# Patient Record
Sex: Male | Born: 2000 | Race: Black or African American | Hispanic: No | Marital: Single | Smoking: Never smoker
Health system: Southern US, Community
[De-identification: ages and names within clinical notes are randomized; demographics above are authoritative.]

## PROBLEM LIST (undated history)

## (undated) VITALS — BP 128/73 | HR 80 | Temp 98.5°F | Resp 16 | Ht 70.0 in | Wt 214.0 lb

## (undated) DIAGNOSIS — F209 Schizophrenia, unspecified: Secondary | ICD-10-CM

## (undated) DIAGNOSIS — R011 Cardiac murmur, unspecified: Secondary | ICD-10-CM

## (undated) HISTORY — DX: Cardiac murmur, unspecified: R01.1

---
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
## 2019-08-06 ENCOUNTER — Emergency Department (HOSPITAL_COMMUNITY)
Admission: EM | Admit: 2019-08-06 | Discharge: 2019-08-08 | Disposition: A | Discharge status: Other Acute Inpatient Hospital | Payer: BC Managed Care – PPO | Attending: Emergency Medicine | Admitting: Emergency Medicine

## 2019-08-06 DIAGNOSIS — Z20828 Contact with and (suspected) exposure to other viral communicable diseases: Secondary | ICD-10-CM | POA: Insufficient documentation

## 2019-08-06 DIAGNOSIS — F3113 Bipolar disorder, current episode manic without psychotic features, severe: Secondary | ICD-10-CM | POA: Insufficient documentation

## 2019-08-06 DIAGNOSIS — R4689 Other symptoms and signs involving appearance and behavior: Secondary | ICD-10-CM | POA: Diagnosis not present

## 2019-08-06 DIAGNOSIS — Z79899 Other long term (current) drug therapy: Secondary | ICD-10-CM | POA: Insufficient documentation

## 2019-08-06 DIAGNOSIS — R45850 Homicidal ideations: Secondary | ICD-10-CM | POA: Diagnosis not present

## 2019-08-06 DIAGNOSIS — R456 Violent behavior: Secondary | ICD-10-CM | POA: Diagnosis present

## 2019-08-06 DIAGNOSIS — Z046 Encounter for general psychiatric examination, requested by authority: Secondary | ICD-10-CM | POA: Insufficient documentation

## 2019-08-06 DIAGNOSIS — F3181 Bipolar II disorder: Secondary | ICD-10-CM | POA: Diagnosis not present

## 2019-08-06 MED ORDER — ACETAMINOPHEN 325 MG PO TABS: 650.0000 mg | ORAL_TABLET | Freq: Four times a day (QID) | ORAL | Status: DC | PRN

## 2019-08-06 MED ORDER — LORAZEPAM 1 MG PO TABS
1.0000 mg | ORAL_TABLET | ORAL | Status: DC | PRN
  Administered 2019-08-07 – 2019-08-08 (×2): 1 mg via ORAL
  Filled 2019-08-06 (×2): qty 1

## 2019-08-06 MED ORDER — LORAZEPAM 2 MG/ML IJ SOLN
2.0000 mg | Freq: Once | INTRAMUSCULAR | Status: AC
  Administered 2019-08-06: 19:00:00 2 mg via INTRAMUSCULAR
  Filled 2019-08-06: qty 1

## 2019-08-06 MED ORDER — LORAZEPAM 2 MG/ML IJ SOLN: 1.0000 mg | Freq: Once | INTRAMUSCULAR | Status: DC
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
## 2019-08-07 DIAGNOSIS — F3181 Bipolar II disorder: Secondary | ICD-10-CM | POA: Diagnosis present

## 2019-08-07 LAB — CBC WITH DIFFERENTIAL/PLATELET
Abs Immature Granulocytes: 0 10*3/uL (ref 0.00–0.07)
Basophils Absolute: 0.1 10*3/uL (ref 0.0–0.1)
Basophils Relative: 1 %
Eosinophils Absolute: 0.2 10*3/uL (ref 0.0–0.5)
Eosinophils Relative: 5 %
HCT: 48 % (ref 39.0–52.0)
Hemoglobin: 15.4 g/dL (ref 13.0–17.0)
Immature Granulocytes: 0 %
Lymphocytes Relative: 42 %
Lymphs Abs: 2.1 10*3/uL (ref 0.7–4.0)
MCH: 30.3 pg (ref 26.0–34.0)
MCHC: 32.1 g/dL (ref 30.0–36.0)
MCV: 94.3 fL (ref 80.0–100.0)
Monocytes Absolute: 0.4 10*3/uL (ref 0.1–1.0)
Monocytes Relative: 9 %
Neutro Abs: 2.2 10*3/uL (ref 1.7–7.7)
Neutrophils Relative %: 43 %
Platelets: 191 10*3/uL (ref 150–400)
RBC: 5.09 MIL/uL (ref 4.22–5.81)
RDW: 12.3 % (ref 11.5–15.5)
WBC: 5 10*3/uL (ref 4.0–10.5)
nRBC: 0 % (ref 0.0–0.2)

## 2019-08-07 LAB — COMPREHENSIVE METABOLIC PANEL
ALT: 11 U/L (ref 0–44)
AST: 22 U/L (ref 15–41)
Albumin: 4.5 g/dL (ref 3.5–5.0)
Alkaline Phosphatase: 76 U/L (ref 38–126)
Anion gap: 6 (ref 5–15)
BUN: 9 mg/dL (ref 6–20)
CO2: 27 mmol/L (ref 22–32)
Calcium: 9.3 mg/dL (ref 8.9–10.3)
Chloride: 106 mmol/L (ref 98–111)
Creatinine, Ser: 0.87 mg/dL (ref 0.61–1.24)
GFR calc Af Amer: >60 mL/min (ref >60)
GFR calc non Af Amer: >60 mL/min (ref >60)
Glucose, Bld: 84 mg/dL (ref 70–99)
Potassium: 4 mmol/L (ref 3.5–5.1)
Sodium: 139 mmol/L (ref 135–145)
Total Bilirubin: 0.3 mg/dL (ref 0.3–1.2)
Total Protein: 7.2 g/dL (ref 6.5–8.1)

## 2019-08-07 LAB — SARS CORONAVIRUS 2 BY RT PCR (HOSPITAL ORDER, PERFORMED IN ~~LOC~~ HOSPITAL LAB): SARS Coronavirus 2: NEGATIVE

## 2019-08-07 LAB — RAPID URINE DRUG SCREEN, HOSP PERFORMED
Amphetamines: NOT DETECTED
Barbiturates: NOT DETECTED
Benzodiazepines: POSITIVE — AB
Cocaine: NOT DETECTED
Opiates: NOT DETECTED
Tetrahydrocannabinol: NOT DETECTED

## 2019-08-07 LAB — ETHANOL: Alcohol, Ethyl (B): <10 mg/dL (ref <10)

## 2019-08-07 NOTE — ED Notes (Signed)
Pt calmer, more relaxed, speech less pressured.

## 2019-08-07 NOTE — ED Notes (Signed)
Up in room pacing, agreed to take ativan to help him relax

## 2019-08-07 NOTE — ED Notes (Addendum)
Watching tv, nad, sitter present

## 2019-08-07 NOTE — ED Notes (Signed)
Pt was transferred to TCU due to low census.

## 2019-08-07 NOTE — ED Notes (Signed)
Pt ambulatory w/o difficulty to room 35, wanded PTA, blongings in locker 35

## 2019-08-07 NOTE — ED Notes (Signed)
Report on Situation, Background, Assessment, and Recommendations received from [place], [place]. Patient alert and in no visible distress. Patient made aware of 1:1 and security cameras for their safety. Patient instructed to come to me with needs or concerns.

## 2019-08-07 NOTE — ED Notes (Signed)
telepsych eval via
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
## 2019-08-07 NOTE — ED Notes (Signed)
Up to the bathroom 
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
## 2019-08-07 NOTE — Progress Notes (Signed)
Received Mario Thomas last PM at the change of shift with the sitter at the bedside. He was restless, intrusive and had a difficult time following directions.He was focused on going home and had a difficult understanding the IVC policy. He eventually drifted off to sleep and slept til 0300 hrs. He immediately resumed the same behavior before he went to sleep. He was given coloring pages with crayons and a crossword book. The items kept him occupied for briefs periods of time. His mother called last night and this morning.
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
## 2019-08-07 NOTE — ED Notes (Addendum)
Pt's mom contact number--Mesi [phone].

## 2019-08-07 NOTE — ED Notes (Signed)
Talking w/. [employer] officer 

## 2019-08-08 DIAGNOSIS — R4689 Other symptoms and signs involving appearance and behavior: Secondary | ICD-10-CM | POA: Insufficient documentation

## 2019-08-08 DIAGNOSIS — F3181 Bipolar II disorder: Secondary | ICD-10-CM

## 2019-08-08 NOTE — ED Notes (Signed)
Called report to [facility] and spoke with Games developer.  Called [facility] and requested transport. Left message because there was not an answer.

## 2019-08-08 NOTE — ED Notes (Signed)
Called report to [facility] and spoke to [employer]. Called [facility] for transport.

## 2019-08-08 NOTE — Progress Notes (Addendum)
Patient has been accepted at [facility] per [place]. He will be going to their [facility]. Accepting provider is Dr. Marylu Lund, MD. Number to call report is [phone]. Patient can arrive after 13:00. CSW notified Diane, RN regarding disposition.  Chalmers Guest. Guerry Bruin, MSW, [place] Work/Disposition Phone: [phone] Fax: [phone]

## 2019-08-08 NOTE — ED Notes (Signed)
Transported to [facility] by [facility]. One hospital bag of pt belongings given to the sheriff. Pt was calm and cooperative at the time of discharge.

## 2019-08-08 NOTE — ED Notes (Signed)
Sheriff called and said that transport will be here in about 1-1/2 hours.
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
## 2019-08-08 NOTE — Progress Notes (Signed)
Patient meets criteria for inpatient treatment. No appropriate or available beds at [facility]. CSW faxed referrals to the following facilities for review:  [facility]  [facility]  [facility]  [facility]   TTS will continue to seek bed placement.  Mario Thomas. Mario Thomas, MSW, [place] Work/Disposition Phone: [phone] Fax: [phone]

## 2019-08-08 NOTE — ED Notes (Signed)
Pt refused to have VS taken

## 2019-08-20 ENCOUNTER — Telehealth (HOSPITAL_COMMUNITY): Payer: Self-pay | Admitting: Professional

## 2019-11-27 ENCOUNTER — Encounter: Payer: Self-pay | Admitting: Family Medicine

## 2019-11-27 ENCOUNTER — Other Ambulatory Visit: Payer: Self-pay

## 2019-11-27 ENCOUNTER — Ambulatory Visit (INDEPENDENT_AMBULATORY_CARE_PROVIDER_SITE_OTHER): Payer: BC Managed Care – PPO | Admitting: Family Medicine

## 2019-11-27 VITALS — BP 138/82 | HR 72 | Temp 97.2°F | Ht 70.0 in | Wt 154.4 lb

## 2019-11-27 DIAGNOSIS — Z0001 Encounter for general adult medical examination with abnormal findings: Secondary | ICD-10-CM

## 2019-11-27 DIAGNOSIS — H6123 Impacted cerumen, bilateral: Secondary | ICD-10-CM

## 2019-11-27 DIAGNOSIS — Z23 Encounter for immunization: Secondary | ICD-10-CM

## 2019-11-27 DIAGNOSIS — Z00129 Encounter for routine child health examination without abnormal findings: Secondary | ICD-10-CM

## 2019-11-27 DIAGNOSIS — Z003 Encounter for examination for adolescent development state: Secondary | ICD-10-CM

## 2019-11-27 DIAGNOSIS — Z8659 Personal history of other mental and behavioral disorders: Secondary | ICD-10-CM

## 2019-11-27 NOTE — Patient Instructions (Addendum)
May use cotton ball soaked in mineral oil into ear 10-20 min per week if having recurrent ear wax accumulation. May use dilute hydrogen peroxide (equal parts this and water) and place a few drops into ear every 2 weeks to help break down wax buildup.   Preventive Care 34-19 Years Old, Male Preventive care refers to lifestyle choices and visits with your health care provider that can promote health and wellness. At this stage in your life, you may start seeing a primary care physician instead of a pediatrician. Your health care is now your responsibility. Preventive care for young adults includes:  A yearly physical exam. This is also called an annual wellness visit.  Regular dental and eye exams.  Immunizations.  Screening for certain conditions.  Healthy lifestyle choices, such as diet and exercise. What can I expect for my preventive care visit? Physical exam Your health care provider may check:  Height and weight. These may be used to calculate body mass index (BMI), which is a measurement that tells if you are at a healthy weight.  Heart rate and blood pressure.  Body temperature. Counseling Your health care provider may ask you questions about:  Past medical problems and family medical history.  Alcohol, tobacco, and drug use.  Home and relationship well-being.  Access to firearms.  Emotional well-being.  Diet, exercise, and sleep habits.  Sexual activity and sexual health. What immunizations do I need?  Influenza (flu) vaccine  This is recommended every year. Tetanus, diphtheria, and pertussis (Tdap) vaccine  You may need a Td booster every 10 years. Varicella (chickenpox) vaccine  You may need this vaccine if you have not already been vaccinated. Human papillomavirus (HPV) vaccine  If recommended by your health care provider, you may need three doses over 6 months. Measles, mumps, and rubella (MMR) vaccine  You may need at least one dose of MMR. You may  also need a second dose. Meningococcal conjugate (MenACWY) vaccine  One dose is recommended if you are 59-86 years old and a Market researcher living in a residence hall, or if you have one of several medical conditions. You may also need additional booster doses. Pneumococcal conjugate (PCV13) vaccine  You may need this if you have certain conditions and were not previously vaccinated. Pneumococcal polysaccharide (PPSV23) vaccine  You may need one or two doses if you smoke cigarettes or if you have certain conditions. Hepatitis A vaccine  You may need this if you have certain conditions or if you travel or work in places where you may be exposed to hepatitis A. Hepatitis B vaccine  You may need this if you have certain conditions or if you travel or work in places where you may be exposed to hepatitis B. Haemophilus influenzae type b (Hib) vaccine  You may need this if you have certain risk factors. You may receive vaccines as individual doses or as more than one vaccine together in one shot (combination vaccines). Talk with your health care provider about the risks and benefits of combination vaccines. What tests do I need? Blood tests  Lipid and cholesterol levels. These may be checked every 5 years starting at age 28.  Hepatitis C test.  Hepatitis B test. Screening  Genital exam to check for testicular cancer or hernias.  Sexually transmitted disease (STD) testing, if you are at risk. Other tests  Tuberculosis skin test.  Vision and hearing tests.  Skin exam. Follow these instructions at home: Eating and drinking   Eat a  diet that includes fresh fruits and vegetables, whole grains, lean protein, and low-fat dairy products.  Drink enough fluid to keep your urine pale yellow.  Do not drink alcohol if: ? Your health care provider tells you not to drink. ? You are under the legal drinking age. In the [place] the legal drinking age is 42.  If you drink  alcohol: ? Limit how much you have to 0-2 drinks a day. ? Be aware of how much alcohol is in your drink. In the [place] one drink equals one 12 oz bottle of beer (355 mL), one 5 oz glass of wine (148 mL), or one 1 oz glass of hard liquor (44 mL). Lifestyle  Take daily care of your teeth and gums.  Stay active. Exercise at least 30 minutes 5 or more days of the week.  Do not use any products that contain nicotine or tobacco, such as cigarettes, e-cigarettes, and chewing tobacco. If you need help quitting, ask your health care provider.  Do not use drugs.  If you are sexually active, practice safe sex. Use a condom or other form of protection to prevent STIs (sexually transmitted infections).  Find healthy ways to cope with stress, such as: ? Meditation, yoga, or listening to music. ? Journaling. ? Talking to a trusted person. ? Spending time with friends and family. Safety  Always wear your seat belt while driving or riding in a vehicle.  Do not drive if you have been drinking alcohol.  Do not ride with someone who has been drinking.  Do not drive when you are tired or distracted.  Do not text while driving.  Wear a helmet and other protective equipment during sports activities.  If you have firearms in your house, make sure you follow all gun safety procedures.  Seek help if you have been bullied, physically abused, or sexually abused.  Use the Internet responsibly to avoid dangers such as online bullying and online sex predators. What's next?  Go to your health care provider once a year for a well check visit.  Ask your health care provider how often you should have your eyes and teeth checked.  Stay up to date on all vaccines. This information is not intended to replace advice given to you by your health care provider. Make sure you discuss any questions you have with your health care provider. Document Released: 04/12/2016 Document Revised: 11/20/2018 Document  Reviewed: 11/20/2018 Elsevier Patient Education  2020 [employer].

## 2019-11-27 NOTE — Progress Notes (Signed)
Subjective:    Patient ID: Mario Thomas, male    DOB: [birth year], 18 y.o.   MRN: [id number]  HPI This is a 18 yo male who presents today for annual wellness visit. Needs paperwork completed for [facility]. Will be living on campus. Living at home. Things are going ok. Exercising at home more, reading more.  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  Last CPE- 08/04/2018 Tdap- 04/04/2012 Flu- today Dental- last year Eye- yes Exercise- as above Sexuality- never sexually active  Mood- better, watching movies, reading, journaling about goals.  Reports that he is getting along well with his parents.  Denies using any recreational drugs, tobacco products, alcohol.  He is previously on Lamictal and hydroxyzine following behavioral health admission.  He reports that he has come off all of his medications and that his mood has been good.  Clicking in jaws with drinking water.   Some tingling sensation with lifting weights. Most in upper body, across back. No weakness. Goes away with stretching.     Past Medical History:  Diagnosis Date  . Heart murmur    History reviewed. No pertinent surgical history. History reviewed. No pertinent family history. Social History   Tobacco Use  . Smoking status: Never Smoker  . Smokeless tobacco: Never Used  Substance Use Topics  . Alcohol use: Never  . Drug use: Never     Review of Systems  Constitutional: Negative.   HENT:       Per HPI, clicking along jaw line with drinking water. No pain.   Eyes: Negative.   Respiratory: Negative.   Endocrine: Negative.   Genitourinary: Negative.   Musculoskeletal: Positive for myalgias (see hpi).  Skin: Negative.   Allergic/Immunologic: Negative.   Neurological: Negative.   Hematological: Negative.   Psychiatric/Behavioral: Negative.        Objective:   Physical Exam Vitals reviewed.  Constitutional:      General: He is not in acute distress.    Appearance: Normal appearance. He  is normal weight. He is not ill-appearing, toxic-appearing or diaphoretic.  HENT:     Head: Normocephalic and atraumatic.     Comments: No abnormality of TMJ joint or cervical lymphadenopathy.  No crepitus with opening of mouth.    Right Ear: Tympanic membrane, ear canal and external ear normal.     Left Ear: Tympanic membrane, ear canal and external ear normal.     Ears:     Comments: Moderate amounts of cerumen bilaterally.    Nose: Nose normal.     Mouth/Throat:     Mouth: Mucous membranes are moist.     Pharynx: Oropharynx is clear.  Eyes:     Conjunctiva/sclera: Conjunctivae normal.  Cardiovascular:     Rate and Rhythm: Normal rate.     Heart sounds: Normal heart sounds.  Pulmonary:     Effort: Pulmonary effort is normal.     Breath sounds: Normal breath sounds.  Abdominal:     General: Abdomen is flat. Bowel sounds are normal. There is no distension.     Palpations: Abdomen is soft. There is no mass.     Tenderness: There is no abdominal tenderness. There is no guarding or rebound.     Hernia: No hernia is present.  Musculoskeletal:        General: No swelling, tenderness, deformity or signs of injury. Normal range of motion.     Right lower leg: No edema.     Left lower leg:  No edema.  Skin:    General: Skin is warm and dry.  Neurological:     Mental Status: He is alert and oriented to person, place, and time.  Psychiatric:        Mood and Affect: Mood normal.        Behavior: Behavior normal.        Thought Content: Thought content normal.        Judgment: Judgment normal.       BP 138/82 (BP Location: Left Arm, Patient Position: Sitting, Cuff Size: Normal)   Pulse 72   Temp (!) 97.2 F (36.2 C) (Temporal)   Ht 5\' 10"  (1.778 m)   Wt 154 lb 6.4 oz (70 kg)   SpO2 98%   BMI 22.15 kg/m  Wt Readings from Last 3 Encounters:  11/27/19 154 lb 6.4 oz (70 kg) (52 %, Z= 0.05)*  08/22/18 145 lb 11.6 oz (66.1 kg) (46 %, Z= -0.09)*  08/04/18 144 lb 8 oz (65.5 kg) (45  %, Z= -0.14)*   * Growth percentiles are based on CDC (Boys, 2-20 Years) data.   Depression screen [facility] 2/9 11/27/2019 08/04/2018  Decreased Interest 0 0  Down, Depressed, Hopeless 0 0  PHQ - 2 Score 0 0       Assessment & Plan:  1. Well adolescent visit - Discussed and encouraged healthy lifestyle choices- adequate sleep, regular exercise, stress management and healthy food choices.    2. Need for influenza vaccination - Flu Vaccine QUAD 6+ mos PF IM (Fluarix Quad PF)  3. Excessive cerumen in both ear canals - provided information regarding measures to soften and remove cerumen  4. History of behavioral and mental health problems -Patient has come off his medication and reports that his mood has been stable.  Discussed resources for him if he feels like his mood deteriorates or he has difficulty sleeping or coping.  This visit occurred during the SARS-CoV-2 public health emergency.  Safety protocols were in place, including screening questions prior to the visit, additional usage of staff PPE, and extensive cleaning of exam room while observing appropriate contact time as indicated for disinfecting solutions.    08/06/2018, FNP-BC  [facility], [facility]  11/27/2019 5:32 PM

## 2021-01-16 ENCOUNTER — Encounter: Payer: BC Managed Care – PPO | Admitting: Family Medicine

## 2021-01-23 ENCOUNTER — Encounter: Payer: Self-pay | Admitting: Family Medicine

## 2021-01-23 ENCOUNTER — Other Ambulatory Visit: Payer: Self-pay

## 2021-01-23 ENCOUNTER — Ambulatory Visit (INDEPENDENT_AMBULATORY_CARE_PROVIDER_SITE_OTHER): Payer: 59 | Admitting: Family Medicine

## 2021-01-23 VITALS — BP 118/70 | HR 68 | Temp 98.7°F | Ht 70.0 in | Wt 179.8 lb

## 2021-01-23 DIAGNOSIS — Z23 Encounter for immunization: Secondary | ICD-10-CM | POA: Diagnosis not present

## 2021-01-23 DIAGNOSIS — Z0000 Encounter for general adult medical examination without abnormal findings: Secondary | ICD-10-CM

## 2021-01-23 NOTE — Progress Notes (Signed)
Annual Exam   Chief Complaint:  Chief Complaint  Patient presents with  . Transitions Of Care    History of Present Illness:  Mario Thomas is a 20 y.o. presents today for annual examination.    #Abnormal throat sound - feels he sounds like a hum - unable to reproduce the sound - daily occurrences - about 20 times per day - occurring at random times - has not been able to find any triggers  Nutrition/Lifestyle Exercise: daily running  Diet: meat, veggies, grains He is not sexually active.   Social History   Tobacco Use  Smoking Status Never Smoker  Smokeless Tobacco Never Used   Social History   Substance and Sexual Activity  Alcohol Use Never   Social History   Substance and Sexual Activity  Drug Use Never     Safety The patient wears seatbelts: yes.     The patient feels safe at home and in their relationships: yes.  General Health Dentist in the last year: No Eye doctor: not applicable  Weight Wt Readings from Last 3 Encounters:  01/23/21 179 lb 12 oz (81.5 kg)  11/27/19 154 lb 6.4 oz (70 kg) (52 %, Z= 0.05)*  08/22/18 145 lb 11.6 oz (66.1 kg) (46 %, Z= -0.09)*   * Growth percentiles are based on CDC (Boys, 2-20 Years) data.   Patient has normal BMI  BMI Readings from Last 1 Encounters:  01/23/21 25.79 kg/m     Chronic disease screening Blood pressure monitoring:  BP Readings from Last 3 Encounters:  01/23/21 118/70  11/27/19 138/82  08/08/19 134/75    Lipid Monitoring: Indication for screening: age >35, obesity, diabetes, family hx, CV risk factors.  Lipid screening: No  Lab Results  Component Value Date   CHOL 113 08/24/2018   HDL 47 08/24/2018   LDLCALC 58 08/24/2018   TRIG 41 08/24/2018   CHOLHDL 2.4 08/24/2018     Diabetes Screening: age >59, overweight, family hx, PCOS, hx of gestational diabetes, at risk ethnicity, elevated blood pressure >135/80.  Diabetes Screening screening: No  Lab Results  Component Value Date    HGBA1C 5.0 08/24/2018     Immunization History  Administered Date(s) Administered  . HPV Quadrivalent 04/04/2012, 05/09/2012  . Hepatitis A, Ped/Adol-2 Dose 09/23/2009, 05/25/2010  . Hepatitis B, ped/adol 09/23/2009, 10/31/2009, 05/25/2010  . IPV 05/02/2009, 09/23/2009, 10/31/2009  . Influenza Split 10/31/2009, 01/18/2012  . Influenza,inj,Quad PF,6+ Mos 01/18/2012, 11/27/2019  . MMR 05/02/2009, 12/21/2009  . Meningococcal B, OMV 08/04/2018  . Meningococcal Polysaccharide 04/04/2012  . Td 10/31/2009, 12/21/2009, 06/30/2010  . Tdap 04/04/2012  . Varicella 09/23/2009, 12/21/2009    Past Medical History:  Diagnosis Date  . Heart murmur   . 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 No pertinent surgical history.  Prior to Admission medications   Not on File    No Known Allergies   Social History   Socioeconomic History  . Marital status: Single    Spouse name: Not on file  . Number of children: Not on file  . Years of education: Not on file  . Highest education level: Not on file  Occupational History  . Not on file  Tobacco Use  . Smoking status: Never Smoker  . Smokeless tobacco: Never Used  Vaping Use  . Vaping Use: Never used  Substance and Sexual Activity  . Alcohol use: Never  . Drug use: Never  . Sexual activity: Never  Other Topics Concern  .  Not on file  Social History Narrative   01/23/21   From: the area   Living: with parents   Work: Primary school teacher: Estate manager/land agent at [employer]: good relationship with parents, 1 sibling      Enjoys: running      Exercise: daily running    Diet: meat, veggies, grains      Safety   Seat belts: Yes    Guns: No   Safe in relationships: Yes    Social Determinants of Radio broadcast assistant Strain: Not on file  Food Insecurity: Not on file  Transportation Needs: Not on file  Physical Activity: Not on file  Stress: Not on file  Social Connections:  Not on file  Intimate Partner Violence: Not on file    Family History  Problem Relation Age of Onset  . Hyperlipidemia Father     Review of Systems  Constitutional: Negative for chills and fever.  HENT: Negative for congestion and sore throat.   Eyes: Negative for blurred vision and double vision.  Respiratory: Negative for shortness of breath.   Cardiovascular: Negative for chest pain.  Gastrointestinal: Negative for heartburn, nausea and vomiting.  Genitourinary: Negative.   Musculoskeletal: Negative.  Negative for myalgias.  Skin: Negative for rash.  Neurological: Negative for dizziness and headaches.  Endo/Heme/Allergies: Does not bruise/bleed easily.  Psychiatric/Behavioral: Negative for depression. The patient is not nervous/anxious.      Physical Exam BP 118/70   Pulse 68   Temp 98.7 F (37.1 C) (Temporal)   Ht 5' 10" (1.778 m)   Wt 179 lb 12 oz (81.5 kg)   SpO2 99%   BMI 25.79 kg/m    BP Readings from Last 3 Encounters:  01/23/21 118/70  11/27/19 138/82  08/08/19 134/75      Physical Exam Constitutional:      General: He is not in acute distress.    Appearance: He is well-developed and well-nourished. He is not diaphoretic.  HENT:     Head: Normocephalic and atraumatic.     Right Ear: Tympanic membrane and ear canal normal.     Left Ear: Tympanic membrane and ear canal normal.     Nose: Nose normal.     Mouth/Throat:     Mouth: Oropharynx is clear and moist.     Pharynx: Uvula midline.  Eyes:     General: No scleral icterus.    Extraocular Movements: EOM normal.     Conjunctiva/sclera: Conjunctivae normal.     Pupils: Pupils are equal, round, and reactive to light.  Cardiovascular:     Rate and Rhythm: Normal rate and regular rhythm.     Heart sounds: Murmur heard.    Pulmonary:     Effort: Pulmonary effort is normal. No respiratory distress.     Breath sounds: Normal breath sounds. No wheezing.  Abdominal:     General: Bowel sounds are  normal. There is no distension.     Palpations: Abdomen is soft. There is no mass.     Tenderness: There is no abdominal tenderness. There is no guarding.  Musculoskeletal:        General: Normal range of motion.     Cervical back: Normal range of motion and neck supple.  Lymphadenopathy:     Cervical: No cervical adenopathy.  Skin:    General: Skin is warm and dry.     Capillary Refill: Capillary refill takes less than 2 seconds.  Neurological:  Mental Status: He is alert and oriented to person, place, and time.  Psychiatric:        Mood and Affect: Mood and affect normal.        Results:  PHQ-9:   Depression screen [facility] 2/9 11/27/2019 08/04/2018  Decreased Interest 0 0  Down, Depressed, Hopeless 0 0  PHQ - 2 Score 0 0       Assessment: 20 y.o. here for routine annual physical examination.  Plan: Problem List Items Addressed This Visit   None     Screening: -- Blood pressure screen normal -- cholesterol screening: not due for screening -- Weight screening: normal -- Diabetes Screening: not due for screening -- Nutrition: Encouraged healthy diet and exercise  The ASCVD Risk score Mikey Bussing DC Jr., et al., 2013) failed to calculate for the following reasons:   The 2013 ASCVD risk score is only valid for ages 23 to 15  -- Statin therapy for Age 79-75 with CVD risk >7.5%  Psych -- Depression screening (PHQ-9):    Safety -- tobacco screening: not using -- alcohol screening:  low-risk usage. -- no evidence of domestic violence or intimate partner violence.   Cancer Screening -- No age related cancer screening due  Immunizations Immunization History  Administered Date(s) Administered  . HPV Quadrivalent 04/04/2012, 05/09/2012  . Hepatitis A, Ped/Adol-2 Dose 09/23/2009, 05/25/2010  . Hepatitis B, ped/adol 09/23/2009, 10/31/2009, 05/25/2010  . IPV 05/02/2009, 09/23/2009, 10/31/2009  . Influenza Split 10/31/2009, 01/18/2012  . Influenza,inj,Quad PF,6+ Mos  01/18/2012, 11/27/2019  . MMR 05/02/2009, 12/21/2009  . Meningococcal B, OMV 08/04/2018  . Meningococcal Polysaccharide 04/04/2012  . Td 10/31/2009, 12/21/2009, 06/30/2010  . Tdap 04/04/2012  . Varicella 09/23/2009, 12/21/2009    -- flu vaccine up to date -- TDAP q10 years up to date -- Covid-19 Vaccine up to date   Encouraged regular vision and dental screening. Encouraged healthy exercise and diet.   Lesleigh Noe

## 2021-01-23 NOTE — Patient Instructions (Addendum)
Hum symptom: could try allergy pill - zyrtec, Claritin - store brand OK for 1-2 weeks to see if it helps

## 2021-11-06 ENCOUNTER — Ambulatory Visit (HOSPITAL_COMMUNITY)
Admission: EM | Admit: 2021-11-06 | Discharge: 2021-11-06 | Disposition: A | Discharge status: Home / Self Care | Payer: 59 | Attending: Registered Nurse | Admitting: Registered Nurse

## 2021-11-06 ENCOUNTER — Encounter (HOSPITAL_COMMUNITY): Payer: Self-pay | Admitting: Registered Nurse

## 2021-11-06 DIAGNOSIS — Z046 Encounter for general psychiatric examination, requested by authority: Secondary | ICD-10-CM

## 2021-11-06 DIAGNOSIS — F3181 Bipolar II disorder: Secondary | ICD-10-CM | POA: Diagnosis present
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
## 2021-11-06 NOTE — Progress Notes (Signed)
AVS reviewed with patient and all questions answered.  Patient verbalized understanding of information presented.  Patient requesting ride to [facility] on [address] where he said he's been staying.  Safe transport here to transport patient home.  All belongings returned and belongings sheet signed.  Patient discharged in stable condition; no acute distress noted.

## 2021-11-06 NOTE — ED Provider Notes (Signed)
Behavioral Health Urgent Care Medical Screening Exam  Patient Name: Mario Thomas MRN: [id number] Date of Evaluation: 11/06/21 Chief Complaint:   Diagnosis:  Final diagnoses:  Involuntary commitment  Bipolar 2 disorder ([place])    History of Present illness: Mario Thomas is a 20 y.o. male. patient presented to [facility] as a walk in via law enforcement under IVC petition by his father.  Per IVC "Respondent has been previously diagnosed with bipolar , schizophrenia.  He has medication but is non-compliant with his medication regimen.  He has a his tory of mental commitments most recently in 2020.  According to family, respondent had been actin erratically, spending all of his money, staying in expensive hotel even though he has a home to stay at.  He is spending all of his money as he believes that he will win the lottery playing power ball.  He has been driving his parents vehicle without their permission and he has lost his wallet and cell phone so there Is no means of communicating with him.  Parents are concerned for his well being as he continues to act erratically and refuses to take his mediations.  Respondent has been telling his parents that he hopes his actions will cause parents to get fired from their jobs."    Mario Thomas, 20 y.o., male patient seen face to face by this provider, consulted with Dr. Ernie Hew; and chart reviewed on 11/06/21.  On evaluation Mario Thomas reports he feels fine.  Patient denies suicidal/self-harm/homicidal ideation, psychosis, and paranoia.  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  States he has been staying in a motel "I work hard for my money and I just wanted to be somewhere to relax."  Patient states he has stopped taking his psychotropic medications "I felt better and didn't feel that I needed them."  Patient states since he has stopped taking him medication over a month ago he feels even better.  Patient states he is eating and sleeping  without difficulty.  He denies racing/intrusive thought, and mood swings.  Patient also denies substance use.  Patient has no outpatient psychiatric services and that he was getting psychotropic medications prescribed by his primary care provider.  Reports that he does live with parents but they try to control him and sometimes he just wants to be alone to relax."  At this time patient is not interested in outpatient psychiatric services.  Patient gave permission to speak to his mother or father for collateral information but was unable to give the phone numbers an doesn't have his cell phone to obtain the numbers.   During evaluation Mario Thomas is sitting up right in chair in no acute distress.  He is alert/oriented x 4; calm/cooperative; and mood congruent with affect.  He is speaking in a clear tone at moderate volume, and normal pace; with good eye contact.  His thought process is coherent and relevant; There is no indication that he is currently responding to internal/external stimuli or experiencing delusional thought content; and he has denied suicidal/self-harm/homicidal ideation, psychosis, and paranoia.  Patient has remained calm throughout assessment and has answered questions appropriately.    Collateral Information:  Spoke to patients mother and father (Mario Thomas and Mario Thomas).  Both report that patient has been staying in a motel.  "5 days prior to Thanksgiving.  He came home Thanksgiving day and then went back to motel.  Reports he was paying for motel with his own money  until he lost his wallet with debt card and now he is asking [place] for money."  States that the patient has stopped taking his medications and needs to get back on them.  Reports that the patient hasn't been a danger to himself or anyone else but he is not making logical decisions.  Informed could not force patient to take his medications.  Although staying in a motel room when have a home to stay at is not logical but not  grounds for inpatient psychiatric treatment.        Demographic Factors:  Male  Loss Factors: None  Historical Factors: Prior psychiatric treatment  Risk Reduction Factors:   Sense of responsibility to family, Religious beliefs about death, Employed, Living with another person, especially a relative, and Positive social support  Continued Clinical Symptoms:  Previous Psychiatric Diagnoses and Treatments  Cognitive Features That Contribute To Risk:  None    Suicide Risk:  Minimal: No identifiable suicidal ideation.  Patients presenting with no risk factors but with morbid ruminations; may be classified as minimal risk based on the severity of the depressive symptoms  Rescind IVC: After thorough evaluation and review of information currently presented on assessment of Mario Thomas, there is insufficient findings to indicate patient meets criteria for involuntary commitment or require an inpatient level of care. Mario Thomas is alert/oriented x 4; calm/cooperative; and mood congruent with affect.  He is speaking in a clear tone at moderate volume at a normal pace, with good eye contact.  His thought process is coherent and relevant; There is no indication that he is currently responding to internal/external stimuli or experiencing delusional thought content; and he has denied suicidal/self-harm/homicidal ideation, psychosis, and paranoia.  He has remained calm throughout assessment and has answered questions appropriately.  At this time, He is not significantly impaired, psychotic, or manic on exam.  A detailed risk assessment has been completed based on clinical exam and individual risk factors.  Patient acute suicide risk is low.  Psychiatric Specialty Exam: Physical Exam Vitals and nursing note reviewed. Exam conducted with a chaperone present.  Constitutional:      General: He is not in acute distress.    Appearance: Normal appearance. He is not ill-appearing.  Cardiovascular:     Rate  and Rhythm: Normal rate.  Pulmonary:     Effort: Pulmonary effort is normal.  Musculoskeletal:        General: Normal range of motion.     Cervical back: Normal range of motion.  Skin:    General: Skin is warm and dry.  Neurological:     Mental Status: He is alert and oriented to person, place, and time.  Psychiatric:        Attention and Perception: Attention and perception normal. He does not perceive auditory or visual hallucinations.        Mood and Affect: Mood and affect normal.        Speech: Speech normal.        Behavior: Behavior normal. Behavior is cooperative.        Thought Content: Thought content normal. Thought content is not paranoid or delusional. Thought content does not include homicidal or suicidal ideation.        Cognition and Memory: Cognition and memory normal.        Judgment: Judgment normal.    Review of Systems  Constitutional: Negative.   HENT: Negative.    Eyes: Negative.   Respiratory: Negative.    Cardiovascular: Negative.  Gastrointestinal: Negative.   Genitourinary: Negative.   Musculoskeletal: Negative.   Skin: Negative.   Neurological: Negative.   Psychiatric/Behavioral:  Depression: Denies. Hallucinations: Denies. Substance abuse: Denies. Suicidal ideas: Denies. Nervous/anxious: Denies. Insomnia: Denies.    Blood pressure 135/89, pulse 83, temperature 98.8 F (37.1 C), temperature source Oral, resp. rate 18, SpO2 100 %.There is no height or weight on file to calculate BMI.    Presentation  General Appearance:Appropriate for Environment; Casual  Eye Contact:Good  Speech:Clear and Coherent; Normal Rate  Speech Volume:Normal  Handedness:Right   Mood and Affect  Mood:Euthymic  Affect:Appropriate; Congruent   Thought Process  Thought Processes:Coherent; Goal Directed  Descriptions of Associations:Intact  Orientation:Full (Time, Place and Person)  Thought Content:Logical; WDL  Diagnosis of Schizophrenia or Schizoaffective  disorder in past: No   Hallucinations:None  Ideas of Reference:None  Suicidal Thoughts:No  Homicidal Thoughts:No   Sensorium  Memory:Immediate Good; Recent Good  Judgment:Intact  Insight:Present   Executive Functions  Concentration:No data recorded Attention Span:Good  [place]  Language:Good   Psychomotor Activity  Psychomotor Activity:Normal   Assets  Assets:Communication Skills; Desire for Improvement; Financial Resources/Insurance; Housing; Data processing manager; Transportation   Sleep  Sleep:Good  Number of hours: No data recorded  Nutritional Assessment (For OBS and FBC admissions only) Has the patient had a decrease in food intake/or appetite?: No Does the patient have dental problems?: No Does the patient have eating habits or behaviors that may be indicators of an eating disorder including binging or inducing vomiting?: No Has the patient recently lost weight without trying?: 0 Has the patient been eating poorly because of a decreased appetite?: 0 Malnutrition Screening Tool Score: 0    Physical Exam: Physical Exam Vitals and nursing note reviewed. Exam conducted with a chaperone present.  Constitutional:      General: He is not in acute distress.    Appearance: Normal appearance. He is not ill-appearing.  Cardiovascular:     Rate and Rhythm: Normal rate.  Pulmonary:     Effort: Pulmonary effort is normal.  Musculoskeletal:        General: Normal range of motion.     Cervical back: Normal range of motion.  Skin:    General: Skin is warm and dry.  Neurological:     Mental Status: He is alert and oriented to person, place, and time.  Psychiatric:        Attention and Perception: Attention and perception normal. He does not perceive auditory or visual hallucinations.        Mood and Affect: Mood and affect normal.        Speech: Speech normal.        Behavior: Behavior normal. Behavior is cooperative.        Thought  Content: Thought content normal. Thought content is not paranoid or delusional. Thought content does not include homicidal or suicidal ideation.        Cognition and Memory: Cognition and memory normal.        Judgment: Judgment normal.   Review of Systems  Constitutional: Negative.   HENT: Negative.    Eyes: Negative.   Respiratory: Negative.    Cardiovascular: Negative.   Gastrointestinal: Negative.   Genitourinary: Negative.   Musculoskeletal: Negative.   Skin: Negative.   Neurological: Negative.   Endo/Heme/Allergies: Negative.   Psychiatric/Behavioral:  Depression: Denies. Hallucinations: Denies. Substance abuse: Denies. Suicidal ideas: Denies. Nervous/anxious: Denies. Insomnia: Denies.   Blood pressure 135/89, pulse 83, temperature 98.8 F (37.1 C), temperature  source Oral, resp. rate 18, SpO2 100 %. There is no height or weight on file to calculate BMI.  Musculoskeletal: Strength & Muscle Tone: within normal limits Gait & Station: normal Patient leans: N/A   [facility] MSE Discharge Disposition for Follow up and Recommendations: Based on my evaluation the patient does not appear to have an emergency medical condition and can be discharged with resources and follow up care in outpatient services for Medication Management and Individual Therapy   Maddalynn Barnard, NP 11/06/2021, 2:42 PM
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
## 2021-11-08 ENCOUNTER — Other Ambulatory Visit: Payer: Self-pay

## 2021-11-08 ENCOUNTER — Encounter (HOSPITAL_COMMUNITY): Payer: Self-pay

## 2021-11-08 ENCOUNTER — Emergency Department (HOSPITAL_COMMUNITY)
Admission: EM | Admit: 2021-11-08 | Discharge: 2021-11-09 | Disposition: A | Discharge status: Other Acute Inpatient Hospital | Payer: 59 | Source: Home / Self Care | Attending: Emergency Medicine | Admitting: Emergency Medicine

## 2021-11-08 DIAGNOSIS — Z20822 Contact with and (suspected) exposure to covid-19: Secondary | ICD-10-CM | POA: Insufficient documentation

## 2021-11-08 DIAGNOSIS — F209 Schizophrenia, unspecified: Secondary | ICD-10-CM | POA: Insufficient documentation

## 2021-11-08 DIAGNOSIS — F332 Major depressive disorder, recurrent severe without psychotic features: Secondary | ICD-10-CM | POA: Insufficient documentation

## 2021-11-08 DIAGNOSIS — F312 Bipolar disorder, current episode manic severe with psychotic features: Secondary | ICD-10-CM | POA: Diagnosis not present

## 2021-11-08 DIAGNOSIS — R456 Violent behavior: Secondary | ICD-10-CM | POA: Insufficient documentation

## 2021-11-08 DIAGNOSIS — R4689 Other symptoms and signs involving appearance and behavior: Secondary | ICD-10-CM

## 2021-11-08 DIAGNOSIS — F319 Bipolar disorder, unspecified: Secondary | ICD-10-CM | POA: Insufficient documentation

## 2021-11-08 DIAGNOSIS — Z046 Encounter for general psychiatric examination, requested by authority: Secondary | ICD-10-CM | POA: Insufficient documentation

## 2021-11-08 DIAGNOSIS — R4182 Altered mental status, unspecified: Secondary | ICD-10-CM | POA: Insufficient documentation

## 2021-11-08 LAB — COMPREHENSIVE METABOLIC PANEL
ALT: 13 U/L (ref 0–44)
AST: 19 U/L (ref 15–41)
Albumin: 4.3 g/dL (ref 3.5–5.0)
Alkaline Phosphatase: 53 U/L (ref 38–126)
Anion gap: 7 (ref 5–15)
BUN: 16 mg/dL (ref 6–20)
CO2: 28 mmol/L (ref 22–32)
Calcium: 9.2 mg/dL (ref 8.9–10.3)
Chloride: 105 mmol/L (ref 98–111)
Creatinine, Ser: 0.67 mg/dL (ref 0.61–1.24)
GFR, Estimated: >60 mL/min (ref >60)
Glucose, Bld: 92 mg/dL (ref 70–99)
Potassium: 3.7 mmol/L (ref 3.5–5.1)
Sodium: 140 mmol/L (ref 135–145)
Total Bilirubin: 0.5 mg/dL (ref 0.3–1.2)
Total Protein: 7.4 g/dL (ref 6.5–8.1)

## 2021-11-08 LAB — CBC
HCT: 46.2 % (ref 39.0–52.0)
Hemoglobin: 15.3 g/dL (ref 13.0–17.0)
MCH: 30.3 pg (ref 26.0–34.0)
MCHC: 33.1 g/dL (ref 30.0–36.0)
MCV: 91.5 fL (ref 80.0–100.0)
Platelets: 181 10*3/uL (ref 150–400)
RBC: 5.05 MIL/uL (ref 4.22–5.81)
RDW: 12.2 % (ref 11.5–15.5)
WBC: 3.5 10*3/uL — ABNORMAL LOW (ref 4.0–10.5)
nRBC: 0 % (ref 0.0–0.2)

## 2021-11-08 LAB — SALICYLATE LEVEL: Salicylate Lvl: <7 mg/dL — ABNORMAL LOW (ref 7.0–30.0)

## 2021-11-08 LAB — ETHANOL: Alcohol, Ethyl (B): <10 mg/dL (ref <10)

## 2021-11-08 LAB — ACETAMINOPHEN LEVEL: Acetaminophen (Tylenol), Serum: <10 ug/mL — ABNORMAL LOW (ref 10–30)

## 2021-11-08 MED ORDER — LORAZEPAM 1 MG PO TABS
1.0000 mg | ORAL_TABLET | ORAL | Status: DC | PRN
  Administered 2021-11-09: 1 mg via ORAL
  Filled 2021-11-08 (×2): qty 1

## 2021-11-08 MED ORDER — ARIPIPRAZOLE 10 MG PO TABS
10.0000 mg | ORAL_TABLET | Freq: Every day | ORAL | Status: DC
  Filled 2021-11-08: qty 1

## 2021-11-08 MED ORDER — BENZTROPINE MESYLATE 1 MG PO TABS
1.0000 mg | ORAL_TABLET | Freq: Every day | ORAL | Status: DC
  Filled 2021-11-08: qty 1
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
## 2021-11-08 NOTE — ED Provider Notes (Signed)
Emergency Medicine Provider Triage Evaluation Note  Mario Thomas , a 20 y.o. male  was evaluated in triage.  Pt presents under IVC by his parents due to hallucinations and abnormal behavior.  History of bipolar, schizophrenia.  Review of Systems  Positive: None Negative: SI/HI, AVH  Physical Exam  BP 140/71 (BP Location: Left Arm)   Pulse 87   Temp 98.4 F (36.9 C) (Oral)   Resp 18   SpO2 94%  Gen:   Awake, no distress   Resp:  Normal effort  MSK:   Moves extremities without difficulty  Other:  Flat affect, responsive to questions.  Does not appear to be responding to internal stimuli.  Medical Decision Making  Medically screening exam initiated at 5:31 PM.  Appropriate orders placed.  Mario Thomas was informed that the remainder of the evaluation will be completed by another provider, this initial triage assessment does not replace that evaluation, and the importance of remaining in the ED until their evaluation is complete.     Saddie Benders, PA-C 11/08/21 1740    Charlynne Pander, MD 11/08/21 [id number]
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
## 2021-11-09 ENCOUNTER — Emergency Department (HOSPITAL_COMMUNITY): Payer: 59

## 2021-11-09 ENCOUNTER — Encounter (HOSPITAL_COMMUNITY): Payer: Self-pay | Admitting: Psychiatry

## 2021-11-09 ENCOUNTER — Inpatient Hospital Stay (HOSPITAL_COMMUNITY)
Admission: AD | Admit: 2021-11-09 | Discharge: 2021-11-17 | DRG: 885 | Disposition: A | Discharge status: Home / Self Care | Payer: 59 | Source: Intra-hospital | Attending: Emergency Medicine | Admitting: Emergency Medicine

## 2021-11-09 ENCOUNTER — Other Ambulatory Visit: Payer: Self-pay

## 2021-11-09 DIAGNOSIS — F312 Bipolar disorder, current episode manic severe with psychotic features: Principal | ICD-10-CM | POA: Diagnosis present

## 2021-11-09 DIAGNOSIS — Z79899 Other long term (current) drug therapy: Secondary | ICD-10-CM

## 2021-11-09 DIAGNOSIS — F319 Bipolar disorder, unspecified: Secondary | ICD-10-CM | POA: Diagnosis present

## 2021-11-09 DIAGNOSIS — Z20822 Contact with and (suspected) exposure to covid-19: Secondary | ICD-10-CM | POA: Diagnosis present

## 2021-11-09 LAB — RAPID URINE DRUG SCREEN, HOSP PERFORMED
Amphetamines: NOT DETECTED
Barbiturates: NOT DETECTED
Benzodiazepines: NOT DETECTED
Cocaine: NOT DETECTED
Opiates: NOT DETECTED
Tetrahydrocannabinol: NOT DETECTED

## 2021-11-09 LAB — RESP PANEL BY RT-PCR (FLU A&B, COVID) ARPGX2
Influenza A by PCR: NEGATIVE
Influenza B by PCR: NEGATIVE
SARS Coronavirus 2 by RT PCR: NEGATIVE

## 2021-11-09 MED ORDER — LORAZEPAM 1 MG PO TABS: 1.0000 mg | ORAL_TABLET | ORAL | Status: DC | PRN

## 2021-11-09 MED ORDER — BENZTROPINE MESYLATE 1 MG PO TABS
1.0000 mg | ORAL_TABLET | Freq: Every day | ORAL | Status: DC
  Administered 2021-11-09 – 2021-11-16 (×8): 1 mg via ORAL
  Filled 2021-11-09 (×10): qty 1

## 2021-11-09 MED ORDER — ZIPRASIDONE MESYLATE 20 MG IM SOLR: 20.0000 mg | Freq: Four times a day (QID) | INTRAMUSCULAR | Status: DC | PRN

## 2021-11-09 MED ORDER — ACETAMINOPHEN 325 MG PO TABS: 650.0000 mg | ORAL_TABLET | Freq: Four times a day (QID) | ORAL | Status: DC | PRN

## 2021-11-09 MED ORDER — ALUM & MAG HYDROXIDE-SIMETH 200-200-20 MG/5ML PO SUSP: 30.0000 mL | ORAL | Status: DC | PRN

## 2021-11-09 MED ORDER — ARIPIPRAZOLE 10 MG PO TABS
10.0000 mg | ORAL_TABLET | Freq: Every day | ORAL | Status: DC
  Administered 2021-11-09 – 2021-11-10 (×2): 10 mg via ORAL
  Filled 2021-11-09 (×5): qty 1

## 2021-11-09 MED ORDER — RISPERIDONE 2 MG PO TBDP: 2.0000 mg | ORAL_TABLET | Freq: Three times a day (TID) | ORAL | Status: DC | PRN

## 2021-11-09 MED ORDER — MAGNESIUM HYDROXIDE 400 MG/5ML PO SUSP: 30.0000 mL | Freq: Every day | ORAL | Status: DC | PRN

## 2021-11-09 MED ORDER — HYDROXYZINE HCL 25 MG PO TABS: 25.0000 mg | ORAL_TABLET | Freq: Three times a day (TID) | ORAL | Status: DC | PRN

## 2021-11-09 MED ORDER — ZIPRASIDONE MESYLATE 20 MG IM SOLR: 20.0000 mg | INTRAMUSCULAR | Status: DC | PRN

## 2021-11-09 MED ORDER — TRAZODONE HCL 50 MG PO TABS
50.0000 mg | ORAL_TABLET | Freq: Every evening | ORAL | Status: DC | PRN
  Administered 2021-11-09 – 2021-11-16 (×8): 50 mg via ORAL
  Filled 2021-11-09 (×8): qty 1

## 2021-11-09 NOTE — ED Notes (Signed)
Patient refused medications stating, "I'm not taking that."

## 2021-11-09 NOTE — Progress Notes (Signed)
Pt kept to himself much of the evening    11/09/21 2300  Psych Admission Type (Psych Patients Only)  Admission Status Involuntary  Psychosocial Assessment  Patient Complaints Anxiety  Eye Contact Fair  Facial Expression Flat;Pensive  Affect Apprehensive;Appropriate to circumstance  Speech Logical/coherent  Interaction Assertive  Motor Activity Slow  Appearance/Hygiene In scrubs  Behavior Characteristics Cooperative  Mood Anxious;Preoccupied  Thought Process  Coherency Concrete thinking  Content Blaming others  Delusions Persecutory  Perception WDL  Hallucination None reported or observed  Judgment Poor  Confusion None  Danger to Self  Current suicidal ideation? Denies  Danger to Others  Danger to Others None reported or observed

## 2021-11-09 NOTE — ED Notes (Signed)
Patient is paranoid with bizarre behavior.  He laughs inappropriately and has mood swings.  He makes demands of staff including, "I want a coke now.  I better leave soon."  Security at bedside as show of support.

## 2021-11-09 NOTE — BHH Group Notes (Signed)
PT was informed but did not attend group. ?
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
## 2021-11-09 NOTE — ED Notes (Signed)
Pt refused CT scan at this time. Provider made aware. Pt taken back to his room.

## 2021-11-09 NOTE — ED Notes (Signed)
Patient off unit to [facility] per provider. Patient alert, calm, cooperative, no s/s of distress. DC information given to [employer] for transport. Patient ambulatory off unit, escorted and transported by [employer].

## 2021-11-09 NOTE — ED Provider Notes (Signed)
Emergency Medicine Observation Re-evaluation Note  Mario Thomas is a 20 y.o. male, seen on rounds today.  Pt initially presented to the ED for complaints of Schizophrenia Currently, the patient is watching TV.  Physical Exam  BP 130/80 (BP Location: Left Arm)   Pulse 67   Temp 98.3 F (36.8 C) (Oral)   Resp 20   SpO2 99%  Physical Exam General: nad Cardiac: rrr Lungs: clear Psych: psychotic  ED Course / MDM  EKG:   I have reviewed the labs performed to date as well as medications administered while in observation.  Recent changes in the last 24 hours include none.  Plan  Current plan is for inpt criteria met and looking for placement. Mario Thomas is under involuntary commitment.      Mario Dessert, MD 11/09/21 1055

## 2021-11-09 NOTE — Tx Team (Cosign Needed)
Initial Treatment Plan 11/09/2021 6:14 PM Mario Thomas [id number]    PATIENT STRESSORS: Educational concerns   Health problems   Medication change or noncompliance   Traumatic event     PATIENT STRENGTHS: Ability for insight  Average or above average intelligence  Communication skills  Supportive family/friends  Work skills    PATIENT IDENTIFIED PROBLEMS: anxiety  Family conflict  homelessness                 DISCHARGE CRITERIA:  Ability to meet basic life and health needs Improved stabilization in mood, thinking, and/or behavior Motivation to continue treatment in a less acute level of care Need for constant or close observation no longer present  PRELIMINARY DISCHARGE PLAN: Attend aftercare/continuing care group Outpatient therapy Placement in alternative living arrangements Return to previous work or school arrangements  PATIENT/FAMILY INVOLVEMENT: This treatment plan has been presented to and reviewed with the patient, Mario Thomas.  The patient and family have been given the opportunity to ask questions and make suggestions.  Raylene Miyamoto, RN 11/09/2021, 6:14 PM

## 2021-11-09 NOTE — Progress Notes (Signed)
Consulted with care team at previous facility. No belongings found that pt stated they were brought in with.

## 2021-11-09 NOTE — Progress Notes (Addendum)
Pt had a string necklace with metal cross that was removed by Clinical research associate and placed in his locker and noted on his belongings sheet

## 2021-11-09 NOTE — BH Assessment (Signed)
[facility] Assessment Progress Note   Per Elta Guadeloupe, PMHNP, this pt requires psychiatric hospitalization.  Danika, RN, [facility] has tentatively assigned pt to [facility] Rm 502-1 to the service of Dr Sherron Flemings, pending negative Covid-19 results and a urine sample being obtained for UDS.  Pt presents under IVC initiated by pt's mother and upheld by EDP Chaney Malling, MD and IVC documents have been sent to [facility].  EDP Gwyneth Sprout, MD and pt's nurse, Waynetta Sandy, have been notified, and Waynetta Sandy agrees to call report to [phone].  Pt is to be transported via Patent examiner when the time comes.     Doylene Canning, [place] [facility] Coordinator [phone]
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
## 2021-11-10 ENCOUNTER — Telehealth (HOSPITAL_COMMUNITY): Payer: Self-pay | Admitting: Family Medicine

## 2021-11-10 ENCOUNTER — Encounter (HOSPITAL_COMMUNITY): Payer: Self-pay

## 2021-11-10 DIAGNOSIS — F319 Bipolar disorder, unspecified: Secondary | ICD-10-CM
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
## 2021-11-10 NOTE — Plan of Care (Signed)
  Problem: Education: Goal: Ability to state activities that reduce stress will improve Outcome: Progressing   Problem: Coping: Goal: Ability to identify and develop effective coping behavior will improve Outcome: Progressing   Problem: Self-Concept: Goal: Ability to identify factors that promote anxiety will improve Outcome: Progressing   

## 2021-11-10 NOTE — BH Assessment (Signed)
Care Management - Follow Up Discharges   Patient has been placed in an inpatient psychiatric hospital ([facility]) on 11-09-2021.   
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
## 2021-11-10 NOTE — Group Note (Signed)
LCSW Group Therapy Note   1:15pm   Type of Therapy and Topic: Group Therapy: Holding on to Grudges    Participation Level: None     Description of Group:  In this group patients will be asked to explore and define a grudge. Patients will be guided to discuss their thoughts, feelings, and reasons as to why people have grudges. Patients will process the impact grudges have on daily life and identify thoughts and feelings related to holding grudges. Facilitator will challenge patients to identify ways to let go of grudges and the benefits this provides. Patients will be confronted to address why one struggles letting go of grudges. Lastly, patients will identify feelings and thoughts related to what life would look like without grudges. This group will be process-oriented, with patients participating in exploration of their own experiences, giving and receiving support, and processing challenge from other group members.   Therapeutic Goals:  1. Patient will identify specific grudges related to their personal life.  2. Patient will identify feelings, thoughts, and beliefs around grudges.  3. Patient will identify how one releases grudges appropriately.  4. Patient will identify situations where they could have let go of the grudge, but instead chose to hold on.    Summary of Patient Progress: Pt attended group, however did not participate      Therapeutic Modalities:  Cognitive Behavioral Therapy  Solution Focused Therapy  Motivational Interviewing  Brief Therapy     Brendalyn Vallely MSW, LCSW Clincal Social Worker  [facility]          

## 2021-11-10 NOTE — Progress Notes (Signed)
Pt visible on the unit some this evening, pt stated he talked to his dad and got a visit from him this evening. Pt stated he was feeling better    11/10/21 2100  Psych Admission Type (Psych Patients Only)  Admission Status Involuntary  Psychosocial Assessment  Patient Complaints Anxiety  Eye Contact Fair  Facial Expression Anxious  Affect Apprehensive;Appropriate to circumstance  Speech Logical/coherent  Interaction Assertive  Motor Activity Slow  Appearance/Hygiene In hospital gown  Behavior Characteristics Cooperative  Mood Anxious;Pleasant  Thought Process  Coherency Concrete thinking  Content Blaming others  Delusions None reported or observed  Perception WDL  Hallucination None reported or observed  Judgment Poor  Confusion None  Danger to Self  Current suicidal ideation? Denies  Danger to Others  Danger to Others None reported or observed

## 2021-11-10 NOTE — BH IP Treatment Plan (Signed)
Interdisciplinary Treatment and Diagnostic Plan Update  11/10/2021 Mario Thomas MRN: [id number]  Principal Diagnosis: Bipolar 1 disorder ([place])  Secondary Diagnoses: Principal Problem:   Bipolar 1 disorder ([place])   Current Medications:  Current Facility-Administered Medications  Medication Dose Route Frequency Provider Last Rate Last Admin   acetaminophen (TYLENOL) tablet 650 mg  650 mg Oral Q6H PRN Ethelene Hal, NP       alum & mag hydroxide-simeth (MAALOX/MYLANTA) 200-200-20 MG/5ML suspension 30 mL  30 mL Oral Q4H PRN Ethelene Hal, NP       ARIPiprazole (ABILIFY) tablet 10 mg  10 mg Oral QHS Ethelene Hal, NP   10 mg at 11/09/21 2034   benztropine (COGENTIN) tablet 1 mg  1 mg Oral QHS Ethelene Hal, NP   1 mg at 11/09/21 2034   hydrOXYzine (ATARAX) tablet 25 mg  25 mg Oral TID PRN Ethelene Hal, NP       risperiDONE (RISPERDAL M-TABS) disintegrating tablet 2 mg  2 mg Oral Q8H PRN Ethelene Hal, NP       And   LORazepam (ATIVAN) tablet 1 mg  1 mg Oral PRN Ethelene Hal, NP       And   ziprasidone (GEODON) injection 20 mg  20 mg Intramuscular PRN Ethelene Hal, NP       magnesium hydroxide (MILK OF MAGNESIA) suspension 30 mL  30 mL Oral Daily PRN Ethelene Hal, NP       traZODone (DESYREL) tablet 50 mg  50 mg Oral QHS PRN Ethelene Hal, NP   50 mg at 11/09/21 2034   PTA Medications: Medications Prior to Admission  Medication Sig Dispense Refill Last Dose   ARIPiprazole (ABILIFY) 10 MG tablet Take 10 mg by mouth at bedtime. (Patient not taking: Reported on 11/08/2021)      benztropine (COGENTIN) 1 MG tablet Take 1 mg by mouth at bedtime. (Patient not taking: Reported on 11/08/2021)       Patient Stressors: Educational concerns   Health problems   Medication change or noncompliance   Traumatic event    Patient Strengths: Ability for insight  Average or above average intelligence  Communication skills   Supportive family/friends  Work skills   Treatment Modalities: Medication Management, Group therapy, Case management,  1 to 1 session with clinician, Psychoeducation, Recreational therapy.   Physician Treatment Plan for Primary Diagnosis: Bipolar 1 disorder ([place]) Long Term Goal(s):     Short Term Goals:    Medication Management: Evaluate patient's response, side effects, and tolerance of medication regimen.  Therapeutic Interventions: 1 to 1 sessions, Unit Group sessions and Medication administration.  Evaluation of Outcomes: Not Met  Physician Treatment Plan for Secondary Diagnosis: Principal Problem:   Bipolar 1 disorder ([place])  Long Term Goal(s):     Short Term Goals:       Medication Management: Evaluate patient's response, side effects, and tolerance of medication regimen.  Therapeutic Interventions: 1 to 1 sessions, Unit Group sessions and Medication administration.  Evaluation of Outcomes: Not Met   RN Treatment Plan for Primary Diagnosis: Bipolar 1 disorder ([place]) Long Term Goal(s): Knowledge of disease and therapeutic regimen to maintain health will improve  Short Term Goals: Ability to participate in decision making will improve, Ability to verbalize feelings will improve, and Ability to disclose and discuss suicidal ideas  Medication Management: RN will administer medications as ordered by provider, will assess and evaluate patient's response and provide education to patient for prescribed  medication. RN will report any adverse and/or side effects to prescribing provider.  Therapeutic Interventions: 1 on 1 counseling sessions, Psychoeducation, Medication administration, Evaluate responses to treatment, Monitor vital signs and CBGs as ordered, Perform/monitor CIWA, COWS, AIMS and Fall Risk screenings as ordered, Perform wound care treatments as ordered.  Evaluation of Outcomes: Not Met   LCSW Treatment Plan for Primary Diagnosis: Bipolar 1 disorder ([place]) Long  Term Goal(s): Safe transition to appropriate next level of care at discharge, Engage patient in therapeutic group addressing interpersonal concerns.  Short Term Goals: Engage patient in aftercare planning with referrals and resources, Increase social support, and Increase ability to appropriately verbalize feelings  Therapeutic Interventions: Assess for all discharge needs, 1 to 1 time with Social worker, Explore available resources and support systems, Assess for adequacy in community support network, Educate family and significant other(s) on suicide prevention, Complete Psychosocial Assessment, Interpersonal group therapy.  Evaluation of Outcomes: Not Met   Progress in Treatment: Attending groups: Yes. Participating in groups: Yes. Taking medication as prescribed: Yes. Toleration medication: Yes. Family/Significant other contact made: Yes, individual(s) contacted:  pt declined consents Patient understands diagnosis: Yes. Discussing patient identified problems/goals with staff: Yes. Medical problems stabilized or resolved: Yes. Denies suicidal/homicidal ideation: Yes. Issues/concerns per patient self-inventory: Yes. Other: None  New problem(s) identified: No, Describe:  None  New Short Term/Long Term Goal(s):medication stabilization, elimination of SI thoughts, development of comprehensive mental wellness plan.   Patient Goals:  "to recover and have a peace of mind."  Discharge Plan or Barriers: Patient recently admitted. CSW will continue to follow and assess for appropriate referrals and possible discharge planning.   Reason for Continuation of Hospitalization: Delusions  Mania Medication stabilization  Estimated Length of Stay: 3-5 days   Scribe for Treatment Team: Eliott Nine 11/10/2021 3:16 PM

## 2021-11-10 NOTE — BHH Group Notes (Signed)
Adult Psychoeducational Group Note  Date:  11/10/2021 Time:  9:21 AM  Group Topic/Focus:  Goals Group:   The focus of this group is to help patients establish daily goals to achieve during treatment and discuss how the patient can incorporate goal setting into their daily lives to aide in recovery.  Participation Level:  Active  Participation Quality:  Appropriate  Affect:  Appropriate  Cognitive:  Appropriate  Insight: Appropriate  Engagement in Group:  Engaged  Modes of Intervention:  Discussion  Additional Comments:  Patient attended group and said that his goal for today is to feel at peace and continue to have positive thoughts.   Eloy Fehl W Decarla Siemen 11/10/2021, 9:21 AM
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
## 2021-11-10 NOTE — [facility] Suicide Risk Assessment (Signed)
[facility] INPATIENT:  Family/Significant Other Suicide Prevention Education  Suicide Prevention Education:  Patient Refusal for Family/Significant Other Suicide Prevention Education: The patient Mario Thomas has refused to provide written consent for family/significant other to be provided Family/Significant Other Suicide Prevention Education during admission and/or prior to discharge.  Physician notified.  Haniyyah Sakuma A Zackari Ruane 11/10/2021, 10:26 AM

## 2021-11-10 NOTE — Progress Notes (Signed)
Progress note  Pt found in bed. Pt was compliant with morning assessment. Pt has been reclusive to their room most of the day. Pt is minimal with assessment. Pt is pleasant. Pt denies si/hi/ah/vh and verbally agrees to approach staff if these become apparent or before harming themselves/others while at [facility].  A: Pt provided support and encouragement. Pt given medication per protocol and standing orders. Q44m safety checks implemented and continued.  R: Pt safe on the unit. Will continue to monitor.

## 2021-11-10 NOTE — Group Note (Signed)
Recreation Therapy Group Note   Group Topic:Healthy Decision Making  Group Date: 11/10/2021 Start Time: 1002 End Time: 1050 Facilitators: Caroll Rancher, LRT,CTRS Location: [address] Dayroom  Goal Area(s) Addresses:  Patient will effectively work with peer towards shared goal.  Patient will identify factors that guided their decision making.  Patient will pro-socially communicate ideas during group session.   Group Description: Patients were given a scenario that they were going to be stranded on a deserted [place] for several months before being rescued. Writer tasked them with making a list of 15 things they would choose to bring with them for "survival". The list of items was prioritized most important to least. Each patient would come up with their own list, then work together to create a new list of 15 items while in a group of 3-5 peers. LRT discussed each person's list and how it differed from others. The debrief included discussion of priorities, good decisions versus bad decisions, and how it is important to think before acting so we can make the best decision possible. LRT tied the concept of effective communication among group members to patient's support systems outside of the hospital and its benefit post discharge.   Affect/Mood: N/A   Participation Level: Did not attend    Clinical Observations/Individualized Feedback: Pt did not attend group, meeting with doctor.    Plan: Continue to engage patient in RT group sessions 2-3x/week.   Caroll Rancher, LRT,CTRS 11/10/2021 1:08 PM

## 2021-11-10 NOTE — [facility] Group Notes (Signed)
Adult Psychoeducational Group Note  Date:  11/10/2021 Time:  9:06 PM  Group Topic/Focus:  Conflict Resolution:   The focus of this group is to discuss the conflict resolution process and how it may be used upon discharge.  Participation Level:  Active  Participation Quality:  Attentive  Affect:  Appropriate  Cognitive:  Alert  Insight: Good  Engagement in Group:  Engaged  Modes of Intervention:  Discussion  Additional Comments  Mario Thomas 11/10/2021, 9:06 PM

## 2021-11-11 MED ORDER — PALIPERIDONE ER 3 MG PO TB24
3.0000 mg | ORAL_TABLET | Freq: Once | ORAL | Status: AC
  Administered 2021-11-11: 3 mg via ORAL
  Filled 2021-11-11 (×2): qty 1

## 2021-11-11 MED ORDER — PALIPERIDONE ER 6 MG PO TB24
6.0000 mg | ORAL_TABLET | Freq: Every day | ORAL | Status: DC
  Administered 2021-11-12 – 2021-11-13 (×2): 6 mg via ORAL
  Filled 2021-11-11 (×3): qty 1

## 2021-11-11 NOTE — Progress Notes (Signed)
Patient has been pacing the hall, compliant with medications and attending groups. Patient denies SI, HI and AVH this shift. Patient has been requesting to speak to the physician and has spoken to the physician several times.   Assess patient for safety, offer medications as prescribed, engage patient in 1:1 staff talks.   Patient able to contract for safety. Continue to monitor as planned.

## 2021-11-11 NOTE — [facility] Group Notes (Signed)
Psychoeducational Group Note    Date:11/11/2021 Time: 1300-1400    Purpose of Group: . The group focus' on teaching patients on how to identify their needs and their Life Skills:  A group where two lists are made. What people need and what are things that we do that are unhealthy. The lists are developed by the patients and it is explained that we often do the actions that are not healthy to get our list of needs met.  Goal:: to develop the coping skills needed to get their needs met  Participation Level:  Active  Participation Quality:  Appropriate  Affect:  Appropriate  Cognitive:  Oriented  Insight:  Improving  Engagement in Group:  Engaged  Additional Comments:  Rates his energy at a 6.5. States the reason he is here is, "I got into a confrontation with my parents. It was my fault and I was wrong". Pt.'s affect is a bit pensive. Is very serious. Paranoid?  Mario Thomas

## 2021-11-11 NOTE — Group Note (Signed)
LCSW Group Therapy   Due to high patient acuity and low staffing, group was unable to be held on 11/11/2021. The CSW supervisor as well as the on-duty AC was made aware.  Raysha Tilmon T Dondi Burandt LCSWA  12:33 PM  

## 2021-11-11 NOTE — Progress Notes (Addendum)
[facility] MD Progress Note  11/11/2021 4:08 PM Mario Thomas  MRN:  [id number] Subjective:  Mario Thomas is a 20 year old patient with a past psychiatric history of bipolar disorder and a [facility] when he was 20 years old.  Patient presented for this hospitalization during reported manic episode, current diagnosis this hospitalization is bipolar 1, current episode, manic.  Case was discussed in the multidisciplinary team. MAR was reviewed and patient was compliant with medications.  He did not require any PRN's for agitation.     Psychiatric Team made the following recommendations yesterday:  - Continue Abilify 10 mg daily - Continue home Cogentin 1 mg nightly -Agitation protocol: Risperdal 2 mg q.  8 H as needed w/ Ativan 1 mg as needed w/ Geodon 20 mg IM  On assessment today patient reports that he is feeling "great" and sleeping well.  Patient reports that he is also eating well.  Patient reports that he is happy that his father came last night for visitation and that they formulated a plan for him at discharge.  Patient reports that his plan is to return to his parent's home and go back to work with the intention of returning to school on the next semester.  Patient reports that he intends to go to [employer] and not [employer], and currently remains undecided.  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  Patient is noted to be a bit guarded on assessment today.  Patient denies SI and HI and VH.  Patient initially denies auditory hallucinations however, upon further questioning about patient's comments to his mother patient reports that he has been hearing a voice is not his own intruded to his head giving him commands.  Patient reports he believes the voices "evil" because it keeps him from succeeding.  Patient reports that his utmost  priority is a medication that will help him "concentrate better" and would keep this voice from distracting him.  Patient reports he does not believe his Abilify was the best medication for him as he was upset that he continued to have the command hallucinations and endorse that he would be interested in trying a different medication. Principal Problem: Bipolar 1 disorder (HCC) Diagnosis: Principal Problem:   Bipolar 1 disorder (HCC)  Total Time spent with patient: 20 minutes  Past Psychiatric History: See H&P  Past Medical History:  Past Medical History:  Diagnosis Date   Heart murmur    MDD (major depressive disorder), severe (HCC) 08/23/2018   History reviewed. No pertinent surgical history. Family History:  Family History  Problem Relation Age of Onset   Hyperlipidemia Father    Family Psychiatric  History: See H&P Social History:  Social History   Substance and Sexual Activity  Alcohol Use Never     Social History   Substance and Sexual Activity  Drug Use Never    Social History   Socioeconomic History   Marital status: Single    Spouse name: Not on file   Number of children: Not on file   Years of education: Not on file   Highest education level: Not on file  Occupational History   Not on file  Tobacco Use   Smoking status: Never   Smokeless tobacco: Never  Vaping Use   Vaping Use: Never used  Substance and Sexual Activity   Alcohol use: Never  Drug use: Never   Sexual activity: Never  Other Topics Concern   Not on file  Social History Narrative   01/23/21   From: the area   Living: with parents   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, 1 sibling      Enjoys: running      Exercise: daily running    Diet: meat, veggies, grains      Safety   Seat belts: Yes    Guns: No   Safe in relationships: Yes    Social Determinants of Corporate investment banker Strain: Not on file  Food  Insecurity: Not on file  Transportation Needs: Not on file  Physical Activity: Not on file  Stress: Not on file  Social Connections: Not on file   Additional Social History:                         Sleep: Fair  Appetite:  Good  Current Medications: Current Facility-Administered Medications  Medication Dose Route Frequency Provider Last Rate Last Admin   acetaminophen (TYLENOL) tablet 650 mg  650 mg Oral Q6H PRN Laveda Abbe, NP       alum & mag hydroxide-simeth (MAALOX/MYLANTA) 200-200-20 MG/5ML suspension 30 mL  30 mL Oral Q4H PRN Laveda Abbe, NP       benztropine (COGENTIN) tablet 1 mg  1 mg Oral QHS Laveda Abbe, NP   1 mg at 11/10/21 2039   hydrOXYzine (ATARAX) tablet 25 mg  25 mg Oral TID PRN Laveda Abbe, NP       risperiDONE (RISPERDAL M-TABS) disintegrating tablet 2 mg  2 mg Oral Q8H PRN Laveda Abbe, NP       And   LORazepam (ATIVAN) tablet 1 mg  1 mg Oral PRN Laveda Abbe, NP       And   ziprasidone (GEODON) injection 20 mg  20 mg Intramuscular PRN Laveda Abbe, NP       magnesium hydroxide (MILK OF MAGNESIA) suspension 30 mL  30 mL Oral Daily PRN Laveda Abbe, NP       paliperidone (INVEGA) 24 hr tablet 3 mg  3 mg Oral Once Bobbye Morton, MD       [START ON 11/12/2021] paliperidone (INVEGA) 24 hr tablet 6 mg  6 mg Oral Daily Eliseo Gum B, MD       traZODone (DESYREL) tablet 50 mg  50 mg Oral QHS PRN Laveda Abbe, NP   50 mg at 11/10/21 2039    Lab Results: No results found for this or any previous visit (from the past 48 hour(s)).  Blood Alcohol level:  Lab Results  Component Value Date   ETH <10 11/08/2021   ETH <10 08/07/2019    Metabolic Disorder Labs: Lab Results  Component Value Date   HGBA1C 5.0 08/24/2018   MPG 97 08/24/2018   Lab Results  Component Value Date   PROLACTIN 10.0 08/24/2018   Lab Results  Component Value Date   CHOL 113 08/24/2018   TRIG  41 08/24/2018   HDL 47 08/24/2018   CHOLHDL 2.4 08/24/2018   VLDL 8 08/24/2018   LDLCALC 58 08/24/2018    Physical Findings: AIMS: Facial and Oral Movements Muscles of Facial Expression: None, normal Lips and Perioral Area: None, normal Jaw: None, normal Tongue: None, normal,Extremity Movements Upper (arms, wrists, hands,  fingers): None, normal Lower (legs, knees, ankles, toes): None, normal, Trunk Movements Neck, shoulders, hips: None, normal, Overall Severity Severity of abnormal movements (highest score from questions above): None, normal Incapacitation due to abnormal movements: None, normal Patient's awareness of abnormal movements (rate only patient's report): No Awareness, Dental Status Current problems with teeth and/or dentures?: No Does patient usually wear dentures?: No  CIWA:    COWS:     Musculoskeletal: Strength & Muscle Tone: within normal limits Gait & Station: normal Patient leans: N/A  Psychiatric Specialty Exam:  Presentation  General Appearance: Bizarre  Eye Contact:Good  Speech:Clear and Coherent  Speech Volume:Normal  Handedness:Right   Mood and Affect  Mood:Anxious  Affect:Restricted   Thought Process  Thought Processes:Goal Directed  Descriptions of Associations:Circumstantial  Orientation:Full (Time, Place and Person)  Thought Content:Illogical  History of Schizophrenia/Schizoaffective disorder:No  Duration of Psychotic Symptoms:No data recorded Hallucinations:Hallucinations: Command Description of Command Hallucinations: "a voice that tells me things that keep me from being successful, it's not mine. I think it's evil because it does not want me to succeed."  Ideas of Reference:Percusatory (believes his family is against him)  Suicidal Thoughts:Suicidal Thoughts: No  Homicidal Thoughts:Homicidal Thoughts: No   Sensorium  Memory:Immediate Fair; Recent Fair  Judgment:Impaired  Insight:None   Executive Functions   Concentration:Fair  Attention Span:Fair  Recall:Good  Fund of Knowledge:Good  Language:Good   Psychomotor Activity  Psychomotor Activity:Psychomotor Activity: Restlessness   Assets  Assets:Communication Skills; Housing; Research scientist (medical); Resilience   Sleep  Sleep:Sleep: Fair    Physical Exam: Physical Exam Constitutional:      Comments: Patient sitting upright at  90 degrees, with his hands and legs crossed, staring at provider  HENT:     Head: Normocephalic and atraumatic.  Eyes:     Extraocular Movements: Extraocular movements intact.  Pulmonary:     Effort: Pulmonary effort is normal.  Neurological:     Mental Status: He is alert and oriented to person, place, and time.   Review of Systems  Psychiatric/Behavioral:  Negative for depression and suicidal ideas.   Blood pressure 129/77, pulse 87, temperature (!) 97.5 F (36.4 C), temperature source Oral, resp. rate 18, height 6' (1.829 m), weight 68 kg, SpO2 100 %. Body mass index is 20.34 kg/m.   Treatment Plan Summary: Daily contact with patient to assess and evaluate symptoms and progress in treatment and Medication management  Salaam Guizar is a 20 year old patient with a past psychiatric history of bipolar 1 disorder who presented as a transfer from the [facility] to [facility] with IVC in place concerning for manic behavior.  Patient appears a bit more intrusive and restless today.  During assessment patient is noted to be more guarded and appears to be attempting to refrain from his restlessness; however provider has noted when patient is on the unit he is not able to withhold the symptoms.  Patient does appear to be having command hallucinations at least prior to his hospitalization, and staff are concerned patient may be responding to internal stimuli during hospitalization.  It appears that the patient uses "concentration" and distractions" as interchangeable words for auditory hallucinations; however it does appear patient has  been having these.  Patient endorsed that his ultimate goal is to be stable and finish school and would like to get rid of the auditory hallucinations.  Patient does not appear to have any insight into his mood dysregulation, but is willing to be compliant with medication for his psychotic symptoms.  Bipolar 1 disorder, current  episode, manic to be/with psychotic features (R/O bipolar 1 disorder, current episode manic W/psychotic features VS schizoaffective disorder, bipolar type VS schizophrenia, unspecified) Plan Continue IVC - Discontinue Abilify 10 mg daily -Start paliperidone 3 mg, once increased to 6 mg tomorrow a.m. - Continue home Cogentin 1 mg nightly -Agitation protocol: Risperdal 2 mg q.  8 H as needed w/ Ativan 1 mg as needed w/ Geodon 20 mg IM   PRN -Tylenol 650mg  q6h, pain -Maalox 35ml q4h, indigestion -Atarax 25mg  TID, anxiety -Milk of Mag 20mL, constipation -Trazodone 50mg  QHS, insomnia  Recommend observation level: Routine every 15 minute checks   PGY-2 , MD 11/11/2021, 4:08 PM

## 2021-11-11 NOTE — Progress Notes (Signed)
Adult Psychoeducational Group Note  Date:  11/11/2021 Time:  8:13 PM  Group Topic/Focus:  Wrap-Up Group:   The focus of this group is to help patients review their daily goal of treatment and discuss progress on daily workbooks.  Participation Level:  Active  Participation Quality:  Appropriate  Affect:  Appropriate  Cognitive:  Appropriate  Insight: Appropriate  Engagement in Group:  Engaged  Modes of Intervention:  Discussion  Additional Comments:  Pt goal for the day was to concentrate, pt goal was met.  Tonia Brooms D 11/11/2021, 8:13 PM

## 2021-11-12 NOTE — Progress Notes (Signed)
[facility] MD Progress Note  11/12/2021 2:26 PM Mario Thomas  MRN:  [id number] Subjective:  Mario Thomas is a 20 year old patient with a past psychiatric history of bipolar disorder and a prior [facility] when he was 20 years old.  Patient presented for this hospitalization during reported manic episode, current diagnosis this hospitalization is bipolar 1, current episode, manic.  Case was discussed in the multidisciplinary team. MAR was reviewed and patient was compliant with medications.  He did not require any PRN's for agitation.     Psychiatric Team made the following recommendations yesterday:  Continue IVC - Discontinue Abilify 10 mg daily -Start paliperidone 3 mg, once increased to 6 mg tomorrow a.m. - Continue home Cogentin 1 mg nightly  Overnight patient was not noted to have any concerns.  On assessment this a.m. patient is noted to be a bit bizarre again.  Patient is noted to laugh and smirk inappropriately.  Patient endorsed that is both hard for him to control his laughter and reports that he also was thinking of putting things about his family.  Patient reports that he did not have a good visit with his father yesterday, but endorses that he did not take out his emotions on others and chose to just leave the room.  Patient reports that this was instigated by his father saying something that he did not agree with.  Patient reports that he has been having good conversations with his mother.  Per staff inpatient, patient is sleeping well.  Patient denies that he is having any significant adverse side effects from starting paliperidone.  Patient reports that he did feel that the medication is working and endorses that he feels more "calm" patient reports that he has not been hearing the "evil voice" overall denying command hallucinations.  Patient also denies SI, HI and AVH.  Patient endorses that he is eating well.   Patient provider discussed patient's thoughts about returning to college and patient appears more open in discussing this today.  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  Principal Problem: Bipolar 1 disorder (HCC) Diagnosis: Principal Problem:   Bipolar 1 disorder (HCC)  Total Time spent with patient: 15 minutes  Past Psychiatric History: See H&P  Past Medical History:  Past Medical History:  Diagnosis Date   Heart murmur    MDD (major depressive disorder), severe (HCC) 08/23/2018   History reviewed. No pertinent surgical history. Family History:  Family History  Problem Relation Age of Onset   Hyperlipidemia Father    Family Psychiatric  History: See H&P Social History:  Social History   Substance and Sexual Activity  Alcohol Use Never     Social History   Substance and Sexual Activity  Drug Use Never    Social History   Socioeconomic History   Marital status: Single    Spouse name: Not on file   Number of children: Not on file   Years of education: Not on file   Highest education level: Not on file  Occupational History   Not on file  Tobacco Use   Smoking status: Never   Smokeless tobacco: Never  Vaping Use   Vaping Use: Never used  Substance and Sexual Activity   Alcohol use: Never  Drug use: Never   Sexual activity: Never  Other Topics Concern   Not on file  Social History Narrative   01/23/21   From: the area   Living: with parents   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, 1 sibling      Enjoys: running      Exercise: daily running    Diet: meat, veggies, grains      Safety   Seat belts: Yes    Guns: No   Safe in relationships: Yes    Social Determinants of Manufacturing engineer Strain: Not on file  Food Insecurity: Not on file  Transportation Needs: Not on file  Physical Activity: Not on file  Stress: Not on file  Social Connections: Not on file   Additional Social History:                         Sleep: Good  Appetite:  Good  Current Medications: Current Facility-Administered Medications  Medication Dose Route Frequency Provider Last Rate Last Admin   acetaminophen (TYLENOL) tablet 650 mg  650 mg Oral Q6H PRN Laveda Abbe, NP       alum & mag hydroxide-simeth (MAALOX/MYLANTA) 200-200-20 MG/5ML suspension 30 mL  30 mL Oral Q4H PRN Laveda Abbe, NP       benztropine (COGENTIN) tablet 1 mg  1 mg Oral QHS Laveda Abbe, NP   1 mg at 11/11/21 2044   hydrOXYzine (ATARAX) tablet 25 mg  25 mg Oral TID PRN Laveda Abbe, NP       risperiDONE (RISPERDAL M-TABS) disintegrating tablet 2 mg  2 mg Oral Q8H PRN Laveda Abbe, NP       And   LORazepam (ATIVAN) tablet 1 mg  1 mg Oral PRN Laveda Abbe, NP       And   ziprasidone (GEODON) injection 20 mg  20 mg Intramuscular PRN Laveda Abbe, NP       magnesium hydroxide (MILK OF MAGNESIA) suspension 30 mL  30 mL Oral Daily PRN Laveda Abbe, NP       paliperidone (INVEGA) 24 hr tablet 6 mg  6 mg Oral Daily Eliseo Gum B, MD   6 mg at 11/12/21 0817   traZODone (DESYREL) tablet 50 mg  50 mg Oral QHS PRN Laveda Abbe, NP   50 mg at 11/11/21 2044    Lab Results: No results found for this or any previous visit (from the past 48 hour(s)).  Blood Alcohol level:  Lab Results  Component Value Date   ETH <10 11/08/2021   ETH <10 08/07/2019    Metabolic Disorder Labs: Lab Results  Component Value Date   HGBA1C 5.0 08/24/2018   MPG 97 08/24/2018   Lab Results  Component Value Date   PROLACTIN 10.0 08/24/2018   Lab Results  Component Value Date   CHOL 113 08/24/2018   TRIG 41 08/24/2018   HDL 47 08/24/2018    CHOLHDL 2.4 08/24/2018   VLDL 8 08/24/2018   LDLCALC 58 08/24/2018    Physical Findings: AIMS: Facial and Oral Movements Muscles of Facial Expression: None, normal Lips and Perioral Area: None, normal Jaw: None, normal Tongue: None, normal,Extremity Movements Upper (arms, wrists, hands, fingers): None, normal Lower (legs, knees, ankles, toes): None, normal, Trunk Movements Neck, shoulders, hips: None, normal, Overall Severity Severity of abnormal  movements (highest score from questions above): None, normal Incapacitation due to abnormal movements: None, normal Patient's awareness of abnormal movements (rate only patient's report): No Awareness, Dental Status Current problems with teeth and/or dentures?: No Does patient usually wear dentures?: No  CIWA:    COWS:     Musculoskeletal: Strength & Muscle Tone: within normal limits Gait & Station: normal Patient leans: N/A  Psychiatric Specialty Exam:  Presentation  General Appearance: Appropriate for Environment  Eye Contact:Good  Speech:Clear and Coherent  Speech Volume:Normal  Handedness:Right   Mood and Affect  Mood:-- ("more calm")  Affect:Inappropriate (laughing or holding laughter at innapropriate times)   Thought Process  Thought Processes:Goal Directed  Descriptions of Associations:Circumstantial (loose at times)  Orientation:Full (Time, Place and Person)  Thought Content:Logical  History of Schizophrenia/Schizoaffective disorder:No  Duration of Psychotic Symptoms:No data recorded Hallucinations:Hallucinations: None Description of Command Hallucinations: "a voice that tells me things that keep me from being successful, it's not mine. I think it's evil because it does not want me to succeed."  Ideas of Reference:None  Suicidal Thoughts:Suicidal Thoughts: No  Homicidal Thoughts:Homicidal Thoughts: No   Sensorium  Memory:Immediate Good; Recent Good; Remote  Good  Judgment:Impaired  Insight:None   Executive Functions  Concentration:Poor  Attention Span:Good  Recall:Good  Fund of Knowledge:Good  Language:Good   Psychomotor Activity  Psychomotor Activity:Psychomotor Activity: Increased   Assets  Assets:Communication Skills; Social Support; Housing; Resilience; Desire for Improvement   Sleep  Sleep:Sleep: Fair    Physical Exam: Physical Exam HENT:     Head: Normocephalic and atraumatic.  Eyes:     Extraocular Movements: Extraocular movements intact.  Pulmonary:     Effort: Pulmonary effort is normal.  Neurological:     Mental Status: He is alert and oriented to person, place, and time.   Review of Systems  Psychiatric/Behavioral:  Negative for depression, hallucinations and suicidal ideas. The patient does not have insomnia.   Blood pressure 136/80, pulse 85, temperature 98 F (36.7 C), temperature source Oral, resp. rate 18, height 6' (1.829 m), weight 68 kg, SpO2 100 %. Body mass index is 20.34 kg/m.   Treatment Plan Summary: Daily contact with patient to assess and evaluate symptoms and progress in treatment and Medication management Adell Benedict is a 20 year old patient with a past psychiatric history of bipolar 1 disorder who presented as a transfer from the [facility] to [facility] with IVC in place concerning for manic behavior.  Patient appears to be sleeping well and is now denying command hallucinations.  Unfortunately patient continues to minimize his symptoms but is less guarded on assessment today.  We will continue to monitor patient as he continues to have inappropriate affect and staff are concerned patient is responding to internal stimuli.  Patient will be receiving his first 6 mg dose of paliperidone today.  Patient will be moved off of 500 unit due to high acuity patient being moved onto the unit.  At this time patient does seem that he will be able to handle this transition appropriately.  Bipolar 1 disorder,  current episode, manic to be/with psychotic features (R/O bipolar 1 disorder, current episode manic W/psychotic features VS schizoaffective disorder, bipolar type VS schizophrenia, unspecified) Plan Continue IVC - continue paliperidone 6 mg  - Continue home Cogentin 1 mg nightly -Agitation protocol: Risperdal 2 mg q.  8 H as needed w/ Ativan 1 mg as needed w/ Geodon 20 mg IM   PRN -Tylenol 650mg  q6h, pain -Maalox 58ml q4h, indigestion -Atarax 25mg  TID, anxiety -Milk  of Mag 30mL, constipation -Trazodone  QHS, insomnia  Recommend observation level: Routine every 15 minute checks      PGY-2 Bobbye Morton, MD 11/12/2021, 2:26 PM

## 2021-11-12 NOTE — Progress Notes (Signed)
Patient has been up and visible in milieu today, has been attending and participating in activities. Patient did receive morning medication without incident and denied SI this morning. Patient was moved to another hall this afternoon as another patient would wander into room periodically. Patient appeared to be getting upset by this, however was able to respond appropriately to staff by moving to another hall. Patient remains safe at this time, will continue to monitor.

## 2021-11-12 NOTE — ED Notes (Signed)
Pt called looking for a black backpack, was found in 9-12 cabinets. [facility] notified that backpack was found, security to transport over to [facility]. 11/12/21 at 1837.

## 2021-11-12 NOTE — Group Note (Signed)
[facility] LCSW Group Therapy Note  Date/Time:  11/12/2021  11:00AM-12:00PM  Type of Therapy and Topic:  Group Therapy:  Music and Mood  Participation Level:  Active   Description of Group: In this process group, members listened to a variety of genres of music and identified that different types of music evoke different responses.  Patients were encouraged to identify music that was soothing for them and music that was energizing for them.  Patients discussed how this knowledge can help with wellness and recovery in various ways including managing depression and anxiety as well as encouraging healthy sleep habits.    Therapeutic Goals: Patients will explore the impact of different varieties of music on mood Patients will verbalize the thoughts they have when listening to different types of music Patients will identify music that is soothing to them as well as music that is energizing to them Patients will discuss how to use this knowledge to assist in maintaining wellness and recovery Patients will explore the use of music as a coping skill  Summary of Patient Progress:  At the beginning of group, patient expressed their mood was average, saying they felt okay overall.  At the end of group, patient expressed feeling more connected to certain songs than others.    Therapeutic Modalities: Solution Focused Brief Therapy Activity   Steve Rattler, [place] 11/12/2021 1:09 PM

## 2021-11-12 NOTE — [facility] Group Notes (Signed)
[facility] Group Notes:  (Nursing/MHT/Case Management/Adjunct)  Date:  11/12/2021  Time:  10:23 AM  Type of Therapy:   Orientation/Goals group  Participation Level:  Minimal  Participation Quality:  Attentive  Affect:  Appropriate  Cognitive:  Alert  Insight:  Appropriate  Engagement in Group:  Engaged  Modes of Intervention:  Discussion, Education, and Orientation  Summary of Progress/Problems: Pt goal for today is to speak to his parents in peace without getting agitated or upset.   Maribeth Jiles J Keonta Alsip 11/12/2021, 10:23 AM

## 2021-11-12 NOTE — Progress Notes (Signed)
   11/11/21 2050  Psych Admission Type (Psych Patients Only)  Admission Status Involuntary  Psychosocial Assessment  Patient Complaints None  Eye Contact Fair  Facial Expression Flat  Affect Appropriate to circumstance  Speech Logical/coherent  Interaction Assertive  Motor Activity Slow  Appearance/Hygiene Unremarkable  Behavior Characteristics Appropriate to situation;Cooperative  Mood Pleasant  Thought Administrator, sports thinking  Content Blaming self  Delusions None reported or observed  Perception WDL  Hallucination None reported or observed  Judgment Poor  Confusion None  Danger to Self  Current suicidal ideation? Denies  Danger to Others  Danger to Others None reported or observed

## 2021-11-12 NOTE — Progress Notes (Signed)
Patient has been pleasant and interacting well with Peers and Staff this shift. Compliant with medications. Denies SI/HI/A/VH and verbally contracted for safety. No adverse drug noted. Support and encouragement ongoing. Patient remains safe.

## 2021-11-13 MED ORDER — WHITE PETROLATUM EX OINT
TOPICAL_OINTMENT | CUTANEOUS | Status: AC
  Filled 2021-11-13: qty 5

## 2021-11-13 MED ORDER — PALIPERIDONE ER 3 MG PO TB24
3.0000 mg | ORAL_TABLET | Freq: Once | ORAL | Status: AC
  Administered 2021-11-13: 3 mg via ORAL
  Filled 2021-11-13 (×2): qty 1

## 2021-11-13 MED ORDER — PALIPERIDONE ER 3 MG PO TB24
9.0000 mg | ORAL_TABLET | Freq: Every day | ORAL | Status: DC
  Administered 2021-11-14 – 2021-11-16 (×3): 9 mg via ORAL
  Filled 2021-11-13 (×4): qty 3

## 2021-11-13 NOTE — Plan of Care (Signed)
  Problem: Self-Concept: Goal: Level of anxiety will decrease Outcome: Progressing Goal: Ability to modify response to factors that promote anxiety will improve Outcome: Progressing   Problem: Education: Goal: Knowledge of [place] [facility] Education information/materials will improve Outcome: Progressing   

## 2021-11-13 NOTE — [facility] Counselor (Signed)
CSW overheard several phone conversations by this pt. Pt called several people and repeatedly asked whether the person on the phone had prayed. Asked if they had gotten on their knees to pray and ask for forgiveness. And states that they will be convicted.        Ruthann Cancer MSW, LCSW Clincal Social Worker  [facility]

## 2021-11-13 NOTE — Progress Notes (Signed)
   11/13/21 2150  Psych Admission Type (Psych Patients Only)  Admission Status Involuntary  Psychosocial Assessment  Patient Complaints None  Eye Contact Brief;Avertive  Facial Expression Flat  Affect Flat;Appropriate to circumstance  Speech Logical/coherent  Interaction Assertive;Minimal;Guarded  Motor Activity Other (Comment) (WDL)  Appearance/Hygiene Unremarkable  Behavior Characteristics Cooperative;Appropriate to situation  Mood Pleasant  Thought Process  Coherency Concrete thinking;Blocking  Content WDL  Delusions None reported or observed  Perception WDL  Hallucination None reported or observed  Judgment Poor  Confusion None  Danger to Self  Current suicidal ideation? Denies  Danger to Others  Danger to Others None reported or observed

## 2021-11-13 NOTE — Progress Notes (Signed)
Progress note  Pt found in bed; compliant with medication administration. Pt continues to have a flat affect with anxious and intrusive/obsessive behavior. Pt continues to be preoccupied with their backpack. Pt was reassured it was here but pt continues to seem paranoid. Pt was hesitant to take their increase in medication but states they would take this so they didn't have to stay longer. Pt denies si/hi/ah/vh and verbally agrees to approach staff if these become apparent or before harming themselves/others while at [facility].  A: Pt provided support and encouragement. Pt given medication per protocol and standing orders. Q13m safety checks implemented and continued.  R: Pt safe on the unit. Will continue to monitor.

## 2021-11-13 NOTE — Progress Notes (Signed)
Adult Psychoeducational Group Note  Date:  11/13/2021 Time:  4:39 AM  Group Topic/Focus:  Wrap-Up Group:   The focus of this group is to help patients review their daily goal of treatment and discuss progress on daily workbooks.  Participation Level:  Active  Participation Quality:  Appropriate  Affect:  Appropriate  Cognitive:  Appropriate  Insight: Appropriate  Engagement in Group:  Engaged  Modes of Intervention:  Discussion  Additional Comments:  patient said his favorite color yellow the brightness.  Charna Busman Long 11/13/2021, 4:39 AM

## 2021-11-13 NOTE — Progress Notes (Signed)
Patient did not attend wrap up group. 

## 2021-11-13 NOTE — Group Note (Signed)
Recreation Therapy Group Note   Group Topic:Stress Management  Group Date: 11/13/2021 Start Time: 0931 End Time: 0945 Facilitators: Caroll Rancher, LRT,CTRS Location: 300 Hall Dayroom   Goal Area(s) Addresses:  Patient will actively participate in stress management techniques presented during session.  Patient will successfully identify benefit of practicing stress management post d/c.   Group Description: Guided Imagery. LRT provided education, instruction, and demonstration on practice of visualization via guided imagery. Patient was asked to participate in the technique introduced during session. LRT debriefed including topics of mindfulness, stress management and specific scenarios each patient could use these techniques. Patients were given suggestions of ways to access scripts post d/c and encouraged to explore Youtube and other apps available on smartphones, tablets, and computers.   Affect/Mood: N/A   Participation Level: Did not attend    Clinical Observations/Individualized Feedback:     Plan: Continue to engage patient in RT group sessions 2-3x/week.   Caroll Rancher, LRT,CTRS 11/13/2021 11:45 AM

## 2021-11-13 NOTE — Group Note (Signed)
LCSW Group Therapy Note   Group Date: 11/13/2021 Start Time: 1300 End Time: 1400   Type of Therapy and Topic:  Group Therapy: Boundaries  Participation Level:  Did Not Attend  Description of Group: This group will address the use of boundaries in their personal lives. Patients will explore why boundaries are important, the difference between healthy and unhealthy boundaries, and negative and postive outcomes of different boundaries and will look at how boundaries can be crossed.  Patients will be encouraged to identify current boundaries in their own lives and identify what kind of boundary is being set. Facilitators will guide patients in utilizing problem-solving interventions to address and correct types boundaries being used and to address when no boundary is being used. Understanding and applying boundaries will be explored and addressed for obtaining and maintaining a balanced life. Patients will be encouraged to explore ways to assertively make their boundaries and needs known to significant others in their lives, using other group members and facilitator for role play, support, and feedback.  Therapeutic Goals:  1.  Patient will identify areas in their life where setting clear boundaries could be  used to improve their life.  2.  Patient will identify signs/triggers that a boundary is not being respected. 3.  Patient will identify two ways to set boundaries in order to achieve balance in  their lives: 4.  Patient will demonstrate ability to communicate their needs and set boundaries  through discussion and/or role plays  Summary of Patient Progress:  Did not attend  Therapeutic Modalities:   Cognitive Behavioral Therapy Solution-Focused Therapy  Beatris Si, LCSW 11/13/2021  3:02 PM

## 2021-11-13 NOTE — [facility] Group Notes (Signed)
Patient did not attend notes.

## 2021-11-13 NOTE — Progress Notes (Addendum)
[facility] MD Progress Note  11/13/2021 11:43 AM Mario Thomas  MRN:  [id number] Subjective:   Mario Thomas is a 20 year old patient with a past psychiatric history of bipolar disorder and a [facility] when he was 20 years old.  Patient presented for this hospitalization during reported manic episode, current diagnosis this hospitalization is bipolar 1, current episode, manic.   Case was discussed in the multidisciplinary team. MAR was reviewed and patient was compliant with medications.  He did not require any PRN's for agitation.     Psychiatric Team made the following recommendations yesterday:  Continue IVC - Discontinue Abilify 10 mg daily - Paliperdone  - Continue home Cogentin 1 mg nightly  Patient was removed to [address] unit yesterday and was noted to do well the rest of the afternoon and night, and did not require any as needed agitation medications.  On assessment this a.m. patient appeared more appropriate and smirked when asked about hearing the "evil voice."  Patient endorsed that he is no longer hearing this and is beginning to find the idea a bit strange.  Patient reports that he is doing "good."  Patient reports he is eating and sleeping well and endorses that he had phone calls with his parents yesterday that went well.  Patient denies SI, HI and AVH.  Patient reports that he feels the medication is helping him feel "more normal."  Later this a.m. patient requested to speak with provider again.  Patient reported that he has been thinking about discharge and decided that he would like to go spend 10 days at a hotel near his parents using his last $1000.  Patient reports he does not wish to return back to his parent's house because he does not think they want him there.  Patient reports that he believes he was kicked out of the home when his parents IVC'd him.  Patient denies that he has been told by  his parents that he is not welcome back home.  Patient reports that he is also not thinking about calling any friends or other lodging arrangements and would like to spend exactly 10 days and then "I will let God tell me what happens next."  Patient suddenly begins talking about God and how God influences and controls all decisions regarding him.  Per staff note patient has become more hyperreligious over the past few hours this a.m. and has been making phone calls talking about praying and asking others over the phone have they have been praying. Principal Problem: Bipolar 1 disorder (HCC) Diagnosis: Principal Problem:   Bipolar 1 disorder (HCC)  Total Time spent with patient: 20 minutes  Past Psychiatric History: See H&P  Past Medical History:  Past Medical History:  Diagnosis Date   Heart murmur    MDD (major depressive disorder), severe (HCC) 08/23/2018   History reviewed. No pertinent surgical history. Family History:  Family History  Problem Relation Age of Onset   Hyperlipidemia Father    Family Psychiatric  History: See H&P Social History:  Social History   Substance and Sexual Activity  Alcohol Use Never     Social History   Substance and Sexual Activity  Drug Use Never    Social History   Socioeconomic History   Marital status: Single    Spouse name: Not on file   Number of children: Not on file   Years of education: Not on file  Highest education level: Not on file  Occupational History   Not on file  Tobacco Use   Smoking status: Never   Smokeless tobacco: Never  Vaping Use   Vaping Use: Never used  Substance and Sexual Activity   Alcohol use: Never   Drug use: Never   Sexual activity: Never  Other Topics Concern   Not on file  Social History Narrative   01/23/21   From: the area   Living: with parents   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, 1 sibling      Enjoys: running       Exercise: daily running    Diet: meat, veggies, grains      Safety   Seat belts: Yes    Guns: No   Safe in relationships: Yes    Social Determinants of Corporate investment banker Strain: Not on file  Food Insecurity: Not on file  Transportation Needs: Not on file  Physical Activity: Not on file  Stress: Not on file  Social Connections: Not on file   Additional Social History:                         Sleep: Fair  Appetite:  Good  Current Medications: Current Facility-Administered Medications  Medication Dose Route Frequency Provider Last Rate Last Admin   acetaminophen (TYLENOL) tablet 650 mg  650 mg Oral Q6H PRN Laveda Abbe, NP       alum & mag hydroxide-simeth (MAALOX/MYLANTA) 200-200-20 MG/5ML suspension 30 mL  30 mL Oral Q4H PRN Laveda Abbe, NP       benztropine (COGENTIN) tablet 1 mg  1 mg Oral QHS Laveda Abbe, NP   1 mg at 11/12/21 2117   hydrOXYzine (ATARAX) tablet 25 mg  25 mg Oral TID PRN Laveda Abbe, NP       risperiDONE (RISPERDAL M-TABS) disintegrating tablet 2 mg  2 mg Oral Q8H PRN Laveda Abbe, NP       And   LORazepam (ATIVAN) tablet 1 mg  1 mg Oral PRN Laveda Abbe, NP       And   ziprasidone (GEODON) injection 20 mg  20 mg Intramuscular PRN Laveda Abbe, NP       magnesium hydroxide (MILK OF MAGNESIA) suspension 30 mL  30 mL Oral Daily PRN Laveda Abbe, NP       paliperidone (INVEGA) 24 hr tablet 3 mg  3 mg Oral Once Bobbye Morton, MD       [START ON 11/14/2021] paliperidone (INVEGA) 24 hr tablet 9 mg  9 mg Oral Daily Eliseo Gum B, MD       traZODone (DESYREL) tablet 50 mg  50 mg Oral QHS PRN Laveda Abbe, NP   50 mg at 11/12/21 2117    Lab Results: No results found for this or any previous visit (from the past 48 hour(s)).  Blood Alcohol level:  Lab Results  Component Value Date   [facility] <10 11/08/2021   ETH <10 08/07/2019    Metabolic Disorder  Labs: Lab Results  Component Value Date   HGBA1C 5.0 08/24/2018   MPG 97 08/24/2018   Lab Results  Component Value Date   PROLACTIN 10.0 08/24/2018   Lab Results  Component Value Date   CHOL 113 08/24/2018   TRIG 41 08/24/2018   HDL 47 08/24/2018  CHOLHDL 2.4 08/24/2018   VLDL 8 08/24/2018   LDLCALC 58 08/24/2018    Physical Findings: AIMS: Facial and Oral Movements Muscles of Facial Expression: None, normal Lips and Perioral Area: None, normal Jaw: None, normal Tongue: None, normal,Extremity Movements Upper (arms, wrists, hands, fingers): None, normal Lower (legs, knees, ankles, toes): None, normal, Trunk Movements Neck, shoulders, hips: None, normal, Overall Severity Severity of abnormal movements (highest score from questions above): None, normal Incapacitation due to abnormal movements: None, normal Patient's awareness of abnormal movements (rate only patient's report): No Awareness, Dental Status Current problems with teeth and/or dentures?: No Does patient usually wear dentures?: No  CIWA:    COWS:     Musculoskeletal: Strength & Muscle Tone: within normal limits Gait & Station: normal Patient leans: N/A  Psychiatric Specialty Exam:  Presentation  General Appearance: Appropriate for Environment  Eye Contact:Good  Speech:Clear and Coherent  Speech Volume:Normal  Handedness:Right   Mood and Affect  Mood:Euthymic  Affect:Appropriate   Thought Process  Thought Processes:Goal Directed  Descriptions of Associations:Intact  Orientation:Full (Time, Place and Person)  Thought Content:-- (hyperreligious)  History of Schizophrenia/Schizoaffective disorder:No  Duration of Psychotic Symptoms:No data recorded Hallucinations:Hallucinations: None  Ideas of Reference:Percusatory  Suicidal Thoughts:Suicidal Thoughts: No  Homicidal Thoughts:Homicidal Thoughts: No   Sensorium  Memory:Immediate Good; Recent  Good  Judgment:Good  Insight:Shallow   Executive Functions  Concentration:Good  Attention Span:Good  Recall:Good  Fund of Knowledge:Good  Language:Good   Psychomotor Activity  Psychomotor Activity:Psychomotor Activity: Normal   Assets  Assets:Communication Skills; Housing; Resilience; Social Support   Sleep  Sleep:Sleep: Good    Physical Exam: Physical Exam Constitutional:      Appearance: Normal appearance.  HENT:     Head: Normocephalic and atraumatic.  Pulmonary:     Effort: Pulmonary effort is normal.  Neurological:     Mental Status: He is alert.   Review of Systems  Psychiatric/Behavioral:  Negative for hallucinations and suicidal ideas.   Blood pressure 128/83, pulse 77, temperature 97.7 F (36.5 C), temperature source Oral, resp. rate 18, height 6' (1.829 m), weight 68 kg, SpO2 100 %. Body mass index is 20.34 kg/m.   Treatment Plan Summary: Daily contact with patient to assess and evaluate symptoms and progress in treatment and Medication management  Mario Thomas is a 20 year old patient with a past psychiatric history of bipolar 1 disorder who presented as a transfer from the [facility] to [facility] with IVC in place concerning for manic behavior.   Patient appears to be sleeping well and continues to deny command auditory hallucinations.  Patient does not appear to be responding to internal stimuli.  Unfortunately patient appears to be hyperreligious.  Patient is displaying poor insight and judgment regarding his own mental health, but appears to be doing well on the [address] and displays decent judgment regarding interactions in the milieu.  Patient's interactions with provider remain a bit guarded; however, patient appears to be becoming more comfortable with provider.  There is some concern that patient's vagueness is secondary to poor thought process and possibly thought blocking.  Patient appears to have more of the thought disorder and concern for  schizoaffective disorder is growing.   Bipolar 1 disorder, current episode, manic to be/with psychotic features (R/O bipolar 1 disorder, current episode manic W/psychotic features VS schizoaffective disorder, bipolar type VS schizophrenia, unspecified) Plan Continue IVC - Increase paliperidone to 9 mg - Continue home Cogentin 1 mg nightly -Agitation protocol: Risperdal 2 mg q.  8 H as needed w/  Ativan 1 mg as needed w/ Geodon 20 mg IM   PRN -Tylenol 650mg  q6h, pain -Maalox 21ml q4h, indigestion -Atarax 25mg  TID, anxiety -Milk of Mag 64mL, constipation -Trazodone 50mg  QHS, insomnia  Recommend observation level: Routine every 15 minute checks   PGY-2 , MD 11/13/2021, 11:43 AM

## 2021-11-13 NOTE — Group Note (Signed)
Occupational Therapy Group Note  Group Topic:Feelings Management  Group Date: 11/13/2021 Start Time: 1400 End Time: 1445 Facilitators: Aleric Froelich, OT    Group Description: Group encouraged increased engagement and participation through discussion focused on Self-Care. Group members reviewed and identified specific categories of self-care including physical, emotional, social, spiritual, and professional self-care, identifying some of their current strengths. Discussion then transitioned into focusing on areas of improvement and brainstormed strategies and tips to improve in these areas of self-care. Discussion also identified impact of mental health on self-care practices.   Therapeutic Goal(s): Identify self-care areas of strength Identify self-care areas of improvement Identify and engage in activities to improve overall self-care  Participation Level: Group ran for ~10 minutes due to SW Group running late and patients going outside/getting snack. Patients were provided with group worksheet on self-care and directions explained by this writer. Pt encouraged to complete worksheet on their own and writer remained in group space for questions.  Modes of Intervention: Activity and Education  Plan: Continue to engage patient in OT groups 2 - 3x/week.  11/13/2021  Maddax Palinkas, OT 

## 2021-11-14 NOTE — [facility] Group Notes (Signed)
Adult Psychoeducational Group Note  Date:  11/14/2021 Time:  9:17 PM  Group Topic/Focus:  Coping With Mental Health Crisis:   The purpose of this group is to help patients identify strategies for coping with mental health crisis.  Group discusses possible causes of crisis and ways to manage them effectively.  Participation Level:  Active  Participation Quality:  Attentive  Affect:  Appropriate  Cognitive:  Alert  Insight: Good  Engagement in Group:  Engaged  Modes of Intervention:  Discussion  Additional Comments  Jacalyn Lefevre 11/14/2021, 9:17 PM

## 2021-11-14 NOTE — Progress Notes (Addendum)
[facility] MD Progress Note  11/14/2021 6:40 PM Mario Thomas  MRN:  [id number] Subjective:  Mario Thomas is a 20 year old patient with a past psychiatric history of bipolar disorder and a prior [facility] when he was 20 years old.  Patient presented for this hospitalization during reported manic episode, current diagnosis this hospitalization is bipolar 1, current episode, manic.   Case was discussed in the multidisciplinary team. MAR was reviewed and patient was compliant with medications.  He did not require any PRN's for agitation. Patient was noted by staff to be hypereligious yesterday afternoon. [name] has also been noted to be more intrusive with staff.    Psychiatric Team made the following recommendations yesterday:  Continue IVC - Increase paliperidone to 9 mg - Continue home Cogentin 1 mg nightly -Agitation protocol: Risperdal 2 mg q.  8 H as needed w/ Ativan 1 mg as needed w/ Geodon 20 mg IM  On assessment the AM patient reports that he slept well. Patient reports that he continues to eat well and also speaks with his family. Patient reports that he still wants to go stay in a hotel and will think about what to do after he runs out of money. Patient reports that he has also received info from SW to regarding boarding houses, but he has not thought much about this and reports he would rather think after going to a hotel. Patient reports that God is not telling him what to do, but he does pray about things. Patient reports that he only prays about 1 time/ day. Patient reports that God is not telling him to go stay in the hotel, but he does not feel "it is healthy" returning home. Patient does not give specific examples, but reports that he thinks that he should not return home. Patient denied SI, HI, AVH including command hallucinations, paranoia, thought broadcasting, or thought insertions/withdrawal or ideas of  reference.   Patient was approached by providers about his lab work and wanting to get repeat. Patient has endorsed previously that he does not like blood draws. Patient reports that he is not sure he wants to get lab work. Providers expressed that this lab work would be needed for monitoring of metabolic symptoms due to his need for SGA and for monitoring of WBC based on leukopenia noted on admission. Patient appeared very limited when discussing this only maintaining, that he did not "feel comfortable" and was not able to give a specific reason why, but endorsed that he wanted to spend the day thinking about this.   Principal Problem: Bipolar I disorder, current or most recent episode manic, with psychotic features (HCC) Diagnosis: Principal Problem:   Bipolar I disorder, current or most recent episode manic, with psychotic features (HCC)  Total Time Spent in Direct Patient Care:  I personally spent 25 minutes on the unit in direct patient care. The direct patient care time included face-to-face time with the patient, reviewing the patient's chart, communicating with other professionals, and coordinating care. Greater than 50% of this time was spent in counseling or coordinating care with the patient regarding goals of hospitalization, psycho-education, and discharge planning needs.  Past Psychiatric History: See H&P  Past Medical History:  Past Medical History:  Diagnosis Date   Heart murmur    MDD (major depressive disorder), severe (HCC) 08/23/2018   History reviewed. No pertinent surgical history. Family History:  Family History  Problem Relation Age of Onset  Hyperlipidemia Father    Family Psychiatric  History: See H&P Social History:  Social History   Substance and Sexual Activity  Alcohol Use Never     Social History   Substance and Sexual Activity  Drug Use Never    Social History   Socioeconomic History   Marital status: Single    Spouse name: Not on file   Number  of children: Not on file   Years of education: Not on file   Highest education level: Not on file  Occupational History   Not on file  Tobacco Use   Smoking status: Never   Smokeless tobacco: Never  Vaping Use   Vaping Use: Never used  Substance and Sexual Activity   Alcohol use: Never   Drug use: Never   Sexual activity: Never  Other Topics Concern   Not on file  Social History Narrative   01/23/21   From: the area   Living: with parents   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, 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      Enjoys: running      Exercise: daily running    Diet: meat, veggies, grains      Safety   Seat belts: Yes    Guns: No   Safe in relationships: Yes    Social Determinants of Corporate investment banker Strain: Not on file  Food Insecurity: Not on file  Transportation Needs: Not on file  Physical Activity: Not on file  Stress: Not on file  Social Connections: Not on file    Sleep: Good  Appetite:  Good  Current Medications: Current Facility-Administered Medications  Medication Dose Route Frequency Provider Last Rate Last Admin   acetaminophen (TYLENOL) tablet 650 mg  650 mg Oral Q6H PRN Laveda Abbe, NP       alum & mag hydroxide-simeth (MAALOX/MYLANTA) 200-200-20 MG/5ML suspension 30 mL  30 mL Oral Q4H PRN Laveda Abbe, NP       benztropine (COGENTIN) tablet 1 mg  1 mg Oral QHS Laveda Abbe, NP   1 mg at 11/13/21 2150   hydrOXYzine (ATARAX) tablet 25 mg  25 mg Oral TID PRN Laveda Abbe, NP       risperiDONE (RISPERDAL M-TABS) disintegrating tablet 2 mg  2 mg Oral Q8H PRN Laveda Abbe, NP       And   LORazepam (ATIVAN) tablet 1 mg  1 mg Oral PRN Laveda Abbe, NP       And   ziprasidone (GEODON) injection 20 mg  20 mg Intramuscular PRN Laveda Abbe, NP       magnesium hydroxide (MILK OF MAGNESIA) suspension 30 mL  30 mL Oral Daily PRN Laveda Abbe, NP       paliperidone (INVEGA) 24 hr tablet 9 mg  9 mg Oral Daily Eliseo Gum B, MD   9 mg at 11/14/21 0820   traZODone (DESYREL) tablet 50 mg  50 mg Oral QHS PRN Laveda Abbe, NP   50 mg at 11/13/21 2150    Lab Results: No results found for this or any previous visit (from the past 48 hour(s)).  Blood Alcohol level:  Lab Results  Component Value Date   [facility] <10 11/08/2021   ETH <10 08/07/2019    Metabolic Disorder Labs: Lab Results  Component Value Date   HGBA1C 5.0 08/24/2018   MPG 97 08/24/2018  Lab Results  Component Value Date   PROLACTIN 10.0 08/24/2018   Lab Results  Component Value Date   CHOL 113 08/24/2018   TRIG 41 08/24/2018   HDL 47 08/24/2018   CHOLHDL 2.4 08/24/2018   VLDL 8 08/24/2018   LDLCALC 58 08/24/2018    Physical Findings: AIMS: Facial and Oral Movements Muscles of Facial Expression: None, normal Lips and Perioral Area: None, normal Jaw: None, normal Tongue: None, normal,Extremity Movements Upper (arms, wrists, hands, fingers): None, normal Lower (legs, knees, ankles, toes): None, normal, Trunk Movements Neck, shoulders, hips: None, normal, Overall Severity Severity of abnormal movements (highest score from questions above): None, normal Incapacitation due to abnormal movements: None, normal Patient's awareness of abnormal movements (rate only patient's report): No Awareness, Dental Status Current problems with teeth and/or dentures?: No Does patient usually wear dentures?: No      Musculoskeletal: Strength & Muscle Tone: within normal limits Gait & Station: normal Patient leans: N/A  Psychiatric Specialty Exam:  Presentation  General Appearance: Appropriate for environment, casually dressed, fair hygiene  Eye Contact:Good  Speech:Clear and Coherent  Speech Volume:Normal  Handedness:Right   Mood and Affect  Mood:aloof  Affect: guarded  Thought Process  Thought Processes:Goal Directed (still  very vague when answering questions)  Descriptions of Associations:intact  Orientation:oriented to self, month, year and President  Thought Content:denies AVH, first rank symptoms or ideas of reference and is not grossly responding to internal/external stimuli on exam; does smirk at inappropriate times during interview; denies hyper-religious thinking today - remains guarded but denies paranoia  History of Schizophrenia/Schizoaffective disorder:No  Hallucinations:Hallucinations: None  Ideas of Reference:Denied  Suicidal Thoughts:Suicidal Thoughts: No  Homicidal Thoughts:Homicidal Thoughts: No   Sensorium  Memory:Immediate Good; Recent Good  Judgment:Impaired  Insight:Shallow   Executive Functions  Concentration:Fair  Attention Span:Fair  Recall:Good  Fund of Knowledge:Good  Language:Good   Psychomotor Activity  Psychomotor Activity:Psychomotor Activity: Normal   Assets  Assets:Communication Skills; Social Support; Resilience   Sleep  Sleep:time unrecorded   Physical Exam HENT:     Head: Normocephalic and atraumatic.  Pulmonary:     Effort: Pulmonary effort is normal.  Neurological:     General: No focal deficit present.     Mental Status: He is alert.   Review of Systems  Respiratory:  Negative for shortness of breath.   Cardiovascular:  Negative for chest pain.  Gastrointestinal:  Negative for constipation, diarrhea, nausea and vomiting.  Psychiatric/Behavioral:  Negative for hallucinations and suicidal ideas.   Blood pressure 133/72, pulse 95, temperature 98.1 F (36.7 C), temperature source Oral, resp. rate 18, height 6' (1.829 m), weight 68 kg, SpO2 100 %. Body mass index is 20.34 kg/m.   Treatment Plan Summary: Daily contact with patient to assess and evaluate symptoms and progress in treatment and Medication management Hope Cabezas is a 20 year old patient with a past psychiatric history of bipolar 1 disorder who presented as a transfer from  the [facility] to [facility] with IVC in place concerning for manic behavior.  Patient continues to appear act a bit bizarrely on assessment. Patient remains a bit vague and initially this was interpreted as guarded but it appears that patient's thought process is overall goal oriented, but patient is struggling to provide reason behind some of his "choices" or statements. Patient appears to be more intrusive with staff, and provider has noticed that patient does not talk very much with other patient's but is seen out on the hall. Patient appears to have more of the thought  disorder and concern for schizoaffective disorder or primary psychotic d/o is growing.  Bipolar 1 disorder, current episode, manic with psychotic features (R/O schizoaffective disorder, bipolar type VS schizophrenia, unspecified) - Continue paliperidone 9 mg - A1c and lipid pending when patient will cooperate for testing; QTC - Continue home Cogentin 1 mg nightly -Agitation protocol: Risperdal 2 mg q.  8 H as needed w/ Ativan 1 mg as needed w/ Geodon 20 mg IM  - TSH pending - Repeat EKG in AM for trending of QTC while on Invega  Leukopenia - WBC 3.5 on admission without diff - attempting to repeat CBC when he will cooperate for monitoring of WBC and ANC while on an antipsychotic   PRN -Tylenol 650mg  q6h, pain -Maalox 26ml q4h, indigestion -Atarax 25mg  TID, anxiety -Milk of Mag 73mL, constipation -Trazodone 50mg  QHS, insomnia   PGY-2 , MD 11/14/2021, 6:40 PM  I have independently evaluated the patient during a face-to-face assessment on 11/14/21. I reviewed the patient's chart, and I participated in key portions of the service. I discussed the case with the Eliseo Gum, and I agree with the assessment and plan of care as documented in the House Officer's note.   I personally spent 30 minutes on the unit in direct patient care. The direct patient care time included face-to-face time with the patient, reviewing the  patient's chart, communicating with other professionals, and coordinating care. Greater than 50% of this time was spent in counseling or coordinating care with the patient regarding goals of hospitalization, psycho-education, and discharge planning needs.  On my assessment, patient reports belief that "God works through people" but no longer believes in external sense of control by God or others. He denies AVH, ideas of reference or first rank symptoms. He expressed desire to consider boarding house options after discharge. Will continue to monitor on present regimen. He was advised that his WBC was low on admission and repeat CBC, lipid, A1c and TSH have been ordered. He was encouraged to comply with labs. 14/05/2021, MD, 14/06/22

## 2021-11-14 NOTE — Group Note (Signed)
Date:  11/14/2021 Time:  9:13 AM  Group Topic/Focus:  Orientation:   The focus of this group is to educate the patient on the purpose and policies of crisis stabilization and provide a format to answer questions about their admission.  The group details unit policies and expectations of patients while admitted.    Participation Level:  Active  Participation Quality:  Appropriate  Affect:  Appropriate  Cognitive:  Appropriate  Insight: Appropriate  Engagement in Group:  Engaged  Modes of Intervention:  Discussion  Additional Comments:  Pt want to be calm and at peace.  Jaquita Rector 11/14/2021, 9:13 AM

## 2021-11-14 NOTE — Progress Notes (Addendum)
   11/14/21 0820  Psych Admission Type (Psych Patients Only)  Admission Status Involuntary  Psychosocial Assessment  Patient Complaints Anxiety;Depression  Eye Contact Brief;Avertive  Facial Expression Flat  Affect Flat;Appropriate to circumstance  Speech Logical/coherent  Interaction Assertive;Minimal;Guarded  Motor Activity Other (Comment) (WDL)  Appearance/Hygiene Unremarkable  Behavior Characteristics Cooperative  Mood Anxious  Thought Process  Coherency Concrete thinking;Blocking  Content WDL  Delusions Religious  Perception WDL  Hallucination None reported or observed  Judgment Poor  Confusion None  Danger to Self  Current suicidal ideation? Denies  Danger to Others  Danger to Others None reported or observed   D: Patient denies SI/HI/AVH. Patient rated anxiety 2/10 and depression 1/10. Pt. Was on the phone saying "have you prayed?" "Have you asked for deliverance?" "Can you bring me pizza from [facility]? Yes, they said it was ok." A:  Patient took scheduled medicine.  Support and encouragement provided Routine safety checks conducted every 15 minutes. Patient  Informed to notify staff with any concerns.   R:  Safety maintained.   Late note @1922  Pt. Was in the hall way with hands clasped in front of him praying (ministering) with male pt. Loudly.

## 2021-11-14 NOTE — Progress Notes (Signed)
   11/14/21 2113  Psych Admission Type (Psych Patients Only)  Admission Status Involuntary  Psychosocial Assessment  Patient Complaints Anxiety  Eye Contact Brief;Avertive  Facial Expression Blank;Flat  Affect Appropriate to circumstance  Speech Logical/coherent  Interaction Assertive;Manipulative;Isolative  Motor Activity Other (Comment) (WDL)  Appearance/Hygiene Unremarkable  Behavior Characteristics Cooperative  Mood Preoccupied  Thought Process  Coherency Concrete thinking;Blocking  Content Religiosity;Preoccupation  Delusions Religious  Perception WDL  Hallucination None reported or observed  Judgment Poor  Confusion None  Danger to Self  Current suicidal ideation? Denies  Danger to Others  Danger to Others None reported or observed

## 2021-11-14 NOTE — Group Note (Signed)
Recreation Therapy Group Note   Group Topic:Animal Assisted Therapy   Group Date: 11/14/2021 Start Time: 1430 End Time: 1515 Facilitators: Caroll Rancher, LRT,CTRS Location: [address]   AAA/T Program Assumption of Risk Form signed by Patient/ or Parent Legal Guardian Yes  Patient is free of allergies or severe asthma Yes  Patient reports no fear of animals Yes  Patient reports no history of cruelty to animals Yes  Patient understands his/her participation is voluntary Yes  Patient washes hands before animal contact Yes  Patient washes hands after animal contact Yes   Affect/Mood: Appropriate   Participation Level: Active   Participation Quality: Independent   Behavior: Appropriate   Speech/Thought Process: Focused   Insight: Good   Judgement: Good   Modes of Intervention: Teaching laboratory technician   Patient Response to Interventions:  Attentive   Education Outcome:  Acknowledges education and In group clarification offered    Clinical Observations/Individualized Feedback:  Patient attended session and interacted appropriately with therapy dog and peers. Patient asked appropriate questions about therapy dog and his training. Patient shared stories about their pets at home with group.    Plan: Continue to engage patient in RT group sessions 2-3x/week.   Caroll Rancher, Antonietta Jewel 11/14/2021 3:53 PM

## 2021-11-15 ENCOUNTER — Encounter (HOSPITAL_COMMUNITY): Payer: Self-pay

## 2021-11-15 LAB — CBC WITH DIFFERENTIAL/PLATELET
Abs Immature Granulocytes: 0.09 10*3/uL — ABNORMAL HIGH (ref 0.00–0.07)
Basophils Absolute: 0.1 10*3/uL (ref 0.0–0.1)
Basophils Relative: 1 %
Eosinophils Absolute: 0.1 10*3/uL (ref 0.0–0.5)
Eosinophils Relative: 2 %
HCT: 45.9 % (ref 39.0–52.0)
Hemoglobin: 14.9 g/dL (ref 13.0–17.0)
Immature Granulocytes: 1 %
Lymphocytes Relative: 35 %
Lymphs Abs: 2.7 10*3/uL (ref 0.7–4.0)
MCH: 30 pg (ref 26.0–34.0)
MCHC: 32.5 g/dL (ref 30.0–36.0)
MCV: 92.4 fL (ref 80.0–100.0)
Monocytes Absolute: 0.6 10*3/uL (ref 0.1–1.0)
Monocytes Relative: 8 %
Neutro Abs: 4 10*3/uL (ref 1.7–7.7)
Neutrophils Relative %: 53 %
Platelets: 222 10*3/uL (ref 150–400)
RBC: 4.97 MIL/uL (ref 4.22–5.81)
RDW: 12.7 % (ref 11.5–15.5)
WBC: 7.6 10*3/uL (ref 4.0–10.5)
nRBC: 0 % (ref 0.0–0.2)

## 2021-11-15 LAB — LIPID PANEL
Cholesterol: 131 mg/dL (ref 0–200)
HDL: 50 mg/dL (ref >40)
LDL Cholesterol: 41 mg/dL (ref 0–99)
Total CHOL/HDL Ratio: 2.6 RATIO
Triglycerides: 202 mg/dL — ABNORMAL HIGH (ref <150)
VLDL: 40 mg/dL (ref 0–40)

## 2021-11-15 LAB — HEMOGLOBIN A1C
Hgb A1c MFr Bld: 5 % (ref 4.8–5.6)
Mean Plasma Glucose: 96.8 mg/dL

## 2021-11-15 LAB — TSH: TSH: 2.949 u[IU]/mL (ref 0.350–4.500)

## 2021-11-15 NOTE — BH IP Treatment Plan (Signed)
Interdisciplinary Treatment and Diagnostic Plan Update  11/15/2021 Time of Session: 10:10am  Mario Thomas MRN: [id number]  Principal Diagnosis: Bipolar I disorder, current or most recent episode manic, with psychotic features (HCC)  Secondary Diagnoses: Principal Problem:   Bipolar I disorder, current or most recent episode manic, with psychotic features (HCC)   Current Medications:  Current Facility-Administered Medications  Medication Dose Route Frequency Provider Last Rate Last Admin   acetaminophen (TYLENOL) tablet 650 mg  650 mg Oral Q6H PRN Laveda Abbe, NP       alum & mag hydroxide-simeth (MAALOX/MYLANTA) 200-200-20 MG/5ML suspension 30 mL  30 mL Oral Q4H PRN Laveda Abbe, NP       benztropine (COGENTIN) tablet 1 mg  1 mg Oral QHS Laveda Abbe, NP   1 mg at 11/14/21 2113   hydrOXYzine (ATARAX) tablet 25 mg  25 mg Oral TID PRN Laveda Abbe, NP       risperiDONE (RISPERDAL M-TABS) disintegrating tablet 2 mg  2 mg Oral Q8H PRN Laveda Abbe, NP       And   LORazepam (ATIVAN) tablet 1 mg  1 mg Oral PRN Laveda Abbe, NP       And   ziprasidone (GEODON) injection 20 mg  20 mg Intramuscular PRN Laveda Abbe, NP       magnesium hydroxide (MILK OF MAGNESIA) suspension 30 mL  30 mL Oral Daily PRN Laveda Abbe, NP       paliperidone (INVEGA) 24 hr tablet 9 mg  9 mg Oral Daily Eliseo Gum B, MD   9 mg at 11/15/21 0813   traZODone (DESYREL) tablet 50 mg  50 mg Oral QHS PRN Laveda Abbe, NP   50 mg at 11/14/21 2113   PTA Medications: Medications Prior to Admission  Medication Sig Dispense Refill Last Dose   ARIPiprazole (ABILIFY) 10 MG tablet Take 10 mg by mouth at bedtime. (Patient not taking: Reported on 11/08/2021)      benztropine (COGENTIN) 1 MG tablet Take 1 mg by mouth at bedtime. (Patient not taking: Reported on 11/08/2021)       Patient Stressors: Educational concerns   Health problems   Medication  change or noncompliance   Traumatic event    Patient Strengths: Ability for insight  Average or above average intelligence  Communication skills  Supportive family/friends  Work skills   Treatment Modalities: Medication Management, Group therapy, Case management,  1 to 1 session with clinician, Psychoeducation, Recreational therapy.   Physician Treatment Plan for Primary Diagnosis: Bipolar I disorder, current or most recent episode manic, with psychotic features (HCC) Long Term Goal(s): Improvement in symptoms so as ready for discharge   Short Term Goals: Ability to identify changes in lifestyle to reduce recurrence of condition will improve Ability to verbalize feelings will improve Ability to demonstrate self-control will improve Ability to identify and develop effective coping behaviors will improve Ability to maintain clinical measurements within normal limits will improve Compliance with prescribed medications will improve Ability to disclose and discuss suicidal ideas  Medication Management: Evaluate patient's response, side effects, and tolerance of medication regimen.  Therapeutic Interventions: 1 to 1 sessions, Unit Group sessions and Medication administration.  Evaluation of Outcomes: Progressing  Physician Treatment Plan for Secondary Diagnosis: Principal Problem:   Bipolar I disorder, current or most recent episode manic, with psychotic features (HCC)  Long Term Goal(s): Improvement in symptoms so as ready for discharge   Short Term Goals: Ability to identify  changes in lifestyle to reduce recurrence of condition will improve Ability to verbalize feelings will improve Ability to demonstrate self-control will improve Ability to identify and develop effective coping behaviors will improve Ability to maintain clinical measurements within normal limits will improve Compliance with prescribed medications will improve Ability to disclose and discuss suicidal ideas      Medication Management: Evaluate patient's response, side effects, and tolerance of medication regimen.  Therapeutic Interventions: 1 to 1 sessions, Unit Group sessions and Medication administration.  Evaluation of Outcomes: Progressing   RN Treatment Plan for Primary Diagnosis: Bipolar I disorder, current or most recent episode manic, with psychotic features (HCC) Long Term Goal(s): Knowledge of disease and therapeutic regimen to maintain health will improve  Short Term Goals: Ability to participate in decision making will improve, Ability to verbalize feelings will improve, Ability to disclose and discuss suicidal ideas, and Ability to identify and develop effective coping behaviors will improve  Medication Management: RN will administer medications as ordered by provider, will assess and evaluate patient's response and provide education to patient for prescribed medication. RN will report any adverse and/or side effects to prescribing provider.  Therapeutic Interventions: 1 on 1 counseling sessions, Psychoeducation, Medication administration, Evaluate responses to treatment, Monitor vital signs and CBGs as ordered, Perform/monitor CIWA, COWS, AIMS and Fall Risk screenings as ordered, Perform wound care treatments as ordered.  Evaluation of Outcomes: Progressing   LCSW Treatment Plan for Primary Diagnosis: Bipolar I disorder, current or most recent episode manic, with psychotic features (HCC) Long Term Goal(s): Safe transition to appropriate next level of care at discharge, Engage patient in therapeutic group addressing interpersonal concerns.  Short Term Goals: Engage patient in aftercare planning with referrals and resources, Increase social support, Increase emotional regulation, Facilitate acceptance of mental health diagnosis and concerns, Identify triggers associated with mental health/substance abuse issues, and Increase skills for wellness and recovery  Therapeutic Interventions:  Assess for all discharge needs, 1 to 1 time with Social worker, Explore available resources and support systems, Assess for adequacy in community support network, Educate family and significant other(s) on suicide prevention, Complete Psychosocial Assessment, Interpersonal group therapy.  Evaluation of Outcomes: Progressing   Progress in Treatment: Attending groups: Yes. Participating in groups: Yes. Taking medication as prescribed: Yes. Toleration medication: Yes. Family/Significant other contact made: No, will contact:  Declined Consents  Patient understands diagnosis: Yes. Discussing patient identified problems/goals with staff: Yes. Medical problems stabilized or resolved: Yes. Denies suicidal/homicidal ideation: Yes. Issues/concerns per patient self-inventory: No.   New problem(s) identified: No, Describe:  None   New Short Term/Long Term Goal(s): medication stabilization, elimination of SI thoughts, development of comprehensive mental wellness plan.   Patient Goals: "To recover and have a peace of mind"   Discharge Plan or Barriers: Patient recently admitted. CSW will continue to follow and assess for appropriate referrals and possible discharge planning.   Reason for Continuation of Hospitalization: Delusions  Medication stabilization  Estimated Length of Stay: 3 to 5 days   Scribe for Treatment Team: Aram Beecham, Theresia Majors 11/15/2021 3:08 PM

## 2021-11-15 NOTE — Progress Notes (Addendum)
[facility] MD Progress Note  11/15/2021 11:10 AM Mario Thomas  MRN:  [id number] Subjective:  Mario Thomas is a 20 year old patient with a past psychiatric history of bipolar disorder and a [facility] when he was 20 years old.  Patient presented for this hospitalization during reported manic episode, current diagnosis this hospitalization is bipolar 1, current episode, manic.   Case was discussed in the multidisciplinary team. MAR was reviewed and patient was compliant with medications.  He did not require any PRN's for agitation. Patient was noted by staff to be hypereligious again yesterday afternoon on the phone. [name] has also been noted hover around or seek this provider when provider is on the floor in the AM.   On assessment this AM patient reports that he is doing "alright." Patient reports he did not get his PM labs because he was afraid that they would hurt him again by not finding his veins. Patient reports that this was his choice. Attending provider discussed with patient that it is necessary to get labs and patient ultimately agreed he would get the labs if someone was with him. Patient denies SI, HI, and AVH. Patient reports that he has been talking to his parents because he is bored and does not feel there is much to do.He continues to express belief that it is unsafe for him to live with parents after discharge and has some residual persecutory beliefs about living with parents. Patient reports he feels "denied" of his usual outlets such as music. Patient reports he continues to get distracted easily and it is hard to focus on task, but denies command hallucinations. Patient reports that despite his frequent calls to his parents he still does not believe it is safe to go stay with his parents. Patient reports he would still like to go spend his money in a hotel for a few days and then think about a plan.    Collateral, mom: Mom reports that the patient has been hyper-religious on the phone. Parents reports that the patient has been calling frequently and that he may be sad. Patient also believes that's his parents are "against him." Mom reports that the patient does go to church weekly, by himself even when the family does not.  Mom reports that patient will pray when he is troubled. Mom reports that he fasts for weekends or Sunday's and when only take water, [facility]). Mom endorses that patient is very religious at baseline. Mom reports she does not fast at much, she does fast occasionally. Patients' family is [place]. Mom reports she also has leukopenia.Mom reports that patient was dx with ADHD as a child, dx in [place]. Parents did not get medication. Patient moved to the [place] when he was 20 yo.  Principal Problem: Bipolar I disorder, current or most recent episode manic, with psychotic features (HCC) Diagnosis: Principal Problem:   Bipolar I disorder, current or most recent episode manic, with psychotic features (HCC)  Total Time Spent in Direct Patient Care:  I personally spent 25 minutes on the unit in direct patient care. The direct patient care time included face-to-face time with the patient, reviewing the patient's chart, communicating with other professionals, and coordinating care. Greater than 50% of this time was spent in counseling or coordinating care with the patient regarding goals of hospitalization, psycho-education, and discharge planning needs.  Past Psychiatric History: See H&P  Past Medical History:  Past Medical History:  Diagnosis Date   Heart murmur    MDD (major depressive disorder), severe (HCC) 08/23/2018   History reviewed. No pertinent surgical history. Family History:  Family History  Problem Relation Age of Onset   Hyperlipidemia Father    Family Psychiatric  History: See H&P Social History:  Social History   Substance and Sexual Activity   Alcohol Use Never     Social History   Substance and Sexual Activity  Drug Use Never    Social History   Socioeconomic History   Marital status: Single    Spouse name: Not on file   Number of children: Not on file   Years of education: Not on file   Highest education level: Not on file  Occupational History   Not on file  Tobacco Use   Smoking status: Never   Smokeless tobacco: Never  Vaping Use   Vaping Use: Never used  Substance and Sexual Activity   Alcohol use: Never   Drug use: Never   Sexual activity: Never  Other Topics Concern   Not on file  Social History Narrative   01/23/21   From: the area   Living: with parents   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, 1 sibling      Enjoys: running      Exercise: daily running    Diet: meat, veggies, grains      Safety   Seat belts: Yes    Guns: No   Safe in relationships: Yes    Social Determinants of Corporate investment banker Strain: Not on file  Food Insecurity: Not on file  Transportation Needs: Not on file  Physical Activity: Not on file  Stress: Not on file  Social Connections: Not on file      Sleep: Good  Appetite:  Good  Current Medications: Current Facility-Administered Medications  Medication Dose Route Frequency Provider Last Rate Last Admin   acetaminophen (TYLENOL) tablet 650 mg  650 mg Oral Q6H PRN Laveda Abbe, NP       alum & mag hydroxide-simeth (MAALOX/MYLANTA) 200-200-20 MG/5ML suspension 30 mL  30 mL Oral Q4H PRN Laveda Abbe, NP       benztropine (COGENTIN) tablet 1 mg  1 mg Oral QHS Laveda Abbe, NP   1 mg at 11/14/21 2113   hydrOXYzine (ATARAX) tablet 25 mg  25 mg Oral TID PRN Laveda Abbe, NP       risperiDONE (RISPERDAL M-TABS) disintegrating tablet 2 mg  2 mg Oral Q8H PRN Laveda Abbe, NP       And   LORazepam (ATIVAN) tablet 1 mg  1 mg Oral PRN Laveda Abbe, NP       And   ziprasidone (GEODON) injection 20 mg  20 mg Intramuscular PRN Laveda Abbe, NP       magnesium hydroxide (MILK OF MAGNESIA) suspension 30 mL  30 mL Oral Daily PRN Laveda Abbe, NP       paliperidone (INVEGA) 24 hr tablet 9 mg  9 mg Oral Daily Eliseo Gum B, MD   9 mg at 11/15/21 0813   traZODone (DESYREL) tablet 50 mg  50 mg Oral QHS PRN Laveda Abbe, NP   50 mg at 11/14/21 2113    Lab Results: No results found for this or any previous visit (from the past 48 hour(s)).  Blood Alcohol level:  Lab  Results  Component Value Date   ETH <10 11/08/2021   ETH <10 08/07/2019    Metabolic Disorder Labs: Lab Results  Component Value Date   HGBA1C 5.0 08/24/2018   MPG 97 08/24/2018   Lab Results  Component Value Date   PROLACTIN 10.0 08/24/2018   Lab Results  Component Value Date   CHOL 113 08/24/2018   TRIG 41 08/24/2018   HDL 47 08/24/2018   CHOLHDL 2.4 08/24/2018   VLDL 8 08/24/2018   LDLCALC 58 08/24/2018    Physical Findings: AIMS: Facial and Oral Movements Muscles of Facial Expression: None, normal Lips and Perioral Area: None, normal Jaw: None, normal Tongue: None, normal,Extremity Movements Upper (arms, wrists, hands, fingers): None, normal Lower (legs, knees, ankles, toes): None, normal, Trunk Movements Neck, shoulders, hips: None, normal, Overall Severity Severity of abnormal movements (highest score from questions above): None, normal Incapacitation due to abnormal movements: None, normal Patient's awareness of abnormal movements (rate only patient's report): No Awareness, Dental Status Current problems with teeth and/or dentures?: No Does patient usually wear dentures?: No  CIWA:    COWS:     Musculoskeletal: Strength & Muscle Tone: within normal limits Gait & Station: normal Patient leans: N/A  Psychiatric Specialty Exam:  Presentation  General Appearance: casually dressed, adequate hygiene  Eye  Contact:Good  Speech:Clear and Coherent  Speech Volume:Normal  Handedness:Right   Mood and Affect  Mood:aloof  Affect:constricted   Thought Process  Thought Processes:superficially goal directed but vague in responses; linear  Descriptions of Associations:intact  Orientation:Full (Time, Place and Person)  Thought Content: Denies AVH, ideas of reference, or first rank symptoms; has appeared somewhat hyper-religious on the unit per staff and collateral from mom but denies hyper-religious thinking on exam today; is not grossly responding to internal/external stimuli; has some residual paranoia about living with parents after discharge  Hallucinations:Hallucinations: None  Ideas of Reference:Denied  Suicidal Thoughts:Suicidal Thoughts: No  Homicidal Thoughts:Homicidal Thoughts: No   Sensorium  Memory:Immediate Good; Recent Good  Judgment:Impaired  Insight:Shallow   Executive Functions  Concentration:Good  Attention Span:Good  Recall:Good  Fund of Knowledge:Good  Language:Good   Psychomotor Activity  Psychomotor Activity:Normal - no cogwheeling, no stiffness, no tremor; AIMS 0   Assets  Assets:Communication Skills; Social Support; Resilience   Sleep  Time unrecorded  Physical Exam Constitutional:      Appearance: Normal appearance.  HENT:     Head: Normocephalic and atraumatic.  Pulmonary:     Effort: Pulmonary effort is normal.  Neurological:     Mental Status: He is alert and oriented to person, place, and time.   Review of Systems  Respiratory:  Negative for shortness of breath.   Cardiovascular:  Negative for chest pain.  Gastrointestinal:  Negative for diarrhea, nausea and vomiting.  Psychiatric/Behavioral:  Negative for suicidal ideas.   Blood pressure 119/62, pulse 62, temperature (!) 97.4 F (36.3 C), temperature source Oral, resp. rate 18, height 6' (1.829 m), weight 68 kg, SpO2 100 %. Body mass index is 20.34 kg/m.   Treatment  Plan Summary: Daily contact with patient to assess and evaluate symptoms and progress in treatment and Medication management Patient was a bit more reasonable today, but continues to have delusions that his family is persecuting him, but also continues to seek comfort by calling them. Despite patient being especially religious at baseline patient does appear hyperreligious even to mom, as this is what most of his phone conversations have become. Mom endorsed that she will come for visitation tonight  as she believes this may help with his delusions. Continue to wait on lab work before increasing dose.  Bipolar 1 disorder, current episode, manic with psychotic features (R/O schizoaffective disorder, bipolar type) - Continue paliperidone 9 mg - A1c and lipid pending when patient will cooperate for testing; QTC - Continue home Cogentin 1 mg nightly -Agitation protocol: Risperdal 2 mg q.  8 H as needed w/ Ativan 1 mg as needed w/ Geodon 20 mg IM  - TSH pending - Repeat EKG ordered for trending of QTC while on Invega   Leukopenia - WBC 3.5 on admission without diff - attempting to repeat CBC when he will cooperate for monitoring of WBC and ANC while on an antipsychotic    PRN -Tylenol 650mg  q6h, pain -Maalox 70ml q4h, indigestion -Atarax 25mg  TID, anxiety -Milk of Mag 13mL, constipation -Trazodone 50mg  QHS, insomnia     PGY-2 , MD 11/15/2021, 11:10 AM

## 2021-11-15 NOTE — Progress Notes (Signed)
Adult Psychoeducational Group Note  Date:  11/15/2021 Time:  8:38 PM  Group Topic/Focus:  Wrap-Up Group:   The focus of this group is to help patients review their daily goal of treatment and discuss progress on daily workbooks.  Participation Level:  Active  Participation Quality:  Appropriate  Affect:  Appropriate  Cognitive:  Appropriate  Insight: Appropriate  Engagement in Group:  Engaged  Modes of Intervention:  Discussion  Additional Comments:  Patient said his day was 7. His goal for today to be calm and not peace. He achieved his goal. Coping skills praying to Jesus.  Charna Busman Long 11/15/2021, 8:38 PM

## 2021-11-15 NOTE — Group Note (Signed)
LCSW Group Therapy Note  Group Date: 11/15/2021 Start Time: 1300 End Time: 1400   Type of Therapy and Topic:  Group Therapy - Healthy vs Unhealthy Coping Skills  Participation Level:  Active   Description of Group The focus of this group was to determine what unhealthy coping techniques typically are used by group members and what healthy coping techniques would be helpful in coping with various problems. Patients were guided in becoming aware of the differences between healthy and unhealthy coping techniques. Patients were asked to identify 2-3 healthy coping skills they would like to learn to use more effectively.  Therapeutic Goals Patients learned that coping is what human beings do all day long to deal with various situations in their lives Patients defined and discussed healthy vs unhealthy coping techniques Patients identified their preferred coping techniques and identified whether these were healthy or unhealthy Patients determined 2-3 healthy coping skills they would like to become more familiar with and use more often. Patients provided support and ideas to each other   Summary of Patient Progress:  During group the Pt expressed that using music was a helpful coping skill for them. Patient proved open to input from peers and feedback from CSW. Patient demonstrated insight into the subject matter, was respectful of peers, and participated throughout the entire session.   Therapeutic Modalities Cognitive Behavioral Therapy Motivational Interviewing  Aram Beecham, Theresia Majors 11/15/2021  2:37 PM

## 2021-11-15 NOTE — Progress Notes (Signed)
   11/15/21 0813  Psych Admission Type (Psych Patients Only)  Admission Status Involuntary  Psychosocial Assessment  Patient Complaints Anxiety;Depression  Eye Contact Brief;Avertive  Facial Expression Blank;Flat  Affect Appropriate to circumstance  Speech Logical/coherent  Interaction Assertive;Manipulative;Isolative  Motor Activity Other (Comment) (WDL)  Appearance/Hygiene Unremarkable  Behavior Characteristics Cooperative  Mood Depressed;Anxious  Thought Process  Coherency Concrete thinking;Blocking  Content Religiosity;Preoccupation  Delusions Religious  Perception WDL  Hallucination None reported or observed  Judgment Poor  Confusion None  Danger to Self  Current suicidal ideation? Denies  Danger to Others  Danger to Others None reported or observed   D: Patient denies SI/HI/AVH. Pt. Rated both anxiety and depression 1/10. A:  Patient took scheduled medicine.  Support and encouragement provided Routine safety checks conducted every 15 minutes. Patient  Informed to notify staff with any concerns.   R:  Safety maintained.

## 2021-11-15 NOTE — Group Note (Signed)
Recreation Therapy Group Note   Group Topic:Stress Management  Group Date: 11/15/2021 Start Time: 0930 End Time: 0950 Facilitators: Caroll Rancher, LRT,CTRS Location: [address] Dayroom   Goal Area(s) Addresses:  Patient will actively participate in stress management techniques presented during session.  Patient will successfully identify benefit of practicing stress management post d/c.   Group Description: Guided Imagery. LRT provided education, instruction, and demonstration on practice of visualization via guided imagery. Patient was asked to participate in the technique introduced during session. LRT debriefed including topics of mindfulness, stress management and specific scenarios each patient could use these techniques. Patients were given suggestions of ways to access scripts post d/c and encouraged to explore Youtube and other apps available on smartphones, tablets, and computers.   Affect/Mood: Appropriate   Participation Level: Active   Participation Quality: Independent   Behavior: Appropriate   Speech/Thought Process: Focused   Insight: Good   Judgement: Good   Modes of Intervention: Script, Nature Sounds   Patient Response to Interventions:  Engaged   Education Outcome:  Acknowledges education and In group clarification offered    Clinical Observations/Individualized Feedback: Pt attended and participated in group.    Plan: Continue to engage patient in RT group sessions 2-3x/week.   Caroll Rancher, LRT,CTRS 11/15/2021 1:08 PM

## 2021-11-15 NOTE — BHH Counselor (Signed)
Events affecting the discharge plan:  - Medication stabilization Interventions by CSW:  - CSW met with this patient to discuss discharge plans.  Emotional response of the patient/family to the plan of care: - Pt states he plans to stay at a hotel near his parents house as he does not want to return to live with parents. Pt was upset when discussing medication management and stabilization.       Darletta Moll MSW, LCSW Clincal Social Worker  [facility]

## 2021-11-16 MED ORDER — PALIPERIDONE ER 6 MG PO TB24
12.0000 mg | ORAL_TABLET | Freq: Every day | ORAL | Status: DC
  Administered 2021-11-17: 12 mg via ORAL
  Filled 2021-11-16 (×2): qty 2

## 2021-11-16 MED ORDER — PALIPERIDONE ER 3 MG PO TB24
3.0000 mg | ORAL_TABLET | Freq: Once | ORAL | Status: AC
  Administered 2021-11-16: 3 mg via ORAL
  Filled 2021-11-16 (×2): qty 1

## 2021-11-16 NOTE — Progress Notes (Addendum)
[facility] MD Progress Note  11/16/2021 2:31 PM Mario Thomas  MRN:  [id number] Subjective:   Mario Thomas is a 20 year old patient with a past psychiatric history of bipolar disorder and a [facility] when he was 20 years old.  Patient presented for this hospitalization during reported manic episode, current diagnosis this hospitalization is bipolar 1, current episode, manic.  Case was discussed in the multidisciplinary team. MAR was reviewed and patient was compliant with medications.  He did not require any PRN's for agitation. Patient did get EKG and labs. Staff noted that patient appeared to be laughing inappropriately during EKG.  On assessment this AM patient is eager to talk to the team but is less intrusive compared to other days with his desire to find providers on rounds. He reports that his visit with his mother went well. Patient reports that he slept well and his appetite is normal. Patient reports that he is not praying more than normal and does not believe he is receiving special messages or that God is dictating EVERY decision in his life. Patient initially endorsed that he still would like to go to a hotel; however; upon discussions with providers concerning safety patient endorsed he was open to going home. Patient endorses that his parents said they would try to give him a bit more freedom.Patient denies SI, HI, and AVH. He denies ideas of reference of first rank symptoms. He voices no physical complaints and denies medication side-effects.   Principal Problem: Bipolar I disorder, current or most recent episode manic, with psychotic features (HCC) Diagnosis: Principal Problem:   Bipolar I disorder, current or most recent episode manic, with psychotic features (HCC)  Collateral, mom: Mom reports that she did come for visitation. Mom reports that the patient made it clear he is mad at them about his  hospitalization, but he told mom her would forgive her. Mom reports that family realizes they may have to adjust how they communicate with Mario Thomas. Mom and Dad report that they are concluding that he may not be like his sister or friends, but will provide a supportive environment for him to be successful while also giving him more space to be an adult.  Total Time Spent in Direct Patient Care:  I personally spent 30 minutes on the unit in direct patient care. The direct patient care time included face-to-face time with the patient, reviewing the patient's chart, communicating with other professionals, talking to mother, and coordinating care. Greater than 50% of this time was spent in counseling or coordinating care with the patient regarding goals of hospitalization, psycho-education, and discharge planning needs.  Past Psychiatric History: See H&P  Past Medical History:  Past Medical History:  Diagnosis Date   Heart murmur    MDD (major depressive disorder), severe (HCC) 08/23/2018   History reviewed. No pertinent surgical history. Family History:  Family History  Problem Relation Age of Onset   Hyperlipidemia Father    Family Psychiatric  History: See H&P Social History:  Social History   Substance and Sexual Activity  Alcohol Use Never     Social History   Substance and Sexual Activity  Drug Use Never    Social History   Socioeconomic History   Marital status: Single    Spouse name: Not on file   Number of children: Not on file   Years of education: Not on file   Highest education level: Not on file  Occupational History   Not on file  Tobacco Use   Smoking status: Never   Smokeless tobacco: Never  Vaping Use   Vaping Use: Never used  Substance and Sexual Activity   Alcohol use: Never   Drug use: Never   Sexual activity: Never  Other Topics Concern   Not on file  Social History Narrative   01/23/21   From: the area   Living: with parents   Work: Sport and exercise psychologist: Actuary at [employer]: good relationship with parents, 1 sibling      Enjoys: running      Exercise: daily running    Diet: meat, veggies, grains      Safety   Seat belts: Yes    Guns: No   Safe in relationships: Yes    Social Determinants of Corporate investment banker Strain: Not on file  Food Insecurity: Not on file  Transportation Needs: Not on file  Physical Activity: Not on file  Stress: Not on file  Social Connections: Not on file     Sleep: Good  Appetite:  Good  Current Medications: Current Facility-Administered Medications  Medication Dose Route Frequency Provider Last Rate Last Admin   acetaminophen (TYLENOL) tablet 650 mg  650 mg Oral Q6H PRN Laveda Abbe, NP       alum & mag hydroxide-simeth (MAALOX/MYLANTA) 200-200-20 MG/5ML suspension 30 mL  30 mL Oral Q4H PRN Laveda Abbe, NP       benztropine (COGENTIN) tablet 1 mg  1 mg Oral QHS Laveda Abbe, NP   1 mg at 11/15/21 2117   hydrOXYzine (ATARAX) tablet 25 mg  25 mg Oral TID PRN Laveda Abbe, NP       risperiDONE (RISPERDAL M-TABS) disintegrating tablet 2 mg  2 mg Oral Q8H PRN Laveda Abbe, NP       And   LORazepam (ATIVAN) tablet 1 mg  1 mg Oral PRN Laveda Abbe, NP       And   ziprasidone (GEODON) injection 20 mg  20 mg Intramuscular PRN Laveda Abbe, NP       magnesium hydroxide (MILK OF MAGNESIA) suspension 30 mL  30 mL Oral Daily PRN Laveda Abbe, NP       Melene Muller ON 11/17/2021] paliperidone (INVEGA) 24 hr tablet 12 mg  12 mg Oral Daily Mason Jim, Areesha Dehaven E, MD       traZODone (DESYREL) tablet 50 mg  50 mg Oral QHS PRN Laveda Abbe, NP   50 mg at 11/15/21 2117    Lab Results:  Results for orders placed or performed during the hospital encounter of 11/09/21 (from the past 48 hour(s))  Lipid panel     Status: Abnormal   Collection Time: 11/15/21  6:43 PM  Result Value Ref Range   Cholesterol 131 0 -  200 mg/dL   Triglycerides 161 (H) <150 mg/dL   HDL 50 >09 mg/dL   Total CHOL/HDL Ratio 2.6 RATIO   VLDL 40 0 - 40 mg/dL   LDL Cholesterol 41 0 - 99 mg/dL    Comment:        Total Cholesterol/HDL:CHD Risk Coronary Heart Disease Risk Table                     Men   Women  1/2 Average Risk   3.4   3.3  Average Risk  5.0   4.4  2 X Average Risk   9.6   7.1  3 X Average Risk  23.4   11.0        Use the calculated Patient Ratio above and the CHD Risk Table to determine the patient's CHD Risk.        ATP III CLASSIFICATION (LDL):  <100     mg/dL   Optimal  168-372  mg/dL   Near or Above                    Optimal  130-159  mg/dL   Borderline  902-111  mg/dL   High  >552     mg/dL   Very High Performed at [facility], [address], [place], [place] [address]   CBC with Differential/Platelet     Status: Abnormal   Collection Time: 11/15/21  6:43 PM  Result Value Ref Range   WBC 7.6 4.0 - 10.5 K/uL   RBC 4.97 4.22 - 5.81 MIL/uL   Hemoglobin 14.9 13.0 - 17.0 g/dL   HCT 33.6 12.2 - 44.9 %   MCV 92.4 80.0 - 100.0 fL   MCH 30.0 26.0 - 34.0 pg   MCHC 32.5 30.0 - 36.0 g/dL   RDW 75.3 00.5 - 11.0 %   Platelets 222 150 - 400 K/uL   nRBC 0.0 0.0 - 0.2 %   Neutrophils Relative % 53 %   Neutro Abs 4.0 1.7 - 7.7 K/uL   Lymphocytes Relative 35 %   Lymphs Abs 2.7 0.7 - 4.0 K/uL   Monocytes Relative 8 %   Monocytes Absolute 0.6 0.1 - 1.0 K/uL   Eosinophils Relative 2 %   Eosinophils Absolute 0.1 0.0 - 0.5 K/uL   Basophils Relative 1 %   Basophils Absolute 0.1 0.0 - 0.1 K/uL   Immature Granulocytes 1 %   Abs Immature Granulocytes 0.09 (H) 0.00 - 0.07 K/uL    Comment: Performed at [facility], [address] [place], [place] [address]  Hemoglobin A1c     Status: None   Collection Time: 11/15/21  6:43 PM  Result Value Ref Range   Hgb A1c MFr Bld 5.0 4.8 - 5.6 %    Comment: (NOTE) Pre diabetes:          5.7%-6.4%  Diabetes:               >6.4%  Glycemic control for   <7.0% adults with diabetes    Mean Plasma Glucose 96.8 mg/dL    Comment: Performed at [facility], [address], [place], [place] [address]  TSH     Status: None   Collection Time: 11/15/21  6:43 PM  Result Value Ref Range   TSH 2.949 0.350 - 4.500 uIU/mL    Comment: Performed by a 3rd Generation assay with a functional sensitivity of <=0.01 uIU/mL. Performed at [facility], [address] [place], [place] [address]     Blood Alcohol level:  Lab Results  Component Value Date   [facility] <10 11/08/2021   ETH <10 08/07/2019    Metabolic Disorder Labs: Lab Results  Component Value Date   HGBA1C 5.0 11/15/2021   MPG 96.8 11/15/2021   MPG 97 08/24/2018   Lab Results  Component Value Date   PROLACTIN 10.0 08/24/2018   Lab Results  Component Value Date   CHOL 131 11/15/2021   TRIG 202 (H) 11/15/2021   HDL 50 11/15/2021  CHOLHDL 2.6 11/15/2021   VLDL 40 11/15/2021   LDLCALC 41 11/15/2021   LDLCALC 58 08/24/2018    Physical Findings: AIMS: Facial and Oral Movements Muscles of Facial Expression: None, normal Lips and Perioral Area: None, normal Jaw: None, normal Tongue: None, normal,Extremity Movements Upper (arms, wrists, hands, fingers): None, normal Lower (legs, knees, ankles, toes): None, normal, Trunk Movements Neck, shoulders, hips: None, normal, Overall Severity Severity of abnormal movements (highest score from questions above): None, normal Incapacitation due to abnormal movements: None, normal Patient's awareness of abnormal movements (rate only patient's report): No Awareness, Dental Status Current problems with teeth and/or dentures?: No Does patient usually wear dentures?: No    Musculoskeletal: Strength & Muscle Tone: within normal limits Gait & Station: normal Patient leans: N/A  Psychiatric Specialty Exam:  Presentation  General Appearance: casually dressed, adequate hygiene  Eye  Contact:Good  Speech:Clear and Coherent  Speech Volume:Normal  Handedness:Right   Mood and Affect  Mood:described as "good" - appears more euthymic and less aloof  Affect:Restricted   Thought Process  Thought Processes:Goal directed and less ruminative about discharge planning; linear  Orientation:Full (Time, Place and Person)  Thought Content:Denies AVH, paranoia, ideas of reference, or first rank symptoms; does not make hyper-religous statements on exam and is less ruminative about discharge planning; is able to reality test some today when discussing potential discharge home with family and fact that family is not conspiring against him  Hallucinations:Hallucinations: None  Ideas of Reference:Denied  Suicidal Thoughts:Suicidal Thoughts: No  Homicidal Thoughts:Homicidal Thoughts: No   Sensorium  Memory:Immediate Good; Recent Fair  Judgment:-- (improving)  Insight:Shallow   Executive Functions  Concentration:Fair  Attention Span:Fair  Recall:Good  Fund of Knowledge:Good  Language:Good   Psychomotor Activity  Psychomotor Activity:Psychomotor Activity: Normal   Assets  Assets:Social Support; Resilience   Sleep  Time not recorded  Physical Exam HENT:     Head: Normocephalic and atraumatic.  Pulmonary:     Effort: Pulmonary effort is normal.  Neurological:     General: No focal deficit present.     Mental Status: He is alert.   Review of Systems  Respiratory:  Negative for shortness of breath.   Cardiovascular:  Negative for chest pain.  Gastrointestinal:  Negative for diarrhea, nausea and vomiting.  Psychiatric/Behavioral:  Negative for hallucinations and suicidal ideas.   Blood pressure 131/75, pulse (!) 104, temperature 97.9 F (36.6 C), temperature source Oral, resp. rate 18, height 6' (1.829 m), weight 68 kg, SpO2 97 %. Body mass index is 20.34 kg/m.   Treatment Plan Summary: Daily contact with patient to assess and evaluate symptoms  and progress in treatment and Medication management Patient appears to slowly be improving and has become less guarded and less intrusive. Patient did get his labs without needing supervision and had a productive conversation with his mom. Patient appears to be struggling with feeling independent and this may be playing into patient's reluctance to follow advice, but suddenly changing his mind to agree. Patient appears more logical and is able to reality test better today when confronted with discussion about returning home.  Bipolar 1 disorder, current episode, manic with psychotic features (R/O schizoaffective disorder, bipolar type) - Increase paliperidone 12 mg and continue to monitor based on report yesterday from mother that he is nearing but not at clinical baseline - TSH :WNL, HgbA1c- WNL, CBC- WNL, Lipid panel- WNL w/ exception TGs- 202 - Repeat EKG - Qtc 426 while on Invega - Continue home Cogentin 1 mg nightly -Agitation  protocol: Risperdal 2 mg q.  8 H as needed w/ Ativan 1 mg as needed w/ Geodon 20 mg IM    Leukopenia- resolved WBC today 7.6 with ANC 4000   PRN -Tylenol 650mg  q6h, pain -Maalox 30ml q4h, indigestion -Atarax 25mg  TID, anxiety -Milk of Mag 44mL, constipation -Trazodone 50mg  QHS, insomnia   PGY-2 , MD 11/16/2021, 2:31 PM

## 2021-11-16 NOTE — Progress Notes (Signed)
Mario Thomas remains psychotic but pleasant. Minimal interaction with peers.  He was noted pacing the floors laughing to himself.  He was preoccupied with getting his EKG which was completed and placed on the front of the chart.  It was difficulty to obtained due to him laughing to him self and moving around.  He SI/HI.  He is clearly responding to internal stimuli.  Q 15 minute checks maintained for safety.   11/15/21 2117  Psych Admission Type (Psych Patients Only)  Admission Status Involuntary  Psychosocial Assessment  Patient Complaints Insomnia  Eye Contact Brief;Avertive  Facial Expression Blank;Flat  Affect Appropriate to circumstance  Speech Logical/coherent  Interaction Assertive;Childlike;Needy  Motor Activity Pacing  Appearance/Hygiene Unremarkable  Behavior Characteristics Cooperative;Pacing  Mood Preoccupied;Pleasant  Thought Process  Coherency Concrete thinking;Blocking  Content Religiosity;Preoccupation  Delusions Religious  Perception WDL  Hallucination None reported or observed  Judgment Poor  Confusion None  Danger to Self  Current suicidal ideation? Denies  Danger to Others  Danger to Others None reported or observed

## 2021-11-16 NOTE — Progress Notes (Addendum)
   11/16/21 0807  Psych Admission Type (Psych Patients Only)  Admission Status Involuntary  Psychosocial Assessment  Patient Complaints None  Eye Contact Brief;Avertive  Facial Expression Fixed smile  Affect Appropriate to circumstance  Speech Logical/coherent  Interaction Assertive  Motor Activity Pacing  Appearance/Hygiene Unremarkable  Behavior Characteristics Cooperative  Mood Pleasant  Thought Process  Coherency Concrete thinking  Content WDL  Delusions Religious  Perception WDL  Hallucination None reported or observed  Judgment Poor  Confusion None  Danger to Self  Current suicidal ideation? Denies  Danger to Others  Danger to Others None reported or observed   D: Patient denies SI/HI/AVH. Patient denies anxiety and depression. Pt. Out in open areas and has been social with peers.  A:  Patient took scheduled medicine.  Support and encouragement provided Routine safety checks conducted every 15 minutes. Patient  Informed to notify staff with any concerns.   R:  Safety maintained.

## 2021-11-16 NOTE — BHH Group Notes (Signed)
Patient did not attend the relaxation group. 

## 2021-11-17 DIAGNOSIS — F312 Bipolar disorder, current episode manic severe with psychotic features: Principal | ICD-10-CM

## 2021-11-17 MED ORDER — PALIPERIDONE ER 6 MG PO TB24: 12.0000 mg | ORAL_TABLET | Freq: Every day | ORAL | 0 refills | Status: DC

## 2021-11-17 MED ORDER — BENZTROPINE MESYLATE 1 MG PO TABS: 1.0000 mg | ORAL_TABLET | Freq: Every day | ORAL | 2 refills | Status: DC

## 2021-11-17 MED ORDER — TRAZODONE HCL 50 MG PO TABS: 50.0000 mg | ORAL_TABLET | Freq: Every evening | ORAL | 0 refills | Status: DC | PRN
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
## 2021-11-17 NOTE — Group Note (Signed)
LCSW Group Therapy Note   Group Date: 11/17/2021 Start Time: 1300 End Time: 1400  Topic: Coping Skills  Due to the acuity and a high number of new admissions, group was not held. Patient was provided therapeutic worksheets and asked to meet with CSW as needed.   Felizardo Hoffmann, LCSWA 11/17/2021  12:56 PM

## 2021-11-17 NOTE — Progress Notes (Signed)
Pt in a stable mood. Pt very pleasant. Pt denies SI/HI. Denies AVH. Denies and anxiety or depression. Pt states he does have some insomnia and the PRN medication given for complaint is very effective. Pt interacted positively with staff and peers during shift.     11/16/21 2100  Psych Admission Type (Psych Patients Only)  Admission Status Involuntary  Psychosocial Assessment  Patient Complaints None  Eye Contact Avertive  Facial Expression Fixed smile  Affect Appropriate to circumstance  Speech Logical/coherent  Interaction Assertive  Motor Activity Fidgety  Appearance/Hygiene Unremarkable  Behavior Characteristics Cooperative;Appropriate to situation  Mood Pleasant  Thought Process  Coherency WDL  Content WDL  Delusions WDL  Perception WDL  Hallucination None reported or observed  Judgment Limited  Confusion None  Danger to Self  Current suicidal ideation? Denies  Danger to Others  Danger to Others None reported or observed

## 2021-11-17 NOTE — Discharge Summary (Addendum)
Physician Discharge Summary Note  Patient:  Mario Thomas is an 20 y.o., male MRN:  [id number] DOB:  08/17/2001 Patient phone:  [phone] (home)  Patient address:   [address] [name] [facility] [address],  Total Time spent with patient: 15 minutes  Date of Admission:  11/09/2021 Date of Discharge: 11/17/2021  Reason for Admission:  Manic behavior: spending large amounts of money staying in an expensive hotel rather than at his home, going back and forth between home and hotel, bizarre to family, not on medication  Principal Problem: Bipolar I disorder, current or most recent episode manic, with psychotic features [facility]) Discharge Diagnoses: Principal Problem:   Bipolar I disorder, current or most recent episode manic, with psychotic features [facility])   Past Psychiatric History: Bipolar diagnosis at age 67 in 70 at [facility] C/a unit.  Treated with Abilify 10 mg daily Outpatient Psychiatrist: [facility] since 2019 Per mom dx w/ ADHD as a child in [place]  Past Medical History:  Past Medical History:  Diagnosis Date   Heart murmur    MDD (major depressive disorder), severe (HCC) 08/23/2018   History reviewed. No pertinent surgical history. Family History:  Family History  Problem Relation Age of Onset   Hyperlipidemia Father    Family Psychiatric  History: none known Social History:  Social History   Substance and Sexual Activity  Alcohol Use Never     Social History   Substance and Sexual Activity  Drug Use Never    Social History   Socioeconomic History   Marital status: Single    Spouse name: Not on file   Number of children: Not on file   Years of education: Not on file   Highest education level: Not on file  Occupational History   Not on file  Tobacco Use   Smoking status: Never   Smokeless tobacco: Never  Vaping Use   Vaping Use: Never used  Substance and Sexual Activity   Alcohol use: Never   Drug use: Never   Sexual activity: Never   Other Topics Concern   Not on file  Social History Narrative   01/23/21   From: the area   Living: with parents   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, 1 sibling      Enjoys: running      Exercise: daily running    Diet: meat, veggies, grains      Safety   Seat belts: Yes    Guns: No   Safe in relationships: Yes    Social Determinants of Corporate investment banker Strain: Not on file  Food Insecurity: Not on file  Transportation Needs: Not on file  Physical Activity: Not on file  Stress: Not on file  Social Connections: Not on file    Hospital Course:  Mario Thomas was a 20 yo patient who presented with IVC that was continued upon admission. Patient endorsed bizarre behavior, poor insight, and poor judgement. Patient was initially restarted on his previous home medication of Abilify and was titrated up. However, upon receiving collateral there was concern that this medication had actually been a failure in the past when he was compliant. With upward titration patient did not improve. Patient was transitioned to [facility] with the hope of LAI to assist with compliance. Patient was compliant in the hospital taking his medications. During hospitalization patient was down graded from high acuity hall to medium, due to  another patient being very intrusive towards Mario Thomas. Mario Thomas handled the situation appropriately with staff. Initially Mario Thomas was noted to not interact much with the new more open patient population. Patient was noted to spend a lot of time on the phone talking to his parents and hyperreligious. Patient hyperreigliousity was also seen on exam. Patient displayed poor logic and became intrusive with staff. Patient never required PRNs for behavior and could be easily redirected with his intrusiveness. Patient was also noted to have a delusion that his parents were not safe and did not want him to return and appeared to have  some thought blocking associated. As Invega was titrated upwards, thought blocking and delusions resolved and patient's insight and judgement improved. Patient was noted to attend groups, be involved in his care and group therapy and less intrusive as his admission progressed. At discharge patient was willing to return home and was denying SI, HI, and AVH. Throughout patient hospitalization, family was supportive and encouraging. Patient's family and patient had safety plan discussed with them and both endorsed understanding.   Physical Findings: AIMS: Facial and Oral Movements Muscles of Facial Expression: None, normal Lips and Perioral Area: None, normal Jaw: None, normal Tongue: None, normal,Extremity Movements Upper (arms, wrists, hands, fingers): None, normal Lower (legs, knees, ankles, toes): None, normal, Trunk Movements Neck, shoulders, hips: None, normal, Overall Severity Severity of abnormal movements (highest score from questions above): None, normal Incapacitation due to abnormal movements: None, normal Patient's awareness of abnormal movements (rate only patient's report): No Awareness, Dental Status Current problems with teeth and/or dentures?: No Does patient usually wear dentures?: No  CIWA:    COWS:     Musculoskeletal: Strength & Muscle Tone: within normal limits Gait & Station: normal Patient leans: N/A   Psychiatric Specialty Exam:  Presentation  General Appearance: Appropriate for Environment  Eye Contact:Good  Speech:Clear and Coherent  Speech Volume:Normal  Handedness:Right   Mood and Affect  Mood:Euthymic  Affect:Appropriate   Thought Process  Thought Processes:Coherent  Descriptions of Associations:Intact  Orientation:Full (Time, Place and Person)  Thought Content:Logical  History of Schizophrenia/Schizoaffective disorder:No  Duration of Psychotic Symptoms:N/A  Hallucinations:Hallucinations: None Ideas of Reference:None  Suicidal  Thoughts:Suicidal Thoughts: No Homicidal Thoughts:Homicidal Thoughts: No  Sensorium  Memory:Immediate Good; Recent Good; Remote Good  Judgment:Fair  Insight:Present   Executive Functions  Concentration:Good  Attention Span:Good  Recall:Good  Fund of Knowledge:Good  Language:Good   Psychomotor Activity  Psychomotor Activity:Psychomotor Activity: Normal  Assets  Assets:Communication Skills; Housing; Research scientist (medical); Resilience   Sleep  Sleep:Sleep: Good   Physical Exam: Physical Exam Constitutional:      Appearance: Normal appearance.  HENT:     Head: Normocephalic and atraumatic.  Eyes:     Extraocular Movements: Extraocular movements intact.  Pulmonary:     Effort: Pulmonary effort is normal.  Neurological:     Mental Status: He is alert and oriented to person, place, and time.   ROS Blood pressure 125/80, pulse (!) 106, temperature 98.4 F (36.9 C), temperature source Oral, resp. rate 18, height 6' (1.829 m), weight 68 kg, SpO2 100 %. Body mass index is 20.34 kg/m.   Social History   Tobacco Use  Smoking Status Never  Smokeless Tobacco Never   Tobacco Cessation:  N/A, patient does not currently use tobacco products   Blood Alcohol level:  Lab Results  Component Value Date   [facility] <10 11/08/2021   ETH <10 08/07/2019    Metabolic Disorder Labs:  Lab Results  Component Value Date  HGBA1C 5.0 11/15/2021   MPG 96.8 11/15/2021   MPG 97 08/24/2018   Lab Results  Component Value Date   PROLACTIN 10.0 08/24/2018   Lab Results  Component Value Date   CHOL 131 11/15/2021   TRIG 202 (H) 11/15/2021   HDL 50 11/15/2021   CHOLHDL 2.6 11/15/2021   VLDL 40 11/15/2021   LDLCALC 41 11/15/2021   LDLCALC 58 08/24/2018    See Psychiatric Specialty Exam and Suicide Risk Assessment completed by Attending Physician prior to discharge.  Discharge destination:  Home  Is patient on multiple antipsychotic therapies at discharge:  No   Has Patient had  three or more failed trials of antipsychotic monotherapy by history:  No  Recommended Plan for Multiple Antipsychotic Therapies: NA   Allergies as of 11/17/2021   No Known Allergies      Medication List     STOP taking these medications    ARIPiprazole 10 MG tablet Commonly known as: ABILIFY       TAKE these medications      Indication  benztropine 1 MG tablet Commonly known as: COGENTIN Take 1 tablet (1 mg total) by mouth at bedtime.  Indication: Extrapyramidal Reaction caused by Medications   paliperidone 6 MG 24 hr tablet Commonly known as: INVEGA Take 2 tablets (12 mg total) by mouth daily. Start taking on: November 18, 2021  Indication: Bipolar disorder Type 1 w/ psychotic features   traZODone 50 MG tablet Commonly known as: DESYREL Take 1 tablet (50 mg total) by mouth at bedtime as needed for sleep.  Indication: Trouble Sleeping        Follow-up Information     [facility], [facility]. Go on 11/20/2021.   Why: You have a hospital follow up appointment for therapy and medication management services on 11/20/21 at 8:30 am.  The first appointment must be held in person. Contact information: [address] [place] [address] [phone]                 Follow-up recommendations:  Follow up recommendations: - Activity as tolerated. - Diet as recommended by PCP. - Keep all scheduled follow-up appointments as recommended.   Comments:  Patient is instructed to take all prescribed medications as recommended. Report any side effects or adverse reactions to your outpatient psychiatrist. Patient is instructed to abstain from alcohol and illegal drugs while on prescription medications. In the event of worsening symptoms, patient is instructed to call the crisis hotline, 911, or go to the nearest emergency department for evaluation and treatment.    Signed:  PGY-2 Bobbye Morton, MD 11/17/2021, 4:47 PM

## 2021-11-17 NOTE — Progress Notes (Signed)
  [facility] Adult Case Management Discharge Plan :  Will you be returning to the same living situation after discharge:  No. Will be staying with parents At discharge, do you have transportation home?: Yes,  parents to pick this patient up Do you have the ability to pay for your medications: Yes,  has insurance  Release of information consent forms completed and in the chart;  Patient's signature needed at discharge.  Patient to Follow up at:  Follow-up Information     [facility], [facility]. Go on 11/20/2021.   Why: You have a hospital follow up appointment for therapy and medication management services on 11/20/21 at 8:30 am.  The first appointment must be held in person. Contact information: [address] [place] [address] [phone]                 Next level of care provider has access to [facility] Link:no  Safety Planning and Suicide Prevention discussed: Yes,  with patient     Has patient been referred to the Quitline?: N/A patient is not a smoker  Patient has been referred for addiction treatment: Pt. refused referral  Otelia Santee, LCSW 11/17/2021, 11:09 AM

## 2021-11-17 NOTE — Progress Notes (Signed)
Patient discharged to current residency. Denying thoughts of self harm. Denying AVH. Verbalized understanding of discharge instructions.

## 2021-11-17 NOTE — Plan of Care (Signed)
Expressing readiness for discharge. Calm and cooperative and knowledgeable of his mental illness, willing to manage. Denies SI/HI/AVH. Pt shared with Clinical research associate that his other family members suffer from mental health illness but do not receive treatment. His main goal is to transfer to [facility] [employer] to continue his education.

## 2021-11-17 NOTE — Group Note (Signed)
Recreation Therapy Group Note   Group Topic:Stress Management  Group Date: 11/17/2021 Start Time: 0930 End Time: 0949 Facilitators: Caroll Rancher, [place] Location: [address] Dayroom   Goal Area(s) Addresses:  Patient will identify positive stress management techniques. Patient will identify benefits of using stress management post d/c.  Group Description:  Meditation.  LRT played a meditation that focused on setting boundaries with the people around you.  Meditation emphasized the point of saying no to things and the wishes of others is ok in order to put your feelings and desires first.     Affect/Mood: Appropriate   Participation Level: Active   Participation Quality: Independent   Behavior: Appropriate   Speech/Thought Process: Focused   Insight: Good   Judgement: Good   Modes of Intervention: Meditation   Patient Response to Interventions:  Engaged   Education Outcome:  Acknowledges education and In group clarification offered    Clinical Observations/Individualized Feedback: Pt attended and participated in group.    Plan: Continue to engage patient in RT group sessions 2-3x/week.   Caroll Rancher, LRT,CTRS 11/17/2021 11:13 AM

## 2021-11-20 NOTE — BHH Group Notes (Signed)
   Spiritual care group on grief and loss facilitated by chaplain Dyanne Carrel, [facility]   Group Goal:   Support / Education around grief and loss   Members engage in facilitated group support and psycho-social education.   Group Description:   Following introductions and group rules, group members engaged in facilitated group dialog and support around topic of loss, with particular support around experiences of loss in their lives. Group Identified types of loss (relationships / self / things) and identified patterns, circumstances, and changes that precipitate losses. Reflected on thoughts / feelings around loss, normalized grief responses, and recognized variety in grief experience. Group noted Worden's four tasks of grief in discussion.   Group drew on Adlerian / Rogerian, narrative, MI,   Patient Progress: At the beginning Mario Thomas left group stating that he did not need to talk about feelings or coping skills, he needed to talk about Jesus.  He returned after about 5 min  and participated and engaged in conversation.  [address], [place] Pager, [phone]

## 2021-11-20 NOTE — BHH Group Notes (Signed)
Spirituality group facilitated by Kathrynn Humble, [place].   Group Description: Group focused on topic of hope. Patients participated in facilitated discussion around topic, connecting with one another around experiences and definitions for hope. Group members engaged with visual explorer photos, reflecting on what hope looks like for them today. Group engaged in discussion around how their definitions of hope are present today in hospital.   Modalities: Psycho-social ed, Adlerian, Narrative, MI   Patient Progress: Mario Thomas participated in group and was very focused on his parents' actions and forgiving them.  We also met individually, at his request, and talked through some of his feelings around his parents.  [place], [place] Pager, [phone] 9:19 PM

## 2022-04-03 ENCOUNTER — Encounter: Payer: Self-pay | Admitting: Family Medicine

## 2022-05-28 ENCOUNTER — Ambulatory Visit (HOSPITAL_COMMUNITY)
Admission: EM | Admit: 2022-05-28 | Discharge: 2022-05-29 | Disposition: A | Discharge status: Home / Self Care | Payer: 59 | Attending: Psychiatry | Admitting: Psychiatry

## 2022-05-28 DIAGNOSIS — Z20822 Contact with and (suspected) exposure to covid-19: Secondary | ICD-10-CM | POA: Diagnosis not present

## 2022-05-28 DIAGNOSIS — F411 Generalized anxiety disorder: Secondary | ICD-10-CM | POA: Diagnosis not present

## 2022-05-28 DIAGNOSIS — R45851 Suicidal ideations: Secondary | ICD-10-CM

## 2022-05-28 DIAGNOSIS — Z91148 Patient's other noncompliance with medication regimen for other reason: Secondary | ICD-10-CM | POA: Insufficient documentation

## 2022-05-28 DIAGNOSIS — F319 Bipolar disorder, unspecified: Secondary | ICD-10-CM | POA: Insufficient documentation

## 2022-05-28 LAB — COMPREHENSIVE METABOLIC PANEL
ALT: 21 U/L (ref 0–44)
AST: 21 U/L (ref 15–41)
Albumin: 4.2 g/dL (ref 3.5–5.0)
Alkaline Phosphatase: 79 U/L (ref 38–126)
Anion gap: 6 (ref 5–15)
BUN: 13 mg/dL (ref 6–20)
CO2: 28 mmol/L (ref 22–32)
Calcium: 9.5 mg/dL (ref 8.9–10.3)
Chloride: 105 mmol/L (ref 98–111)
Creatinine, Ser: 0.83 mg/dL (ref 0.61–1.24)
GFR, Estimated: >60 mL/min (ref >60)
Glucose, Bld: 84 mg/dL (ref 70–99)
Potassium: 4.1 mmol/L (ref 3.5–5.1)
Sodium: 139 mmol/L (ref 135–145)
Total Bilirubin: 0.5 mg/dL (ref 0.3–1.2)
Total Protein: 6.9 g/dL (ref 6.5–8.1)

## 2022-05-28 LAB — HEMOGLOBIN A1C
Hgb A1c MFr Bld: 4.9 % (ref 4.8–5.6)
Mean Plasma Glucose: 93.93 mg/dL

## 2022-05-28 LAB — LIPID PANEL
Cholesterol: 136 mg/dL (ref 0–200)
HDL: 46 mg/dL (ref >40)
LDL Cholesterol: 41 mg/dL (ref 0–99)
Total CHOL/HDL Ratio: 3 RATIO
Triglycerides: 246 mg/dL — ABNORMAL HIGH (ref <150)
VLDL: 49 mg/dL — ABNORMAL HIGH (ref 0–40)

## 2022-05-28 LAB — POCT URINE DRUG SCREEN - MANUAL ENTRY (I-SCREEN)
POC Amphetamine UR: NOT DETECTED
POC Buprenorphine (BUP): NOT DETECTED
POC Cocaine UR: NOT DETECTED
POC Marijuana UR: NOT DETECTED
POC Methadone UR: NOT DETECTED
POC Methamphetamine UR: NOT DETECTED
POC Morphine: NOT DETECTED
POC Oxazepam (BZO): NOT DETECTED
POC Oxycodone UR: NOT DETECTED
POC Secobarbital (BAR): NOT DETECTED

## 2022-05-28 LAB — CBC WITH DIFFERENTIAL/PLATELET
Abs Immature Granulocytes: 0.03 10*3/uL (ref 0.00–0.07)
Basophils Absolute: 0.1 10*3/uL (ref 0.0–0.1)
Basophils Relative: 1 %
Eosinophils Absolute: 0.2 10*3/uL (ref 0.0–0.5)
Eosinophils Relative: 4 %
HCT: 46.8 % (ref 39.0–52.0)
Hemoglobin: 15.8 g/dL (ref 13.0–17.0)
Immature Granulocytes: 1 %
Lymphocytes Relative: 32 %
Lymphs Abs: 2 10*3/uL (ref 0.7–4.0)
MCH: 30.6 pg (ref 26.0–34.0)
MCHC: 33.8 g/dL (ref 30.0–36.0)
MCV: 90.5 fL (ref 80.0–100.0)
Monocytes Absolute: 0.5 10*3/uL (ref 0.1–1.0)
Monocytes Relative: 8 %
Neutro Abs: 3.4 10*3/uL (ref 1.7–7.7)
Neutrophils Relative %: 54 %
Platelets: 185 10*3/uL (ref 150–400)
RBC: 5.17 MIL/uL (ref 4.22–5.81)
RDW: 12.3 % (ref 11.5–15.5)
WBC: 6.2 10*3/uL (ref 4.0–10.5)
nRBC: 0 % (ref 0.0–0.2)

## 2022-05-28 LAB — RESP PANEL BY RT-PCR (FLU A&B, COVID) ARPGX2
Influenza A by PCR: NEGATIVE
Influenza B by PCR: NEGATIVE
SARS Coronavirus 2 by RT PCR: NEGATIVE

## 2022-05-28 LAB — POC SARS CORONAVIRUS 2 AG: SARSCOV2ONAVIRUS 2 AG: NEGATIVE

## 2022-05-28 MED ORDER — ALUM & MAG HYDROXIDE-SIMETH 200-200-20 MG/5ML PO SUSP: 30.0000 mL | ORAL | Status: DC | PRN

## 2022-05-28 MED ORDER — HYDROXYZINE HCL 25 MG PO TABS
25.0000 mg | ORAL_TABLET | Freq: Three times a day (TID) | ORAL | Status: DC | PRN
Start: 2022-05-28 — End: 2022-05-29
  Administered 2022-05-28: 25 mg via ORAL
  Filled 2022-05-28: qty 1

## 2022-05-28 MED ORDER — MAGNESIUM HYDROXIDE 400 MG/5ML PO SUSP: 30.0000 mL | Freq: Every day | ORAL | Status: DC | PRN

## 2022-05-28 MED ORDER — ACETAMINOPHEN 325 MG PO TABS: 650.0000 mg | ORAL_TABLET | Freq: Four times a day (QID) | ORAL | Status: DC | PRN

## 2022-05-28 NOTE — ED Provider Notes (Signed)
[facility] Continuous Assessment Admission H&P  Date: 05/29/22 Patient Name: Mario Thomas MRN: [id number] Chief Complaint:  Chief Complaint  Patient presents with   Suicidal      Diagnoses:  Final diagnoses:  Suicidal ideation  Noncompliance with medication regimen  Generalized anxiety disorder    HPI: Mario Thomas,  21 y.o male, with a history of bipolar disorder diagnosed at age 29, ADHD diagnosed when he was a child in [place], major depressive disorder, anxiety, presented to [facility] with his father, for behavioral issues.  Patient also endorsed suicidal ideation with no immediate plans, according to patient he was suicidal today he also stated it has been going on for a while.  When asked what is his plan patient laugh could not state what plan he had.  It is very hard to figure out if patient is serious, because patient would smile inappropriately when talking.  According to patient he had an aunt who passed away from suicide when he was living in [place].  Patient's father did allude to the fact that it did happen.  According to patient he has not been taking his medicine for the past year when asked why he is not taking his medicine patient stated stated he is better. Patient denies having a current psychiatrist or seeing a therapist.  According to patient he was taking Abilify and some other medicines, but could not verify what they were.  Face-to-face observation of patient, he is alert and oriented x4, speech is clear, maintain good eye contact.  Mood is anxious, with.  Just off inappropriate laughing.  Affect congruent with mood.  At first patient denied SI but after talking to him he said he was suicidal when asked what was his plan he stated he does not know.  Per the patient  "I go through things, I cannot focus".  It is difficult at times to understand what patient is saying because he keeps going around in circles and sometimes what he is saying is not coherent.    Throughout the  whole interview patient would make inappropriate laughs.  Patient denies HI, endorse hallucination but could not specifically state what it was.  Patient lives at home with his father, and his mother, and other relatives.  Per the patient his mother is currently in [place] attending a funeral.  According to patient he does have a job.  Patient denies access to guns or any other weapons.   Recommend inpatient OBSERVATION  PHQ 2-9:   Flowsheet Row ED from 05/28/2022 in [facility] Ad[facility] (Discharged) [facility] from 11/08/2021 in [facility]  C-SSRS RISK CATEGORY Error: Question 2 not populated No Risk No Risk        Total Time spent with patient: 30 minutes  Musculoskeletal  Strength & Muscle Tone: within normal limits Gait & Station: normal Patient leans: N/A  Psychiatric Specialty Exam  Presentation General Appearance: Casual  Eye Contact:Fair  Speech:Clear and Coherent  Speech Volume:Normal  Handedness:Ambidextrous   Mood and Affect  Mood:Anxious  Affect:Non-Congruent; Inappropriate   Thought Process  Thought Processes:Linear  Descriptions of Associations:Loose  Orientation:Full (Time, Place and Person)  Thought Content:Illogical  Diagnosis of Schizophrenia or Schizoaffective disorder in past: No   Hallucinations:Hallucinations: None  Ideas of Reference:None  Suicidal Thoughts:Suicidal Thoughts: Yes, Passive SI Passive Intent and/or Plan: Without Intent; Without Plan  Homicidal Thoughts:Homicidal Thoughts: No   Sensorium  Memory:Immediate Fair  Judgment:Poor  Insight:Fair   Executive Functions  Concentration:Fair  Attention Span:Fair  [place]   Psychomotor Activity  Psychomotor Activity:Psychomotor Activity: Normal   Assets  Assets:Desire for Improvement   Sleep   Sleep:Sleep: Fair   Nutritional Assessment (For OBS and FBC admissions only) Has the patient had a weight loss or gain of 10 pounds or more in the last 3 months?: No Has the patient had a decrease in food intake/or appetite?: No Does the patient have dental problems?: No Does the patient have eating habits or behaviors that may be indicators of an eating disorder including binging or inducing vomiting?: No Has the patient recently lost weight without trying?: 0    Physical Exam HENT:     Head: Normocephalic.     Nose: Nose normal.  Cardiovascular:     Rate and Rhythm: Normal rate.  Pulmonary:     Effort: Pulmonary effort is normal.  Musculoskeletal:        General: Normal range of motion.     Cervical back: Normal range of motion.  Skin:    General: Skin is warm.  Neurological:     General: No focal deficit present.     Mental Status: He is alert.  Psychiatric:        Mood and Affect: Mood normal.        Behavior: Behavior normal.        Thought Content: Thought content normal.        Judgment: Judgment normal.    Review of Systems  Constitutional: Negative.   HENT: Negative.    Eyes: Negative.   Respiratory: Negative.    Cardiovascular: Negative.   Gastrointestinal: Negative.   Genitourinary: Negative.   Musculoskeletal: Negative.   Skin: Negative.   Neurological: Negative.   Endo/Heme/Allergies: Negative.   Psychiatric/Behavioral:  Positive for depression, hallucinations and suicidal ideas.     Blood pressure (!) 144/82, pulse 77, temperature 98.3 F (36.8 C), temperature source Oral, resp. rate 17, SpO2 98 %. There is no height or weight on file to calculate BMI.  Past Psychiatric History: Anxiety, depression, bipolar disorder  Is the patient at risk to self? Yes  Has the patient been a risk to self in the past 6 months? Yes .    Has the patient been a risk to self within the distant past? Yes   Is the patient a risk to others? No   Has the patient been  a risk to others in the past 6 months? No   Has the patient been a risk to others within the distant past? No   Past Medical History:  Past Medical History:  Diagnosis Date   Heart murmur    MDD (major depressive disorder), severe ([place]) 08/23/2018   No past surgical history on file.  Family History:  Family History  Problem Relation Age of Onset   Hyperlipidemia Father     Social History:  Social History   Socioeconomic History   Marital status: Single    Spouse name: Not on file   Number of children: Not on file   Years of education: Not on file   Highest education level: Not on file  Occupational History   Not on file  Tobacco Use   Smoking status: Never   Smokeless tobacco: Never  Vaping Use   Vaping Use: Never used  Substance and Sexual Activity   Alcohol use: Never   Drug use: Never   Sexual activity: Never  Other  Topics Concern   Not on file  Social History Narrative   01/23/21   From: the area   Living: with parents   Work: Primary school teacher: Estate manager/land agent at [employer]: good relationship with parents, 1 sibling      Enjoys: running      Exercise: daily running    Diet: meat, veggies, grains      Safety   Seat belts: Yes    Guns: No   Safe in relationships: Yes    Social Determinants of Radio broadcast assistant Strain: Not on file  Food Insecurity: Not on file  Transportation Needs: Not on file  Physical Activity: Not on file  Stress: Not on file  Social Connections: Not on file  Intimate Partner Violence: Not on file    SDOH:  SDOH Screenings   Alcohol Screen: Low Risk  (11/09/2021)   Alcohol Screen    Last Alcohol Screening Score (AUDIT): 0  Depression (PHQ2-9): Not on file  Financial Resource Strain: Not on file  Food Insecurity: Not on file  Housing: Not on file  Physical Activity: Not on file  Social Connections: Not on file  Stress: Not on file  Tobacco Use: Low Risk  (11/09/2021)   Patient History     Smoking Tobacco Use: Never    Smokeless Tobacco Use: Never    Passive Exposure: Not on file  Transportation Needs: Not on file    Last Labs:  Admission on 05/28/2022, Discharged on 05/29/2022  Component Date Value Ref Range Status   SARS Coronavirus 2 by RT PCR 05/28/2022 NEGATIVE  NEGATIVE Final   Comment: (NOTE) SARS-CoV-2 target nucleic acids are NOT DETECTED.  The SARS-CoV-2 RNA is generally detectable in upper respiratory specimens during the acute phase of infection. The lowest concentration of SARS-CoV-2 viral copies this assay can detect is 138 copies/mL. A negative result does not preclude SARS-Cov-2 infection and should not be used as the sole basis for treatment or other patient management decisions. A negative result may occur with  improper specimen collection/handling, submission of specimen other than nasopharyngeal swab, presence of viral mutation(s) within the areas targeted by this assay, and inadequate number of viral copies(<138 copies/mL). A negative result must be combined with clinical observations, patient history, and epidemiological information. The expected result is Negative.  Fact Sheet for Patients:  EntrepreneurPulse.com.au  Fact Sheet for Healthcare Providers:  IncredibleEmployment.be  This test is no                          t yet approved or cleared by the [place] FDA and  has been authorized for detection and/or diagnosis of SARS-CoV-2 by FDA under an Emergency Use Authorization (EUA). This EUA will remain  in effect (meaning this test can be used) for the duration of the COVID-19 declaration under Section 564(b)(1) of the Act, 21 U.S.C.section 360bbb-3(b)(1), unless the authorization is terminated  or revoked sooner.       Influenza A by PCR 05/28/2022 NEGATIVE  NEGATIVE Final   Influenza B by PCR 05/28/2022 NEGATIVE  NEGATIVE Final   Comment: (NOTE) The Xpert Xpress SARS-CoV-2/FLU/RSV plus  assay is intended as an aid in the diagnosis of influenza from Nasopharyngeal swab specimens and should not be used as a sole basis for treatment. Nasal washings and aspirates are unacceptable for Xpert Xpress SARS-CoV-2/FLU/RSV testing.  Fact Sheet for Patients: EntrepreneurPulse.com.au  Fact Sheet for Healthcare  Providers: IncredibleEmployment.be  This test is not yet approved or cleared by the [place] and has been authorized for detection and/or diagnosis of SARS-CoV-2 by FDA under an Emergency Use Authorization (EUA). This EUA will remain in effect (meaning this test can be used) for the duration of the COVID-19 declaration under Section 564(b)(1) of the Act, 21 U.S.C. section 360bbb-3(b)(1), unless the authorization is terminated or revoked.  Performed at [facility], [place] [address] [place], [place] [address]    WBC 05/28/2022 6.2  4.0 - 10.5 K/uL Final   RBC 05/28/2022 5.17  4.22 - 5.81 MIL/uL Final   Hemoglobin 05/28/2022 15.8  13.0 - 17.0 g/dL Final   HCT 05/28/2022 46.8  39.0 - 52.0 % Final   MCV 05/28/2022 90.5  80.0 - 100.0 fL Final   MCH 05/28/2022 30.6  26.0 - 34.0 pg Final   MCHC 05/28/2022 33.8  30.0 - 36.0 g/dL Final   RDW 05/28/2022 12.3  11.5 - 15.5 % Final   Platelets 05/28/2022 185  150 - 400 K/uL Final   nRBC 05/28/2022 0.0  0.0 - 0.2 % Final   Neutrophils Relative % 05/28/2022 54  % Final   Neutro Abs 05/28/2022 3.4  1.7 - 7.7 K/uL Final   Lymphocytes Relative 05/28/2022 32  % Final   Lymphs Abs 05/28/2022 2.0  0.7 - 4.0 K/uL Final   Monocytes Relative 05/28/2022 8  % Final   Monocytes Absolute 05/28/2022 0.5  0.1 - 1.0 K/uL Final   Eosinophils Relative 05/28/2022 4  % Final   Eosinophils Absolute 05/28/2022 0.2  0.0 - 0.5 K/uL Final   Basophils Relative 05/28/2022 1  % Final   Basophils Absolute 05/28/2022 0.1  0.0 - 0.1 K/uL Final   Immature Granulocytes 05/28/2022 1  % Final   Abs Immature  Granulocytes 05/28/2022 0.03  0.00 - 0.07 K/uL Final   Performed at [facility], [place] [address], [place], [place] [address]   Sodium 05/28/2022 139  135 - 145 mmol/L Final   Potassium 05/28/2022 4.1  3.5 - 5.1 mmol/L Final   Chloride 05/28/2022 105  98 - 111 mmol/L Final   CO2 05/28/2022 28  22 - 32 mmol/L Final   Glucose, Bld 05/28/2022 84  70 - 99 mg/dL Final   Glucose reference range applies only to samples taken after fasting for at least 8 hours.   BUN 05/28/2022 13  6 - 20 mg/dL Final   Creatinine, Ser 05/28/2022 0.83  0.61 - 1.24 mg/dL Final   Calcium 05/28/2022 9.5  8.9 - 10.3 mg/dL Final   Total Protein 05/28/2022 6.9  6.5 - 8.1 g/dL Final   Albumin 05/28/2022 4.2  3.5 - 5.0 g/dL Final   AST 05/28/2022 21  15 - 41 U/L Final   ALT 05/28/2022 21  0 - 44 U/L Final   Alkaline Phosphatase 05/28/2022 79  38 - 126 U/L Final   Total Bilirubin 05/28/2022 0.5  0.3 - 1.2 mg/dL Final   GFR, Estimated 05/28/2022 >60  >60 mL/min Final   Comment: (NOTE) Calculated using the CKD-EPI Creatinine Equation (2021)    Anion gap 05/28/2022 6  5 - 15 Final   Performed at [facility] [address] [place], [place] [address]   Hgb A1c MFr Bld 05/28/2022 4.9  4.8 - 5.6 % Final   Comment: (NOTE) Pre diabetes:          5.7%-6.4%  Diabetes:              >  6.4%  Glycemic control for   <7.0% adults with diabetes    Mean Plasma Glucose 05/28/2022 93.93  mg/dL Final   Performed at [facility], [address] [place], [place] [address]   Cholesterol 05/28/2022 136  0 - 200 mg/dL Final   Triglycerides 57/32/2025 246 (H)  <150 mg/dL Final   HDL 42/70/6237 46  >40 mg/dL Final   Total CHOL/HDL Ratio 05/28/2022 3.0  RATIO Final   VLDL 05/28/2022 49 (H)  0 - 40 mg/dL Final   LDL Cholesterol 05/28/2022 41  0 - 99 mg/dL Final   Comment:        Total Cholesterol/HDL:CHD Risk Coronary Heart Disease Risk Table                     Men   Women  1/2 Average Risk   3.4   3.3  Average Risk        5.0   4.4  2 X Average Risk   9.6   7.1  3 X Average Risk  23.4   11.0        Use the calculated Patient Ratio above and the CHD Risk Table to determine the patient's CHD Risk.        ATP III CLASSIFICATION (LDL):  <100     mg/dL   Optimal  628-315  mg/dL   Near or Above                    Optimal  130-159  mg/dL   Borderline  176-160  mg/dL   High  >737     mg/dL   Very High Performed at [facility], [address] [place], [place] [address]    POC Amphetamine UR 05/28/2022 None Detected  NONE DETECTED (Cut Off Level 1000 ng/mL) Preliminary   POC Secobarbital (BAR) 05/28/2022 None Detected  NONE DETECTED (Cut Off Level 300 ng/mL) Preliminary   POC Buprenorphine (BUP) 05/28/2022 None Detected  NONE DETECTED (Cut Off Level 10 ng/mL) Preliminary   POC Oxazepam (BZO) 05/28/2022 None Detected  NONE DETECTED (Cut Off Level 300 ng/mL) Preliminary   POC Cocaine UR 05/28/2022 None Detected  NONE DETECTED (Cut Off Level 300 ng/mL) Preliminary   POC Methamphetamine UR 05/28/2022 None Detected  NONE DETECTED (Cut Off Level 1000 ng/mL) Preliminary   POC Morphine 05/28/2022 None Detected  NONE DETECTED (Cut Off Level 300 ng/mL) Preliminary   POC Methadone UR 05/28/2022 None Detected  NONE DETECTED (Cut Off Level 300 ng/mL) Preliminary   POC Oxycodone UR 05/28/2022 None Detected  NONE DETECTED (Cut Off Level 100 ng/mL) Preliminary   POC Marijuana UR 05/28/2022 None Detected  NONE DETECTED (Cut Off Level 50 ng/mL) Preliminary   SARSCOV2ONAVIRUS 2 AG 05/28/2022 NEGATIVE  NEGATIVE Final   Comment: (NOTE) SARS-CoV-2 antigen NOT DETECTED.   Negative results are presumptive.  Negative results do not preclude SARS-CoV-2 infection and should not be used as the sole basis for treatment or other patient management decisions, including infection  control decisions, particularly in the presence of clinical signs and  symptoms consistent with COVID-19, or in those who have been in contact with  the virus.  Negative results must be combined with clinical observations, patient history, and epidemiological information. The expected result is Negative.  Fact Sheet for Patients: https://www.jennings-kim.com/  Fact Sheet for Healthcare Providers: https://alexander-rogers.biz/  This test is not yet approved or cleared by the [place] and  has been authorized for  detection and/or diagnosis of SARS-CoV-2 by FDA under an Emergency Use Authorization (EUA).  This EUA will remain in effect (meaning this test can be used) for the duration of  the COV                          ID-19 declaration under Section 564(b)(1) of the Act, 21 U.S.C. section 360bbb-3(b)(1), unless the authorization is terminated or revoked sooner.      Allergies: Patient has no known allergies.  PTA Medications: (Not in a hospital admission)   Medical Decision Making  inpatient observation   Lab Orders         Resp Panel by RT-PCR (Flu A&B, Covid) Anterior Nasal Swab         CBC with Differential/Platelet         Comprehensive metabolic panel         Hemoglobin A1c         Lipid panel         POCT Urine Drug Screen - (I-Screen)         POC SARS Coronavirus 2 Ag      Meds ordered this encounter  Medications   acetaminophen (TYLENOL) tablet 650 mg   alum & mag hydroxide-simeth (MAALOX/MYLANTA) 200-200-20 MG/5ML suspension 30 mL   magnesium hydroxide (MILK OF MAGNESIA) suspension 30 mL   hydrOXYzine (ATARAX) tablet 25 mg     Recommendations  Based on my evaluation the patient does not appear to have an emergency medical condition.  Evette Georges, NP 05/29/22  1:40 AM

## 2022-05-28 NOTE — ED Notes (Signed)
Pt came to desk and ask for another juice saying he was thirsty. I explained to him that  I gave him three juices. He said why?I explained since he was saying he was dehydrated to drink some water and that will help him feel better than sugary juice.He just stood there and looked at me with blank stare, but finally said ok and went and got him some water. He stated we have to feed him as much as he wants due to him always eating a lot of food. I told him we will most definitely feed him, but we do have to keep in mind of the  other patients on unit and patients that come in all night that we will feed also. Pt is laying down at this time calm and cooperative

## 2022-05-28 NOTE — Progress Notes (Signed)
   05/28/22 2100  [facility] Triage Screening (Walk-ins at [facility] only)  How Did You Hear About [place]? Family/Friend  What Is the Reason for Your Visit/Call Today? Mario Thomas is a 21 year old male presenting as a voluntary walk-in to [name] due to Mario Thomas with plan. Patient denied HI and alcohol/drug usage. During assessment patient unable to share specific plan and continues to state "I'm planning, I got plans". Patient reported onset of SI was 4 years ago and has continued on and off. Patient reported auditory hallucinations, command voices and then states "they are all God", patient smiles and unable to recall what the voices are saying. Per medical record, patient was last inpatient 11/09/21. Patient denied having a current psychiatrist or therapist. Patient denied being prescribed any psych medications. Patient seeking inpatient treatment.  How Long Has This Been Causing You Problems? > than 6 months  Have You Recently Had Any Thoughts About Hurting Yourself? Yes  How long ago did you have thoughts about hurting yourself? today  Are You Planning to Commit Suicide/Harm Yourself At This time? Yes ("I have plans")  Have you Recently Had Thoughts About Hurting Someone Mario Thomas? No  Are You Planning To Harm Someone At This Time? No  Are you currently experiencing any auditory, visual or other hallucinations? Yes  Please explain the hallucinations you are currently experiencing: "command voices, they are from God"  Have You Used Any Alcohol or Drugs in the Past 24 Hours? No  Do you have any current medical co-morbidities that require immediate attention? No  Clinician description of patient physical appearance/behavior: neat / suspicious  What Do You Feel Would Help You the Most Today? Treatment for Depression or other mood problem  If access to [facility] was not available, would you have sought care in the Emergency Department? Yes  Determination of Need Urgent (48 hours)  Options  For Referral Medication Management;Outpatient Therapy;Inpatient Hospitalization

## 2022-05-28 NOTE — ED Notes (Signed)
Patient is a 21 y.o male who was admitted voluntarily with dx of generalized anxiety and SI. Patient reports loosing a close friend who died in [place]. Patient reports that he has been anxious since and has been having voices of satan and God speaking to him.  Patient A&Ox4. Denies SI/HI at the time of assessment. Patient oriented to the unit, food offered and drinks offered per his request. Denies A/VH. Patient denies any physical complaints when asked. No acute distress noted. Support and encouragement provided. Routine safety checks conducted according to facility protocol. Encouraged patient to notify staff if thoughts of harm toward self or others arise. Patient verbalize understanding and agreement. Will continue to monitor for safety and update as needed

## 2022-05-28 NOTE — ED Notes (Signed)
Pt came to nurses desk and asked for some paper to color with he was given a color book and some coloring pencils

## 2022-05-28 NOTE — ED Notes (Signed)
Pt was given chicken parm, and 3 cranberry juices

## 2022-05-28 NOTE — BH Assessment (Signed)
Mario Thomas is a 21 year old male presenting as a voluntary walk-in to [facility] due to [facility] with plan. Patient denied HI and alcohol/drug usage. During assessment patient unable to share specific plan and continues to state "I'm planning, I got plans". Patient reported onset of SI was 4 years ago and has continued on and off. Patient reported auditory hallucinations, command voices and then states "they are all God", patient smiles and unable to recall what the voices are saying. Per medical record, patient was last inpatient 11/09/21. Patient denied having a current psychiatrist or therapist. Patient denied being prescribed any psych medications. Patient seeking inpatient treatment.

## 2022-05-28 NOTE — ED Notes (Signed)
Pt is pacing in the flex area. He is constantly asking for his cell phone, we have explained once it is locked up we cant retrieve for him to call people. Pt is very anxious. Will continue to monitor pt for safety

## 2022-05-29 DIAGNOSIS — F411 Generalized anxiety disorder: Secondary | ICD-10-CM

## 2022-05-29 DIAGNOSIS — R45851 Suicidal ideations: Secondary | ICD-10-CM | POA: Diagnosis not present

## 2022-05-29 DIAGNOSIS — Z91148 Patient's other noncompliance with medication regimen for other reason: Secondary | ICD-10-CM | POA: Diagnosis not present
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
## 2022-05-29 NOTE — ED Notes (Signed)
Patient discharged home against medical advice.

## 2022-06-09 ENCOUNTER — Ambulatory Visit (INDEPENDENT_AMBULATORY_CARE_PROVIDER_SITE_OTHER)
Admission: EM | Admit: 2022-06-09 | Discharge: 2022-06-11 | Disposition: A | Discharge status: Other Acute Inpatient Hospital | Payer: 59 | Source: Home / Self Care

## 2022-06-09 ENCOUNTER — Other Ambulatory Visit: Payer: Self-pay

## 2022-06-09 DIAGNOSIS — F319 Bipolar disorder, unspecified: Secondary | ICD-10-CM

## 2022-06-09 DIAGNOSIS — Z046 Encounter for general psychiatric examination, requested by authority: Secondary | ICD-10-CM

## 2022-06-09 LAB — COMPREHENSIVE METABOLIC PANEL
ALT: 32 U/L (ref 0–44)
AST: 48 U/L — ABNORMAL HIGH (ref 15–41)
Albumin: 4.2 g/dL (ref 3.5–5.0)
Alkaline Phosphatase: 75 U/L (ref 38–126)
Anion gap: 10 (ref 5–15)
BUN: 8 mg/dL (ref 6–20)
CO2: 27 mmol/L (ref 22–32)
Calcium: 9.7 mg/dL (ref 8.9–10.3)
Chloride: 104 mmol/L (ref 98–111)
Creatinine, Ser: 0.78 mg/dL (ref 0.61–1.24)
GFR, Estimated: >60 mL/min (ref >60)
Glucose, Bld: 77 mg/dL (ref 70–99)
Potassium: 3.8 mmol/L (ref 3.5–5.1)
Sodium: 141 mmol/L (ref 135–145)
Total Bilirubin: 0.9 mg/dL (ref 0.3–1.2)
Total Protein: 6.9 g/dL (ref 6.5–8.1)

## 2022-06-09 LAB — CBC WITH DIFFERENTIAL/PLATELET
Abs Immature Granulocytes: 0.03 10*3/uL (ref 0.00–0.07)
Basophils Absolute: 0.1 10*3/uL (ref 0.0–0.1)
Basophils Relative: 1 %
Eosinophils Absolute: 0.2 10*3/uL (ref 0.0–0.5)
Eosinophils Relative: 3 %
HCT: 46.5 % (ref 39.0–52.0)
Hemoglobin: 15.4 g/dL (ref 13.0–17.0)
Immature Granulocytes: 0 %
Lymphocytes Relative: 28 %
Lymphs Abs: 2.1 10*3/uL (ref 0.7–4.0)
MCH: 29.9 pg (ref 26.0–34.0)
MCHC: 33.1 g/dL (ref 30.0–36.0)
MCV: 90.3 fL (ref 80.0–100.0)
Monocytes Absolute: 0.5 10*3/uL (ref 0.1–1.0)
Monocytes Relative: 7 %
Neutro Abs: 4.7 10*3/uL (ref 1.7–7.7)
Neutrophils Relative %: 61 %
Platelets: 198 10*3/uL (ref 150–400)
RBC: 5.15 MIL/uL (ref 4.22–5.81)
RDW: 12.7 % (ref 11.5–15.5)
WBC: 7.6 10*3/uL (ref 4.0–10.5)
nRBC: 0 % (ref 0.0–0.2)

## 2022-06-09 LAB — RESP PANEL BY RT-PCR (FLU A&B, COVID) ARPGX2
Influenza A by PCR: NEGATIVE
Influenza B by PCR: NEGATIVE
SARS Coronavirus 2 by RT PCR: NEGATIVE

## 2022-06-09 MED ORDER — MAGNESIUM HYDROXIDE 400 MG/5ML PO SUSP: 30.0000 mL | Freq: Every day | ORAL | Status: DC | PRN

## 2022-06-09 MED ORDER — TRAZODONE HCL 50 MG PO TABS
50.0000 mg | ORAL_TABLET | Freq: Every evening | ORAL | Status: DC | PRN
  Administered 2022-06-09 – 2022-06-10 (×2): 50 mg via ORAL
  Filled 2022-06-09 (×2): qty 1

## 2022-06-09 MED ORDER — HYDROXYZINE HCL 25 MG PO TABS
25.0000 mg | ORAL_TABLET | Freq: Three times a day (TID) | ORAL | Status: DC | PRN
  Administered 2022-06-09 – 2022-06-10 (×2): 25 mg via ORAL
  Filled 2022-06-09 (×2): qty 1

## 2022-06-09 MED ORDER — ALUM & MAG HYDROXIDE-SIMETH 200-200-20 MG/5ML PO SUSP: 30.0000 mL | ORAL | Status: DC | PRN

## 2022-06-09 MED ORDER — PALIPERIDONE ER 6 MG PO TB24
6.0000 mg | ORAL_TABLET | Freq: Every day | ORAL | Status: DC
Start: 2022-06-09 — End: 2022-06-11
  Administered 2022-06-09 – 2022-06-11 (×3): 6 mg via ORAL
  Filled 2022-06-09 (×3): qty 1

## 2022-06-09 MED ORDER — ACETAMINOPHEN 325 MG PO TABS: 650.0000 mg | ORAL_TABLET | Freq: Four times a day (QID) | ORAL | Status: DC | PRN

## 2022-06-09 MED ORDER — BENZTROPINE MESYLATE 1 MG PO TABS
1.0000 mg | ORAL_TABLET | Freq: Every day | ORAL | Status: DC
  Administered 2022-06-09 – 2022-06-10 (×2): 1 mg via ORAL
  Filled 2022-06-09 (×2): qty 1

## 2022-06-09 NOTE — BH Assessment (Signed)
Mario Thomas, Emergent; 21 years old Pt presents involuntarily to  [facility], via [employer]  and unaccompanied. Pt IVC'd reads "Schizophrenic, Bipolar Disorder Not taking meds, states to his parents today is the last day that they will live, suicidal, committed several times in the past; last was December, trying to take a car parked in the garage which is in front of  two other vehicles w/o moving those vehicles. Pt denies SI, HI, or AVH.  Pt reports he drank two days ago, unknown beverage. Pt admits to prior MH diagnosis or prescribed medication for symptom management.  Pt refused to signed MSE.
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
## 2022-06-09 NOTE — ED Provider Notes (Signed)
[facility] Continuous Assessment Admission H&P  Date: 06/10/22 Patient Name: Mario Thomas MRN: [id number] Chief Complaint:  Chief Complaint  Patient presents with   IVC    Delusional      Diagnoses:  Final diagnoses:  Bipolar 1 disorder (HCC)    HPI: Mario Thomas is a 21 year old male who presented to [facility] via law enforcement under involuntary commitment paperwork for mental health evaluation. Per IVC: "Schizophrenic, bipolar disorder. Not taking meds. States to his parents today is the last day that they will live. Suicidal. Committed several times in the past, last was December.. Trying to take car parked in the garage which is in front of two other vehicles without moving the vehicles."   Patient was seen face-to-face and his chart was reviewed by this nurse practitioner.  On evaluation, patient is alert and oriented x4. He is noted with slightly pressured speech. He is cooperative and makes good eye contact. His mood is anxious with congruent affect. Patient's though content is delusional. No evidence that he is responding to internal/external stimuli at this time.   Patient reports he was brought to [name] because his parents are concern about his wellbeing. He says he was trying to move a car out of the garage and his parent became worried because he was not driving properly. He says he is diagnosed with schizophrenia and bipolar 1 disorder.  Patient reports that he has not been taking his medication because " I have been tapping into a higher power and I have outgrown the symptoms of my mental health diseases." He says he sometimes experiences auditory hallucination of "hearing the voice of God when praying." He says "the voice is comforts and says good things to me. He has helped my depression. I stopped being depressed since I talk to God." He denies suicidal ideation, he denies homicidal ideation, and paranoia. He says he drinks 1-2 shots of liquor per week he denies all other substance  abuse.   PHQ 2-9:   Flowsheet Row ED from 06/09/2022 in [facility] ED from 05/28/2022 in [facility] Ad[facility] (Discharged) [facility]  C-SSRS RISK CATEGORY No Risk Error: Question 2 not populated No Risk        Total Time spent with patient: 15 minutes  Musculoskeletal  Strength & Muscle Tone: within normal limits Gait & Station: normal Patient leans: Right  Psychiatric Specialty Exam  Presentation General Appearance: Fairly Groomed  Eye Contact:Good  Speech:Pressured  Speech Volume:Normal  Handedness:Right   Mood and Affect  Mood:Euthymic  Affect:Congruent   Thought Process  Thought Processes:Linear  Descriptions of Associations:Loose  Orientation:Full (Time, Place and Person)  Thought Content:Logical  Diagnosis of Schizophrenia or Schizoaffective disorder in past: Yes  Duration of Psychotic Symptoms: Greater than six months  Hallucinations:Hallucinations: Auditory Description of Auditory Hallucinations: "Voice of God saying comforting things"  Ideas of Reference:None  Suicidal Thoughts:Suicidal Thoughts: Yes, Passive SI Passive Intent and/or Plan: Without Plan; Without Intent  Homicidal Thoughts:Homicidal Thoughts: No   Sensorium  Memory:Immediate Good; Recent Good; Remote Fair  Judgment:Fair  Insight:Fair   Executive Functions  Concentration:Fair  Attention Span:Fair  Recall:Fair  Fund of Knowledge:Good  Language:Good   Psychomotor Activity  Psychomotor Activity:Psychomotor Activity: Normal   Assets  Assets:Desire for Improvement; Manufacturing systems engineer; Financial Resources/Insurance; Housing; Physical Health   Sleep  Sleep:Sleep: Fair Number of Hours of Sleep: 6   Nutritional Assessment (For OBS and FBC admissions only) Has  the patient had a weight loss or gain of 10 pounds or more in the last 3 months?: No Has the  patient had a decrease in food intake/or appetite?: No Does the patient have dental problems?: No Does the patient have eating habits or behaviors that may be indicators of an eating disorder including binging or inducing vomiting?: No Has the patient recently lost weight without trying?: 0 Has the patient been eating poorly because of a decreased appetite?: 0 Malnutrition Screening Tool Score: 0    Physical Exam Vitals and nursing note reviewed.  Constitutional:      General: He is not in acute distress.    Appearance: He is well-developed.  HENT:     Head: Normocephalic and atraumatic.  Eyes:     Conjunctiva/sclera: Conjunctivae normal.  Cardiovascular:     Rate and Rhythm: Normal rate.  Pulmonary:     Effort: Pulmonary effort is normal. No respiratory distress.     Breath sounds: Normal breath sounds.  Abdominal:     Palpations: Abdomen is soft.     Tenderness: There is no abdominal tenderness.  Musculoskeletal:        General: No swelling.     Cervical back: Neck supple.  Skin:    General: Skin is warm and dry.     Capillary Refill: Capillary refill takes less than 2 seconds.  Neurological:     Mental Status: He is alert and oriented to person, place, and time.  Psychiatric:        Attention and Perception: Attention and perception normal.        Mood and Affect: Mood is anxious.        Speech: Speech normal.        Behavior: Behavior normal. Behavior is cooperative.        Thought Content: Thought content is delusional. Thought content is not paranoid. Thought content does not include homicidal or suicidal ideation. Thought content does not include homicidal or suicidal plan.        Cognition and Memory: Cognition normal.    Review of Systems  Constitutional: Negative.   HENT: Negative.    Eyes: Negative.   Respiratory: Negative.    Cardiovascular: Negative.   Gastrointestinal: Negative.   Genitourinary: Negative.   Musculoskeletal: Negative.   Skin: Negative.    Neurological: Negative.   Endo/Heme/Allergies: Negative.   Psychiatric/Behavioral:  Positive for hallucinations. The patient is nervous/anxious.     Blood pressure (!) 142/82, pulse 76, temperature 98.1 F (36.7 C), temperature source Oral, resp. rate 20, SpO2 100 %. There is no height or weight on file to calculate BMI.  Past Psychiatric History: MDD, Bipolar 1   Is the patient at risk to self? No  Has the patient been a risk to self in the past 6 months? No .    Has the patient been a risk to self within the distant past? No   Is the patient a risk to others? No   Has the patient been a risk to others in the past 6 months? No   Has the patient been a risk to others within the distant past? Yes   Past Medical History:  Past Medical History:  Diagnosis Date   Heart murmur    MDD (major depressive disorder), severe (HCC) 08/23/2018   No past surgical history on file.  Family History:  Family History  Problem Relation Age of Onset   Hyperlipidemia Father     Social History:  Social History  Socioeconomic History   Marital status: Single    Spouse name: Not on file   Number of children: Not on file   Years of education: Not on file   Highest education level: Not on file  Occupational History   Not on file  Tobacco Use   Smoking status: Never   Smokeless tobacco: Never  Vaping Use   Vaping Use: Never used  Substance and Sexual Activity   Alcohol use: Never   Drug use: Never   Sexual activity: Never  Other Topics Concern   Not on file  Social History Narrative   01/23/21   From: the area   Living: with parents   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, 1 sibling      Enjoys: running      Exercise: daily running    Diet: meat, veggies, grains      Safety   Seat belts: Yes    Guns: No   Safe in relationships: Yes    Social Determinants of Corporate investment banker Strain: Not on file   Food Insecurity: Not on file  Transportation Needs: Not on file  Physical Activity: Not on file  Stress: Not on file  Social Connections: Not on file  Intimate Partner Violence: Not on file    SDOH:  SDOH Screenings   Alcohol Screen: Low Risk  (11/09/2021)   Alcohol Screen    Last Alcohol Screening Score (AUDIT): 0  Depression (PHQ2-9): Low Risk  (11/27/2019)   Depression (PHQ2-9)    PHQ-2 Score: 0  Financial Resource Strain: Not on file  Food Insecurity: Not on file  Housing: Not on file  Physical Activity: Not on file  Social Connections: Not on file  Stress: Not on file  Tobacco Use: Low Risk  (11/09/2021)   Patient History    Smoking Tobacco Use: Never    Smokeless Tobacco Use: Never    Passive Exposure: Not on file  Transportation Needs: Not on file    Last Labs:  Admission on 06/09/2022  Component Date Value Ref Range Status   SARS Coronavirus 2 by RT PCR 06/09/2022 NEGATIVE  NEGATIVE Final   Comment: (NOTE) SARS-CoV-2 target nucleic acids are NOT DETECTED.  The SARS-CoV-2 RNA is generally detectable in upper respiratory specimens during the acute phase of infection. The lowest concentration of SARS-CoV-2 viral copies this assay can detect is 138 copies/mL. A negative result does not preclude SARS-Cov-2 infection and should not be used as the sole basis for treatment or other patient management decisions. A negative result may occur with  improper specimen collection/handling, submission of specimen other than nasopharyngeal swab, presence of viral mutation(s) within the areas targeted by this assay, and inadequate number of viral copies(<138 copies/mL). A negative result must be combined with clinical observations, patient history, and epidemiological information. The expected result is Negative.  Fact Sheet for Patients:  BloggerCourse.com  Fact Sheet for Healthcare Providers:  SeriousBroker.it  This  test is no                          t yet approved or cleared by the [place] FDA and  has been authorized for detection and/or diagnosis of SARS-CoV-2 by FDA under an Emergency Use Authorization (EUA). This EUA will remain  in effect (meaning this test can be used) for the duration of the COVID-19 declaration under Section  564(b)(1) of the Act, 21 U.S.C.section 360bbb-3(b)(1), unless the authorization is terminated  or revoked sooner.       Influenza A by PCR 06/09/2022 NEGATIVE  NEGATIVE Final   Influenza B by PCR 06/09/2022 NEGATIVE  NEGATIVE Final   Comment: (NOTE) The Xpert Xpress SARS-CoV-2/FLU/RSV plus assay is intended as an aid in the diagnosis of influenza from Nasopharyngeal swab specimens and should not be used as a sole basis for treatment. Nasal washings and aspirates are unacceptable for Xpert Xpress SARS-CoV-2/FLU/RSV testing.  Fact Sheet for Patients: BloggerCourse.com  Fact Sheet for Healthcare Providers: SeriousBroker.it  This test is not yet approved or cleared by the [place] FDA and has been authorized for detection and/or diagnosis of SARS-CoV-2 by FDA under an Emergency Use Authorization (EUA). This EUA will remain in effect (meaning this test can be used) for the duration of the COVID-19 declaration under Section 564(b)(1) of the Act, 21 U.S.C. section 360bbb-3(b)(1), unless the authorization is terminated or revoked.  Performed at [facility], [address], [place], [place] [address]    WBC 06/09/2022 7.6  4.0 - 10.5 K/uL Final   RBC 06/09/2022 5.15  4.22 - 5.81 MIL/uL Final   Hemoglobin 06/09/2022 15.4  13.0 - 17.0 g/dL Final   HCT 99/24/2683 46.5  39.0 - 52.0 % Final   MCV 06/09/2022 90.3  80.0 - 100.0 fL Final   MCH 06/09/2022 29.9  26.0 - 34.0 pg Final   MCHC 06/09/2022 33.1  30.0 - 36.0 g/dL Final   RDW 41/96/2229 12.7  11.5 - 15.5 % Final   Platelets 06/09/2022 198  150 -  400 K/uL Final   nRBC 06/09/2022 0.0  0.0 - 0.2 % Final   Neutrophils Relative % 06/09/2022 61  % Final   Neutro Abs 06/09/2022 4.7  1.7 - 7.7 K/uL Final   Lymphocytes Relative 06/09/2022 28  % Final   Lymphs Abs 06/09/2022 2.1  0.7 - 4.0 K/uL Final   Monocytes Relative 06/09/2022 7  % Final   Monocytes Absolute 06/09/2022 0.5  0.1 - 1.0 K/uL Final   Eosinophils Relative 06/09/2022 3  % Final   Eosinophils Absolute 06/09/2022 0.2  0.0 - 0.5 K/uL Final   Basophils Relative 06/09/2022 1  % Final   Basophils Absolute 06/09/2022 0.1  0.0 - 0.1 K/uL Final   Immature Granulocytes 06/09/2022 0  % Final   Abs Immature Granulocytes 06/09/2022 0.03  0.00 - 0.07 K/uL Final   Performed at [facility], [address] [place], [place] [address]   Sodium 06/09/2022 141  135 - 145 mmol/L Final   Potassium 06/09/2022 3.8  3.5 - 5.1 mmol/L Final   Chloride 06/09/2022 104  98 - 111 mmol/L Final   CO2 06/09/2022 27  22 - 32 mmol/L Final   Glucose, Bld 06/09/2022 77  70 - 99 mg/dL Final   Glucose reference range applies only to samples taken after fasting for at least 8 hours.   BUN 06/09/2022 8  6 - 20 mg/dL Final   Creatinine, Ser 06/09/2022 0.78  0.61 - 1.24 mg/dL Final   Calcium 11/94/1740 9.7  8.9 - 10.3 mg/dL Final   Total Protein 81/44/8185 6.9  6.5 - 8.1 g/dL Final   Albumin 63/14/9702 4.2  3.5 - 5.0 g/dL Final   AST 63/78/5885 48 (H)  15 - 41 U/L Final   ALT 06/09/2022 32  0 - 44 U/L Final   Alkaline Phosphatase 06/09/2022 75  38 -  126 U/L Final   Total Bilirubin 06/09/2022 0.9  0.3 - 1.2 mg/dL Final   GFR, Estimated 06/09/2022 >60  >60 mL/min Final   Comment: (NOTE) Calculated using the CKD-EPI Creatinine Equation (2021)    Anion gap 06/09/2022 10  5 - 15 Final   Performed at [facility], [address], [place], [place] [address]  Admission on 05/28/2022, Discharged on 05/29/2022  Component Date Value Ref Range Status   SARS Coronavirus 2 by RT PCR 05/28/2022 NEGATIVE   NEGATIVE Final   Comment: (NOTE) SARS-CoV-2 target nucleic acids are NOT DETECTED.  The SARS-CoV-2 RNA is generally detectable in upper respiratory specimens during the acute phase of infection. The lowest concentration of SARS-CoV-2 viral copies this assay can detect is 138 copies/mL. A negative result does not preclude SARS-Cov-2 infection and should not be used as the sole basis for treatment or other patient management decisions. A negative result may occur with  improper specimen collection/handling, submission of specimen other than nasopharyngeal swab, presence of viral mutation(s) within the areas targeted by this assay, and inadequate number of viral copies(<138 copies/mL). A negative result must be combined with clinical observations, patient history, and epidemiological information. The expected result is Negative.  Fact Sheet for Patients:  BloggerCourse.com  Fact Sheet for Healthcare Providers:  SeriousBroker.it  This test is no                          t yet approved or cleared by the [place] FDA and  has been authorized for detection and/or diagnosis of SARS-CoV-2 by FDA under an Emergency Use Authorization (EUA). This EUA will remain  in effect (meaning this test can be used) for the duration of the COVID-19 declaration under Section 564(b)(1) of the Act, 21 U.S.C.section 360bbb-3(b)(1), unless the authorization is terminated  or revoked sooner.       Influenza A by PCR 05/28/2022 NEGATIVE  NEGATIVE Final   Influenza B by PCR 05/28/2022 NEGATIVE  NEGATIVE Final   Comment: (NOTE) The Xpert Xpress SARS-CoV-2/FLU/RSV plus assay is intended as an aid in the diagnosis of influenza from Nasopharyngeal swab specimens and should not be used as a sole basis for treatment. Nasal washings and aspirates are unacceptable for Xpert Xpress SARS-CoV-2/FLU/RSV testing.  Fact Sheet for  Patients: BloggerCourse.com  Fact Sheet for Healthcare Providers: SeriousBroker.it  This test is not yet approved or cleared by the [place] FDA and has been authorized for detection and/or diagnosis of SARS-CoV-2 by FDA under an Emergency Use Authorization (EUA). This EUA will remain in effect (meaning this test can be used) for the duration of the COVID-19 declaration under Section 564(b)(1) of the Act, 21 U.S.C. section 360bbb-3(b)(1), unless the authorization is terminated or revoked.  Performed at [facility], [address] [place], [place] [address]    WBC 05/28/2022 6.2  4.0 - 10.5 K/uL Final   RBC 05/28/2022 5.17  4.22 - 5.81 MIL/uL Final   Hemoglobin 05/28/2022 15.8  13.0 - 17.0 g/dL Final   HCT 96/29/5284 46.8  39.0 - 52.0 % Final   MCV 05/28/2022 90.5  80.0 - 100.0 fL Final   MCH 05/28/2022 30.6  26.0 - 34.0 pg Final   MCHC 05/28/2022 33.8  30.0 - 36.0 g/dL Final   RDW 13/24/4010 12.3  11.5 - 15.5 % Final   Platelets 05/28/2022 185  150 - 400 K/uL Final   nRBC 05/28/2022 0.0  0.0 - 0.2 %  Final   Neutrophils Relative % 05/28/2022 54  % Final   Neutro Abs 05/28/2022 3.4  1.7 - 7.7 K/uL Final   Lymphocytes Relative 05/28/2022 32  % Final   Lymphs Abs 05/28/2022 2.0  0.7 - 4.0 K/uL Final   Monocytes Relative 05/28/2022 8  % Final   Monocytes Absolute 05/28/2022 0.5  0.1 - 1.0 K/uL Final   Eosinophils Relative 05/28/2022 4  % Final   Eosinophils Absolute 05/28/2022 0.2  0.0 - 0.5 K/uL Final   Basophils Relative 05/28/2022 1  % Final   Basophils Absolute 05/28/2022 0.1  0.0 - 0.1 K/uL Final   Immature Granulocytes 05/28/2022 1  % Final   Abs Immature Granulocytes 05/28/2022 0.03  0.00 - 0.07 K/uL Final   Performed at [facility], [address], [place], [place] [address]   Sodium 05/28/2022 139  135 - 145 mmol/L Final   Potassium 05/28/2022 4.1  3.5 - 5.1 mmol/L Final   Chloride 05/28/2022 105  98 -  111 mmol/L Final   CO2 05/28/2022 28  22 - 32 mmol/L Final   Glucose, Bld 05/28/2022 84  70 - 99 mg/dL Final   Glucose reference range applies only to samples taken after fasting for at least 8 hours.   BUN 05/28/2022 13  6 - 20 mg/dL Final   Creatinine, Ser 05/28/2022 0.83  0.61 - 1.24 mg/dL Final   Calcium 60/45/4098 9.5  8.9 - 10.3 mg/dL Final   Total Protein 11/91/4782 6.9  6.5 - 8.1 g/dL Final   Albumin 95/62/1308 4.2  3.5 - 5.0 g/dL Final   AST 65/78/4696 21  15 - 41 U/L Final   ALT 05/28/2022 21  0 - 44 U/L Final   Alkaline Phosphatase 05/28/2022 79  38 - 126 U/L Final   Total Bilirubin 05/28/2022 0.5  0.3 - 1.2 mg/dL Final   GFR, Estimated 05/28/2022 >60  >60 mL/min Final   Comment: (NOTE) Calculated using the CKD-EPI Creatinine Equation (2021)    Anion gap 05/28/2022 6  5 - 15 Final   Performed at [facility], [address], [place], [place] [address]   Hgb A1c MFr Bld 05/28/2022 4.9  4.8 - 5.6 % Final   Comment: (NOTE) Pre diabetes:          5.7%-6.4%  Diabetes:              >6.4%  Glycemic control for   <7.0% adults with diabetes    Mean Plasma Glucose 05/28/2022 93.93  mg/dL Final   Performed at [facility], [address] [place], [place] [address]   Cholesterol 05/28/2022 136  0 - 200 mg/dL Final   Triglycerides 40/09/2724 246 (H)  <150 mg/dL Final   HDL 36/64/4034 46  >40 mg/dL Final   Total CHOL/HDL Ratio 05/28/2022 3.0  RATIO Final   VLDL 05/28/2022 49 (H)  0 - 40 mg/dL Final   LDL Cholesterol 05/28/2022 41  0 - 99 mg/dL Final   Comment:        Total Cholesterol/HDL:CHD Risk Coronary Heart Disease Risk Table                     Men   Women  1/2 Average Risk   3.4   3.3  Average Risk       5.0   4.4  2 X Average Risk   9.6   7.1  3 X Average Risk  23.4  11.0        Use the calculated Patient Ratio above and the CHD Risk Table to determine the patient's CHD Risk.        ATP III CLASSIFICATION (LDL):  <100     mg/dL   Optimal  703-500   mg/dL   Near or Above                    Optimal  130-159  mg/dL   Borderline  938-182  mg/dL   High  >993     mg/dL   Very High Performed at [facility], [address] [place], [place] [address]    POC Amphetamine UR 05/28/2022 None Detected  NONE DETECTED (Cut Off Level 1000 ng/mL) Preliminary   POC Secobarbital (BAR) 05/28/2022 None Detected  NONE DETECTED (Cut Off Level 300 ng/mL) Preliminary   POC Buprenorphine (BUP) 05/28/2022 None Detected  NONE DETECTED (Cut Off Level 10 ng/mL) Preliminary   POC Oxazepam (BZO) 05/28/2022 None Detected  NONE DETECTED (Cut Off Level 300 ng/mL) Preliminary   POC Cocaine UR 05/28/2022 None Detected  NONE DETECTED (Cut Off Level 300 ng/mL) Preliminary   POC Methamphetamine UR 05/28/2022 None Detected  NONE DETECTED (Cut Off Level 1000 ng/mL) Preliminary   POC Morphine 05/28/2022 None Detected  NONE DETECTED (Cut Off Level 300 ng/mL) Preliminary   POC Methadone UR 05/28/2022 None Detected  NONE DETECTED (Cut Off Level 300 ng/mL) Preliminary   POC Oxycodone UR 05/28/2022 None Detected  NONE DETECTED (Cut Off Level 100 ng/mL) Preliminary   POC Marijuana UR 05/28/2022 None Detected  NONE DETECTED (Cut Off Level 50 ng/mL) Preliminary   SARSCOV2ONAVIRUS 2 AG 05/28/2022 NEGATIVE  NEGATIVE Final   Comment: (NOTE) SARS-CoV-2 antigen NOT DETECTED.   Negative results are presumptive.  Negative results do not preclude SARS-CoV-2 infection and should not be used as the sole basis for treatment or other patient management decisions, including infection  control decisions, particularly in the presence of clinical signs and  symptoms consistent with COVID-19, or in those who have been in contact with the virus.  Negative results must be combined with clinical observations, patient history, and epidemiological information. The expected result is Negative.  Fact Sheet for Patients: https://www.jennings-kim.com/  Fact Sheet for Healthcare  Providers: https://alexander-rogers.biz/  This test is not yet approved or cleared by the [place] FDA and  has been authorized for detection and/or diagnosis of SARS-CoV-2 by FDA under an Emergency Use Authorization (EUA).  This EUA will remain in effect (meaning this test can be used) for the duration of  the COV                          ID-19 declaration under Section 564(b)(1) of the Act, 21 U.S.C. section 360bbb-3(b)(1), unless the authorization is terminated or revoked sooner.      Allergies: Patient has no known allergies.  PTA Medications: (Not in a hospital admission)   Medical Decision Making  Patient will be admitted to [facility] for continuus assessment pending collateral from IVC petitioner (patient's father).  -Restart Invega at 6mg /day, titrate up as needed  for Bipolar disorder -Cogentin 1mg /day for medication related extrapyramidal reaction  -Hydroxyzine 25mg  TID prn for anxiety   Recommendations  Based on my evaluation the patient does not appear to have an emergency medical condition.  , NP 06/10/22  5:30 AM

## 2022-06-09 NOTE — ED Notes (Signed)
Pt under IVC by parents, presents with homicidal ideations, pt states to parents this is the last day they will live.  Pt took shower, comfort measures given.  Pt calm & cooperative at present.  Monitoring for safety.

## 2022-06-09 NOTE — ED Notes (Signed)
Pt awake & resting at present.  No distress noted.  Monitoring for safety. 

## 2022-06-10 NOTE — ED Notes (Signed)
Pt was given a sub, chips, and juice for lunch.  

## 2022-06-10 NOTE — ED Notes (Signed)
Pt was given a salad and juice for dinner. 

## 2022-06-10 NOTE — ED Notes (Signed)
Pt was given muffin and juice for breakfast.  

## 2022-06-10 NOTE — ED Notes (Signed)
Patient presents cooperative , pleasant this am. He states he is feeling fine and asks when he will see a provider to leave. Educated on provider rounds. Patient does appear to have odd mannerisms, indicative that he is having trouble processing information in some moments,, he blinks rapidly and wipes his face over a few times when asked what brought him to the hospital, appears to be preoccupied at times. He reports '' my family was just worried about me.my mental well being. '' Patient states he has hx of being on '' mood medication '' and found it helpful. Patient does report he can hear the voice of God but states this is not bothering him and is '' a good thing'' patient compliant with am medication, offered breakfast, and reminded of need for urine specimen.

## 2022-06-10 NOTE — ED Notes (Signed)
Pt sleeping at present, no distress noted. Respirations even & unlabored.  Monitoring for safety. 

## 2022-06-10 NOTE — ED Notes (Signed)
Received patient this PM. Patient is sleeping in his bed. No acute distress noted. Patient respirations are even and unlabored. Will continue to monitor for safety.

## 2022-06-10 NOTE — ED Provider Notes (Signed)
Behavioral Health Progress Note  Date and Time: 06/10/2022 4:11 PM Name: Mario Thomas MRN:  [id number]  Subjective:  Mario Thomas is a 21 year old male that presents under involuntary commitment due to noncompliance with medication and a charted history with schizophrenia, major depressive disorder, generalized anxiety disorder and bipolar disorder 1 disorder.   Mario Thomas was seen and evaluated face-to-face.  He appears to be responding to internal stimuli mild thought blocking noted throughout this assessment.    Nursing staff reports patient has been compliant with medication.  He continues to deny suicidal or homicidal ideations.  Patient was asked about voices stating " not really, sometimes I hear voices but they are not too bad."  Denies that he has been followed by therapy or psychiatry.  Denies psychotropic medications.  Reports a fair appetite.  States he is resting well throughout the night.  CSW to continue seeking inpatient admission.   Diagnosis:  Final diagnoses:  Bipolar 1 disorder (HCC)    Total Time spent with patient: 15 minutes  Past Psychiatric History:  Past Medical History:  Past Medical History:  Diagnosis Date   Heart murmur    MDD (major depressive disorder), severe (HCC) 08/23/2018   No past surgical history on file. Family History:  Family History  Problem Relation Age of Onset   Hyperlipidemia Father    Family Psychiatric  History:  Social History:  Social History   Substance and Sexual Activity  Alcohol Use Never     Social History   Substance and Sexual Activity  Drug Use Never    Social History   Socioeconomic History   Marital status: Single    Spouse name: Not on file   Number of children: Not on file   Years of education: Not on file   Highest education level: Not on file  Occupational History   Not on file  Tobacco Use   Smoking status: Never   Smokeless tobacco: Never  Vaping Use   Vaping Use: Never used  Substance and Sexual Activity    Alcohol use: Never   Drug use: Never   Sexual activity: Never  Other Topics Concern   Not on file  Social History Narrative   01/23/21   From: the area   Living: with parents   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, 1 sibling      Enjoys: running      Exercise: daily running    Diet: meat, veggies, grains      Safety   Seat belts: Yes    Guns: No   Safe in relationships: Yes    Social Determinants of Corporate investment banker Strain: Not on file  Food Insecurity: Not on file  Transportation Needs: Not on file  Physical Activity: Not on file  Stress: Not on file  Social Connections: Not on file   SDOH:  SDOH Screenings   Alcohol Screen: Low Risk  (11/09/2021)   Alcohol Screen    Last Alcohol Screening Score (AUDIT): 0  Depression (PHQ2-9): Low Risk  (11/27/2019)   Depression (PHQ2-9)    PHQ-2 Score: 0  Financial Resource Strain: Not on file  Food Insecurity: Not on file  Housing: Not on file  Physical Activity: Not on file  Social Connections: Not on file  Stress: Not on file  Tobacco Use: Low Risk  (11/09/2021)   Patient History    Smoking Tobacco Use: Never  Smokeless Tobacco Use: Never    Passive Exposure: Not on file  Transportation Needs: Not on file   Additional Social History:    Pain Medications: See MAR Prescriptions: See MAR Over the Counter: See MAR History of alcohol / drug use?: Yes (Pt reports he recent started drinking liquor but not to intoxication.)                    Sleep: Fair  Appetite:  Fair  Current Medications:  Current Facility-Administered Medications  Medication Dose Route Frequency Provider Last Rate Last Admin   acetaminophen (TYLENOL) tablet 650 mg  650 mg Oral Q6H PRN Ajibola, Ene A, NP       alum & mag hydroxide-simeth (MAALOX/MYLANTA) 200-200-20 MG/5ML suspension 30 mL  30 mL Oral Q4H PRN Ajibola, Ene A, NP       benztropine (COGENTIN) tablet  1 mg  1 mg Oral QHS Ajibola, Ene A, NP   1 mg at 06/09/22 2237   hydrOXYzine (ATARAX) tablet 25 mg  25 mg Oral TID PRN Ajibola, Ene A, NP   25 mg at 06/09/22 2237   magnesium hydroxide (MILK OF MAGNESIA) suspension 30 mL  30 mL Oral Daily PRN Ajibola, Ene A, NP       paliperidone (INVEGA) 24 hr tablet 6 mg  6 mg Oral Daily Ajibola, Ene A, NP   6 mg at 06/10/22 0953   traZODone (DESYREL) tablet 50 mg  50 mg Oral QHS PRN Ajibola, Ene A, NP   50 mg at 06/09/22 2237   No current outpatient medications on file.    Labs  Lab Results:  Admission on 06/09/2022  Component Date Value Ref Range Status   SARS Coronavirus 2 by RT PCR 06/09/2022 NEGATIVE  NEGATIVE Final   Comment: (NOTE) SARS-CoV-2 target nucleic acids are NOT DETECTED.  The SARS-CoV-2 RNA is generally detectable in upper respiratory specimens during the acute phase of infection. The lowest concentration of SARS-CoV-2 viral copies this assay can detect is 138 copies/mL. A negative result does not preclude SARS-Cov-2 infection and should not be used as the sole basis for treatment or other patient management decisions. A negative result may occur with  improper specimen collection/handling, submission of specimen other than nasopharyngeal swab, presence of viral mutation(s) within the areas targeted by this assay, and inadequate number of viral copies(<138 copies/mL). A negative result must be combined with clinical observations, patient history, and epidemiological information. The expected result is Negative.  Fact Sheet for Patients:  BloggerCourse.com  Fact Sheet for Healthcare Providers:  SeriousBroker.it  This test is no                          t yet approved or cleared by the [place] FDA and  has been authorized for detection and/or diagnosis of SARS-CoV-2 by FDA under an Emergency Use Authorization (EUA). This EUA will remain  in effect (meaning this test can be  used) for the duration of the COVID-19 declaration under Section 564(b)(1) of the Act, 21 U.S.C.section 360bbb-3(b)(1), unless the authorization is terminated  or revoked sooner.       Influenza A by PCR 06/09/2022 NEGATIVE  NEGATIVE Final   Influenza B by PCR 06/09/2022 NEGATIVE  NEGATIVE Final   Comment: (NOTE) The Xpert Xpress SARS-CoV-2/FLU/RSV plus assay is intended as an aid in the diagnosis of influenza from Nasopharyngeal swab specimens and should not be used as a sole basis for treatment. Nasal  washings and aspirates are unacceptable for Xpert Xpress SARS-CoV-2/FLU/RSV testing.  Fact Sheet for Patients: BloggerCourse.comhttps://www.fda.gov/media/152166/download  Fact Sheet for Healthcare Providers: SeriousBroker.ithttps://www.fda.gov/media/152162/download  This test is not yet approved or cleared by the [place] FDA and has been authorized for detection and/or diagnosis of SARS-CoV-2 by FDA under an Emergency Use Authorization (EUA). This EUA will remain in effect (meaning this test can be used) for the duration of the COVID-19 declaration under Section 564(b)(1) of the Act, 21 U.S.C. section 360bbb-3(b)(1), unless the authorization is terminated or revoked.  Performed at [facility], [address] [place], [place] [address]    WBC 06/09/2022 7.6  4.0 - 10.5 K/uL Final   RBC 06/09/2022 5.15  4.22 - 5.81 MIL/uL Final   Hemoglobin 06/09/2022 15.4  13.0 - 17.0 g/dL Final   HCT 60/45/409807/12/2021 46.5  39.0 - 52.0 % Final   MCV 06/09/2022 90.3  80.0 - 100.0 fL Final   MCH 06/09/2022 29.9  26.0 - 34.0 pg Final   MCHC 06/09/2022 33.1  30.0 - 36.0 g/dL Final   RDW 11/91/478207/12/2021 12.7  11.5 - 15.5 % Final   Platelets 06/09/2022 198  150 - 400 K/uL Final   nRBC 06/09/2022 0.0  0.0 - 0.2 % Final   Neutrophils Relative % 06/09/2022 61  % Final   Neutro Abs 06/09/2022 4.7  1.7 - 7.7 K/uL Final   Lymphocytes Relative 06/09/2022 28  % Final   Lymphs Abs 06/09/2022 2.1  0.7 - 4.0 K/uL Final   Monocytes  Relative 06/09/2022 7  % Final   Monocytes Absolute 06/09/2022 0.5  0.1 - 1.0 K/uL Final   Eosinophils Relative 06/09/2022 3  % Final   Eosinophils Absolute 06/09/2022 0.2  0.0 - 0.5 K/uL Final   Basophils Relative 06/09/2022 1  % Final   Basophils Absolute 06/09/2022 0.1  0.0 - 0.1 K/uL Final   Immature Granulocytes 06/09/2022 0  % Final   Abs Immature Granulocytes 06/09/2022 0.03  0.00 - 0.07 K/uL Final   Performed at [facility], [address] [place], [place] [address]   Sodium 06/09/2022 141  135 - 145 mmol/L Final   Potassium 06/09/2022 3.8  3.5 - 5.1 mmol/L Final   Chloride 06/09/2022 104  98 - 111 mmol/L Final   CO2 06/09/2022 27  22 - 32 mmol/L Final   Glucose, Bld 06/09/2022 77  70 - 99 mg/dL Final   Glucose reference range applies only to samples taken after fasting for at least 8 hours.   BUN 06/09/2022 8  6 - 20 mg/dL Final   Creatinine, Ser 06/09/2022 0.78  0.61 - 1.24 mg/dL Final   Calcium 30/86/578407/12/2021 9.7  8.9 - 10.3 mg/dL Final   Total Protein 69/62/952807/12/2021 6.9  6.5 - 8.1 g/dL Final   Albumin 41/32/440107/12/2021 4.2  3.5 - 5.0 g/dL Final   AST 02/72/536607/12/2021 48 (H)  15 - 41 U/L Final   ALT 06/09/2022 32  0 - 44 U/L Final   Alkaline Phosphatase 06/09/2022 75  38 - 126 U/L Final   Total Bilirubin 06/09/2022 0.9  0.3 - 1.2 mg/dL Final   GFR, Estimated 06/09/2022 >60  >60 mL/min Final   Comment: (NOTE) Calculated using the CKD-EPI Creatinine Equation (2021)    Anion gap 06/09/2022 10  5 - 15 Final   Performed at [facility], [address] [place], [place] [address]  Admission on 05/28/2022, Discharged on 05/29/2022  Component Date Value Ref Range Status  SARS Coronavirus 2 by RT PCR 05/28/2022 NEGATIVE  NEGATIVE Final   Comment: (NOTE) SARS-CoV-2 target nucleic acids are NOT DETECTED.  The SARS-CoV-2 RNA is generally detectable in upper respiratory specimens during the acute phase of infection. The lowest concentration of SARS-CoV-2 viral copies this assay can  detect is 138 copies/mL. A negative result does not preclude SARS-Cov-2 infection and should not be used as the sole basis for treatment or other patient management decisions. A negative result may occur with  improper specimen collection/handling, submission of specimen other than nasopharyngeal swab, presence of viral mutation(s) within the areas targeted by this assay, and inadequate number of viral copies(<138 copies/mL). A negative result must be combined with clinical observations, patient history, and epidemiological information. The expected result is Negative.  Fact Sheet for Patients:  BloggerCourse.com  Fact Sheet for Healthcare Providers:  SeriousBroker.it  This test is no                          t yet approved or cleared by the [place] FDA and  has been authorized for detection and/or diagnosis of SARS-CoV-2 by FDA under an Emergency Use Authorization (EUA). This EUA will remain  in effect (meaning this test can be used) for the duration of the COVID-19 declaration under Section 564(b)(1) of the Act, 21 U.S.C.section 360bbb-3(b)(1), unless the authorization is terminated  or revoked sooner.       Influenza A by PCR 05/28/2022 NEGATIVE  NEGATIVE Final   Influenza B by PCR 05/28/2022 NEGATIVE  NEGATIVE Final   Comment: (NOTE) The Xpert Xpress SARS-CoV-2/FLU/RSV plus assay is intended as an aid in the diagnosis of influenza from Nasopharyngeal swab specimens and should not be used as a sole basis for treatment. Nasal washings and aspirates are unacceptable for Xpert Xpress SARS-CoV-2/FLU/RSV testing.  Fact Sheet for Patients: BloggerCourse.com  Fact Sheet for Healthcare Providers: SeriousBroker.it  This test is not yet approved or cleared by the [place] FDA and has been authorized for detection and/or diagnosis of SARS-CoV-2 by FDA under an Emergency  Use Authorization (EUA). This EUA will remain in effect (meaning this test can be used) for the duration of the COVID-19 declaration under Section 564(b)(1) of the Act, 21 U.S.C. section 360bbb-3(b)(1), unless the authorization is terminated or revoked.  Performed at [facility], [address] [place], [place] [address]    WBC 05/28/2022 6.2  4.0 - 10.5 K/uL Final   RBC 05/28/2022 5.17  4.22 - 5.81 MIL/uL Final   Hemoglobin 05/28/2022 15.8  13.0 - 17.0 g/dL Final   HCT 77/82/4235 46.8  39.0 - 52.0 % Final   MCV 05/28/2022 90.5  80.0 - 100.0 fL Final   MCH 05/28/2022 30.6  26.0 - 34.0 pg Final   MCHC 05/28/2022 33.8  30.0 - 36.0 g/dL Final   RDW 36/14/4315 12.3  11.5 - 15.5 % Final   Platelets 05/28/2022 185  150 - 400 K/uL Final   nRBC 05/28/2022 0.0  0.0 - 0.2 % Final   Neutrophils Relative % 05/28/2022 54  % Final   Neutro Abs 05/28/2022 3.4  1.7 - 7.7 K/uL Final   Lymphocytes Relative 05/28/2022 32  % Final   Lymphs Abs 05/28/2022 2.0  0.7 - 4.0 K/uL Final   Monocytes Relative 05/28/2022 8  % Final   Monocytes Absolute 05/28/2022 0.5  0.1 - 1.0 K/uL Final   Eosinophils Relative 05/28/2022 4  % Final   Eosinophils Absolute  05/28/2022 0.2  0.0 - 0.5 K/uL Final   Basophils Relative 05/28/2022 1  % Final   Basophils Absolute 05/28/2022 0.1  0.0 - 0.1 K/uL Final   Immature Granulocytes 05/28/2022 1  % Final   Abs Immature Granulocytes 05/28/2022 0.03  0.00 - 0.07 K/uL Final   Performed at [facility], [address] [place], [place] [address]   Sodium 05/28/2022 139  135 - 145 mmol/L Final   Potassium 05/28/2022 4.1  3.5 - 5.1 mmol/L Final   Chloride 05/28/2022 105  98 - 111 mmol/L Final   CO2 05/28/2022 28  22 - 32 mmol/L Final   Glucose, Bld 05/28/2022 84  70 - 99 mg/dL Final   Glucose reference range applies only to samples taken after fasting for at least 8 hours.   BUN 05/28/2022 13  6 - 20 mg/dL Final   Creatinine, Ser 05/28/2022 0.83  0.61 - 1.24 mg/dL  Final   Calcium 19/50/9326 9.5  8.9 - 10.3 mg/dL Final   Total Protein 71/24/5809 6.9  6.5 - 8.1 g/dL Final   Albumin 98/33/8250 4.2  3.5 - 5.0 g/dL Final   AST 53/97/6734 21  15 - 41 U/L Final   ALT 05/28/2022 21  0 - 44 U/L Final   Alkaline Phosphatase 05/28/2022 79  38 - 126 U/L Final   Total Bilirubin 05/28/2022 0.5  0.3 - 1.2 mg/dL Final   GFR, Estimated 05/28/2022 >60  >60 mL/min Final   Comment: (NOTE) Calculated using the CKD-EPI Creatinine Equation (2021)    Anion gap 05/28/2022 6  5 - 15 Final   Performed at [facility], [address], [place], [place] [address]   Hgb A1c MFr Bld 05/28/2022 4.9  4.8 - 5.6 % Final   Comment: (NOTE) Pre diabetes:          5.7%-6.4%  Diabetes:              >6.4%  Glycemic control for   <7.0% adults with diabetes    Mean Plasma Glucose 05/28/2022 93.93  mg/dL Final   Performed at [facility], [address], [place], [place] [address]   Cholesterol 05/28/2022 136  0 - 200 mg/dL Final   Triglycerides 73/53/2992 246 (H)  <150 mg/dL Final   HDL 42/68/3419 46  >40 mg/dL Final   Total CHOL/HDL Ratio 05/28/2022 3.0  RATIO Final   VLDL 05/28/2022 49 (H)  0 - 40 mg/dL Final   LDL Cholesterol 05/28/2022 41  0 - 99 mg/dL Final   Comment:        Total Cholesterol/HDL:CHD Risk Coronary Heart Disease Risk Table                     Men   Women  1/2 Average Risk   3.4   3.3  Average Risk       5.0   4.4  2 X Average Risk   9.6   7.1  3 X Average Risk  23.4   11.0        Use the calculated Patient Ratio above and the CHD Risk Table to determine the patient's CHD Risk.        ATP III CLASSIFICATION (LDL):  <100     mg/dL   Optimal  622-297  mg/dL   Near or Above                    Optimal  130-159  mg/dL   Borderline  101-751  mg/dL   High  >025     mg/dL   Very High Performed at [facility], [address], [place], [place] [address]    POC Amphetamine UR 05/28/2022 None Detected  NONE DETECTED (Cut Off Level 1000  ng/mL) Preliminary   POC Secobarbital (BAR) 05/28/2022 None Detected  NONE DETECTED (Cut Off Level 300 ng/mL) Preliminary   POC Buprenorphine (BUP) 05/28/2022 None Detected  NONE DETECTED (Cut Off Level 10 ng/mL) Preliminary   POC Oxazepam (BZO) 05/28/2022 None Detected  NONE DETECTED (Cut Off Level 300 ng/mL) Preliminary   POC Cocaine UR 05/28/2022 None Detected  NONE DETECTED (Cut Off Level 300 ng/mL) Preliminary   POC Methamphetamine UR 05/28/2022 None Detected  NONE DETECTED (Cut Off Level 1000 ng/mL) Preliminary   POC Morphine 05/28/2022 None Detected  NONE DETECTED (Cut Off Level 300 ng/mL) Preliminary   POC Methadone UR 05/28/2022 None Detected  NONE DETECTED (Cut Off Level 300 ng/mL) Preliminary   POC Oxycodone UR 05/28/2022 None Detected  NONE DETECTED (Cut Off Level 100 ng/mL) Preliminary   POC Marijuana UR 05/28/2022 None Detected  NONE DETECTED (Cut Off Level 50 ng/mL) Preliminary   SARSCOV2ONAVIRUS 2 AG 05/28/2022 NEGATIVE  NEGATIVE Final   Comment: (NOTE) SARS-CoV-2 antigen NOT DETECTED.   Negative results are presumptive.  Negative results do not preclude SARS-CoV-2 infection and should not be used as the sole basis for treatment or other patient management decisions, including infection  control decisions, particularly in the presence of clinical signs and  symptoms consistent with COVID-19, or in those who have been in contact with the virus.  Negative results must be combined with clinical observations, patient history, and epidemiological information. The expected result is Negative.  Fact Sheet for Patients: https://www.jennings-kim.com/  Fact Sheet for Healthcare Providers: https://alexander-rogers.biz/  This test is not yet approved or cleared by the [place] FDA and  has been authorized for detection and/or diagnosis of SARS-CoV-2 by FDA under an Emergency Use Authorization (EUA).  This EUA will remain in effect (meaning this test can  be used) for the duration of  the COV                          ID-19 declaration under Section 564(b)(1) of the Act, 21 U.S.C. section 360bbb-3(b)(1), unless the authorization is terminated or revoked sooner.      Blood Alcohol level:  Lab Results  Component Value Date   ETH <10 11/08/2021   ETH <10 08/07/2019    Metabolic Disorder Labs: Lab Results  Component Value Date   HGBA1C 4.9 05/28/2022   MPG 93.93 05/28/2022   MPG 96.8 11/15/2021   Lab Results  Component Value Date   PROLACTIN 10.0 08/24/2018   Lab Results  Component Value Date   CHOL 136 05/28/2022   TRIG 246 (H) 05/28/2022   HDL 46 05/28/2022   CHOLHDL 3.0 05/28/2022   VLDL 49 (H) 05/28/2022   LDLCALC 41 05/28/2022   LDLCALC 41 11/15/2021    Therapeutic Lab Levels: No results found for: "LITHIUM" No results found for: "VALPROATE" No results found for: "CBMZ"  Physical Findings   AIMS    Flowsheet Row [facility]  AIMS Total Score 0 0      AUDIT    Flowsheet Row Ad[facility] (Dis[facility]  Alcohol Use Disorder Identification Test Final Score (AUDIT) 0      PHQ2-9    Flowsheet Row Office [facility] from 11/27/2019 in [facility] Visit from 08/04/2018 in [facility]  PHQ-2 Total Score 0 0      Flowsheet Row ED from 06/09/2022 in [facility] ED from [facility]/28/2022 in [facility] Ad[facility] (Discharged[facility]  C-SSRS RISK CATEGORY No Risk Error: Question 2 not populated No Risk        Musculoskeletal  Strength & Muscle Tone: within normal limits Gait & Station: normal Patient leans: N/A  Psychiatric Specialty  Exam  Presentation  General Appearance: Fairly Groomed  Eye Contact:Good  Speech:Pressured  Speech Volume:Normal  Handedness:Right   Mood and Affect  Mood:Euthymic  Affect:Congruent   Thought Process  Thought Processes:Linear  Descriptions of Associations:Loose  Orientation:Full (Time, Place and Person)  Thought Content:Logical  Diagnosis of Schizophrenia or Schizoaffective disorder in past: Yes  Duration of Psychotic Symptoms: Greater than six months   Hallucinations:Hallucinations: Auditory Description of Auditory Hallucinations: "Voice of God saying comforting things"  Ideas of Reference:None  Suicidal Thoughts:Suicidal Thoughts: Yes, Passive SI Passive Intent and/or Plan: Without Plan; Without Intent  Homicidal Thoughts:Homicidal Thoughts: No   Sensorium  Memory:Immediate Good; Recent Good; Remote Fair  Judgment:Fair  Insight:Fair   Executive Functions  Concentration:Fair  Attention Span:Fair  Recall:Fair  Fund of Knowledge:Good  Language:Good   Psychomotor Activity  Psychomotor Activity:Psychomotor Activity: Normal   Assets  Assets:Desire for Improvement; Manufacturing systems engineer; Financial Resources/Insurance; Housing; Physical Health   Sleep  Sleep:Sleep: Fair Number of Hours of Sleep: 6   Nutritional Assessment (For [facility] and [facility] admissions only) Has the patient had a weight loss or gain of 10 pounds or more in the last 3 months?: No Has the patient had a decrease in food intake/or appetite?: No Does the patient have dental problems?: No Does the patient have eating habits or behaviors that may be indicators of an eating disorder including binging or inducing vomiting?: No Has the patient recently lost weight without trying?: 0 Has the patient been eating poorly because of a decreased appetite?: 0 Malnutrition Screening Tool Score: 0    Physical Exam  Physical Exam Vitals and nursing note reviewed.  Cardiovascular:     Rate  and Rhythm: Normal rate and regular rhythm.  Neurological:     General: No focal deficit present.     Mental Status: He is alert.  Psychiatric:        Mood and Affect: Mood normal.        Thought Content: Thought content normal.    Review of Systems  Psychiatric/Behavioral:  Positive for hallucinations. Negative for depression and suicidal ideas. The patient is nervous/anxious.   All other systems reviewed and are negative.  Blood pressure 136/86, pulse 89, temperature 97.9 F (36.6 C), temperature source Oral, resp. rate 18, SpO2 100 %. There is no height or weight on file to calculate BMI.  Treatment Plan Summary: Daily contact with patient to assess and evaluate symptoms and progress in treatment and Medication management  Patient was restarted on Invega 6 mg nightly Continue trazodone 50 mg as needed Continue Cogentin 1 mg nightly  CSW to continue seeking inpatient admission for mood stabilization  Oneta Rack, NP 06/10/2022 4:11 PM

## 2022-06-10 NOTE — Progress Notes (Signed)
Per Hillery Jacks, NP, via secure chat, patient meets criteria for inpatient treatment. There are no available beds at [facility] today. CSW faxed referrals to the following facilities for review:  [facility]  Pending - Request Sent N/A [address], [place] [address] [phone] --  [facility]  Pending - Request Sent N/A [address] [place] [address] [phone] --  [facility]  Pending - Request Sent N/A [address] [place] [address] [phone] --  [facility]  Pending - Request Sent N/A [facility] [address] [place] [address] [phone] --  [facility]  Pending - Request Sent N/A [address] [place] [address] [phone] --  [facility]  Pending - Request Sent N/A [address] [place] [address] [phone] --  [facility]  Pending - Request Sent N/A [address] [place], [place] [address] [phone] --  [facility]  Pending - Request Sent N/A [address] [place] [address] [phone] --  [facility]  Pending - Request Sent N/A [address] [place] [address] [phone] --  [facility]  Pending - Request Sent N/A [address], [place] [address] [phone] --  [facility]  Pending - Request Sent N/A [address] [place] [address] [phone] --  [facility]  Pending - Request Sent N/A [address], [place] [address] [phone] --  [facility]  Pending - Request Sent N/A [address] [place] [address] [phone] --  [facility]  Pending - Request Sent  N/A [address] [place] [address] [phone] --  [facility]  Pending - Request Sent N/A [address] [place] [address] [phone] --  [facility]  Pending - Request Sent N/A [address] [place] [address] [phone] --   TTS will continue to seek bed placement.  Crissie Reese, MSW, Lenice Pressman Phone: [phone] Disposition/TOC

## 2022-06-11 ENCOUNTER — Inpatient Hospital Stay (HOSPITAL_COMMUNITY)
Admission: AD | Admit: 2022-06-11 | Discharge: 2022-06-20 | DRG: 885 | Disposition: A | Discharge status: Home / Self Care | Payer: 59 | Source: Intra-hospital | Attending: Psychiatry | Admitting: Psychiatry

## 2022-06-11 ENCOUNTER — Encounter (HOSPITAL_COMMUNITY): Payer: Self-pay | Admitting: Student

## 2022-06-11 ENCOUNTER — Other Ambulatory Visit: Payer: Self-pay

## 2022-06-11 DIAGNOSIS — Z789 Other specified health status: Secondary | ICD-10-CM

## 2022-06-11 DIAGNOSIS — Z91148 Patient's other noncompliance with medication regimen for other reason: Secondary | ICD-10-CM

## 2022-06-11 DIAGNOSIS — Z046 Encounter for general psychiatric examination, requested by authority: Secondary | ICD-10-CM

## 2022-06-11 DIAGNOSIS — F22 Delusional disorders: Secondary | ICD-10-CM | POA: Diagnosis present

## 2022-06-11 DIAGNOSIS — R45851 Suicidal ideations: Secondary | ICD-10-CM | POA: Diagnosis present

## 2022-06-11 DIAGNOSIS — F312 Bipolar disorder, current episode manic severe with psychotic features: Secondary | ICD-10-CM

## 2022-06-11 DIAGNOSIS — R682 Dry mouth, unspecified: Secondary | ICD-10-CM | POA: Diagnosis present

## 2022-06-11 DIAGNOSIS — F909 Attention-deficit hyperactivity disorder, unspecified type: Secondary | ICD-10-CM | POA: Diagnosis present

## 2022-06-11 DIAGNOSIS — Z62810 Personal history of physical and sexual abuse in childhood: Secondary | ICD-10-CM | POA: Diagnosis present

## 2022-06-11 DIAGNOSIS — Z91411 Personal history of adult psychological abuse: Secondary | ICD-10-CM | POA: Diagnosis not present

## 2022-06-11 DIAGNOSIS — G4700 Insomnia, unspecified: Secondary | ICD-10-CM

## 2022-06-11 DIAGNOSIS — Z79899 Other long term (current) drug therapy: Secondary | ICD-10-CM | POA: Diagnosis not present

## 2022-06-11 DIAGNOSIS — Z20822 Contact with and (suspected) exposure to covid-19: Secondary | ICD-10-CM | POA: Diagnosis present

## 2022-06-11 DIAGNOSIS — F411 Generalized anxiety disorder: Secondary | ICD-10-CM | POA: Diagnosis present

## 2022-06-11 DIAGNOSIS — F319 Bipolar disorder, unspecified: Secondary | ICD-10-CM | POA: Diagnosis present

## 2022-06-11 DIAGNOSIS — Z91410 Personal history of adult physical and sexual abuse: Secondary | ICD-10-CM

## 2022-06-11 DIAGNOSIS — F1090 Alcohol use, unspecified, uncomplicated: Secondary | ICD-10-CM

## 2022-06-11 LAB — POCT URINE DRUG SCREEN - MANUAL ENTRY (I-SCREEN)
POC Amphetamine UR: NOT DETECTED
POC Buprenorphine (BUP): NOT DETECTED
POC Cocaine UR: NOT DETECTED
POC Marijuana UR: NOT DETECTED
POC Methadone UR: NOT DETECTED
POC Methamphetamine UR: NOT DETECTED
POC Morphine: NOT DETECTED
POC Oxazepam (BZO): NOT DETECTED
POC Oxycodone UR: NOT DETECTED
POC Secobarbital (BAR): NOT DETECTED

## 2022-06-11 MED ORDER — BENZTROPINE MESYLATE 1 MG PO TABS
1.0000 mg | ORAL_TABLET | Freq: Two times a day (BID) | ORAL | Status: DC
  Administered 2022-06-11: 1 mg via ORAL

## 2022-06-11 MED ORDER — BENZTROPINE MESYLATE 1 MG PO TABS: 1.0000 mg | ORAL_TABLET | Freq: Two times a day (BID) | ORAL | Status: DC

## 2022-06-11 MED ORDER — HYDROXYZINE HCL 25 MG PO TABS: 50.0000 mg | ORAL_TABLET | ORAL | Status: DC | PRN

## 2022-06-11 MED ORDER — BENZTROPINE MESYLATE 1 MG PO TABS
1.0000 mg | ORAL_TABLET | Freq: Two times a day (BID) | ORAL | Status: DC
  Administered 2022-06-11 – 2022-06-20 (×19): 1 mg via ORAL
  Filled 2022-06-11 (×25): qty 1

## 2022-06-11 MED ORDER — HYDROXYZINE HCL 50 MG PO TABS
50.0000 mg | ORAL_TABLET | ORAL | Status: DC | PRN
  Administered 2022-06-11 – 2022-06-16 (×5): 50 mg via ORAL
  Filled 2022-06-11 (×5): qty 1

## 2022-06-11 MED ORDER — TRAZODONE HCL 50 MG PO TABS: 50.0000 mg | ORAL_TABLET | Freq: Every evening | ORAL | Status: DC | PRN

## 2022-06-11 MED ORDER — TRAZODONE HCL 50 MG PO TABS
50.0000 mg | ORAL_TABLET | Freq: Every evening | ORAL | Status: DC | PRN
Start: 2022-06-11 — End: 2022-06-20
  Administered 2022-06-11 – 2022-06-19 (×9): 50 mg via ORAL
  Filled 2022-06-11 (×9): qty 1

## 2022-06-11 MED ORDER — PALIPERIDONE ER 6 MG PO TB24
6.0000 mg | ORAL_TABLET | Freq: Every day | ORAL | Status: DC
  Administered 2022-06-12: 6 mg via ORAL
  Filled 2022-06-11 (×4): qty 1

## 2022-06-11 MED ORDER — MAGNESIUM HYDROXIDE 400 MG/5ML PO SUSP
30.0000 mL | Freq: Every day | ORAL | Status: DC | PRN
Start: 2022-06-11 — End: 2022-06-20

## 2022-06-11 MED ORDER — ALUM & MAG HYDROXIDE-SIMETH 200-200-20 MG/5ML PO SUSP: 30.0000 mL | ORAL | Status: DC | PRN

## 2022-06-11 MED ORDER — ACETAMINOPHEN 325 MG PO TABS: 650.0000 mg | ORAL_TABLET | Freq: Four times a day (QID) | ORAL | Status: DC | PRN

## 2022-06-11 MED ORDER — PALIPERIDONE ER 6 MG PO TB24: 6.0000 mg | ORAL_TABLET | Freq: Every day | ORAL | Status: DC

## 2022-06-11 NOTE — ED Provider Notes (Incomplete)
[facility]/OBS ASAP Discharge Summary  Date and Time: 06/11/2022 12:46 PM  Name: Mario Thomas  MRN:  [id number]   Discharge Diagnoses:  Final diagnoses:  Bipolar 1 disorder (HCC)  Involuntary commitment    Subjective: ***  Stay Summary: ***  Total Time spent with patient: {Time; 15 min - 8 hours:17441}  Past Psychiatric History: *** Past Medical History:  Past Medical History:  Diagnosis Date   Heart murmur    MDD (major depressive disorder), severe (HCC) 08/23/2018   No past surgical history on file. Family History:  Family History  Problem Relation Age of Onset   Hyperlipidemia Father    Family Psychiatric History: *** Social History:  Social History   Substance and Sexual Activity  Alcohol Use Never     Social History   Substance and Sexual Activity  Drug Use Never    Social History   Socioeconomic History   Marital status: Single    Spouse name: Not on file   Number of children: Not on file   Years of education: Not on file   Highest education level: Not on file  Occupational History   Not on file  Tobacco Use   Smoking status: Never   Smokeless tobacco: Never  Vaping Use   Vaping Use: Never used  Substance and Sexual Activity   Alcohol use: Never   Drug use: Never   Sexual activity: Never  Other Topics Concern   Not on file  Social History Narrative   01/23/21   From: the area   Living: with parents   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, 1 sibling      Enjoys: running      Exercise: daily running    Diet: meat, veggies, grains      Safety   Seat belts: Yes    Guns: No   Safe in relationships: Yes    Social Determinants of Corporate investment banker Strain: Not on file  Food Insecurity: Not on file  Transportation Needs: Not on file  Physical Activity: Not on file  Stress: Not on file  Social Connections: Not on file   SDOH:  SDOH Screenings   Alcohol Screen: Low Risk   (11/09/2021)   Alcohol Screen    Last Alcohol Screening Score (AUDIT): 0  Depression (PHQ2-9): Low Risk  (11/27/2019)   Depression (PHQ2-9)    PHQ-2 Score: 0  Financial Resource Strain: Not on file  Food Insecurity: Not on file  Housing: Not on file  Physical Activity: Not on file  Social Connections: Not on file  Stress: Not on file  Tobacco Use: Low Risk  (11/09/2021)   Patient History    Smoking Tobacco Use: Never    Smokeless Tobacco Use: Never    Passive Exposure: Not on file  Transportation Needs: Not on file    Tobacco Cessation:  {Discharge tobacco cessation prescription:304700209}  Current Medications:  Current Facility-Administered Medications  Medication Dose Route Frequency Provider Last Rate Last Admin   acetaminophen (TYLENOL) tablet 650 mg  650 mg Oral Q6H PRN Ajibola, Ene A, NP       alum & mag hydroxide-simeth (MAALOX/MYLANTA) 200-200-20 MG/5ML suspension 30 mL  30 mL Oral Q4H PRN Ajibola, Ene A, NP       benztropine (COGENTIN) tablet 1 mg  1 mg Oral BID Princess Bruins, DO   1 mg at 06/11/22 1056   hydrOXYzine (ATARAX) tablet 50  mg  50 mg Oral Q4H PRN Princess Bruins, DO       magnesium hydroxide (MILK OF MAGNESIA) suspension 30 mL  30 mL Oral Daily PRN Ajibola, Ene A, NP       paliperidone (INVEGA) 24 hr tablet 6 mg  6 mg Oral Daily Ajibola, Ene A, NP   6 mg at 06/11/22 0932   traZODone (DESYREL) tablet 50 mg  50 mg Oral QHS PRN Princess Bruins, DO       Current Outpatient Medications  Medication Sig Dispense Refill   benztropine (COGENTIN) 1 MG tablet Take 1 tablet (1 mg total) by mouth 2 (two) times daily.     [START ON 06/12/2022] paliperidone (INVEGA) 6 MG 24 hr tablet Take 1 tablet (6 mg total) by mouth daily.      PTA Medications: (Not in a hospital admission)      11/27/2019    9:41 AM 08/04/2018    9:03 AM  Depression screen PHQ 2/9  Decreased Interest 0 0  Down, Depressed, Hopeless 0 0  PHQ - 2 Score 0 0    Flowsheet Row ED from 06/09/2022 in  [facility] from 05/28/2022 in [facility]Dis[facility]  C-SSRS RISK CATEGORY Error: Question 2 not populated Error: Question 2 not populated No Risk       Musculoskeletal  Strength & Muscle Tone: {desc; muscle tone:32375} Gait & Station: {PE GAIT ED ZOXW:96045} Patient leans: {Patient Leans:21022755}  Psychiatric Specialty Exam  Presentation  General Appearance: Fairly Groomed; Casual (Pleseant and cooperative)  Eye Contact:Fair (Minimal blinking)  Speech:Other (comment) (Moderately pressured, but interruptable. Mildly rapid speech)  Speech Volume:Normal  Handedness:Right   Mood and Affect  Mood:Euphoric ("Great")  Affect:Blunt; Congruent   Thought Process  Thought Processes:Irrevelant; Disorganized (Will directly answer short close ended questions)  Descriptions of Associations:Tangential  Orientation:Full (Time, Place and Person)  Thought Content:Illogical; Delusions; Perseveration; Rumination; Scattered; Tangential (Hyper-religious delusions, unable to reality test. Feeling of "peace" that the "pain will end soon".)  Diagnosis of Schizophrenia or Schizoaffective disorder in past: Yes  Duration of Psychotic Symptoms: Greater than six months   Hallucinations:Hallucinations: Auditory Description of Auditory Hallucinations: Voice of god comforting him  Ideas of Reference:Delusions (indirect mesages from god)  Suicidal Thoughts:Suicidal Thoughts: Yes, Passive ("pain will end soon when I go through the process" unable to get concrete answer. Denied plan) SI Passive Intent and/or Plan: -- (per above)  Homicidal Thoughts:Homicidal Thoughts: No   Sensorium  Memory:Immediate Fair  Judgment:Fair (compliant with scheduled meds)  Insight:None   Executive Functions  Concentration:Good  Attention Span:Good  Recall:Good  Fund of  Knowledge:Good  Language:Good   Psychomotor Activity  Psychomotor Activity:Psychomotor Activity: Restlessness   Assets  Assets:Communication Skills; Desire for Improvement; Social Support   Sleep  Sleep:Sleep: Poor   No data recorded  Physical Exam  Physical Exam ROS Blood pressure 129/69, pulse 79, temperature 98.4 F (36.9 C), resp. rate 18, SpO2 100 %. There is no height or weight on file to calculate BMI.  Demographic Factors:  {Demographic Factors:20662}  Loss Factors: {Loss Factors:20659}  Historical Factors: {Historical Factors:20660}  Risk Reduction Factors:   {Risk Reduction Factors:20661}  Continued Clinical Symptoms:  {Clinical Factors:22706}  Cognitive Features That Contribute To Risk:  {chl bhh Cognitive Features:304700251}    Suicide Risk:  {BHH SUICIDE WUJW:11914}  Plan Of Care/Follow-up recommendations:  {[facility] DC FU RECOMMENDATIONS:22620}  Disposition: ***  Princess Bruins,  DO 06/11/2022, 12:46 PM

## 2022-06-11 NOTE — Group Note (Signed)
LCSW Group Therapy Note  Group Date: 06/11/2022 Start Time: 1300 End Time: 1400   Type of Therapy and Topic:  Group Therapy: Anger Cues and Responses  Participation Level:  Did Not Attend   Description of Group:   In this group, patients learned how to recognize the physical, cognitive, emotional, and behavioral responses they have to anger-provoking situations.  They identified a recent time they became angry and how they reacted.  They analyzed how their reaction was possibly beneficial and how it was possibly unhelpful.  The group discussed a variety of healthier coping skills that could help with such a situation in the future.  Focus was placed on how helpful it is to recognize the underlying emotions to our anger, because working on those can lead to a more permanent solution as well as our ability to focus on the important rather than the urgent.  Therapeutic Goals: Patients will remember their last incident of anger and how they felt emotionally and physically, what their thoughts were at the time, and how they behaved. Patients will identify how their behavior at that time worked for them, as well as how it worked against them. Patients will explore possible new behaviors to use in future anger situations. Patients will learn that anger itself is normal and cannot be eliminated, and that healthier reactions can assist with resolving conflict rather than worsening situations.  Summary of Patient Progress:  Did not attend  Therapeutic Modalities:   Cognitive Behavioral Therapy    Beatris Si, LCSW 06/11/2022  2:07 PM

## 2022-06-11 NOTE — Progress Notes (Signed)
Mario Thomas was pleasant but minimal with conversation.  He did come out for evening wrap up group with good participation.  He denied SI/HI and continues to report auditory hallucinations that are "good voices."  He denied any questions or complaints at this time.  Hydroxyzine and trazodone given with relief.  He is currently resting with his eyes closed and appears to be asleep.  Q 15 minute checks maintained for safety.   06/11/22 2035  Psych Admission Type (Psych Patients Only)  Admission Status Voluntary  Psychosocial Assessment  Patient Complaints None  Eye Contact Fair  Facial Expression Flat  Affect Flat  Speech Logical/coherent  Interaction Assertive  Motor Activity Other (Comment) (unremarkable)  Appearance/Hygiene Unremarkable;In scrubs  Behavior Characteristics Cooperative;Appropriate to situation  Mood Pleasant  Thought Process  Coherency WDL  Content WDL  Delusions None reported or observed  Perception WDL  Hallucination Auditory  Judgment Limited  Confusion WDL  Danger to Self  Current suicidal ideation? Denies  Danger to Others  Danger to Others None reported or observed

## 2022-06-11 NOTE — Plan of Care (Signed)
  Problem: Education: Goal: Emotional status will improve Outcome: Progressing Goal: Mental status will improve Outcome: Progressing   

## 2022-06-11 NOTE — Medical Student Note (Cosign Needed)
Brief Update Note (7/3):  - pt reported to have EPS by nursing staff at 10:22  HPI: - on pt assessment, 0 on aims score - Reports tightening in hips, spasms in thigh, feeling restless - states symptoms like these have happened to him before on antipsychotics - walks with mild limp  A/P: Concern for EPS on current dosing of 6 mg Paliperidone nightly, 1 mg Cogentin nightly - ordered morning-dose of 1mg  Cogentin, scheduled BID - consider decreasing dose of Paliperidone or cross-tapering to Seroquel

## 2022-06-11 NOTE — Progress Notes (Signed)
Admission Note:   Patient is a 21 yr male who presents IVC in no acute distress for the treatment of non compliance with medication and bizarre behavior.  Pt was calm and cooperative with admission process. Patient contracts for safety upon admission. Pt denies V/H, however stated that he hears " comforting voices" Patient stated that he stop taking his medications because of the side effects; he say that the medications caused a cramping pain in his bi-lateral leg and thigh area. Patient was unable to identify which medication he took that caused the side effects.  Patient also stated that he is here because his parents are concerned about his wellbeing. He admitted that he was in fact trying to take the vehicle out of the garage, but it was for the purpose trying to go to work.   Skin was assessed and found to be clear of any abnormal marks PT searched and no contraband found, POC and unit policies explained and understanding verbalized. Consents obtained. Food and fluids offered, and fluids accepted. Pt had no additional questions or concerns.

## 2022-06-11 NOTE — ED Provider Notes (Cosign Needed)
Behavioral Health Progress Note  Date and Time: 06/11/2022 9:34 AM Name: Mario Thomas MRN:  [id number]  Subjective:  Mario Thomas is a 21 year old male that presents under involuntary commitment due to noncompliance with medication and a charted history of bipolar 1 disorder, schizophrenia, major depressive disorder, and generalized anxiety disorder.  Received prn Hydroxyzine and Trazodone overnight.  Upon interview this morning, patient is awake and sitting up in bed, energetically engaging interviewers with rapid, mildly pressured speech. He states he feels good and is at peace. "Everything happens for a reason". States that if he endorsed HI, it was all in self-defense. If he had SI, it was all to end pain.   Pt feels that he is getting messages from god everyday. States they are coming from everywhere, not any one specific place like the weather or television. Pt also reports hearing voices of god that are positive and encouraging.  Denies current SI, HI, demand hallucinations, and visual hallucinations.  Diagnosis:  Final diagnoses:  Bipolar 1 disorder ([place])    Total Time spent with patient: 15 minutes  Past Psychiatric History:  - reports of MDD & Bipolar  Past Medical History:  Past Medical History:  Diagnosis Date   Heart murmur    MDD (major depressive disorder), severe ([place]) 08/23/2018   No past surgical history on file. Family History:  Family History  Problem Relation Age of Onset   Hyperlipidemia Father    Family Psychiatric  History:  -Hyperlipidemia  Social History:  Social History   Substance and Sexual Activity  Alcohol Use Never     Social History   Substance and Sexual Activity  Drug Use Never    Social History   Socioeconomic History   Marital status: Single    Spouse name: Not on file   Number of children: Not on file   Years of education: Not on file   Highest education level: Not on file  Occupational History   Not on file  Tobacco Use    Smoking status: Never   Smokeless tobacco: Never  Vaping Use   Vaping Use: Never used  Substance and Sexual Activity   Alcohol use: Never   Drug use: Never   Sexual activity: Never  Other Topics Concern   Not on file  Social History Narrative   01/23/21   From: the area   Living: with parents   Work: Primary school teacher: Estate manager/land agent at [employer]: good relationship with parents, 1 sibling      Enjoys: running      Exercise: daily running    Diet: meat, veggies, grains      Safety   Seat belts: Yes    Guns: No   Safe in relationships: Yes    Social Determinants of Radio broadcast assistant Strain: Not on file  Food Insecurity: Not on file  Transportation Needs: Not on file  Physical Activity: Not on file  Stress: Not on file  Social Connections: Not on file   SDOH:  SDOH Screenings   Alcohol Screen: Low Risk  (11/09/2021)   Alcohol Screen    Last Alcohol Screening Score (AUDIT): 0  Depression (PHQ2-9): Low Risk  (11/27/2019)   Depression (PHQ2-9)    PHQ-2 Score: 0  Financial Resource Strain: Not on file  Food Insecurity: Not on file  Housing: Not on file  Physical Activity: Not on file  Social Connections: Not on file  Stress: Not on file  Tobacco Use: Low Risk  (11/09/2021)   Patient History    Smoking Tobacco Use: Never    Smokeless Tobacco Use: Never    Passive Exposure: Not on file  Transportation Needs: Not on file   Additional Social History:    Pain Medications: See MAR Prescriptions: See MAR Over the Counter: See MAR History of alcohol / drug use?: Yes (Pt reports he recent started drinking liquor but not to intoxication.)                    Sleep: Good recently, though reports waking up several times during the night. States he normally sleeps 8 hours  Appetite:  Good  Current Medications:  Current Facility-Administered Medications  Medication Dose Route Frequency Provider Last Rate Last Admin   acetaminophen  (TYLENOL) tablet 650 mg  650 mg Oral Q6H PRN Ajibola, Ene A, NP       alum & mag hydroxide-simeth (MAALOX/MYLANTA) 200-200-20 MG/5ML suspension 30 mL  30 mL Oral Q4H PRN Ajibola, Ene A, NP       benztropine (COGENTIN) tablet 1 mg  1 mg Oral QHS Ajibola, Ene A, NP   1 mg at 06/10/22 2142   hydrOXYzine (ATARAX) tablet 25 mg  25 mg Oral TID PRN Ajibola, Ene A, NP   25 mg at 06/10/22 2142   magnesium hydroxide (MILK OF MAGNESIA) suspension 30 mL  30 mL Oral Daily PRN Ajibola, Ene A, NP       paliperidone (INVEGA) 24 hr tablet 6 mg  6 mg Oral Daily Ajibola, Ene A, NP   6 mg at 06/10/22 0953   traZODone (DESYREL) tablet 50 mg  50 mg Oral QHS PRN Ajibola, Ene A, NP   50 mg at 06/10/22 2142   No current outpatient medications on file.    Labs  Lab Results:  Admission on 06/09/2022  Component Date Value Ref Range Status   SARS Coronavirus 2 by RT PCR 06/09/2022 NEGATIVE  NEGATIVE Final   Comment: (NOTE) SARS-CoV-2 target nucleic acids are NOT DETECTED.  The SARS-CoV-2 RNA is generally detectable in upper respiratory specimens during the acute phase of infection. The lowest concentration of SARS-CoV-2 viral copies this assay can detect is 138 copies/mL. A negative result does not preclude SARS-Cov-2 infection and should not be used as the sole basis for treatment or other patient management decisions. A negative result may occur with  improper specimen collection/handling, submission of specimen other than nasopharyngeal swab, presence of viral mutation(s) within the areas targeted by this assay, and inadequate number of viral copies(<138 copies/mL). A negative result must be combined with clinical observations, patient history, and epidemiological information. The expected result is Negative.  Fact Sheet for Patients:  BloggerCourse.comhttps://www.fda.gov/media/152166/download  Fact Sheet for Healthcare Providers:  SeriousBroker.ithttps://www.fda.gov/media/152162/download  This test is no                          t yet  approved or cleared by the [place] FDA and  has been authorized for detection and/or diagnosis of SARS-CoV-2 by FDA under an Emergency Use Authorization (EUA). This EUA will remain  in effect (meaning this test can be used) for the duration of the COVID-19 declaration under Section 564(b)(1) of the Act, 21 U.S.C.section 360bbb-3(b)(1), unless the authorization is terminated  or revoked sooner.       Influenza A by PCR 06/09/2022 NEGATIVE  NEGATIVE Final   Influenza B by PCR 06/09/2022 NEGATIVE  NEGATIVE Final  Comment: (NOTE) The Xpert Xpress SARS-CoV-2/FLU/RSV plus assay is intended as an aid in the diagnosis of influenza from Nasopharyngeal swab specimens and should not be used as a sole basis for treatment. Nasal washings and aspirates are unacceptable for Xpert Xpress SARS-CoV-2/FLU/RSV testing.  Fact Sheet for Patients: BloggerCourse.com  Fact Sheet for Healthcare Providers: SeriousBroker.it  This test is not yet approved or cleared by the [place] FDA and has been authorized for detection and/or diagnosis of SARS-CoV-2 by FDA under an Emergency Use Authorization (EUA). This EUA will remain in effect (meaning this test can be used) for the duration of the COVID-19 declaration under Section 564(b)(1) of the Act, 21 U.S.C. section 360bbb-3(b)(1), unless the authorization is terminated or revoked.  Performed at [facility], [address], [place], [place] [address]    WBC 06/09/2022 7.6  4.0 - 10.5 K/uL Final   RBC 06/09/2022 5.15  4.22 - 5.81 MIL/uL Final   Hemoglobin 06/09/2022 15.4  13.0 - 17.0 g/dL Final   HCT 56/31/4970 46.5  39.0 - 52.0 % Final   MCV 06/09/2022 90.3  80.0 - 100.0 fL Final   MCH 06/09/2022 29.9  26.0 - 34.0 pg Final   MCHC 06/09/2022 33.1  30.0 - 36.0 g/dL Final   RDW 26/37/8588 12.7  11.5 - 15.5 % Final   Platelets 06/09/2022 198  150 - 400 K/uL Final   nRBC 06/09/2022 0.0  0.0 -  0.2 % Final   Neutrophils Relative % 06/09/2022 61  % Final   Neutro Abs 06/09/2022 4.7  1.7 - 7.7 K/uL Final   Lymphocytes Relative 06/09/2022 28  % Final   Lymphs Abs 06/09/2022 2.1  0.7 - 4.0 K/uL Final   Monocytes Relative 06/09/2022 7  % Final   Monocytes Absolute 06/09/2022 0.5  0.1 - 1.0 K/uL Final   Eosinophils Relative 06/09/2022 3  % Final   Eosinophils Absolute 06/09/2022 0.2  0.0 - 0.5 K/uL Final   Basophils Relative 06/09/2022 1  % Final   Basophils Absolute 06/09/2022 0.1  0.0 - 0.1 K/uL Final   Immature Granulocytes 06/09/2022 0  % Final   Abs Immature Granulocytes 06/09/2022 0.03  0.00 - 0.07 K/uL Final   Performed at [facility], [address], [place], [place] [address]   Sodium 06/09/2022 141  135 - 145 mmol/L Final   Potassium 06/09/2022 3.8  3.5 - 5.1 mmol/L Final   Chloride 06/09/2022 104  98 - 111 mmol/L Final   CO2 06/09/2022 27  22 - 32 mmol/L Final   Glucose, Bld 06/09/2022 77  70 - 99 mg/dL Final   Glucose reference range applies only to samples taken after fasting for at least 8 hours.   BUN 06/09/2022 8  6 - 20 mg/dL Final   Creatinine, Ser 06/09/2022 0.78  0.61 - 1.24 mg/dL Final   Calcium 41/28/7867 9.7  8.9 - 10.3 mg/dL Final   Total Protein 67/20/9470 6.9  6.5 - 8.1 g/dL Final   Albumin 96/28/3662 4.2  3.5 - 5.0 g/dL Final   AST 94/76/5465 48 (H)  15 - 41 U/L Final   ALT 06/09/2022 32  0 - 44 U/L Final   Alkaline Phosphatase 06/09/2022 75  38 - 126 U/L Final   Total Bilirubin 06/09/2022 0.9  0.3 - 1.2 mg/dL Final   GFR, Estimated 06/09/2022 >60  >60 mL/min Final   Comment: (NOTE) Calculated using the CKD-EPI Creatinine Equation (2021)    Anion gap 06/09/2022 10  5 - 15 Final   Performed at [facility], [address], [place], [place] [address]  Admission on 05/28/2022, Discharged on 05/29/2022  Component Date Value Ref Range Status   SARS Coronavirus 2 by RT PCR 05/28/2022 NEGATIVE  NEGATIVE Final   Comment: (NOTE) SARS-CoV-2  target nucleic acids are NOT DETECTED.  The SARS-CoV-2 RNA is generally detectable in upper respiratory specimens during the acute phase of infection. The lowest concentration of SARS-CoV-2 viral copies this assay can detect is 138 copies/mL. A negative result does not preclude SARS-Cov-2 infection and should not be used as the sole basis for treatment or other patient management decisions. A negative result may occur with  improper specimen collection/handling, submission of specimen other than nasopharyngeal swab, presence of viral mutation(s) within the areas targeted by this assay, and inadequate number of viral copies(<138 copies/mL). A negative result must be combined with clinical observations, patient history, and epidemiological information. The expected result is Negative.  Fact Sheet for Patients:  BloggerCourse.com  Fact Sheet for Healthcare Providers:  SeriousBroker.it  This test is no                          t yet approved or cleared by the [place] FDA and  has been authorized for detection and/or diagnosis of SARS-CoV-2 by FDA under an Emergency Use Authorization (EUA). This EUA will remain  in effect (meaning this test can be used) for the duration of the COVID-19 declaration under Section 564(b)(1) of the Act, 21 U.S.C.section 360bbb-3(b)(1), unless the authorization is terminated  or revoked sooner.       Influenza A by PCR 05/28/2022 NEGATIVE  NEGATIVE Final   Influenza B by PCR 05/28/2022 NEGATIVE  NEGATIVE Final   Comment: (NOTE) The Xpert Xpress SARS-CoV-2/FLU/RSV plus assay is intended as an aid in the diagnosis of influenza from Nasopharyngeal swab specimens and should not be used as a sole basis for treatment. Nasal washings and aspirates are unacceptable for Xpert Xpress SARS-CoV-2/FLU/RSV testing.  Fact Sheet for Patients: BloggerCourse.com  Fact Sheet for Healthcare  Providers: SeriousBroker.it  This test is not yet approved or cleared by the [place] FDA and has been authorized for detection and/or diagnosis of SARS-CoV-2 by FDA under an Emergency Use Authorization (EUA). This EUA will remain in effect (meaning this test can be used) for the duration of the COVID-19 declaration under Section 564(b)(1) of the Act, 21 U.S.C. section 360bbb-3(b)(1), unless the authorization is terminated or revoked.  Performed at [facility], [address], [place], [place] [address]    WBC 05/28/2022 6.2  4.0 - 10.5 K/uL Final   RBC 05/28/2022 5.17  4.22 - 5.81 MIL/uL Final   Hemoglobin 05/28/2022 15.8  13.0 - 17.0 g/dL Final   HCT 52/77/8242 46.8  39.0 - 52.0 % Final   MCV 05/28/2022 90.5  80.0 - 100.0 fL Final   MCH 05/28/2022 30.6  26.0 - 34.0 pg Final   MCHC 05/28/2022 33.8  30.0 - 36.0 g/dL Final   RDW 35/36/1443 12.3  11.5 - 15.5 % Final   Platelets 05/28/2022 185  150 - 400 K/uL Final   nRBC 05/28/2022 0.0  0.0 - 0.2 % Final   Neutrophils Relative % 05/28/2022 54  % Final   Neutro Abs 05/28/2022 3.4  1.7 - 7.7 K/uL Final   Lymphocytes Relative 05/28/2022 32  % Final   Lymphs Abs 05/28/2022 2.0  0.7 - 4.0 K/uL  Final   Monocytes Relative 05/28/2022 8  % Final   Monocytes Absolute 05/28/2022 0.5  0.1 - 1.0 K/uL Final   Eosinophils Relative 05/28/2022 4  % Final   Eosinophils Absolute 05/28/2022 0.2  0.0 - 0.5 K/uL Final   Basophils Relative 05/28/2022 1  % Final   Basophils Absolute 05/28/2022 0.1  0.0 - 0.1 K/uL Final   Immature Granulocytes 05/28/2022 1  % Final   Abs Immature Granulocytes 05/28/2022 0.03  0.00 - 0.07 K/uL Final   Performed at [facility], [address] [place], [place] [address]   Sodium 05/28/2022 139  135 - 145 mmol/L Final   Potassium 05/28/2022 4.1  3.5 - 5.1 mmol/L Final   Chloride 05/28/2022 105  98 - 111 mmol/L Final   CO2 05/28/2022 28  22 - 32 mmol/L Final   Glucose, Bld  05/28/2022 84  70 - 99 mg/dL Final   Glucose reference range applies only to samples taken after fasting for at least 8 hours.   BUN 05/28/2022 13  6 - 20 mg/dL Final   Creatinine, Ser 05/28/2022 0.83  0.61 - 1.24 mg/dL Final   Calcium 60/45/4098 9.5  8.9 - 10.3 mg/dL Final   Total Protein 11/91/4782 6.9  6.5 - 8.1 g/dL Final   Albumin 95/62/1308 4.2  3.5 - 5.0 g/dL Final   AST 65/78/4696 21  15 - 41 U/L Final   ALT 05/28/2022 21  0 - 44 U/L Final   Alkaline Phosphatase 05/28/2022 79  38 - 126 U/L Final   Total Bilirubin 05/28/2022 0.5  0.3 - 1.2 mg/dL Final   GFR, Estimated 05/28/2022 >60  >60 mL/min Final   Comment: (NOTE) Calculated using the CKD-EPI Creatinine Equation (2021)    Anion gap 05/28/2022 6  5 - 15 Final   Performed at [facility], [address], [place], [place] [address]   Hgb A1c MFr Bld 05/28/2022 4.9  4.8 - 5.6 % Final   Comment: (NOTE) Pre diabetes:          5.7%-6.4%  Diabetes:              >6.4%  Glycemic control for   <7.0% adults with diabetes    Mean Plasma Glucose 05/28/2022 93.93  mg/dL Final   Performed at [facility], [address], [place], [place] [address]   Cholesterol 05/28/2022 136  0 - 200 mg/dL Final   Triglycerides 40/09/2724 246 (H)  <150 mg/dL Final   HDL 36/64/4034 46  >40 mg/dL Final   Total CHOL/HDL Ratio 05/28/2022 3.0  RATIO Final   VLDL 05/28/2022 49 (H)  0 - 40 mg/dL Final   LDL Cholesterol 05/28/2022 41  0 - 99 mg/dL Final   Comment:        Total Cholesterol/HDL:CHD Risk Coronary Heart Disease Risk Table                     Men   Women  1/2 Average Risk   3.4   3.3  Average Risk       5.0   4.4  2 X Average Risk   9.6   7.1  3 X Average Risk  23.4   11.0        Use the calculated Patient Ratio above and the CHD Risk Table to determine the patient's CHD Risk.        ATP III CLASSIFICATION (LDL):  <100     mg/dL  Optimal  100-129  mg/dL   Near or Above                    Optimal  130-159  mg/dL    Borderline  160-189  mg/dL   High  >190     mg/dL   Very High Performed at [place] [address] [place], [place] [address]    POC Amphetamine UR 05/28/2022 None Detected  NONE DETECTED (Cut Off Level 1000 ng/mL) Preliminary   POC Secobarbital (BAR) 05/28/2022 None Detected  NONE DETECTED (Cut Off Level 300 ng/mL) Preliminary   POC Buprenorphine (BUP) 05/28/2022 None Detected  NONE DETECTED (Cut Off Level 10 ng/mL) Preliminary   POC Oxazepam (BZO) 05/28/2022 None Detected  NONE DETECTED (Cut Off Level 300 ng/mL) Preliminary   POC Cocaine UR 05/28/2022 None Detected  NONE DETECTED (Cut Off Level 300 ng/mL) Preliminary   POC Methamphetamine UR 05/28/2022 None Detected  NONE DETECTED (Cut Off Level 1000 ng/mL) Preliminary   POC Morphine 05/28/2022 None Detected  NONE DETECTED (Cut Off Level 300 ng/mL) Preliminary   POC Methadone UR 05/28/2022 None Detected  NONE DETECTED (Cut Off Level 300 ng/mL) Preliminary   POC Oxycodone UR 05/28/2022 None Detected  NONE DETECTED (Cut Off Level 100 ng/mL) Preliminary   POC Marijuana UR 05/28/2022 None Detected  NONE DETECTED (Cut Off Level 50 ng/mL) Preliminary   SARSCOV2ONAVIRUS 2 AG 05/28/2022 NEGATIVE  NEGATIVE Final   Comment: (NOTE) SARS-CoV-2 antigen NOT DETECTED.   Negative results are presumptive.  Negative results do not preclude SARS-CoV-2 infection and should not be used as the sole basis for treatment or other patient management decisions, including infection  control decisions, particularly in the presence of clinical signs and  symptoms consistent with COVID-19, or in those who have been in contact with the virus.  Negative results must be combined with clinical observations, patient history, and epidemiological information. The expected result is Negative.  Fact Sheet for Patients: HandmadeRecipes.com.cy  Fact Sheet for Healthcare Providers: FuneralLife.at  This test is not  yet approved or cleared by the [place] FDA and  has been authorized for detection and/or diagnosis of SARS-CoV-2 by FDA under an Emergency Use Authorization (EUA).  This EUA will remain in effect (meaning this test can be used) for the duration of  the COV                          ID-19 declaration under Section 564(b)(1) of the Act, 21 U.S.C. section 360bbb-3(b)(1), unless the authorization is terminated or revoked sooner.      Blood Alcohol level:  Lab Results  Component Value Date   ETH <10 11/08/2021   ETH <10 [id number]    Metabolic Disorder Labs: Lab Results  Component Value Date   HGBA1C 4.9 05/28/2022   MPG 93.93 05/28/2022   MPG 96.8 11/15/2021   Lab Results  Component Value Date   PROLACTIN 10.0 08/24/2018   Lab Results  Component Value Date   CHOL 136 05/28/2022   TRIG 246 (H) 05/28/2022   HDL 46 05/28/2022   CHOLHDL 3.0 05/28/2022   VLDL 49 (H) 05/28/2022   LDLCALC 41 05/28/2022   LDLCALC 41 11/15/2021    Therapeutic Lab Levels: No results found for: "LITHIUM" No results found for: "VALPROATE" No results found for: "CBMZ"  Physical Findings   AIMS    Flowsheet Row Admission (Discharged) from 11/09/2021 in [place] [facility]  Admission (Discharged) from 08/22/2018 in [place] CH[facility]  AIMS Total Score 0 0      AUDIT    Flowsheet Row Admission (Discharged) from 11/09/2021 in [place] [facility]  Alcohol Use Disorder Identification Test Final Score (AUDIT) 0      PHQ2-9    Flowsheet Row Office Visit from 11/27/2019 in [place] [facility] Visit from 08/04/2018 in [place] at [place]  PHQ-2 Total Score 0 0      [facility] from 06/09/2022 in [facility] ED from 05/28/2022 in [facility] Admission (Discharged) from 11/09/2021 in [place] [facility]  C-SSRS RISK CATEGORY No Risk Error: Question 2 not populated No Risk        Musculoskeletal  Strength & Muscle Tone: within normal limits Gait & Station: normal Patient leans: N/A  Psychiatric Specialty Exam  Presentation  General Appearance: Fairly Groomed; Casual  Eye Contact:Fair (Minimal blinking)  Speech:Pressured  Speech Volume:Normal  Handedness:Right   Mood and Affect  Mood:Euthymic  Affect:Congruent   Thought Process  Thought Processes:Linear  Descriptions of Associations:Loose  Orientation:Full (Time, Place and Person)  Thought Content:Logical  Diagnosis of Schizophrenia or Schizoaffective disorder in past: Yes  Duration of Psychotic Symptoms: Greater than six months   Hallucinations:No data recorded Ideas of Reference:None  Suicidal Thoughts:No data recorded Homicidal Thoughts:No data recorded  Sensorium  Memory:Immediate Good; Recent Good; Remote Fair  Judgment:Fair  Insight:Fair   Executive Functions  Concentration:Fair  Attention Span:Fair  [place]  Language:Good   Psychomotor Activity  Psychomotor Activity:No data recorded  Assets  Assets:Desire for Improvement; Armed forces logistics/support/administrative officer; Financial Resources/Insurance; Housing; Physical Health   Sleep  Sleep:No data recorded  No data recorded  Physical Exam  Physical Exam Vitals and nursing note reviewed.  Constitutional:      General: He is not in acute distress.    Appearance: Normal appearance. He is well-developed and normal weight.  HENT:     Head: Normocephalic and atraumatic.  Eyes:     Conjunctiva/sclera: Conjunctivae normal.  Pulmonary:     Effort: Pulmonary effort is normal.  Abdominal:     Palpations: Abdomen is soft.  Musculoskeletal:        General: No swelling.     Cervical back: Normal range of motion and neck supple.  Skin:    General: Skin is warm and dry.  Neurological:     Mental Status: He is alert.     ROS Blood pressure 129/69, pulse 79, temperature 98.4 F (36.9 C), temperature source Oral, resp. rate 18, SpO2 100 %. There is no height or weight on file to calculate BMI.  Treatment Plan Summary: Patient presents today with prolonged period of elevated mood with mildly pressured speech, psychomotor agitation (energetically moving), grandiose delusions (belief that he's getting messages from god), and hearing the voices of god (auditory hallucinations) indicative of a resolving manic episode with psychotic features.   Patient has been being treated with Paliperidone 6 mg daily since 7/1, with reductions in psychotic symptoms, was initially endorsing SI/HI, hearing voices, and responding to internal stimuli. Pt's symptoms appear to be improving. Given Paliperidone takes 1-2 weeks to fully treat psychotic symptoms, I recommend continuing Paliperidone at a dosing of 6 mg per day. Patient continues to be psychotic, so meets criteria for IVC and need for inpatient hospitalization.  - continue Paliperidone 6 mg daily - continue Benztropine  1mg  daily - increase Hydroxyzine prn to 50 mg nightly - hold Trazodone - continue IVC - transfer to inpatient hospital   Cena Benton, Medical Student 06/11/2022 9:34 AM     Cena Benton, Medical Student 06/11/22 1023

## 2022-06-11 NOTE — Progress Notes (Signed)
Adult Psychoeducational Group Note  Date:  06/11/2022 Time:  10:28 PM  Group Topic/Focus:  Wrap-Up Group:   The focus of this group is to help patients review their daily goal of treatment and discuss progress on daily workbooks.  Participation Level:  Active  Participation Quality:  Appropriate  Affect:  Appropriate  Cognitive:  Appropriate  Insight: Appropriate  Engagement in Group:  Developing/Improving  Modes of Intervention:  Discussion  Additional Comments:  Pt stated his goal for today was to focus on his treatment plan. Pt stated he accomplished his goal today. Pt stated he did not get a chance to speak  with his doctor or his social worker about his care today. Pt rated his overall day a 9 out of 10. Pt stated he made no calls today. Pt stated he felt better about himself today. Pt stated he was able to dinner tonight. Pt stated he took all medications provided today.  Pt stated his appetite was pretty good today. Pt rated sleep last night was fair. Pt stated the goal tonight was to get some rest. Pt stated he had some physical pain tonight.  Pt stated he had some mild pain in both his right and left hips. Pt rated his mild pain in his right and left hips a 2 on the pain level scale. Pt nurse was updated on on the situation. Pt deny visual hallucinations and auditory issues tonight. Pt denies thoughts of harming himself or others. Pt stated he would alert staff if anything changed.  Felipa Furnace 06/11/2022, 10:28 PM

## 2022-06-11 NOTE — ED Provider Notes (Addendum)
Behavioral Health Discharge Summary  Date and Time: 06/11/2022 11:18 AM Name: Mario Thomas MRN:  [id number]  Subjective:  Mario Thomas is a 21 year old male that presents under involuntary commitment due to noncompliance with medication and a charted history of bipolar 1 disorder, schizophrenia, major depressive disorder, and generalized anxiety disorder.  Received prn Hydroxyzine and Trazodone overnight.  Upon interview this morning, patient is awake and sitting up in bed, energetically engaging interviewers with rapid, mildly pressured speech. He states he feels good and is at peace. "Everything happens for a reason". States that if he endorsed HI, it was all in self-defense. If he had SI, it was all to end pain.   Pt feels that he is getting messages from god everyday. States they are coming from everywhere, not any one specific place like the weather or television. Pt also reports hearing voices of god that are positive and encouraging.  Denies current SI, HI, demand hallucinations, and visual hallucinations.  Diagnosis:  Final diagnoses:  Bipolar 1 disorder (HCC)  Involuntary commitment    Total Time spent with patient: 15 minutes  Past Psychiatric History:  - reports of MDD & Bipolar  Past Medical History:  Past Medical History:  Diagnosis Date   Heart murmur    MDD (major depressive disorder), severe (HCC) 08/23/2018   No past surgical history on file. Family History:  Family History  Problem Relation Age of Onset   Hyperlipidemia Father    Family Psychiatric  History:  -Hyperlipidemia  Social History:  Social History   Substance and Sexual Activity  Alcohol Use Never     Social History   Substance and Sexual Activity  Drug Use Never    Social History   Socioeconomic History   Marital status: Single    Spouse name: Not on file   Number of children: Not on file   Years of education: Not on file   Highest education level: Not on file  Occupational History   Not  on file  Tobacco Use   Smoking status: Never   Smokeless tobacco: Never  Vaping Use   Vaping Use: Never used  Substance and Sexual Activity   Alcohol use: Never   Drug use: Never   Sexual activity: Never  Other Topics Concern   Not on file  Social History Narrative   01/23/21   From: the area   Living: with parents   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, 1 sibling      Enjoys: running      Exercise: daily running    Diet: meat, veggies, grains      Safety   Seat belts: Yes    Guns: No   Safe in relationships: Yes    Social Determinants of Corporate investment banker Strain: Not on file  Food Insecurity: Not on file  Transportation Needs: Not on file  Physical Activity: Not on file  Stress: Not on file  Social Connections: Not on file   SDOH:  SDOH Screenings   Alcohol Screen: Low Risk  (11/09/2021)   Alcohol Screen    Last Alcohol Screening Score (AUDIT): 0  Depression (PHQ2-9): Low Risk  (11/27/2019)   Depression (PHQ2-9)    PHQ-2 Score: 0  Financial Resource Strain: Not on file  Food Insecurity: Not on file  Housing: Not on file  Physical Activity: Not on file  Social Connections: Not on file  Stress: Not  on file  Tobacco Use: Low Risk  (11/09/2021)   Patient History    Smoking Tobacco Use: Never    Smokeless Tobacco Use: Never    Passive Exposure: Not on file  Transportation Needs: Not on file   Additional Social History:    Pain Medications: See MAR Prescriptions: See MAR Over the Counter: See MAR History of alcohol / drug use?: Yes (Pt reports he recent started drinking liquor but not to intoxication.)                    Sleep: Good recently, though reports waking up several times during the night. States he normally sleeps 8 hours  Appetite:  Good  Current Medications:  Current Facility-Administered Medications  Medication Dose Route Frequency Provider Last Rate Last  Admin   acetaminophen (TYLENOL) tablet 650 mg  650 mg Oral Q6H PRN Ajibola, Ene A, NP       alum & mag hydroxide-simeth (MAALOX/MYLANTA) 200-200-20 MG/5ML suspension 30 mL  30 mL Oral Q4H PRN Ajibola, Ene A, NP       benztropine (COGENTIN) tablet 1 mg  1 mg Oral BID Princess Bruins, DO   1 mg at 06/11/22 1056   hydrOXYzine (ATARAX) tablet 50 mg  50 mg Oral Q4H PRN Princess Bruins, DO       magnesium hydroxide (MILK OF MAGNESIA) suspension 30 mL  30 mL Oral Daily PRN Ajibola, Ene A, NP       paliperidone (INVEGA) 24 hr tablet 6 mg  6 mg Oral Daily Ajibola, Ene A, NP   6 mg at 06/11/22 0932   traZODone (DESYREL) tablet 50 mg  50 mg Oral QHS PRN Princess Bruins, DO       Current Outpatient Medications  Medication Sig Dispense Refill   benztropine (COGENTIN) 1 MG tablet Take 1 tablet (1 mg total) by mouth 2 (two) times daily.     [START ON 06/12/2022] paliperidone (INVEGA) 6 MG 24 hr tablet Take 1 tablet (6 mg total) by mouth daily.      Labs  Lab Results:  Admission on 06/09/2022  Component Date Value Ref Range Status   SARS Coronavirus 2 by RT PCR 06/09/2022 NEGATIVE  NEGATIVE Final   Comment: (NOTE) SARS-CoV-2 target nucleic acids are NOT DETECTED.  The SARS-CoV-2 RNA is generally detectable in upper respiratory specimens during the acute phase of infection. The lowest concentration of SARS-CoV-2 viral copies this assay can detect is 138 copies/mL. A negative result does not preclude SARS-Cov-2 infection and should not be used as the sole basis for treatment or other patient management decisions. A negative result may occur with  improper specimen collection/handling, submission of specimen other than nasopharyngeal swab, presence of viral mutation(s) within the areas targeted by this assay, and inadequate number of viral copies(<138 copies/mL). A negative result must be combined with clinical observations, patient history, and epidemiological information. The expected result is  Negative.  Fact Sheet for Patients:  BloggerCourse.com  Fact Sheet for Healthcare Providers:  SeriousBroker.it  This test is no                          t yet approved or cleared by the [place] FDA and  has been authorized for detection and/or diagnosis of SARS-CoV-2 by FDA under an Emergency Use Authorization (EUA). This EUA will remain  in effect (meaning this test can be used) for the duration of the COVID-19 declaration under Section 564(b)(1) of  the Act, 21 U.S.C.section 360bbb-3(b)(1), unless the authorization is terminated  or revoked sooner.       Influenza A by PCR 06/09/2022 NEGATIVE  NEGATIVE Final   Influenza B by PCR 06/09/2022 NEGATIVE  NEGATIVE Final   Comment: (NOTE) The Xpert Xpress SARS-CoV-2/FLU/RSV plus assay is intended as an aid in the diagnosis of influenza from Nasopharyngeal swab specimens and should not be used as a sole basis for treatment. Nasal washings and aspirates are unacceptable for Xpert Xpress SARS-CoV-2/FLU/RSV testing.  Fact Sheet for Patients: BloggerCourse.com  Fact Sheet for Healthcare Providers: SeriousBroker.it  This test is not yet approved or cleared by the [place] FDA and has been authorized for detection and/or diagnosis of SARS-CoV-2 by FDA under an Emergency Use Authorization (EUA). This EUA will remain in effect (meaning this test can be used) for the duration of the COVID-19 declaration under Section 564(b)(1) of the Act, 21 U.S.C. section 360bbb-3(b)(1), unless the authorization is terminated or revoked.  Performed at [facility], [address] [place], [place] [address]    WBC 06/09/2022 7.6  4.0 - 10.5 K/uL Final   RBC 06/09/2022 5.15  4.22 - 5.81 MIL/uL Final   Hemoglobin 06/09/2022 15.4  13.0 - 17.0 g/dL Final   HCT 76/19/5093 46.5  39.0 - 52.0 % Final   MCV 06/09/2022 90.3  80.0 - 100.0 fL Final    MCH 06/09/2022 29.9  26.0 - 34.0 pg Final   MCHC 06/09/2022 33.1  30.0 - 36.0 g/dL Final   RDW 26/71/2458 12.7  11.5 - 15.5 % Final   Platelets 06/09/2022 198  150 - 400 K/uL Final   nRBC 06/09/2022 0.0  0.0 - 0.2 % Final   Neutrophils Relative % 06/09/2022 61  % Final   Neutro Abs 06/09/2022 4.7  1.7 - 7.7 K/uL Final   Lymphocytes Relative 06/09/2022 28  % Final   Lymphs Abs 06/09/2022 2.1  0.7 - 4.0 K/uL Final   Monocytes Relative 06/09/2022 7  % Final   Monocytes Absolute 06/09/2022 0.5  0.1 - 1.0 K/uL Final   Eosinophils Relative 06/09/2022 3  % Final   Eosinophils Absolute 06/09/2022 0.2  0.0 - 0.5 K/uL Final   Basophils Relative 06/09/2022 1  % Final   Basophils Absolute 06/09/2022 0.1  0.0 - 0.1 K/uL Final   Immature Granulocytes 06/09/2022 0  % Final   Abs Immature Granulocytes 06/09/2022 0.03  0.00 - 0.07 K/uL Final   Performed at [facility], [address] [place], [place] [address]   Sodium 06/09/2022 141  135 - 145 mmol/L Final   Potassium 06/09/2022 3.8  3.5 - 5.1 mmol/L Final   Chloride 06/09/2022 104  98 - 111 mmol/L Final   CO2 06/09/2022 27  22 - 32 mmol/L Final   Glucose, Bld 06/09/2022 77  70 - 99 mg/dL Final   Glucose reference range applies only to samples taken after fasting for at least 8 hours.   BUN 06/09/2022 8  6 - 20 mg/dL Final   Creatinine, Ser 06/09/2022 0.78  0.61 - 1.24 mg/dL Final   Calcium 38/25/0539 9.7  8.9 - 10.3 mg/dL Final   Total Protein 76/73/4193 6.9  6.5 - 8.1 g/dL Final   Albumin 79/01/4096 4.2  3.5 - 5.0 g/dL Final   AST 35/32/9924 48 (H)  15 - 41 U/L Final   ALT 06/09/2022 32  0 - 44 U/L Final   Alkaline Phosphatase 06/09/2022 75  38 - 126 U/L  Final   Total Bilirubin 06/09/2022 0.9  0.3 - 1.2 mg/dL Final   GFR, Estimated 06/09/2022 >60  >60 mL/min Final   Comment: (NOTE) Calculated using the CKD-EPI Creatinine Equation (2021)    Anion gap 06/09/2022 10  5 - 15 Final   Performed at [facility], [address] [place], [place] [address]  Admission on 05/28/2022, Discharged on 05/29/2022  Component Date Value Ref Range Status   SARS Coronavirus 2 by RT PCR 05/28/2022 NEGATIVE  NEGATIVE Final   Comment: (NOTE) SARS-CoV-2 target nucleic acids are NOT DETECTED.  The SARS-CoV-2 RNA is generally detectable in upper respiratory specimens during the acute phase of infection. The lowest concentration of SARS-CoV-2 viral copies this assay can detect is 138 copies/mL. A negative result does not preclude SARS-Cov-2 infection and should not be used as the sole basis for treatment or other patient management decisions. A negative result may occur with  improper specimen collection/handling, submission of specimen other than nasopharyngeal swab, presence of viral mutation(s) within the areas targeted by this assay, and inadequate number of viral copies(<138 copies/mL). A negative result must be combined with clinical observations, patient history, and epidemiological information. The expected result is Negative.  Fact Sheet for Patients:  BloggerCourse.comhttps://www.fda.gov/media/152166/download  Fact Sheet for Healthcare Providers:  SeriousBroker.ithttps://www.fda.gov/media/152162/download  This test is no                          t yet approved or cleared by the [place] FDA and  has been authorized for detection and/or diagnosis of SARS-CoV-2 by FDA under an Emergency Use Authorization (EUA). This EUA will remain  in effect (meaning this test can be used) for the duration of the COVID-19 declaration under Section 564(b)(1) of the Act, 21 U.S.C.section 360bbb-3(b)(1), unless the authorization is terminated  or revoked sooner.       Influenza A by PCR 05/28/2022 NEGATIVE  NEGATIVE Final   Influenza B by PCR 05/28/2022 NEGATIVE  NEGATIVE Final   Comment: (NOTE) The Xpert Xpress SARS-CoV-2/FLU/RSV plus assay is intended as an aid in the diagnosis of influenza from Nasopharyngeal swab specimens and should not be used as a  sole basis for treatment. Nasal washings and aspirates are unacceptable for Xpert Xpress SARS-CoV-2/FLU/RSV testing.  Fact Sheet for Patients: BloggerCourse.comhttps://www.fda.gov/media/152166/download  Fact Sheet for Healthcare Providers: SeriousBroker.ithttps://www.fda.gov/media/152162/download  This test is not yet approved or cleared by the [place] FDA and has been authorized for detection and/or diagnosis of SARS-CoV-2 by FDA under an Emergency Use Authorization (EUA). This EUA will remain in effect (meaning this test can be used) for the duration of the COVID-19 declaration under Section 564(b)(1) of the Act, 21 U.S.C. section 360bbb-3(b)(1), unless the authorization is terminated or revoked.  Performed at [facility], [address] [place], [place] [address]    WBC 05/28/2022 6.2  4.0 - 10.5 K/uL Final   RBC 05/28/2022 5.17  4.22 - 5.81 MIL/uL Final   Hemoglobin 05/28/2022 15.8  13.0 - 17.0 g/dL Final   HCT 09/81/191406/19/2023 46.8  39.0 - 52.0 % Final   MCV 05/28/2022 90.5  80.0 - 100.0 fL Final   MCH 05/28/2022 30.6  26.0 - 34.0 pg Final   MCHC 05/28/2022 33.8  30.0 - 36.0 g/dL Final   RDW 78/29/562106/19/2023 12.3  11.5 - 15.5 % Final   Platelets 05/28/2022 185  150 - 400 K/uL Final   nRBC 05/28/2022 0.0  0.0 - 0.2 % Final  Neutrophils Relative % 05/28/2022 54  % Final   Neutro Abs 05/28/2022 3.4  1.7 - 7.7 K/uL Final   Lymphocytes Relative 05/28/2022 32  % Final   Lymphs Abs 05/28/2022 2.0  0.7 - 4.0 K/uL Final   Monocytes Relative 05/28/2022 8  % Final   Monocytes Absolute 05/28/2022 0.5  0.1 - 1.0 K/uL Final   Eosinophils Relative 05/28/2022 4  % Final   Eosinophils Absolute 05/28/2022 0.2  0.0 - 0.5 K/uL Final   Basophils Relative 05/28/2022 1  % Final   Basophils Absolute 05/28/2022 0.1  0.0 - 0.1 K/uL Final   Immature Granulocytes 05/28/2022 1  % Final   Abs Immature Granulocytes 05/28/2022 0.03  0.00 - 0.07 K/uL Final   Performed at [facility], [address], [place], [place]  [address]   Sodium 05/28/2022 139  135 - 145 mmol/L Final   Potassium 05/28/2022 4.1  3.5 - 5.1 mmol/L Final   Chloride 05/28/2022 105  98 - 111 mmol/L Final   CO2 05/28/2022 28  22 - 32 mmol/L Final   Glucose, Bld 05/28/2022 84  70 - 99 mg/dL Final   Glucose reference range applies only to samples taken after fasting for at least 8 hours.   BUN 05/28/2022 13  6 - 20 mg/dL Final   Creatinine, Ser 05/28/2022 0.83  0.61 - 1.24 mg/dL Final   Calcium 98/10/9146 9.5  8.9 - 10.3 mg/dL Final   Total Protein 82/95/6213 6.9  6.5 - 8.1 g/dL Final   Albumin 08/65/7846 4.2  3.5 - 5.0 g/dL Final   AST 96/29/5284 21  15 - 41 U/L Final   ALT 05/28/2022 21  0 - 44 U/L Final   Alkaline Phosphatase 05/28/2022 79  38 - 126 U/L Final   Total Bilirubin 05/28/2022 0.5  0.3 - 1.2 mg/dL Final   GFR, Estimated 05/28/2022 >60  >60 mL/min Final   Comment: (NOTE) Calculated using the CKD-EPI Creatinine Equation (2021)    Anion gap 05/28/2022 6  5 - 15 Final   Performed at [facility], [address], [place], [place] [address]   Hgb A1c MFr Bld 05/28/2022 4.9  4.8 - 5.6 % Final   Comment: (NOTE) Pre diabetes:          5.7%-6.4%  Diabetes:              >6.4%  Glycemic control for   <7.0% adults with diabetes    Mean Plasma Glucose 05/28/2022 93.93  mg/dL Final   Performed at [facility], [address], [place], [place] [address]   Cholesterol 05/28/2022 136  0 - 200 mg/dL Final   Triglycerides 25/36/6440 246 (H)  <150 mg/dL Final   HDL 34/74/2595 46  >40 mg/dL Final   Total CHOL/HDL Ratio 05/28/2022 3.0  RATIO Final   VLDL 05/28/2022 49 (H)  0 - 40 mg/dL Final   LDL Cholesterol 05/28/2022 41  0 - 99 mg/dL Final   Comment:        Total Cholesterol/HDL:CHD Risk Coronary Heart Disease Risk Table                     Men   Women  1/2 Average Risk   3.4   3.3  Average Risk       5.0   4.4  2 X Average Risk   9.6   7.1  3 X Average Risk  23.4   11.0  Use the calculated Patient  Ratio above and the CHD Risk Table to determine the patient's CHD Risk.        ATP III CLASSIFICATION (LDL):  <100     mg/dL   Optimal  213-086  mg/dL   Near or Above                    Optimal  130-159  mg/dL   Borderline  578-469  mg/dL   High  >629     mg/dL   Very High Performed at [facility], [address], [place], [place] [address]    POC Amphetamine UR 05/28/2022 None Detected  NONE DETECTED (Cut Off Level 1000 ng/mL) Preliminary   POC Secobarbital (BAR) 05/28/2022 None Detected  NONE DETECTED (Cut Off Level 300 ng/mL) Preliminary   POC Buprenorphine (BUP) 05/28/2022 None Detected  NONE DETECTED (Cut Off Level 10 ng/mL) Preliminary   POC Oxazepam (BZO) 05/28/2022 None Detected  NONE DETECTED (Cut Off Level 300 ng/mL) Preliminary   POC Cocaine UR 05/28/2022 None Detected  NONE DETECTED (Cut Off Level 300 ng/mL) Preliminary   POC Methamphetamine UR 05/28/2022 None Detected  NONE DETECTED (Cut Off Level 1000 ng/mL) Preliminary   POC Morphine 05/28/2022 None Detected  NONE DETECTED (Cut Off Level 300 ng/mL) Preliminary   POC Methadone UR 05/28/2022 None Detected  NONE DETECTED (Cut Off Level 300 ng/mL) Preliminary   POC Oxycodone UR 05/28/2022 None Detected  NONE DETECTED (Cut Off Level 100 ng/mL) Preliminary   POC Marijuana UR 05/28/2022 None Detected  NONE DETECTED (Cut Off Level 50 ng/mL) Preliminary   SARSCOV2ONAVIRUS 2 AG 05/28/2022 NEGATIVE  NEGATIVE Final   Comment: (NOTE) SARS-CoV-2 antigen NOT DETECTED.   Negative results are presumptive.  Negative results do not preclude SARS-CoV-2 infection and should not be used as the sole basis for treatment or other patient management decisions, including infection  control decisions, particularly in the presence of clinical signs and  symptoms consistent with COVID-19, or in those who have been in contact with the virus.  Negative results must be combined with clinical observations, patient history, and  epidemiological information. The expected result is Negative.  Fact Sheet for Patients: https://www.jennings-kim.com/  Fact Sheet for Healthcare Providers: https://alexander-rogers.biz/  This test is not yet approved or cleared by the [place] FDA and  has been authorized for detection and/or diagnosis of SARS-CoV-2 by FDA under an Emergency Use Authorization (EUA).  This EUA will remain in effect (meaning this test can be used) for the duration of  the COV                          ID-19 declaration under Section 564(b)(1) of the Act, 21 U.S.C. section 360bbb-3(b)(1), unless the authorization is terminated or revoked sooner.      Blood Alcohol level:  Lab Results  Component Value Date   ETH <10 11/08/2021   ETH <10 08/07/2019    Metabolic Disorder Labs: Lab Results  Component Value Date   HGBA1C 4.9 05/28/2022   MPG 93.93 05/28/2022   MPG 96.8 11/15/2021   Lab Results  Component Value Date   PROLACTIN 10.0 08/24/2018   Lab Results  Component Value Date   CHOL 136 05/28/2022   TRIG 246 (H) 05/28/2022   HDL 46 05/28/2022   CHOLHDL 3.0 05/28/2022   VLDL 49 (H) 05/28/2022   LDLCALC 41 05/28/2022   LDLCALC 41 11/15/2021    Therapeutic Lab Levels:  No results found for: "LITHIUM" No results found for: "VALPROATE" No results found for: "CBMZ"  Physical Findings   AIMS    Flowsheet Row [facility]) [facility]  AIMS Total Score 0 0      AUDIT    Flowsheet Row [facility]  Alcohol Use Disorder Identification Test Final Score (AUDIT) 0      PHQ2-9    Flowsheet Row Office Visit from 11/27/2019 in [facility] Visit from 08/04/2018 in [facility]  PHQ-2  Total Score 0 0      Flowsheet Row ED from 06/09/2022 in [facility] ED from 05/28/2022 in [facility] Ad[facility] (Discharged[facility]  C-SSRS RISK CATEGORY Error: Question 2 not populated Error: Question 2 not populated No Risk        Musculoskeletal  Strength & Muscle Tone: within normal limits Gait & Station: normal Patient leans: N/A  Psychiatric Specialty Exam  Presentation  General Appearance: Fairly Groomed; Casual (Pleseant and cooperative)  Eye Contact:Fair (Minimal blinking)  Speech:Other (comment) (Moderately pressured, but interruptable. Mildly rapid speech)  Speech Volume:Normal  Handedness:Right   Mood and Affect  Mood:Euphoric ("Great")  Affect:Blunt; Congruent   Thought Process  Thought Processes:Irrevelant; Disorganized (Will directly answer short close ended questions)  Descriptions of Associations:Tangential  Orientation:Full (Time, Place and Person)  Thought Content:Illogical; Delusions; Perseveration; Rumination; Scattered; Tangential (Hyper-religious delusions, unable to reality test. Feeling of "peace" that the "pain will end soon".)  Diagnosis of Schizophrenia or Schizoaffective disorder in past: Yes  Duration of Psychotic Symptoms: Greater than six months   Hallucinations:Hallucinations: Auditory Description of Auditory Hallucinations: Voice of god comforting him Ideas of Reference:Delusions (indirect mesages from god)  Suicidal Thoughts:Suicidal Thoughts: Yes, Passive ("pain will end soon when I go through the process" unable to get concrete answer. Denied plan) SI Passive Intent and/or Plan: -- (per above) Homicidal Thoughts:Homicidal Thoughts: No  Sensorium  Memory:Immediate Fair  Judgment:Fair (compliant with scheduled meds)  Insight:None   Executive Functions  Concentration:Good  Attention  Span:Good  Recall:Good  Fund of Knowledge:Good  Language:Good   Psychomotor Activity  Psychomotor Activity:Psychomotor Activity: Restlessness  Assets  Assets:Communication Skills; Desire for Improvement; Social Support   Sleep  Sleep:Sleep: Poor  No data recorded  AIMS 0  Physical Exam  Physical Exam Vitals and nursing note reviewed.  Constitutional:      General: He is not in acute distress.    Appearance: Normal appearance. He is well-developed and normal weight.  HENT:     Head: Normocephalic and atraumatic.  Eyes:     Conjunctiva/sclera: Conjunctivae normal.  Pulmonary:     Effort: Pulmonary effort is normal.  Abdominal:     Palpations: Abdomen is soft.  Musculoskeletal:        General: No swelling.     Cervical back: Normal range of motion and neck supple.  Skin:    General: Skin is warm and dry.  Neurological:     Mental Status: He is alert.    Review of Systems  Respiratory:  Negative for shortness of breath.   Cardiovascular:  Negative for chest pain.  Gastrointestinal:  Negative for nausea and vomiting.  Musculoskeletal:        Muscle tightness and cramping at right hip  Neurological:  Negative for dizziness and headaches.   Blood pressure 129/69, pulse 79, temperature 98.4 F (36.9 C), temperature source Oral, resp. rate 18, SpO2 100 %. There is no height or weight on file to calculate BMI.  Treatment Plan Summary: Patient presents today with prolonged period of elevated mood with mildly pressured speech, psychomotor agitation (energetically moving), grandiose delusions (belief that he's getting messages from god), and hearing the voices of god (auditory hallucinations) indicative of a resolving manic episode with psychotic features.   Patient has been being treated with Paliperidone 6 mg daily since 7/1, with reductions in psychotic symptoms, was initially endorsing SI/HI, hearing voices, and responding to internal stimuli. Pt's symptoms appear  to be improving. Given Paliperidone takes 1-2 weeks to fully treat psychotic symptoms, I recommend continuing Paliperidone at a dosing of 6 mg per day. Patient continues to be psychotic, so meets criteria for IVC and need for inpatient hospitalization.  - continue Paliperidone 6 mg daily - Increased Benztropine 1mg  daily to 1 mg BID  - increase Hydroxyzine prn to 50 mg nightly - hold Trazodone - continue IVC - transfer to inpatient hospital   , Medical Student 06/11/22 1023 ____________________________________ I personally was present and performed or re-performed the history, physical exam and medical decision-making activities of this service and have verified that the service and findings are accurately documented in the student's note.  IVC'd 7/1, first exam completed  Patient still meets IVC and inpatient psych requirements, with sxs of psychosis and delusions per above.  He denied SI/HI today. Endorses AVH, delusions per above. Reported EPS sxs of tight muscles and restlessness, which I increased his cogentin to 1 mg bid. Consider cross tapering patient to seroquel or different antipsychotic. AIMS 0 on 06/11/2022.  Patient will be transferred to [facility] 500 unit hall.  Agree with plan per above.  Signed: [facility], DO Psychiatry Resident, PGY-1 [facility] 06/11/2022, 11:19 AM

## 2022-06-11 NOTE — Progress Notes (Signed)
Pt was accepted [facility]; Bed Assignment 501-1  Dx Bipolar 1 DO  Pt meets inpatient criteria per Hillery Jacks, NP  Attending Physician will be [place] MD.   Report can be called to: -Adult unit: [phone]  Pt can arrive after: Pending  Care Team notified:Julie [place], [place], [facility] Rona Ravens, RN, [place].  Kelton Pillar, LCSWA 06/11/2022 @ 11:05 AM

## 2022-06-11 NOTE — Discharge Instructions (Signed)
To see which pharmacy near you is the CHEAPEST for certain medications, please use GoodRx. It is free website and has a free phone app.      Also consider looking at [facility]'s $4.00 or [facility]'s $7.00 prescription list. Both are free to view if googled "[facility] $4 prescription" and "public's $7 prescription". These are set prices, no insurance required.   For issues with sleep, please use this free app for insomnia called CBT-I. Let your doctors and therapists know so they can help with extra tips and tricks or for guidance and accountability. NO ADDS on the app.     For therapy outside the hospital, please ask for these specific types of therapy: CBT   Please make regular appointments with an outpatient psychiatrist and other doctors once you leave the hospital.    Suicide hotline: 988 Emergency: 911 

## 2022-06-11 NOTE — Tx Team (Signed)
Initial Treatment Plan 06/11/2022 5:46 PM Gerasimos Fikes EKC:[id number]    PATIENT STRESSORS: Medication change or noncompliance     PATIENT STRENGTHS: Physical Health  Supportive family/friends    PATIENT IDENTIFIED PROBLEMS: Non- compliance with medication                     DISCHARGE CRITERIA:  Adequate post-discharge living arrangements  PRELIMINARY DISCHARGE PLAN: Return to previous living arrangement  PATIENT/FAMILY INVOLVEMENT: This treatment plan has been presented to and reviewed with the patient, Mario Thomas, and/or family member, .  The patient and family have been given the opportunity to ask questions and make suggestions.  Guadlupe Spanish, RN 06/11/2022, 5:46 PM

## 2022-06-11 NOTE — ED Notes (Signed)
Breakfast given.  

## 2022-06-12 DIAGNOSIS — F312 Bipolar disorder, current episode manic severe with psychotic features: Secondary | ICD-10-CM

## 2022-06-12 DIAGNOSIS — F319 Bipolar disorder, unspecified: Secondary | ICD-10-CM

## 2022-06-12 DIAGNOSIS — F411 Generalized anxiety disorder: Secondary | ICD-10-CM

## 2022-06-12 DIAGNOSIS — G4700 Insomnia, unspecified: Secondary | ICD-10-CM

## 2022-06-12 DIAGNOSIS — Z789 Other specified health status: Secondary | ICD-10-CM

## 2022-06-12 LAB — TSH: TSH: 2.472 u[IU]/mL (ref 0.350–4.500)

## 2022-06-12 MED ORDER — PALIPERIDONE ER 6 MG PO TB24
9.0000 mg | ORAL_TABLET | Freq: Every day | ORAL | Status: DC
  Administered 2022-06-13 – 2022-06-15 (×3): 9 mg via ORAL
  Filled 2022-06-12 (×4): qty 1

## 2022-06-12 MED ORDER — ZIPRASIDONE MESYLATE 20 MG IM SOLR: 20.0000 mg | INTRAMUSCULAR | Status: DC | PRN

## 2022-06-12 MED ORDER — LORAZEPAM 1 MG PO TABS: 1.0000 mg | ORAL_TABLET | Freq: Four times a day (QID) | ORAL | Status: DC | PRN

## 2022-06-12 MED ORDER — LORAZEPAM 1 MG PO TABS: 1.0000 mg | ORAL_TABLET | ORAL | Status: DC | PRN

## 2022-06-12 MED ORDER — OLANZAPINE 5 MG PO TBDP: 5.0000 mg | ORAL_TABLET | Freq: Three times a day (TID) | ORAL | Status: DC | PRN

## 2022-06-12 NOTE — Progress Notes (Addendum)
Patient refused EKG. He stated he already had one done at the hospital but no recent EKG results are found. Patient was told that the provider ordered an EKG to be done and patient stated. "I don't want it. I don't like those sticky things on me." Provider notified.

## 2022-06-12 NOTE — Progress Notes (Signed)
The focus of this group is to help patients review their daily goal of treatment and discuss progress on daily workbooks.  Pt attended the evening group and responded to all discussion prompts from the Writer. Pt shared that today was a good day on the unit, the highlight of which was getting answers to spiritual questions that brought him "comfort." Pt did not elaborate on the specifics of these questions.  Gerrad told that his goal for the week was to continue bettering himself however he can.  Pt rated his day a 7 out of 10 and his affect was appropriate.

## 2022-06-12 NOTE — [facility] Group Notes (Signed)
Patient did not attend orientation group, however completed self inventory sheet in his room.
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
## 2022-06-12 NOTE — Progress Notes (Signed)
   06/12/22 2100  Psych Admission Type (Psych Patients Only)  Admission Status Involuntary  Psychosocial Assessment  Patient Complaints None  Eye Contact Fair  Facial Expression Flat  Affect Flat  Speech Logical/coherent  Interaction Assertive  Motor Activity Slow  Appearance/Hygiene Unremarkable  Behavior Characteristics Appropriate to situation  Mood Pleasant  Aggressive Behavior  Effect No apparent injury  Thought Process  Coherency WDL  Content WDL  Delusions WDL  Perception WDL  Hallucination None reported or observed  Judgment Limited  Confusion WDL  Danger to Self  Current suicidal ideation? Denies  Danger to Others  Danger to Others None reported or observed

## 2022-06-12 NOTE — BHH Suicide Risk Assessment (Signed)
Suicide Risk Assessment  Admission Assessment    [facility] Admission Suicide Risk Assessment   Nursing information obtained from:  Patient Demographic factors:  Male Current Mental Status:  NA Loss Factors:  NA Historical Factors:  Impulsivity Risk Reduction Factors:  Positive social support, Living with another person, especially a relative  Total Time spent with patient: 1.5 hours Principal Problem: Bipolar disorder with psychotic features ([place]) Diagnosis:  Principal Problem:   Bipolar disorder with psychotic features ([place]) Active Problems:   GAD (generalized anxiety disorder)   Insomnia   Alcohol use  History of Present Illness: Elise Serres is a 21 y.o male with a history of bipolar 1 d/o, ADHD who was involuntarily committed and taken to the [employer] health urgent care [facility]) for making threats to his parents (stated today is the last day that they will live), bizarre behaviors (trying to take a car parked in the garage which is in front of  two other vehicles w/o moving those vehicles), and medication non compliance. Pt was previously hospitalized at this [facility] from 11/09/21 through 11/17/2021 for manic type behaviors. For this hospitalization, pt was transferred to this [facility] for treatment and stabilization of his mood.   Continued Clinical Symptoms: On assessment today, pt reports +auditory hallucinations of the voice of God telling him "I have a plan for you", and reports +VH of "positive messages from God". Pt is observed to be religiously preoccupied, makes multiple references to biblical quotations, states that God talks directly to him and sometimes talks to him in dreams, through signs and through nature. He reports that God puts thoughts into his brain and puts thoughts into other people's brains. He states God is in control, and states: "Jesus brought Ma Rings back to life. I feel like I already died. I think I'll live forever because I already died. There are parallel  universes. When someone dies, they are in the process of learning who God is." Pt's current symptoms place him at high risk of danger to self and continuous hospitalization is required to treat and stabilize his mood.  Alcohol Use Disorder Identification Test Final Score (AUDIT): 2 The "Alcohol Use Disorders Identification Test", Guidelines for Use in Primary Care, Second Edition.  World Pharmacologist [facility]). Score between 0-7:  no or low risk or alcohol related problems. Score between 8-15:  moderate risk of alcohol related problems. Score between 16-19:  high risk of alcohol related problems. Score 20 or above:  warrants further diagnostic evaluation for alcohol dependence and treatment.  CLINICAL FACTORS:   More than one psychiatric diagnosis   Musculoskeletal: Strength & Muscle Tone: within normal limits Gait & Station: normal Patient leans: N/A  Psychiatric Specialty Exam:  Presentation  General Appearance: Appropriate for Environment; Fairly Groomed  Eye Contact:Good  Speech:Clear and Coherent  Speech Volume:Normal  Handedness:Right   Mood and Affect  Mood:Anxious  Affect:Congruent   Thought Process  Thought Processes:Coherent  Descriptions of Associations:Intact  Orientation:Full (Time, Place and Person)  Thought Content:Illogical  History of Schizophrenia/Schizoaffective disorder:No  Duration of Psychotic Symptoms:Greater than six months  Hallucinations:Hallucinations: Auditory Description of Auditory Hallucinations: voice of God  Ideas of Reference:Delusions  Suicidal Thoughts:Suicidal Thoughts: No SI Passive Intent and/or Plan: -- (per above)  Homicidal Thoughts:Homicidal Thoughts: No   Sensorium  Memory:Immediate Good  Judgment:Poor  Insight:Poor   Executive Functions  Concentration:Fair  Attention Span:Fair  [place]   Psychomotor Activity  Psychomotor Activity:Psychomotor  Activity: Normal   Assets  Assets:Housing; Social Support   Sleep  Sleep:Sleep: Poor    Physical Exam: Physical Exam Constitutional:      Appearance: Normal appearance.  HENT:     Head: Normocephalic.     Nose: Nose normal. No congestion.  Eyes:     Pupils: Pupils are equal, round, and reactive to light.  Musculoskeletal:     Cervical back: Normal range of motion.  Neurological:     Mental Status: He is alert and oriented to person, place, and time.    Review of Systems  Constitutional: Negative.   HENT: Negative.    Eyes: Negative.   Respiratory: Negative.    Cardiovascular: Negative.   Gastrointestinal: Negative.   Genitourinary: Negative.   Musculoskeletal: Negative.   Skin: Negative.   Neurological: Negative.   Psychiatric/Behavioral:  Positive for depression, hallucinations and substance abuse. Negative for memory loss and suicidal ideas. The patient is nervous/anxious and has insomnia.    Blood pressure 125/85, pulse 86, temperature 97.7 F (36.5 C), temperature source Oral, resp. rate 16, height 5\' 11"  (1.803 m), weight 101 kg, SpO2 100 %. Body mass index is 31.06 kg/m.   COGNITIVE FEATURES THAT CONTRIBUTE TO RISK:  None    SUICIDE RISK:   Moderate:  Frequent suicidal ideation with limited intensity, and duration, some specificity in terms of plans, no associated intent, good self-control, limited dysphoria/symptomatology, some risk factors present, and identifiable protective factors, including available and accessible social support.  PLAN Safety and Monitoring: Voluntary admission to inpatient psychiatric unit for safety, stabilization and treatment Daily contact with patient to assess and evaluate symptoms and progress in treatment Patient's case to be discussed in multi-disciplinary team meeting Observation Level : q15 minute checks Vital signs: q12 hours Precautions: Safety                            Long Term Goal(s): Improvement in symptoms  so as ready for discharge   Short Term Goals: Ability to identify changes in lifestyle to reduce recurrence of condition will improve, Ability to disclose and discuss suicidal ideas, Ability to demonstrate self-control will improve, and Ability to maintain clinical measurements within normal limits will improve   Diagnoses: Bipolar 1 d/o with psychotic features R/O Schizoaffective d/o Principal Problem:   Bipolar disorder with psychotic features ([facility]) Active Problems:   GAD (generalized anxiety disorder)   Insomnia   Alcohol use                           Medications -Increase Invega to 9 mg nightly starting 7/4 for psychosis -Continue Trazodone 50 mg nightly PRN for insomnia -Continue Cogentin 1 mg BID for EPS prophylaxis -Continue Hydroxyzine 25 mg every 6 hours PRN for anxiety -Continue Ativan 1mg  tabs every 6 hours PRN for CIWA > 10 -Start Agitation protocol (Zyprexa/Ativan/Geodon PRN)-See MAR   Other PRNS -Continue Tylenol 650 mg every 6 hours PRN for mild pain -Continue Milk of Magnesia as needed every 6 hrs for constipation -Continue Zofran disintegrating tabs every 6 hrs PRN for nausea    Discharge Planning: Social work and case management to assist with discharge planning and identification of hospital follow-up needs prior to discharge Estimated LOS: 5-7 days Discharge Concerns: Need to establish a safety plan; Medication compliance and effectiveness Discharge Goals: Return home with outpatient referrals for mental health follow-up including medication management/psychotherapy  I certify that inpatient services furnished can reasonably be expected to  improve the patient's condition.   Starleen Blue, NP 06/12/2022, 6:19 PM

## 2022-06-12 NOTE — Progress Notes (Signed)
   06/12/22 1000  Psych Admission Type (Psych Patients Only)  Admission Status Involuntary  Psychosocial Assessment  Patient Complaints None  Eye Contact Fair  Facial Expression Flat  Affect Flat  Speech Logical/coherent  Interaction Assertive  Motor Activity Other (Comment) (WDL)  Appearance/Hygiene Unremarkable;In scrubs  Behavior Characteristics Cooperative;Appropriate to situation  Mood Pleasant  Thought Process  Coherency WDL  Content WDL  Delusions None reported or observed  Perception WDL  Hallucination None reported or observed  Judgment Limited  Confusion WDL  Danger to Self  Current suicidal ideation? Denies  Agreement Not to Harm Self Yes  Description of Agreement Verbal  Danger to Others  Danger to Others None reported or observed

## 2022-06-13 ENCOUNTER — Encounter (HOSPITAL_COMMUNITY): Payer: Self-pay

## 2022-06-13 DIAGNOSIS — F319 Bipolar disorder, unspecified: Secondary | ICD-10-CM | POA: Diagnosis not present

## 2022-06-13 LAB — HEPATITIS PANEL, ACUTE
HCV Ab: NONREACTIVE
Hep A IgM: NONREACTIVE
Hep B C IgM: NONREACTIVE
Hepatitis B Surface Ag: NONREACTIVE

## 2022-06-13 NOTE — BHH Suicide Risk Assessment (Signed)
BHH INPATIENT:  Family/Significant Other Suicide Prevention Education  Suicide Prevention Education:  Education Completed; [phone] (name of family member/significant other) has been identified by the patient as the family member/significant other with whom the patient will be residing, and identified as the person(s) who will aid the patient in the event of a mental health crisis (suicidal ideations/suicide attempt).  With written consent from the patient, the family member/significant other has been provided the following suicide prevention education, prior to the and/or following the discharge of the patient.  CSW spoke with mother and discussed patient care.  Mother reports that medications have been very expensive and too strong.  Patient also discussed how patient was very suspicious about medication which kept him for taking it.  Mother also discussed that medications with lots of side effects he has not good buy in.  Mother reports that patient was taken to [place] for treatment but patient did not successfully finish treatment.  Mom and Dad want patient to be self sufficient so they are unsure if he should be able to come back to their house.  CSW informed about his options with shelter but that finding a new place usually does not happen during a hospital stay. Mother would like [place] to find patient a new place to stay and for him to take responsibility on his own.  Mother believes patient can go back to college but patient will have a self defeating attitude about it. No safety concerns/access to gun or weapons.  The suicide prevention education provided includes the following: Suicide risk factors Suicide prevention and interventions National Suicide Hotline telephone number [facility] assessment telephone number [facility] Emergency Assistance 911 [facility] and/or Residential Mobile Crisis Unit telephone number  Request made of family/significant other  to: Remove weapons (e.g., guns, rifles, knives), all items previously/currently identified as safety concern.   Remove drugs/medications (over-the-counter, prescriptions, illicit drugs), all items previously/currently identified as a safety concern.  The family member/significant other verbalizes understanding of the suicide prevention education information provided.  The family member/significant other agrees to remove the items of safety concern listed above.  Evangelia Whitaker E Jerimy Johanson 06/13/2022, 3:28 PM

## 2022-06-13 NOTE — Progress Notes (Signed)
Pt denies SI/HI/AVH and verbally agrees to approach staff if these become apparent or before harming themselves/others. Rates depression 0/10. Rates anxiety 0/10. Rates pain 0/10.  Pt has been making delusional statements throughout the day per MHT. Pt refused an EKG due to not liking the stickers and just because he does not like them. Pt reassured and educated on why we needed one. Pt still refused and stated that it was his right to refuse. Pt has been interacting okay with others and has been in and out of his room throughout the day. Scheduled medications administered to pt, per MD orders. RN provided support and encouragement to pt. Q15 min safety checks implemented and continued. Pt safe on the unit. RN will continue to monitor and intervene as needed.   06/13/22 0818  Psych Admission Type (Psych Patients Only)  Admission Status Involuntary  Psychosocial Assessment  Patient Complaints None  Eye Contact Fair  Facial Expression Flat  Affect Appropriate to circumstance  Speech Logical/coherent  Interaction Assertive  Motor Activity Other (Comment) (WDL)  Appearance/Hygiene Unremarkable  Behavior Characteristics Appropriate to situation;Calm  Mood Pleasant  Thought Process  Coherency WDL  Content Delusions  Delusions Grandeur  Perception WDL  Hallucination None reported or observed  Judgment Limited  Confusion None  Danger to Self  Current suicidal ideation? Denies

## 2022-06-13 NOTE — Progress Notes (Signed)
Recreation Therapy Notes  INPATIENT RECREATION THERAPY ASSESSMENT  Patient Details Name: Mario Thomas MRN: [id number] DOB: 01/24/2001 Today's Date: 06/13/2022       Information Obtained From: Patient  Able to Participate in Assessment/Interview: Yes  Patient Presentation:  (Hyper-religious)  Reason for Admission (Per Patient): Other (Comments) ("I was in distress, my parents were concerned and brought me here".)  Patient Stressors: Family, Death (Parents)  Coping Skills:   Sports, TV, Music, Exercise, Meditate, Deep Breathing, Prayer, Talk, Avoidance, Read, Hot Bath/Shower  Leisure Interests (2+):  Individual - Reading, Individual - Other (Comment) (Explore topics; Technology)  Frequency of Recreation/Participation: Other (Comment) (Daily)  Awareness of Community Resources:  Yes  Community Resources:  Park, Public affairs consultant, Other (Comment) (Stores)  Current Use: Yes  If no, Barriers?:    Expressed Interest in [employer] Information: No  County of Residence:  [place]  Patient Main Form of Transportation: Set designer  Patient Strengths:  Probation officer; Learned to do things that are necessary  Patient Identified Areas of Improvement:  Patience; Listen more  Patient Goal for Hospitalization:  "be patient, pray, talk to God and enjoy while I'm here"  Current SI (including self-harm):  No  Current HI:  No  Current AVH: No  Staff Intervention Plan: Group Attendance, Collaborate with Interdisciplinary Treatment Team  Consent to Intern Participation: N/A   Caroll Rancher, Richardean Sale, Brad Mcgaughy A 06/13/2022, 1:20 PM

## 2022-06-13 NOTE — Group Note (Signed)
Recreation Therapy Group Note   Group Topic:Problem Solving  Group Date: 06/13/2022 Start Time: 1005 End Time: 1030 Facilitators: Caroll Rancher, LRT,CTRS Location: [address] Dayroom   Goal Area(s) Addresses:  Patient will effectively work with peer towards shared goal.  Patient will identify skills used to make activity successful.  Patient will identify how skills used during activity can be used to reach post d/c goals.    Group Description:  Straw Bridge. In teams of 3-5, patients were given 15 plastic drinking straws and an equal length of masking tape. Using the materials provided, patients were instructed to build a free standing bridge-like structure to suspend an everyday item (ex: puzzle box) off of the floor or table surface. All materials were required to be used by the team in their design. LRT facilitated post-activity discussion reviewing team process. Patients were encouraged to reflect how the skills used in this activity can be generalized to daily life post discharge.   Affect/Mood: Appropriate   Participation Level: Engaged   Participation Quality: Independent   Behavior: Appropriate   Speech/Thought Process: Focused   Insight: Good   Judgement: Good   Modes of Intervention: Problem-solving   Patient Response to Interventions:  Engaged   Education Outcome:  Acknowledges education and In group clarification offered    Clinical Observations/Individualized Feedback: Pt was team oriented.  Pt was able to listen and focus on the suggestions of his peers.  Pt also made suggestions in completing the activity.  Pt was bright and engaged throughout. Pt also explained the group used creativity to complete the project.     Plan: Continue to engage patient in RT group sessions 2-3x/week.   Caroll Rancher, LRT,CTRS 06/13/2022 12:55 PM

## 2022-06-13 NOTE — BH IP Treatment Plan (Signed)
Interdisciplinary Treatment and Diagnostic Plan Update  06/13/2022 Time of Session: 9:50am  Mario Thomas MRN: [id number]  Principal Diagnosis: Bipolar disorder with psychotic features ([place])  Secondary Diagnoses: Principal Problem:   Bipolar disorder with psychotic features ([place]) Active Problems:   GAD (generalized anxiety disorder)   Insomnia   Alcohol use   Current Medications:  Current Facility-Administered Medications  Medication Dose Route Frequency Provider Last Rate Last Admin   acetaminophen (TYLENOL) tablet 650 mg  650 mg Oral Q6H PRN Merrily Brittle, DO       alum & mag hydroxide-simeth (MAALOX/MYLANTA) 200-200-20 MG/5ML suspension 30 mL  30 mL Oral Q4H PRN Merrily Brittle, DO       benztropine (COGENTIN) tablet 1 mg  1 mg Oral BID Merrily Brittle, DO   1 mg at 06/13/22 0818   hydrOXYzine (ATARAX) tablet 50 mg  50 mg Oral Q4H PRN Merrily Brittle, DO   50 mg at 06/12/22 2054   OLANZapine zydis (ZYPREXA) disintegrating tablet 5 mg  5 mg Oral Q8H PRN Nicholes Rough, NP       And   LORazepam (ATIVAN) tablet 1 mg  1 mg Oral PRN Nicholes Rough, NP       And   ziprasidone (GEODON) injection 20 mg  20 mg Intramuscular PRN Nicholes Rough, NP       LORazepam (ATIVAN) tablet 1 mg  1 mg Oral Q6H PRN Nicholes Rough, NP       magnesium hydroxide (MILK OF MAGNESIA) suspension 30 mL  30 mL Oral Daily PRN Merrily Brittle, DO       paliperidone (INVEGA) 24 hr tablet 9 mg  9 mg Oral Daily Nkwenti, Doris, NP   9 mg at 06/13/22 0818   traZODone (DESYREL) tablet 50 mg  50 mg Oral QHS PRN Evette Georges, NP   50 mg at 06/12/22 2054   PTA Medications: Medications Prior to Admission  Medication Sig Dispense Refill Last Dose   benztropine (COGENTIN) 1 MG tablet Take 1 tablet (1 mg total) by mouth 2 (two) times daily.      paliperidone (INVEGA) 6 MG 24 hr tablet Take 1 tablet (6 mg total) by mouth daily.       Patient Stressors: Medication change or noncompliance    Patient Strengths: Physical Health   Supportive family/friends   Treatment Modalities: Medication Management, Group therapy, Case management,  1 to 1 session with clinician, Psychoeducation, Recreational therapy.   Physician Treatment Plan for Primary Diagnosis: Bipolar disorder with psychotic features ([place]) Long Term Goal(s): Improvement in symptoms so as ready for discharge   Short Term Goals: Ability to identify changes in lifestyle to reduce recurrence of condition will improve Ability to disclose and discuss suicidal ideas Ability to demonstrate self-control will improve Ability to maintain clinical measurements within normal limits will improve  Medication Management: Evaluate patient's response, side effects, and tolerance of medication regimen.  Therapeutic Interventions: 1 to 1 sessions, Unit Group sessions and Medication administration.  Evaluation of Outcomes: Not Met  Physician Treatment Plan for Secondary Diagnosis: Principal Problem:   Bipolar disorder with psychotic features ([place]) Active Problems:   GAD (generalized anxiety disorder)   Insomnia   Alcohol use  Long Term Goal(s): Improvement in symptoms so as ready for discharge   Short Term Goals: Ability to identify changes in lifestyle to reduce recurrence of condition will improve Ability to disclose and discuss suicidal ideas Ability to demonstrate self-control will improve Ability to maintain clinical measurements within normal limits will improve  Medication Management: Evaluate patient's response, side effects, and tolerance of medication regimen.  Therapeutic Interventions: 1 to 1 sessions, Unit Group sessions and Medication administration.  Evaluation of Outcomes: Not Met   RN Treatment Plan for Primary Diagnosis: Bipolar disorder with psychotic features ([place]) Long Term Goal(s): Knowledge of disease and therapeutic regimen to maintain health will improve  Short Term Goals: Ability to remain free from injury will improve, Ability to  participate in decision making will improve, Ability to verbalize feelings will improve, Ability to disclose and discuss suicidal ideas, and Ability to identify and develop effective coping behaviors will improve  Medication Management: RN will administer medications as ordered by provider, will assess and evaluate patient's response and provide education to patient for prescribed medication. RN will report any adverse and/or side effects to prescribing provider.  Therapeutic Interventions: 1 on 1 counseling sessions, Psychoeducation, Medication administration, Evaluate responses to treatment, Monitor vital signs and CBGs as ordered, Perform/monitor CIWA, COWS, AIMS and Fall Risk screenings as ordered, Perform wound care treatments as ordered.  Evaluation of Outcomes: Not Met   LCSW Treatment Plan for Primary Diagnosis: Bipolar disorder with psychotic features [facility]) Long Term Goal(s): Safe transition to appropriate next level of care at discharge, Engage patient in therapeutic group addressing interpersonal concerns.  Short Term Goals: Engage patient in aftercare planning with referrals and resources, Increase social support, Increase emotional regulation, Facilitate acceptance of mental health diagnosis and concerns, Identify triggers associated with mental health/substance abuse issues, and Increase skills for wellness and recovery  Therapeutic Interventions: Assess for all discharge needs, 1 to 1 time with Social worker, Explore available resources and support systems, Assess for adequacy in community support network, Educate family and significant other(s) on suicide prevention, Complete Psychosocial Assessment, Interpersonal group therapy.  Evaluation of Outcomes: Not Met   Progress in Treatment: Attending groups: Yes. Participating in groups: Yes. Taking medication as prescribed: Yes. Toleration medication: Yes. Family/Significant other contact made: Yes, individual(s) contacted:   Mother  Patient understands diagnosis: No. Discussing patient identified problems/goals with staff: Yes. Medical problems stabilized or resolved: Yes. Denies suicidal/homicidal ideation: Yes. Issues/concerns per patient self-inventory: No.  New problem(s) identified: No, Describe:  None   New Short Term/Long Term Goal(s): medication stabilization, elimination of SI thoughts, development of comprehensive mental wellness plan.   Patient Goals: "To learn new coping skills"   Discharge Plan or Barriers: Patient recently admitted. CSW will continue to follow and assess for appropriate referrals and possible discharge planning.   Reason for Continuation of Hospitalization: Anxiety Hallucinations Medication stabilization  Estimated Length of Stay: 3 to 5 days   Last 3 [place] Suicide Severity Risk Score: Flowsheet Row Admission (Current) from 06/11/2022 in [place] [facility] ED from 06/09/2022 in [facility] ED from 05/28/2022 in [place] No Risk Error: Question 2 not populated Error: Question 2 not populated       Last [facility] 2/9 Scores:    11/27/2019    9:41 AM 08/04/2018    9:03 AM  Depression screen PHQ 2/9  Decreased Interest 0 0  Down, Depressed, Hopeless 0 0  PHQ - 2 Score 0 0    Scribe for Treatment Team: Darleen Crocker, Latanya Presser 06/13/2022 2:54 PM
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
## 2022-06-13 NOTE — Progress Notes (Signed)
   06/13/22 0500  Sleep  Number of Hours 6

## 2022-06-13 NOTE — Group Note (Signed)
Type of Therapy and Topic:  Group Therapy:  Stress Management   Participation Level:  Active    Description of Group:  Patients in this group were introduced to the idea of stress and encouraged to discuss negative and positive ways to manage stress. Patients discussed specific stressors that they have in their life right now and the physical signs and symptoms associated with that stress.  Patient encouraged to come up with positive changes to assist with the stress upon discharge in order to prevent future hospitalizations.   They also worked as a group on developing a specific plan for several patients to deal with stressors through boundary-setting, psychoeducation and self care techniques   Therapeutic Goals:               1)  To discuss the positive and negative impacts of stress             2)  identify signs and symptoms of stress             3)  generate ideas for stress management             4)  offer mutual support to others regarding stress management             5)  Developing plans for ways to manage specific stressors upon discharge               Summary of Patient Progress:  Patient was appropriate in group and actively participated.  Patient dicussed that he often walks and listens to music to cope with stress.  Patient actively participated in group discussion regarding stress management.  He discussed relying on his faith to get through stress and hard times.    Therapeutic Modalities:   Motivational Interviewing Brief Solution-Focused Therapy   Cherell Colvin, LCSW, LCAS Clincal Social Worker  [facility]

## 2022-06-13 NOTE — Progress Notes (Signed)
The focus of this group is to help patients review their daily goal of treatment and discuss progress on daily workbooks.  Pt attended the evening group and responded to all discussion prompts from the Writer. Pt shared that today was a good day on the unit, the highlight of which was "having lively conversations with everyone."  Pt told that, upon discharge, he planned to take better care of himself by listening to others. "By listening more, I want to argue less and have more peace in my life."  Pt rated his day an 8 out of 10 and his affect was appropriate.

## 2022-06-13 NOTE — Progress Notes (Addendum)
[facility] MD Progress Note  06/13/2022 4:33 PM Mario Thomas  MRN:  [id number]  Subjective:  [name] reports: I will say today, I'm doing much better. I was praying to God last night and saying God please give me answers. I learned something from being here. I heard God's voice in the morning that if God is for me, who can be against me?"  Reason for admission: Pt's chart reviewed, case discussed with his treatment team. Pt presents today with a euthymic mood, and affect is congruent. His attention to personal hygiene and grooming is fair, eye contact is good, speech is clear & coherent. Thought contents are organized, but with some illogical content.  Pt currently denies SI/HI/AH or paranoia. He presents with delusional thinking and +AH, and states that God talks directly to him, and made multiple biblical references during this admission. He continues to be religiously preoccupied and makes statements as noted above stating that he is hearing the voice of God. He however reports feeling much better today than he did yesterday.   Pt educated today about the benefits of a long acting Invega injection, but states that he is worried about the side effects that he might get from the medication. Pt educated that it will be same medication as he takes in the pill form, but releases over the course of a month. He seems not to be comprehending the rationale for this medication at this time. Will continue to revisit this issue and continue to educate the pt on this on a daily basis. Will continue medications as listed below.   Collateral information was obtained from pt's mother by CSW. Please see CSW notes for today.   Principal Problem: Bipolar disorder with psychotic features (HCC) Diagnosis: Principal Problem:   Bipolar disorder with psychotic features (HCC) Active Problems:   GAD (generalized anxiety disorder)   Insomnia   Alcohol use  Total Time spent with patient: 30 minutes  Past Psychiatric History: Bipolar  d/o  Past Medical History:  Past Medical History:  Diagnosis Date   Heart murmur    MDD (major depressive disorder), severe (HCC) 08/23/2018   History reviewed. No pertinent surgical history. Family History:  Family History  Problem Relation Age of Onset   Hyperlipidemia Father    Family Psychiatric  History: none, reported Social History:  Social History   Substance and Sexual Activity  Alcohol Use Never     Social History   Substance and Sexual Activity  Drug Use Never    Social History   Socioeconomic History   Marital status: Single    Spouse name: Not on file   Number of children: Not on file   Years of education: Not on file   Highest education level: Not on file  Occupational History   Not on file  Tobacco Use   Smoking status: Never   Smokeless tobacco: Never  Vaping Use   Vaping Use: Never used  Substance and Sexual Activity   Alcohol use: Never   Drug use: Never   Sexual activity: Never  Other Topics Concern   Not on file  Social History Narrative   01/23/21   From: the area   Living: with parents   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, 1 sibling      Enjoys: running      Exercise: daily running    Diet: meat, veggies, grains      Safety  Seat belts: Yes    Guns: No   Safe in relationships: Yes    Social Determinants of Corporate investment banker Strain: Not on file  Food Insecurity: Not on file  Transportation Needs: Not on file  Physical Activity: Not on file  Stress: Not on file  Social Connections: Not on file   Additional Social History:   Sleep: Good  Appetite:  Good  Current Medications: Current Facility-Administered Medications  Medication Dose Route Frequency Provider Last Rate Last Admin   acetaminophen (TYLENOL) tablet 650 mg  650 mg Oral Q6H PRN Princess Bruins, DO       alum & mag hydroxide-simeth (MAALOX/MYLANTA) 200-200-20 MG/5ML suspension 30 mL   30 mL Oral Q4H PRN Princess Bruins, DO       benztropine (COGENTIN) tablet 1 mg  1 mg Oral BID Princess Bruins, DO   1 mg at 06/13/22 0818   hydrOXYzine (ATARAX) tablet 50 mg  50 mg Oral Q4H PRN Princess Bruins, DO   50 mg at 06/12/22 2054   OLANZapine zydis (ZYPREXA) disintegrating tablet 5 mg  5 mg Oral Q8H PRN Starleen Blue, NP       And   LORazepam (ATIVAN) tablet 1 mg  1 mg Oral PRN Starleen Blue, NP       And   ziprasidone (GEODON) injection 20 mg  20 mg Intramuscular PRN Kambry Takacs, NP       LORazepam (ATIVAN) tablet 1 mg  1 mg Oral Q6H PRN Kwan Shellhammer, NP       magnesium hydroxide (MILK OF MAGNESIA) suspension 30 mL  30 mL Oral Daily PRN Princess Bruins, DO       paliperidone (INVEGA) 24 hr tablet 9 mg  9 mg Oral Daily Chiana Wamser, NP   9 mg at 06/13/22 0818   traZODone (DESYREL) tablet 50 mg  50 mg Oral QHS PRN Sindy Guadeloupe, NP   50 mg at 06/12/22 2054   Lab Results:  Results for orders placed or performed during the hospital encounter of 06/11/22 (from the past 48 hour(s))  TSH     Status: None   Collection Time: 06/12/22  6:41 PM  Result Value Ref Range   TSH 2.472 0.350 - 4.500 uIU/mL    Comment: Performed by a 3rd Generation assay with a functional sensitivity of <=0.01 uIU/mL. Performed at [facility], [address], [place], [place] [address]   Hepatitis panel, acute     Status: None   Collection Time: 06/12/22  6:56 PM  Result Value Ref Range   Hepatitis B Surface Ag NON REACTIVE NON REACTIVE   HCV Ab NON REACTIVE NON REACTIVE    Comment: (NOTE) Nonreactive HCV antibody screen is consistent with no HCV infections,  unless recent infection is suspected or other evidence exists to indicate HCV infection.     Hep A IgM NON REACTIVE NON REACTIVE   Hep B C IgM NON REACTIVE NON REACTIVE    Comment: Performed at [facility], [address], [place], [place] [address]   Blood Alcohol level:  Lab Results  Component Value Date   [facility] <10  11/08/2021   ETH <10 08/07/2019   Metabolic Disorder Labs: Lab Results  Component Value Date   HGBA1C 4.9 05/28/2022   MPG 93.93 05/28/2022   MPG 96.8 11/15/2021   Lab Results  Component Value Date   PROLACTIN 10.0 08/24/2018   Lab Results  Component Value Date   CHOL 136 05/28/2022  TRIG 246 (H) 05/28/2022   HDL 46 05/28/2022   CHOLHDL 3.0 05/28/2022   VLDL 49 (H) 05/28/2022   LDLCALC 41 05/28/2022   LDLCALC 41 11/15/2021   Physical Findings: AIMS: Facial and Oral Movements Muscles of Facial Expression: None, normal Lips and Perioral Area: None, normal Jaw: None, normal Tongue: None, normal,Extremity Movements Upper (arms, wrists, hands, fingers): None, normal Lower (legs, knees, ankles, toes): None, normal, Trunk Movements Neck, shoulders, hips: None, normal, Overall Severity Severity of abnormal movements (highest score from questions above): None, normal Incapacitation due to abnormal movements: None, normal Patient's awareness of abnormal movements (rate only patient's report): No Awareness, Dental Status Current problems with teeth and/or dentures?: No Does patient usually wear dentures?: No  CIWA:  CIWA-Ar Total: 0 COWS:    AIMS:0 Musculoskeletal: Strength & Muscle Tone: within normal limits Gait & Station: normal Patient leans: N/A  Psychiatric Specialty Exam:  Presentation  General Appearance: Appropriate for Environment; Fairly Groomed  Eye Contact:Fair  Speech:Clear and Coherent  Speech Volume:Normal  Handedness:Right  Mood and Affect  Mood:Euthymic  Affect:Congruent  Thought Process  Thought Processes:Coherent  Descriptions of Associations:Intact  Orientation:Full (Time, Place and Person)  Thought Content:Illogical  History of Schizophrenia/Schizoaffective disorder:No  Duration of Psychotic Symptoms:Greater than six months  Hallucinations:Hallucinations: Auditory Description of Auditory Hallucinations: +AH of God's  voice  Ideas of Reference:Delusions  Suicidal Thoughts:Suicidal Thoughts: No  Homicidal Thoughts:Homicidal Thoughts: No  Sensorium  Memory:Immediate Good  Judgment:Poor  Insight:Poor  Executive Functions  Concentration:Poor  Attention Span:Poor  Recall:Fair  Fund of Knowledge:Fair  Language:Fair  Psychomotor Activity  Psychomotor Activity:Psychomotor Activity: Normal  Assets  Assets:Communication Skills  Sleep  Sleep:Sleep: Good  Physical Exam: Physical Exam Constitutional:      Appearance: Normal appearance.  HENT:     Head: Normocephalic.     Nose: Nose normal.  Eyes:     Pupils: Pupils are equal, round, and reactive to light.  Musculoskeletal:        General: Normal range of motion.     Cervical back: Normal range of motion.  Neurological:     Mental Status: He is alert and oriented to person, place, and time.     Sensory: No sensory deficit.     Coordination: Coordination normal.  Psychiatric:        Behavior: Behavior normal.        Thought Content: Thought content normal.    Review of Systems  Constitutional: Negative.  Negative for fever.  HENT: Negative.    Skin: Negative.   Neurological: Negative.   Psychiatric/Behavioral:  Positive for hallucinations. Negative for depression, memory loss, substance abuse and suicidal ideas. The patient is nervous/anxious. The patient does not have insomnia.    Blood pressure 136/85, pulse 69, temperature 98.4 F (36.9 C), temperature source Oral, resp. rate 18, height 5\' 11"  (1.803 m), weight 101 kg, SpO2 100 %. Body mass index is 31.06 kg/m.  Treatment Plan Summary: Daily contact with patient to assess and evaluate symptoms and progress in treatment and Medication management   Observation Level/Precautions:  15 minute checks  Laboratory:  Labs reviewed   Psychotherapy:  Unit Group sessions  Medications:  See [facility]  Consultations:  To be determined   Discharge Concerns:  Safety, medication compliance,  mood stability  Estimated LOS: 5-7 days  Other:  N/A    PLAN Safety and Monitoring: Voluntary admission to inpatient psychiatric unit for safety, stabilization and treatment Daily contact with patient to assess and evaluate symptoms and progress in  treatment Patient's case to be discussed in multi-disciplinary team meeting Observation Level : q15 minute checks Vital signs: q12 hours Precautions: Safety                            Long Term Goal(s): Improvement in symptoms so as ready for discharge   Short Term Goals: Ability to identify changes in lifestyle to reduce recurrence of condition will improve, Ability to disclose and discuss suicidal ideas, Ability to demonstrate self-control will improve, and Ability to maintain clinical measurements within normal limits will improve   Diagnoses: Bipolar 1 d/o with psychotic features R/O Schizoaffective d/o Principal Problem:   Bipolar disorder with psychotic features (HCC) Active Problems:   GAD (generalized anxiety disorder)   Insomnia   Alcohol use                           Medications -Continue Invega to 9 mg nightly starting 7/4 for psychosis -Continue Trazodone 50 mg nightly PRN for insomnia -Continue Cogentin 1 mg BID for EPS prophylaxis -Continue Hydroxyzine 25 mg every 6 hours PRN for anxiety -Continue Ativan 1mg  tabs every 6 hours PRN for CIWA > 10 -Continue Agitation protocol (Zyprexa/Ativan/Geodon PRN)-See MAR   Other PRNS -Continue Tylenol 650 mg every 6 hours PRN for mild pain -Continue Milk of Magnesia as needed every 6 hrs for constipation -Continue Zofran disintegrating tabs every 6 hrs PRN for nausea    Discharge Planning: Social work and case management to assist with discharge planning and identification of hospital follow-up needs prior to discharge Estimated LOS: 5-7 days Discharge Concerns: Need to establish a safety plan; Medication compliance and effectiveness Discharge Goals: Return home with outpatient  referrals for mental health follow-up including medication management/psychotherapy  , NP 06/13/2022, 4:33 PM

## 2022-06-14 DIAGNOSIS — F319 Bipolar disorder, unspecified: Secondary | ICD-10-CM | POA: Diagnosis not present

## 2022-06-14 NOTE — Progress Notes (Signed)
Pt denies SI/HI/AVH and verbally agrees to approach staff if these become apparent or before harming themselves/others. Rates depression 0/10. Rates anxiety 0/10. Rates pain 0/10.  Pt has been in the dayroom for the majority of the day. Pt is involved in groups and going outside. Pt seems to have a slightly bit  more insight but somewhat still delusional. Pt seemed to be a little more paranoid at the medication window as he asked the same question multiple times and gotten the same answer. Pt agreed to an EKG and was completed. Scheduled medications administered to pt, per MD orders. RN provided support and encouragement to pt. Q15 min safety checks implemented and continued. Pt safe on the unit. RN will continue to monitor and intervene as needed.   06/14/22 0801  Psych Admission Type (Psych Patients Only)  Admission Status Involuntary  Psychosocial Assessment  Patient Complaints None  Eye Contact Fair  Facial Expression Flat  Affect Appropriate to circumstance;Flat  Speech Logical/coherent  Interaction Assertive  Motor Activity Other (Comment) (WDL)  Appearance/Hygiene Unremarkable  Behavior Characteristics Appropriate to situation;Cooperative;Calm  Mood Pleasant  Thought Process  Coherency WDL  Content Delusions  Delusions Grandeur  Perception WDL  Hallucination None reported or observed  Judgment Limited  Confusion None  Danger to Self  Current suicidal ideation? Denies  Danger to Others  Danger to Others None reported or observed

## 2022-06-14 NOTE — Progress Notes (Signed)
[facility] MD Progress Note  06/14/2022 4:57 PM Mario Thomas  MRN:  [id number]  Subjective:  Mario Thomas reports: "I was pacing back and forth in my room and asking questions about the world, about science, just everything. God is everything"  Reason for admission: Mario Thomas is a 21 y.o male with a history of bipolar 1 d/o, ADHD who was involuntarily committed and taken to the [facility] urgent care [facility]) for making threats to his parents (stated today is the last day that they will live), bizarre behaviors (trying to take a car parked in the garage which is in front of  two other vehicles w/o moving those vehicles), and medication non compliance. Pt was previously hospitalized at this [facility] from 11/09/21 through 11/17/2021 for manic type behaviors. For this hospitalization, pt was transferred to this [facility] for treatment and stabilization of his mood.   Today's patient assessment note:  Pt's chart reviewed, case discussed with his treatment team. Pt presents today with a euthymic mood, and affect is congruent. His attention to personal hygiene and grooming is poor, and the need to tend to personal hygiene and grooming has been reiterated and personal hygiene needs have been provided.  Eye contact is good, speech is clear & coherent. Thought contents are organized, but pt continues to present, with some illogical content.  Pt currently denies SI/HI/AH or paranoia. He continues to present with delusional thinking and +AH, and states that he is still hearing God talking directly to him.  He continues to make reference to multiple biblical quotations. He reports that he was in his room talking to asking questions while pacing, and God talked to him loud and clear, and answered the questions that he was asking. Pt reports a good sleep quality last night, reports a good appetite, and denies any medication related side effects. There are no abnormal movements of any of his extremities, and no stiffness  reported or found on assessment. AIMS:0  Collateral information from patient's mother: Pt's verbal consent obtained and his mother (Meseret) called at [phone]. Writer introduced self and explained to pt's mother than call was intended to explain pt's mental status, and to get any additional information that mother thinks is necessary for his treatment team to know and to consider in his treatment. Pt's mother stated at start of call that she wants pt to be able to "take responsibility for himself", because she feels as though pt takes for granted the fact that he lives at home, and does not want to do things to better himself. She reported being frustrated with the way pt acts, and wants him to live by himself so that he can better himself. Writer educated pt's mother on pt's current diagnosis of bipolar d/o with psychotic features. Writer explained to pt's mother that pt's illness renders him unable to make the best judgments sometimes, especially when he is not taking his medications.   Pt's mother explained that Abilify 10 mg in the past, was helpful in alleviating pt's symptoms. She explained that she noticed that pt was calmer and was more productive while on the Abilify.  She explained that after the last admission here, the Invega worsened pt's symptoms. Mother reports that the [place] caused restlessness, suicidal thoughts, and made pt to "talk differently". She reports that she prefers for the Abilify to be tried again, and that pt's symptoms only returned during the last admission, after pt stopped taking it. This information will be discussed with pt's treatment team  tomorrow.  Writer also educated pt's mother on possible guardianship for pt. Writer educated mother that CSW will be informed to call her and discuss this information more with her since Clinical research associatewriter is not well versed in the guardianship application process. Mother denied any medical issues in pt. Writer thanked mother and call  ended.  Pt's medications will continue as listed below, and Abilify will be considered after pt's case is revisited tomorrow in treatment team.  Principal Problem: Bipolar disorder with psychotic features (HCC) Diagnosis: Principal Problem:   Bipolar disorder with psychotic features (HCC) Active Problems:   GAD (generalized anxiety disorder)   Insomnia   Alcohol use  Total Time spent with patient: 30 minutes  Past Psychiatric History: Bipolar d/o  Past Medical History:  Past Medical History:  Diagnosis Date   Heart murmur    MDD (major depressive disorder), severe (HCC) 08/23/2018   History reviewed. No pertinent surgical history. Family History:  Family History  Problem Relation Age of Onset   Hyperlipidemia Father    Family Psychiatric  History: none, reported Social History:  Social History   Substance and Sexual Activity  Alcohol Use Never     Social History   Substance and Sexual Activity  Drug Use Never    Social History   Socioeconomic History   Marital status: Single    Spouse name: Not on file   Number of children: Not on file   Years of education: Not on file   Highest education level: Not on file  Occupational History   Not on file  Tobacco Use   Smoking status: Never   Smokeless tobacco: Never  Vaping Use   Vaping Use: Never used  Substance and Sexual Activity   Alcohol use: Never   Drug use: Never   Sexual activity: Never  Other Topics Concern   Not on file  Social History Narrative   01/23/21   From: the area   Living: with parents   Work: Counselling psychologistood [employer]   School: Actuarylectrical engineering at [employer]      Family: good relationship with parents, 1 sibling      Enjoys: running      Exercise: daily running    Diet: meat, veggies, grains      Safety   Seat belts: Yes    Guns: No   Safe in relationships: Yes    Social Determinants of Corporate investment bankerHealth   Financial Resource Strain: Not on file  Food Insecurity: Not on file  Transportation Needs: Not  on file  Physical Activity: Not on file  Stress: Not on file  Social Connections: Not on file   Additional Social History:   Sleep: Good  Appetite:  Good  Current Medications: Current Facility-Administered Medications  Medication Dose Route Frequency Provider Last Rate Last Admin   acetaminophen (TYLENOL) tablet 650 mg  650 mg Oral Q6H PRN Princess BruinsNguyen, Julie, DO       alum & mag hydroxide-simeth (MAALOX/MYLANTA) 200-200-20 MG/5ML suspension 30 mL  30 mL Oral Q4H PRN Princess BruinsNguyen, Julie, DO       benztropine (COGENTIN) tablet 1 mg  1 mg Oral BID Princess BruinsNguyen, Julie, DO   1 mg at 06/14/22 0801   hydrOXYzine (ATARAX) tablet 50 mg  50 mg Oral Q4H PRN Princess BruinsNguyen, Julie, DO   50 mg at 06/12/22 2054   OLANZapine zydis (ZYPREXA) disintegrating tablet 5 mg  5 mg Oral Q8H PRN Starleen BlueNkwenti, [name], NP       And   LORazepam (ATIVAN) tablet 1 mg  1 mg Oral PRN Starleen Blue, NP       And   ziprasidone (GEODON) injection 20 mg  20 mg Intramuscular PRN Salvatore Poe, NP       LORazepam (ATIVAN) tablet 1 mg  1 mg Oral Q6H PRN Starleen Blue, NP       magnesium hydroxide (MILK OF MAGNESIA) suspension 30 mL  30 mL Oral Daily PRN Princess Bruins, DO       paliperidone (INVEGA) 24 hr tablet 9 mg  9 mg Oral Daily Batool Majid, NP   9 mg at 06/14/22 0801   traZODone (DESYREL) tablet 50 mg  50 mg Oral QHS PRN Sindy Guadeloupe, NP   50 mg at 06/13/22 2026   Lab Results:  Results for orders placed or performed during the hospital encounter of 06/11/22 (from the past 48 hour(s))  TSH     Status: None   Collection Time: 06/12/22  6:41 PM  Result Value Ref Range   TSH 2.472 0.350 - 4.500 uIU/mL    Comment: Performed by a 3rd Generation assay with a functional sensitivity of <=0.01 uIU/mL. Performed at [facility], [address], [place], [place] [address]   Hepatitis panel, acute     Status: None   Collection Time: 06/12/22  6:56 PM  Result Value Ref Range   Hepatitis B Surface Ag NON REACTIVE NON REACTIVE    HCV Ab NON REACTIVE NON REACTIVE    Comment: (NOTE) Nonreactive HCV antibody screen is consistent with no HCV infections,  unless recent infection is suspected or other evidence exists to indicate HCV infection.     Hep A IgM NON REACTIVE NON REACTIVE   Hep B C IgM NON REACTIVE NON REACTIVE    Comment: Performed at [facility], [address], [place], [place] [address]   Blood Alcohol level:  Lab Results  Component Value Date   [facility] <10 11/08/2021   ETH <10 08/07/2019   Metabolic Disorder Labs: Lab Results  Component Value Date   HGBA1C 4.9 05/28/2022   MPG 93.93 05/28/2022   MPG 96.8 11/15/2021   Lab Results  Component Value Date   PROLACTIN 10.0 08/24/2018   Lab Results  Component Value Date   CHOL 136 05/28/2022   TRIG 246 (H) 05/28/2022   HDL 46 05/28/2022   CHOLHDL 3.0 05/28/2022   VLDL 49 (H) 05/28/2022   LDLCALC 41 05/28/2022   LDLCALC 41 11/15/2021   Physical Findings: AIMS: Facial and Oral Movements Muscles of Facial Expression: None, normal Lips and Perioral Area: None, normal Jaw: None, normal Tongue: None, normal,Extremity Movements Upper (arms, wrists, hands, fingers): None, normal Lower (legs, knees, ankles, toes): None, normal, Trunk Movements Neck, shoulders, hips: None, normal, Overall Severity Severity of abnormal movements (highest score from questions above): None, normal Incapacitation due to abnormal movements: None, normal Patient's awareness of abnormal movements (rate only patient's report): No Awareness, Dental Status Current problems with teeth and/or dentures?: No Does patient usually wear dentures?: No  CIWA:  CIWA-Ar Total: 1 COWS:    AIMS:0 Musculoskeletal: Strength & Muscle Tone: within normal limits Gait & Station: normal Patient leans: N/A  Psychiatric Specialty Exam:  Presentation  General Appearance: Disheveled  Eye Contact:Good  Speech:Clear and Coherent  Speech Volume:Normal  Handedness:Right  Mood  and Affect  Mood:Euthymic  Affect:Congruent  Thought Process  Thought Processes:Coherent  Descriptions of Associations:Intact  Orientation:Full (Time, Place and Person)  Thought Content:Illogical  History of Schizophrenia/Schizoaffective disorder:No  Duration of Psychotic  Symptoms:Greater than six months  Hallucinations:Hallucinations: Auditory Description of Auditory Hallucinations: God's voice  Ideas of Reference:None  Suicidal Thoughts:Suicidal Thoughts: No  Homicidal Thoughts:Homicidal Thoughts: No  Sensorium  Memory:Immediate Good  Judgment:Poor  Insight:Poor  Executive Functions  Concentration:Fair  Attention Span:Fair  Recall:Fair  Fund of Knowledge:Fair  Language:Fair  Psychomotor Activity  Psychomotor Activity:Psychomotor Activity: Normal  Assets  Assets:Communication Skills  Sleep  Sleep:Sleep: Good  Physical Exam: Physical Exam Constitutional:      Appearance: Normal appearance.  HENT:     Head: Normocephalic.     Nose: Nose normal.  Eyes:     Pupils: Pupils are equal, round, and reactive to light.  Musculoskeletal:        General: Normal range of motion.     Cervical back: Normal range of motion.  Neurological:     Mental Status: He is alert and oriented to person, place, and time.     Sensory: No sensory deficit.     Coordination: Coordination normal.  Psychiatric:        Behavior: Behavior normal.        Thought Content: Thought content normal.    Review of Systems  Constitutional: Negative.  Negative for fever.  HENT: Negative.    Skin: Negative.   Neurological: Negative.   Psychiatric/Behavioral:  Positive for hallucinations. Negative for depression, memory loss, substance abuse and suicidal ideas. The patient is nervous/anxious. The patient does not have insomnia.    Blood pressure 133/69, pulse 94, temperature 98 F (36.7 C), temperature source Oral, resp. rate 18, height 5\' 11"  (1.803 m), weight 101 kg, SpO2 100 %.  Body mass index is 31.06 kg/m.  Treatment Plan Summary: Daily contact with patient to assess and evaluate symptoms and progress in treatment and Medication management   Observation Level/Precautions:  15 minute checks  Laboratory:  Labs reviewed   Psychotherapy:  Unit Group sessions  Medications:  See [facility]  Consultations:  To be determined   Discharge Concerns:  Safety, medication compliance, mood stability  Estimated LOS: 5-7 days  Other:  N/A    PLAN Safety and Monitoring: Voluntary admission to inpatient psychiatric unit for safety, stabilization and treatment Daily contact with patient to assess and evaluate symptoms and progress in treatment Patient's case to be discussed in multi-disciplinary team meeting Observation Level : q15 minute checks Vital signs: q12 hours Precautions: Safety                            Long Term Goal(s): Improvement in symptoms so as ready for discharge   Short Term Goals: Ability to identify changes in lifestyle to reduce recurrence of condition will improve, Ability to disclose and discuss suicidal ideas, Ability to demonstrate self-control will improve, and Ability to maintain clinical measurements within normal limits will improve   Diagnoses: Bipolar 1 d/o with psychotic features R/O Schizoaffective d/o Principal Problem:   Bipolar disorder with psychotic features (HCC) Active Problems:   GAD (generalized anxiety disorder)   Insomnia   Alcohol use                           Medications -Continue Invega to 9 mg nightly starting 7/4 for psychosis -Continue Trazodone 50 mg nightly PRN for insomnia -Continue Cogentin 1 mg BID for EPS prophylaxis -Continue Hydroxyzine 25 mg every 6 hours PRN for anxiety -Continue Ativan 1mg  tabs every 6 hours PRN for CIWA > 10 -Continue Agitation  protocol (Zyprexa/Ativan/Geodon PRN)-See MAR   Other PRNS -Continue Tylenol 650 mg every 6 hours PRN for mild pain -Continue Milk of Magnesia as needed every 6 hrs  for constipation -Continue Zofran disintegrating tabs every 6 hrs PRN for nausea    Discharge Planning: Social work and case management to assist with discharge planning and identification of hospital follow-up needs prior to discharge Estimated LOS: 5-7 days Discharge Concerns: Need to establish a safety plan; Medication compliance and effectiveness Discharge Goals: Return home with outpatient referrals for mental health follow-up including medication management/psychotherapy  Starleen Blue, NP 06/14/2022, 4:57 PMPatient ID: Romilda Joy, male   DOB: [birth year], 20 y.o.   MRN: [id number]

## 2022-06-14 NOTE — Group Note (Signed)
Recreation Therapy Group Note   Group Topic:Communication  Group Date: 06/14/2022 Start Time: 1000 End Time: 1040 Facilitators: Caroll Rancher, LRT,CTRS Location: [address] Dayroom   Goal Area(s) Addresses:  Patient will effectively listen to complete activity.  Patient will identify communication skills used to make activity successful.  Patient will identify how skills used during activity can be used to reach post d/c goals.    Group Description:  Geometric Drawings.  Three volunteers from the peer group will be shown an abstract picture with a particular arrangement of geometrical shapes.  Each round, one 'speaker' will describe the pattern, as accurately as possible without revealing the image to the group.  The remaining group members will listen and draw the picture to reflect how it is described to them. Patients with the role of 'listener' cannot ask clarifying questions but, may request that the speaker repeat a direction. Once the drawings are complete, the presenter will show the rest of the group the picture and compare how close each person came to drawing the picture. LRT will facilitate a post-activity discussion regarding effective communication and the importance of planning, listening, and asking for clarification in daily interactions with others.   Affect/Mood: Appropriate   Participation Level: Engaged   Participation Quality: Independent   Behavior: Appropriate   Speech/Thought Process: Focused   Insight: Good   Judgement: Good   Modes of Intervention: Activity   Patient Response to Interventions:  Engaged   Education Outcome:  Acknowledges education and In group clarification offered    Clinical Observations/Individualized Feedback: Pt was engaged and on topic for much of the group.  Pt identified some methods of communication as art and telepathically.  Pt also described one of the benefits of listening is getting an "understanding of what the person  is saying".  Pt presented one of the drawings and as a result expressed he could have been more detailed in his instructions.  Lastly, pt stated one way to get better communication is to look for patterns. Pt explained knowing the patterns in which a person speaks, allows for people to ask questions when they need clarity.     Plan: Continue to engage patient in RT group sessions 2-3x/week.   Caroll Rancher, LRT,CTRS 06/14/2022 12:54 PM

## 2022-06-15 DIAGNOSIS — F319 Bipolar disorder, unspecified: Secondary | ICD-10-CM | POA: Diagnosis not present

## 2022-06-15 MED ORDER — PALIPERIDONE ER 6 MG PO TB24
12.0000 mg | ORAL_TABLET | Freq: Every day | ORAL | Status: DC
  Administered 2022-06-16: 12 mg via ORAL
  Filled 2022-06-15 (×4): qty 2

## 2022-06-15 NOTE — Progress Notes (Signed)
SPIRITUALITY GROUP NOTE  Spirituality group facilitated by Wilkie Aye, MDiv, BCC.  Group Description:  Group focused on topic of hope.  Patients participated in facilitated discussion around topic, connecting with one another around experiences and definitions for hope.  Group members engaged with visual explorer photos, reflecting on what hope looks like for them today.  Group engaged in discussion around how their definitions of hope are present today in hospital.   Modalities: Psycho-social ed, Adlerian, Narrative, MI Patient Progress: Lucian was present throughout group.  Oriented and on topic, he engaged with facilitator and group members.

## 2022-06-15 NOTE — BHH Counselor (Addendum)
CSW educated patient mother with how to pursue guardianship.  Mother discussed how this is doctors recommendations and asked appropriate questions. CSW agreed to educate patient about options and guardianship. CSW also reviewed patient outpatient follow up.   Addendum: CSW spoke with patient about guardianship and disability.  Patient was open to education around applying for disability but was unsure about recommendations for guardianship.    Ali Mohl, LCSW, LCAS Clincal Social Worker  [facility]

## 2022-06-15 NOTE — Progress Notes (Signed)
[facility] MD Progress Note  06/15/2022 12:25 PM Favor Hackler  MRN:  [id number]  Subjective:  Davyd reports: "I am feeling good..I hear God's voice through other people. Everybody is God. I hear his voice through other people or he speaks directly to me."  Reason for admission: Aron Fasig is a 21 y.o male with a history of bipolar 1 d/o, ADHD who was involuntarily committed and taken to the [facility] health urgent care [facility]) for making threats to his parents (stated today is the last day that they will live), bizarre behaviors (trying to take a car parked in the garage which is in front of  two other vehicles w/o moving those vehicles), and medication non compliance. Pt was previously hospitalized at this [facility] from 11/09/21 through 11/17/2021 for manic type behaviors. For this hospitalization, pt was transferred to this [facility] for treatment and stabilization of his mood.   Today's patient assessment note:  Pt's chart reviewed, case discussed with his treatment team. Pt presents today with a euthymic mood, and affect is congruent. His attention to personal hygiene and grooming is fair, and he reports feeling better today, his personal hygiene is noted to be improving, and he is less talkative today. Eye contact is good, speech is clear & coherent. Thought contents are organized and logical, and pt currently denies SI/HI/VH or paranoia. He continues to be religiously preoccupied, and continues to state that he hears the voice of God speaking directly to him giving him messages, and also hears the voice of God in other people, but he is focusing on religion less today, which is an improvement.    Pt reports that he had a good sleep quality last night, and is also reporting a good appetite. He denies any current medication related side effects, and denies any EPS type symptoms related to medications, and none observed on assessment. AIMS:0. Pt is observed to have dry mouth, and fluids have been encouraged.    Writer spoke to pt's mother and she stated that [place] caused him to be suicidal and had other negative symptoms. However, pt reports today that what caused the negative symptoms was Abilify. He reports that Abilify "made me numb, almost like a robot with no feelings, it felt like an out of body experience, and it was like my soul left me." Pt reports that Invega in the past caused him to limp when he walked or ran long distances, and also caused muscle pain. He reports that Hinda Glatter has helped, but he stopped it because of the issue with limping and muscle pain. He reports willingness to continue Invega, and denies any muscle pain at this time. Will increase dose of Invega to 12 mg, and timing changed to nightly starting tomorrow night, as pt seems to be sleepy in the mornings with daytime dosing . Pt has again been educated on the benefits and rationales of an LAI, but is still resistant at this time. EKG obtained yesterday with [id number] & "Septal infarct , age undetermined". Will repeat EKG within next couple of days.  Principal Problem: Bipolar disorder with psychotic features (HCC) Diagnosis: Principal Problem:   Bipolar disorder with psychotic features (HCC) Active Problems:   GAD (generalized anxiety disorder)   Insomnia   Alcohol use  Total Time spent with patient: 30 minutes  Past Psychiatric History: Bipolar d/o  Past Medical History:  Past Medical History:  Diagnosis Date   Heart murmur    MDD (major depressive disorder), severe (HCC) 08/23/2018  History reviewed. No pertinent surgical history. Family History:  Family History  Problem Relation Age of Onset   Hyperlipidemia Father    Family Psychiatric  History: none, reported Social History:  Social History   Substance and Sexual Activity  Alcohol Use Never     Social History   Substance and Sexual Activity  Drug Use Never    Social History   Socioeconomic History   Marital status: Single    Spouse name: Not on  file   Number of children: Not on file   Years of education: Not on file   Highest education level: Not on file  Occupational History   Not on file  Tobacco Use   Smoking status: Never   Smokeless tobacco: Never  Vaping Use   Vaping Use: Never used  Substance and Sexual Activity   Alcohol use: Never   Drug use: Never   Sexual activity: Never  Other Topics Concern   Not on file  Social History Narrative   01/23/21   From: the area   Living: with parents   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, 1 sibling      Enjoys: running      Exercise: daily running    Diet: meat, veggies, grains      Safety   Seat belts: Yes    Guns: No   Safe in relationships: Yes    Social Determinants of Corporate investment banker Strain: Not on file  Food Insecurity: Not on file  Transportation Needs: Not on file  Physical Activity: Not on file  Stress: Not on file  Social Connections: Not on file   Additional Social History:   Sleep: Good  Appetite:  Good  Current Medications: Current Facility-Administered Medications  Medication Dose Route Frequency Provider Last Rate Last Admin   acetaminophen (TYLENOL) tablet 650 mg  650 mg Oral Q6H PRN Princess Bruins, DO       alum & mag hydroxide-simeth (MAALOX/MYLANTA) 200-200-20 MG/5ML suspension 30 mL  30 mL Oral Q4H PRN Princess Bruins, DO       benztropine (COGENTIN) tablet 1 mg  1 mg Oral BID Princess Bruins, DO   1 mg at 06/15/22 0800   hydrOXYzine (ATARAX) tablet 50 mg  50 mg Oral Q4H PRN Princess Bruins, DO   50 mg at 06/14/22 2037   OLANZapine zydis (ZYPREXA) disintegrating tablet 5 mg  5 mg Oral Q8H PRN Starleen Blue, NP       And   LORazepam (ATIVAN) tablet 1 mg  1 mg Oral PRN Starleen Blue, NP       And   ziprasidone (GEODON) injection 20 mg  20 mg Intramuscular PRN Maille Halliwell, NP       LORazepam (ATIVAN) tablet 1 mg  1 mg Oral Q6H PRN Barbaraann Avans, NP        magnesium hydroxide (MILK OF MAGNESIA) suspension 30 mL  30 mL Oral Daily PRN Princess Bruins, DO       [START ON 06/16/2022] paliperidone (INVEGA) 24 hr tablet 12 mg  12 mg Oral QHS Venissa Nappi, NP       traZODone (DESYREL) tablet 50 mg  50 mg Oral QHS PRN Sindy Guadeloupe, NP   50 mg at 06/14/22 2037   Lab Results:  No results found for this or any previous visit (from the past 48 hour(s)).  Blood Alcohol level:  Lab Results  Component  Value Date   [facility] <10 11/08/2021   ETH <10 08/07/2019   Metabolic Disorder Labs: Lab Results  Component Value Date   HGBA1C 4.9 05/28/2022   MPG 93.93 05/28/2022   MPG 96.8 11/15/2021   Lab Results  Component Value Date   PROLACTIN 10.0 08/24/2018   Lab Results  Component Value Date   CHOL 136 05/28/2022   TRIG 246 (H) 05/28/2022   HDL 46 05/28/2022   CHOLHDL 3.0 05/28/2022   VLDL 49 (H) 05/28/2022   LDLCALC 41 05/28/2022   LDLCALC 41 11/15/2021   Physical Findings: AIMS: Facial and Oral Movements Muscles of Facial Expression: None, normal Lips and Perioral Area: None, normal Jaw: None, normal Tongue: None, normal,Extremity Movements Upper (arms, wrists, hands, fingers): None, normal Lower (legs, knees, ankles, toes): None, normal, Trunk Movements Neck, shoulders, hips: None, normal, Overall Severity Severity of abnormal movements (highest score from questions above): None, normal Incapacitation due to abnormal movements: None, normal Patient's awareness of abnormal movements (rate only patient's report): No Awareness, Dental Status Current problems with teeth and/or dentures?: No Does patient usually wear dentures?: No  CIWA:  CIWA-Ar Total: 0 COWS:    AIMS:0 Musculoskeletal: Strength & Muscle Tone: within normal limits Gait & Station: normal Patient leans: N/A  Psychiatric Specialty Exam:  Presentation  General Appearance: Appropriate for Environment; Fairly Groomed  Eye Contact:Good  Speech:Clear and Coherent  Speech  Volume:Normal  Handedness:Right  Mood and Affect  Mood:Euthymic  Affect:Congruent  Thought Process  Thought Processes:Coherent  Descriptions of Associations:Intact  Orientation:Full (Time, Place and Person)  Thought Content:Illogical  History of Schizophrenia/Schizoaffective disorder:No  Duration of Psychotic Symptoms:Greater than six months  Hallucinations:Hallucinations: Auditory Description of Auditory Hallucinations: God's voice  Ideas of Reference:None  Suicidal Thoughts:Suicidal Thoughts: No  Homicidal Thoughts:Homicidal Thoughts: No  Sensorium  Memory:Immediate Good  Judgment:Fair  Insight:Fair  Executive Functions  Concentration:Good  Attention Span:Good  Recall:Good; Diplomatic Services operational officer of Knowledge:Fair  Language:Fair  Psychomotor Activity  Psychomotor Activity:Psychomotor Activity: Normal  Assets  Assets:Communication Skills; Social Support  Sleep  Sleep:Sleep: Good  Physical Exam: Physical Exam Constitutional:      Appearance: Normal appearance.  HENT:     Head: Normocephalic.     Nose: Nose normal.  Eyes:     Pupils: Pupils are equal, round, and reactive to light.  Musculoskeletal:        General: Normal range of motion.     Cervical back: Normal range of motion.  Neurological:     Mental Status: He is alert and oriented to person, place, and time.     Sensory: No sensory deficit.     Coordination: Coordination normal.  Psychiatric:        Behavior: Behavior normal.        Thought Content: Thought content normal.    Review of Systems  Constitutional: Negative.  Negative for fever.  HENT: Negative.    Skin: Negative.   Neurological: Negative.   Psychiatric/Behavioral:  Positive for hallucinations. Negative for depression, memory loss, substance abuse and suicidal ideas. The patient is nervous/anxious. The patient does not have insomnia.    Blood pressure 131/78, pulse 63, temperature 98.3 F (36.8 C), temperature source Oral,  resp. rate 17, height 5\' 11"  (1.803 m), weight 101 kg, SpO2 100 %. Body mass index is 31.06 kg/m.  Treatment Plan Summary: Daily contact with patient to assess and evaluate symptoms and progress in treatment and Medication management   Observation Level/Precautions:  15 minute checks  Laboratory:  Labs reviewed  Psychotherapy:  Unit Group sessions  Medications:  See [facility]  Consultations:  To be determined   Discharge Concerns:  Safety, medication compliance, mood stability  Estimated LOS: 5-7 days  Other:  N/A    PLAN Safety and Monitoring: Voluntary admission to inpatient psychiatric unit for safety, stabilization and treatment Daily contact with patient to assess and evaluate symptoms and progress in treatment Patient's case to be discussed in multi-disciplinary team meeting Observation Level : q15 minute checks Vital signs: q12 hours Precautions: Safety                            Long Term Goal(s): Improvement in symptoms so as ready for discharge   Short Term Goals: Ability to identify changes in lifestyle to reduce recurrence of condition will improve, Ability to disclose and discuss suicidal ideas, Ability to demonstrate self-control will improve, and Ability to maintain clinical measurements within normal limits will improve   Diagnoses: Bipolar 1 d/o with psychotic features R/O Schizoaffective d/o Principal Problem:   Bipolar disorder with psychotic features (HCC) Active Problems:   GAD (generalized anxiety disorder)   Insomnia   Alcohol use                           Medications -Increase Invega to 12 mg nightly starting 7/8 for psychosis -Continue Trazodone 50 mg nightly PRN for insomnia -Continue Cogentin 1 mg BID for EPS prophylaxis -Continue Hydroxyzine 25 mg every 6 hours PRN for anxiety -Continue Ativan 1mg  tabs every 6 hours PRN for CIWA > 10 -Continue Agitation protocol (Zyprexa/Ativan/Geodon PRN)-See MAR   Other PRNS -Continue Tylenol 650 mg every 6 hours  PRN for mild pain -Continue Milk of Magnesia as needed every 6 hrs for constipation -Continue Zofran disintegrating tabs every 6 hrs PRN for nausea    Discharge Planning: Social work and case management to assist with discharge planning and identification of hospital follow-up needs prior to discharge Estimated LOS: 5-7 days Discharge Concerns: Need to establish a safety plan; Medication compliance and effectiveness Discharge Goals: Return home with outpatient referrals for mental health follow-up including medication management/psychotherapy  , NP 06/15/2022, 12:25 PMPatient ID: 08/16/2022, male   DOB: 31-Aug-2001, 20 y.o.   MRN: 09/07/2000 Patient ID: Kypton Eltringham, male   DOB: 2001-07-18, 21 y.o.   MRN: [id number]

## 2022-06-15 NOTE — Progress Notes (Addendum)
   06/14/22 2035  Psych Admission Type (Psych Patients Only)  Admission Status Involuntary  Psychosocial Assessment  Patient Complaints None  Eye Contact Fair  Facial Expression Anxious  Affect Appropriate to circumstance  Speech Logical/coherent  Interaction Assertive  Motor Activity Other (Comment) (wnl)  Appearance/Hygiene Unremarkable  Behavior Characteristics Cooperative;Appropriate to situation  Mood Anxious;Pleasant  Thought Process  Coherency WDL  Content WDL  Delusions None reported or observed  Perception WDL  Hallucination None reported or observed  Judgment Limited  Confusion None  Danger to Self  Current suicidal ideation? Denies  Danger to Others  Danger to Others None reported or observed   Progress note   D: Pt seen at med window. Pt denies SI, HI, AVH. Pt rates pain  0/10. Pt rates anxiety  0/10 and depression  0/10. Pt states that he has been attending group and that his appetite is good. Pt said he may need more sleep medicine tonight because he hasn't been sleeping well. Pt told to let this writer know if he was having trouble falling back asleep after waking up. Pt verbalized understanding. Pt denies any withdrawal symptoms. No other complaints noted.   A: Pt provided support and encouragement. Pt given scheduled medication as prescribed. PRNs as appropriate. Q15 min checks for safety.   R: Pt safe on the unit. Will continue to monitor.

## 2022-06-15 NOTE — Group Note (Signed)
Recreation Therapy Group Note   Group Topic:Team Building  Group Date: 06/15/2022 Start Time: 1002 End Time: 1033 Facilitators: Caroll Rancher, LRT,CTRS Location: [address] Dayroom   Goal Area(s) Addresses:  Patient will effectively work with peer towards shared goal.  Patient will identify skill used to make activity successful.  Patient will identify how skills used during activity can be used to reach post d/c goals.   Group Description:  Patient(s) were given a set of solo cups, a rubber band, and some tied strings. The objective is to build a pyramid with the cups by only using the rubber band and string to move the cups. After the activity the patient(s) and LRT debriefed and discussed what strategies worked, what didn't, and what lessons they can take from the activity and use in life post discharge.    Affect/Mood: Appropriate   Participation Level: Engaged   Participation Quality: Independent   Behavior: Appropriate   Speech/Thought Process: Focused   Insight: Good   Judgement: Good   Modes of Intervention: Team-building   Patient Response to Interventions:  Engaged   Education Outcome:  Acknowledges education and In group clarification offered    Clinical Observations/Individualized Feedback: Pt was engaged, worked well with peers and made suggestions on what the group could do to be successful.  Pt asked questions as needed.  Pt made the pyramid fall at one point and disqualified himself.  Pt decided to try an idea, on his own, he came up with.  Pt was unable to complete the activity on his own but had some ideas he would use.   Plan: Continue to engage patient in RT group sessions 2-3x/week.   Caroll Rancher, LRT,CTRS 06/15/2022 12:54 PM

## 2022-06-15 NOTE — Progress Notes (Signed)
   06/15/22 2000  Psych Admission Type (Psych Patients Only)  Admission Status Involuntary  Psychosocial Assessment  Patient Complaints None  Eye Contact Fair  Facial Expression Flat  Affect Appropriate to circumstance  Speech Logical/coherent  Interaction Assertive  Motor Activity Slow  Appearance/Hygiene In scrubs  Behavior Characteristics Cooperative  Mood Anxious;Pleasant  Aggressive Behavior  Effect No apparent injury  Thought Process  Coherency WDL  Content WDL  Delusions WDL  Perception WDL  Hallucination None reported or observed  Judgment WDL  Confusion None  Danger to Self  Current suicidal ideation? Denies

## 2022-06-15 NOTE — Group Note (Signed)
[facility] LCSW Group Therapy Note   Group Date: 06/15/2022 Start Time: 1300 End Time: 1400   Type of Therapy/Topic:  Group Therapy:  Emotion Regulation  Participation Level:  Active   Description of Group:    The purpose of this group is to assist patients in learning to regulate negative emotions and experience positive emotions. Patients will be guided to discuss ways in which they have been vulnerable to their negative emotions. These vulnerabilities will be juxtaposed with experiences of positive emotions or situations, and patients challenged to use positive emotions to combat negative ones. Special emphasis will be placed on coping with negative emotions in conflict situations, and patients will process healthy conflict resolution skills.  Therapeutic Goals: Patient will identify two positive emotions or experiences to reflect on in order to balance out negative emotions:  Patient will label two or more emotions that they find the most difficult to experience:  Patient will be able to demonstrate positive conflict resolution skills through discussion or role plays:   Summary of Patient Progress:   Patient was active in group. Patient discussed feeling overjoyed due to his faith and support from God.  Patient was hyperreligious in group and had to be redirected to the topic on many occasions.  Patient was mostly appropriate with other group members.     Therapeutic Modalities:   Cognitive Behavioral Therapy Feelings Identification Dialectical Behavioral Therapy   Kevante Lunt E Zaim Nitta, LCSW

## 2022-06-15 NOTE — Progress Notes (Signed)
   06/15/22 0900  Psych Admission Type (Psych Patients Only)  Admission Status Involuntary  Psychosocial Assessment  Patient Complaints None  Eye Contact Fair  Facial Expression Flat  Affect Appropriate to circumstance  Speech Logical/coherent  Interaction Assertive  Motor Activity Other (Comment) (WDL)  Appearance/Hygiene In scrubs  Behavior Characteristics Cooperative;Appropriate to situation;Calm  Mood Anxious;Pleasant  Thought Process  Coherency WDL  Content WDL  Delusions None reported or observed  Perception WDL  Hallucination None reported or observed  Judgment Limited  Confusion None  Danger to Self  Current suicidal ideation? Denies  Danger to Others  Danger to Others None reported or observed

## 2022-06-16 DIAGNOSIS — F319 Bipolar disorder, unspecified: Secondary | ICD-10-CM | POA: Diagnosis not present

## 2022-06-16 MED ORDER — HALOPERIDOL 5 MG PO TABS
5.0000 mg | ORAL_TABLET | Freq: Two times a day (BID) | ORAL | Status: DC
  Administered 2022-06-16 – 2022-06-17 (×3): 5 mg via ORAL
  Filled 2022-06-16 (×6): qty 1

## 2022-06-16 NOTE — Progress Notes (Signed)
   06/16/22 2000  Psych Admission Type (Psych Patients Only)  Admission Status Involuntary  Psychosocial Assessment  Patient Complaints None  Eye Contact Brief  Facial Expression Flat  Affect Flat  Speech Logical/coherent  Interaction Assertive  Motor Activity Other (Comment) (wnl)  Appearance/Hygiene Unremarkable;In scrubs  Behavior Characteristics Cooperative;Appropriate to situation  Mood Pleasant  Thought Process  Coherency WDL  Content WDL  Delusions None reported or observed  Perception WDL  Hallucination None reported or observed  Judgment WDL  Confusion None  Danger to Self  Current suicidal ideation? Denies  Danger to Others  Danger to Others None reported or observed   Progress note   D: Pt seen in dayroom. Pt denies SI, HI, AVH. Pt rates pain  0/10. Pt rates anxiety  0/10 and depression  0/10. Pt reports good appetite, sleep and group attendance. Pt states that one good thing about his day today is that he learned something new. Pt denies any withdrawal symptoms. No other complaints noted.  A: Pt provided support and encouragement. Pt given scheduled medication as prescribed. PRNs as appropriate. Q15 min checks for safety.   R: Pt safe on the unit. Will continue to monitor.

## 2022-06-16 NOTE — Progress Notes (Signed)
[facility] MD Progress Note  06/16/2022 12:19 PM Willaim Thomas  MRN:  [id number]  Reason for admission: Mario Thomas is a 21 y.o male with a history of bipolar 1 d/o, ADHD who was involuntarily committed and taken to the [facility] health urgent care [facility]) for making threats to his parents (stated today is the last day that they will live), bizarre behaviors (trying to take a car parked in the garage which is in front of  two other vehicles w/o moving those vehicles), and medication non compliance. Pt was previously hospitalized at this [facility] from 11/09/21 through 11/17/2021 for manic type behaviors. For this hospitalization, pt was transferred to this [facility] for treatment and stabilization of his mood.   24 hr chart review: Pt's chart reviewed, case discussed with his treatment team. BP over the past 24 hrs has been mostly WNL, with occasional elevations in HR, with 116 earlier this morning. Over the past 24 hrs, pt is noted to be cooperative, pleasant, but also religiously preoccupied and anxious. AS per nursing flow sheets, he slept for a total of 7.75 hrs last night. He required Trazodone 50 mg last night for insomnia, and required Atarax 25 mg X 1 dose last night for anxiety. He did not require any agitation protocol medications.  Today's patient assessment note: During this encounter, pt presents with a euthymic mood, and affect is congruent. His attention to personal hygiene and grooming is poor, and he observed to be pacing restlessly in his room, seeming to be anxious. Eye contact remains good, speech is clear & coherent. Thoughts are organized, but with some illogical contents. Pt currently denies SI/HI or paranoia. He continues to be religiously preoccupied on assessment, today. He initially denied auditory hallucinations, but when asked if he is still hearing God's voice, he admitted to [facility] of God's voice yesterday stating to him "one word". He states that this is when he was pacing back and  forth in his room asking God questions. He reports that he asked God what the "one word" means, and states God spoke to him yet again, and stated: "With one word, I can change everything." He also reports that yesterday, he was praying for God to take him places, and when he closed his eyes and opened them, he "saw portions of places that I wanted to be at." He also reports that he felt a burning sensation on his head yesterday, and it quickly disappeared. He states that it was God, and he feels a lot of serenity today because of that.   Pt reports that he had a good sleep quality last night, and is also reporting a good appetite. He denies any current medication related side effects, and denies any EPS type symptoms related to medications, and none observed on assessment. AIMS:0. Pt is again observed to have dry mouth today, and fluids have been encouraged. Nursing staff have provided him with a pitcher and encouragements to bring it for refills as necessary. Haldol 5 mg BID is being added to pt's medication regimen to help with psychosis. EKG obtained on 7/5 with [id number] & "Septal infarct , age undetermined". Will repeat EKG today with the addition of second antipsychotic to medication regimen.   Principal Problem: Bipolar disorder with psychotic features (HCC) Diagnosis: Principal Problem:   Bipolar disorder with psychotic features (HCC) Active Problems:   GAD (generalized anxiety disorder)   Insomnia   Alcohol use  Total Time spent with patient: 30 minutes  Past Psychiatric History: Bipolar  d/o  Past Medical History:  Past Medical History:  Diagnosis Date   Heart murmur    MDD (major depressive disorder), severe (HCC) 08/23/2018   History reviewed. No pertinent surgical history. Family History:  Family History  Problem Relation Age of Onset   Hyperlipidemia Father    Family Psychiatric  History: none, reported Social History:  Social History   Substance and Sexual Activity   Alcohol Use Never     Social History   Substance and Sexual Activity  Drug Use Never    Social History   Socioeconomic History   Marital status: Single    Spouse name: Not on file   Number of children: Not on file   Years of education: Not on file   Highest education level: Not on file  Occupational History   Not on file  Tobacco Use   Smoking status: Never   Smokeless tobacco: Never  Vaping Use   Vaping Use: Never used  Substance and Sexual Activity   Alcohol use: Never   Drug use: Never   Sexual activity: Never  Other Topics Concern   Not on file  Social History Narrative   01/23/21   From: the area   Living: with parents   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, 1 sibling      Enjoys: running      Exercise: daily running    Diet: meat, veggies, grains      Safety   Seat belts: Yes    Guns: No   Safe in relationships: Yes    Social Determinants of Corporate investment banker Strain: Not on file  Food Insecurity: Not on file  Transportation Needs: Not on file  Physical Activity: Not on file  Stress: Not on file  Social Connections: Not on file   Additional Social History:   Sleep: Good  Appetite:  Good  Current Medications: Current Facility-Administered Medications  Medication Dose Route Frequency Provider Last Rate Last Admin   acetaminophen (TYLENOL) tablet 650 mg  650 mg Oral Q6H PRN Princess Bruins, DO       alum & mag hydroxide-simeth (MAALOX/MYLANTA) 200-200-20 MG/5ML suspension 30 mL  30 mL Oral Q4H PRN Princess Bruins, DO       benztropine (COGENTIN) tablet 1 mg  1 mg Oral BID Princess Bruins, DO   1 mg at 06/16/22 0817   haloperidol (HALDOL) tablet 5 mg  5 mg Oral BID Starleen Blue, NP   5 mg at 06/16/22 1154   hydrOXYzine (ATARAX) tablet 50 mg  50 mg Oral Q4H PRN Princess Bruins, DO   50 mg at 06/15/22 2030   OLANZapine zydis (ZYPREXA) disintegrating tablet 5 mg  5 mg Oral Q8H PRN  Starleen Blue, NP       And   LORazepam (ATIVAN) tablet 1 mg  1 mg Oral PRN Starleen Blue, NP       And   ziprasidone (GEODON) injection 20 mg  20 mg Intramuscular PRN Poonam Woehrle, NP       LORazepam (ATIVAN) tablet 1 mg  1 mg Oral Q6H PRN Kynzee Devinney, NP       magnesium hydroxide (MILK OF MAGNESIA) suspension 30 mL  30 mL Oral Daily PRN Princess Bruins, DO       paliperidone (INVEGA) 24 hr tablet 12 mg  12 mg Oral QHS Starleen Blue, NP       traZODone (DESYREL)  tablet 50 mg  50 mg Oral QHS PRN Sindy Guadeloupe, NP   50 mg at 06/15/22 2030   Lab Results:  No results found for this or any previous visit (from the past 48 hour(s)).  Blood Alcohol level:  Lab Results  Component Value Date   ETH <10 11/08/2021   ETH <10 08/07/2019   Metabolic Disorder Labs: Lab Results  Component Value Date   HGBA1C 4.9 05/28/2022   MPG 93.93 05/28/2022   MPG 96.8 11/15/2021   Lab Results  Component Value Date   PROLACTIN 10.0 08/24/2018   Lab Results  Component Value Date   CHOL 136 05/28/2022   TRIG 246 (H) 05/28/2022   HDL 46 05/28/2022   CHOLHDL 3.0 05/28/2022   VLDL 49 (H) 05/28/2022   LDLCALC 41 05/28/2022   LDLCALC 41 11/15/2021   Physical Findings: AIMS: Facial and Oral Movements Muscles of Facial Expression: None, normal Lips and Perioral Area: None, normal Jaw: None, normal Tongue: None, normal,Extremity Movements Upper (arms, wrists, hands, fingers): None, normal Lower (legs, knees, ankles, toes): None, normal, Trunk Movements Neck, shoulders, hips: None, normal, Overall Severity Severity of abnormal movements (highest score from questions above): None, normal Incapacitation due to abnormal movements: None, normal Patient's awareness of abnormal movements (rate only patient's report): No Awareness, Dental Status Current problems with teeth and/or dentures?: No Does patient usually wear dentures?: No  CIWA:  CIWA-Ar Total: 0 COWS:    AIMS:0 Musculoskeletal: Strength  & Muscle Tone: within normal limits Gait & Station: normal Patient leans: N/A  Psychiatric Specialty Exam:  Presentation  General Appearance: Disheveled  Eye Contact:Good  Speech:Clear and Coherent  Speech Volume:Normal  Handedness:Right  Mood and Affect  Mood:Euthymic  Affect:Appropriate  Thought Process  Thought Processes:Coherent  Descriptions of Associations:Intact  Orientation:Full (Time, Place and Person)  Thought Content:Illogical  History of Schizophrenia/Schizoaffective disorder:No  Duration of Psychotic Symptoms:Greater than six months  Hallucinations:Hallucinations: Auditory; Visual Description of Auditory Hallucinations: God's voice saying "one word" Description of Visual Hallucinations: God shows hims "portions of places that I want to be at"  Ideas of Reference:Delusions  Suicidal Thoughts:Suicidal Thoughts: No  Homicidal Thoughts:Homicidal Thoughts: No  Sensorium  Memory:Immediate Good  Judgment:Poor  Insight:Poor  Executive Functions  Concentration:Fair  Attention Span:Fair  Recall:Fair  Fund of Knowledge:Fair  Language:Good  Psychomotor Activity  Psychomotor Activity:Psychomotor Activity: Normal  Assets  Assets:Communication Skills; Housing; Social Support  Sleep  Sleep:Sleep: Good  Physical Exam: Physical Exam Constitutional:      Appearance: Normal appearance.  HENT:     Head: Normocephalic.     Nose: Nose normal.  Eyes:     Pupils: Pupils are equal, round, and reactive to light.  Musculoskeletal:        General: Normal range of motion.     Cervical back: Normal range of motion.  Neurological:     Mental Status: He is alert and oriented to person, place, and time.     Sensory: No sensory deficit.     Coordination: Coordination normal.  Psychiatric:        Behavior: Behavior normal.        Thought Content: Thought content normal.    Review of Systems  Constitutional: Negative.  Negative for fever.  HENT:  Negative.    Skin: Negative.   Neurological: Negative.   Psychiatric/Behavioral:  Positive for hallucinations. Negative for depression, memory loss, substance abuse and suicidal ideas. The patient is nervous/anxious. The patient does not have insomnia.    Blood pressure 128/65,  pulse (!) 116, temperature 97.6 F (36.4 C), temperature source Oral, resp. rate 17, height 5\' 11"  (1.803 m), weight 101 kg, SpO2 100 %. Body mass index is 31.06 kg/m.  Treatment Plan Summary: Daily contact with patient to assess and evaluate symptoms and progress in treatment and Medication management   Observation Level/Precautions:  15 minute checks  Laboratory:  Labs reviewed   Psychotherapy:  Unit Group sessions  Medications:  See [facility]  Consultations:  To be determined   Discharge Concerns:  Safety, medication compliance, mood stability  Estimated LOS: 5-7 days  Other:  N/A    PLAN Safety and Monitoring: Voluntary admission to inpatient psychiatric unit for safety, stabilization and treatment Daily contact with patient to assess and evaluate symptoms and progress in treatment Patient's case to be discussed in multi-disciplinary team meeting Observation Level : q15 minute checks Vital signs: q12 hours Precautions: Safety                            Long Term Goal(s): Improvement in symptoms so as ready for discharge   Short Term Goals: Ability to identify changes in lifestyle to reduce recurrence of condition will improve, Ability to disclose and discuss suicidal ideas, Ability to demonstrate self-control will improve, and Ability to maintain clinical measurements within normal limits will improve   Diagnoses: Bipolar 1 d/o with psychotic features R/O Schizoaffective d/o Principal Problem:   Bipolar disorder with psychotic features (HCC) Active Problems:   GAD (generalized anxiety disorder)   Insomnia   Alcohol use                           Medications -Start Haldol 5 mg BID for psychosis (starting  7/8) -Continue Invega to 12 mg nightly  -Continue Trazodone 50 mg nightly PRN for insomnia -Continue Cogentin 1 mg BID for EPS prophylaxis -Continue Hydroxyzine 25 mg every 6 hours PRN for anxiety -Continue Ativan 1mg  tabs every 6 hours PRN for CIWA > 10 -Continue Agitation protocol (Zyprexa/Ativan/Geodon PRN)-See MAR   Other PRNS -Continue Tylenol 650 mg every 6 hours PRN for mild pain -Continue Milk of Magnesia as needed every 6 hrs for constipation -Continue Zofran disintegrating tabs every 6 hrs PRN for nausea    Discharge Planning: Social work and case management to assist with discharge planning and identification of hospital follow-up needs prior to discharge Estimated LOS: 5-7 days Discharge Concerns: Need to establish a safety plan; Medication compliance and effectiveness Discharge Goals: Return home with outpatient referrals for mental health follow-up including medication management/psychotherapy  9/8, NP 06/16/2022, 12:19 PMPatient ID: Starleen Blue, male   DOB: [birth year], 20 y.o.   MRN: Romilda Joy Patient ID: Dequavion Follette, male   DOB: 09/25/2001, 21 y.o.   MRN: 09/07/2000 Patient ID: Abyan Cadman, male   DOB: 15-Aug-2001, 21 y.o.   MRN: 09/07/2000

## 2022-06-16 NOTE — Progress Notes (Signed)

## 2022-06-16 NOTE — Progress Notes (Signed)
   06/16/22 0500  Sleep  Number of Hours 7.75

## 2022-06-16 NOTE — Progress Notes (Signed)
The focus of this group is to help patients review their daily goal of treatment and discuss progress on daily workbooks.  Pt attended the evening group and responded to all discussion prompts from the Writer. Pt shared that today was a good day on the unit, the highlight of which was talking to his Mom on the phone, who imparted him with wisdom.  Pt told that, upon feeling stressed at home, he liked to pray as a positive way to relax. "It makes me feel at peace."  Pt rated his day a 7 out of 10 and his affect was appropriate.

## 2022-06-16 NOTE — Group Note (Signed)
  [facility] LCSW Group Therapy Note  Date/Time:  06/16/2022 11:15am-12:00pm  Type of Therapy and Topic:  Group Therapy:  Self-Care after Hospitalization  Participation Level:  Active   Description of Group This process group involved patients discussing how they plan to take care of themselves in a better manner when they get home from the hospital.  The group started with patients listing one healthy and one unhealthy way they took care of themselves prior to hospitalization.  A discussion ensued about the differences in healthy and unhealthy coping skills.  Group members shared ideas about making changes when they return home so that they can stay well and in recovery.  The white board was used to list ideas so that patients can continue to see these ideas throughout the day.  Therapeutic Goals Patient will identify and describe one healthy and one unhealthy coping technique used prior to hospitalization Patient will participate in generating ideas about healthy self-care options when they return to the community Patients will be supportive of one another and receive said support from others Patient will identify one healthy self-care activity to add to his/her post-hospitalization life that can help in recovery  Summary of Patient Progress:  The patient expressed that the thing he most looks forward to about discharging from the hospital is to go back to school and have a better relationship with his family, which he intends to change by ending arguments and apologizing.  He stated he has been in the hospital 5-6 times and he does not want this to happen again.  Patient's participation in group was full.   Patient identified taking his medication, talking to a therapist, praying more, and sees God's goodness as another self-care activity to add in his pursuit of recovery post-discharge.   Therapeutic Modalities Brief Solution-Focused Therapy Motivational  Interviewing Psychoeducation   Ambrose Mantle, LCSW 06/16/2022, 12:00pm

## 2022-06-16 NOTE — [facility] Group Notes (Signed)
Goals Group 78/2023   Group Focus: affirmation, clarity of thought, and goals/reality orientation Treatment Modality:  Psychoeducation Interventions utilized were assignment, group exercise, and support Purpose: To be able to understand and verbalize the reason for their admission to the hospital. To understand that the medication helps with their chemical imbalance but they also need to work on their choices in life. To be challenged to develop a list of 30 positives about themselves. Also introduce the concept that "feelings" are not reality.  Participation Level:  did not attend  Dione Housekeeper

## 2022-06-17 DIAGNOSIS — F319 Bipolar disorder, unspecified: Secondary | ICD-10-CM | POA: Diagnosis not present

## 2022-06-17 MED ORDER — DIPHENHYDRAMINE HCL 50 MG/ML IJ SOLN: 50.0000 mg | Freq: Four times a day (QID) | INTRAMUSCULAR | Status: DC | PRN

## 2022-06-17 MED ORDER — DIPHENHYDRAMINE HCL 25 MG PO CAPS: 50.0000 mg | ORAL_CAPSULE | Freq: Four times a day (QID) | ORAL | Status: DC | PRN

## 2022-06-17 MED ORDER — DIPHENHYDRAMINE HCL 25 MG PO CAPS
50.0000 mg | ORAL_CAPSULE | Freq: Once | ORAL | Status: AC
  Administered 2022-06-17: 50 mg via ORAL
  Filled 2022-06-17: qty 2

## 2022-06-17 MED ORDER — DIPHENHYDRAMINE HCL 25 MG PO CAPS
50.0000 mg | ORAL_CAPSULE | Freq: Once | ORAL | Status: DC
Start: 2022-06-17 — End: 2022-06-17
  Filled 2022-06-17: qty 2

## 2022-06-17 MED ORDER — DIPHENHYDRAMINE HCL 50 MG PO CAPS
50.0000 mg | ORAL_CAPSULE | Freq: Once | ORAL | Status: DC
  Filled 2022-06-17: qty 1

## 2022-06-17 NOTE — Plan of Care (Addendum)
Notified by nursing that patient c/o dizziness and movement of his tongue that started around supper tonight. Vitals reviewed and BP stable sitting to standing and HR 68 sitting and up to 92 standing. Will encourage po fluids.   He c/o tongue feeling as if moved to one side earlier - now resolved. On exam he does not have involuntary tongue movements, tongue is midline on exam, no grimacing, no involuntary movements of face/neck/extremities, no cogwheeling, no tremor and no arm stiffness. He has normal speech and patent airway.   Will give Benadryl 50mg  po X1 and stop Haldol. Will hold Invega until we see how he is doing in terms of any EPS/TD symptoms. He has PRNs for agitation.  , MD, Bartholomew Crews

## 2022-06-17 NOTE — Group Note (Signed)
[facility] LCSW Group Therapy Note  Date/Time:  06/17/2022 10:00am-11:00am  Type of Therapy and Topic:  Group Therapy:  Healthy and Unhealthy Supports  Participation Level:  Active   Description of Group:  Patients in this group were introduced to the idea of adding a variety of healthy supports to address the various needs in their lives, especially in reference to their plans and focus for the new year.  Patients discussed what additional healthy supports could be helpful in their recovery and wellness after discharge in order to prevent future hospitalizations such as counselor, doctor, other levels of psychiatric care such as ACTT services, therapy groups, 12-step groups, and problem-specific support groups.  A demonstration was given about how to set boundaries which patients expressed was beneficial.  Several songs were played to inspire patients to be more self-supportive.  Therapeutic Goals:   1)  discuss importance of adding supports to stay well once out of the hospital  2)  compare healthy versus unhealthy supports and identify some examples of each  3)  generate ideas and descriptions of healthy supports that can be added  4)  offer mutual support about how to address unhealthy supports  5)  encourage active participation in and adherence to discharge plan    Summary of Patient Progress:  The patient stated that current healthy supports in his life are "everyone" because "nobody can be a detriment to you."  The patient expressed that when he tries to filter the people he relates to, he realizes he is wrong.  "I'm not the same person."  He was religiously focused, but not as intrusive as yesterday.   Therapeutic Modalities:   Motivational Interviewing Brief Solution-Focused Therapy  Ambrose Mantle, LCSW

## 2022-06-17 NOTE — Progress Notes (Signed)
Adult Psychoeducational Group Note  Date:  06/17/2022 Time:  9:03 PM  Group Topic/Focus:  Wrap-Up Group:   The focus of this group is to help patients review their daily goal of treatment and discuss progress on daily workbooks.  Participation Level:  Active  Participation Quality:  Appropriate  Affect:  Appropriate  Cognitive:  Appropriate  Insight: Appropriate  Engagement in Group:  Developing/Improving  Modes of Intervention:  Discussion  Additional Comments:  Pt stated his goal for today was to focus on his treatment plan. Pt stated he accomplished his goal today. Pt stated he talked with his doctor and social worker about his care today. Pt rated his overall day a 9 out of 10. Pt stated his father coming for visitation tonight improved his overall day. Pt stated he felt better about himself today. Pt stated he was able to attend all meals. Pt stated he took all medications provided today. Pt stated he attend all groups held today. Pt stated his appetite was pretty good today. Pt rated sleep last night was pretty good. Pt stated the goal tonight was to get some rest. Pt stated he had no physical pain tonight. Pt deny visual hallucinations and auditory issues tonight. Pt denies thoughts of harming himself or others. Pt stated he would alert staff if anything changed  Felipa Furnace 06/17/2022, 9:03 PM

## 2022-06-17 NOTE — [facility] Group Notes (Signed)
Adult Psychoeducational Group Note Date:  06/17/2022 Time:  0900-1045 Group Topic/Focus: PROGRESSIVE RELAXATION. A group where deep breathing is taught and tensing and relaxation muscle groups is used. Imagery is used as well.  Pts are asked to imagine 3 pillars that hold them up when they are not able to hold themselves up and to share that with the group.  Participation Level:  Active  Participation Quality:  Appropriate  Affect:  Appropriate  Cognitive:  Oriented  Insight: Improving  Engagement in Group:  Engaged  Modes of Intervention:  Activity, Discussion, Education, and Support  Additional Comments:  Rates his energy at a 8.5. States, "people and God hold me up."  Dione Housekeeper

## 2022-06-17 NOTE — Progress Notes (Signed)
Patient denies SI, HI, and AVH . Patient complained of feeling dizzy and tongue moving and him unable to control it. Vital signs obtained patient and provider contacted.   Assess patient for safety, offer medications as prescribed, engage patient in 1:1 staff talks  Patient able to contract for safety.

## 2022-06-17 NOTE — Progress Notes (Signed)
[facility] MD Progress Note  06/17/2022 1:16 PM Rivan Siordia  MRN:  [id number]  Reason for admission: Mario Thomas is a 21 y.o male with a history of bipolar 1 d/o, ADHD who was involuntarily committed and taken to the [employer] health urgent care [facility]) for making threats to his parents (stated today is the last day that they will live), bizarre behaviors (trying to take a car parked in the garage which is in front of  two other vehicles w/o moving those vehicles), and medication non compliance. Pt was previously hospitalized at this [facility] from 11/09/21 through 11/17/2021 for manic type behaviors. For this hospitalization, pt was transferred to this [facility] for treatment and stabilization of his mood.   24 hr chart review: Pt's chart reviewed, case discussed with his treatment team. BP over the past 24 hrs has been mostly WNL, with occasional elevations in HR, with 116 earlier this morning. Over the past 24 hrs, pt is noted to be cooperative, pleasant, and has attended most unit group sessions. As per nursing flow sheets, he slept for a total of 7.25 hrs last night. He required Trazodone 50 mg last night for insomnia, and required Atarax 50 mg X 1 dose last night for anxiety. He did not require any agitation protocol medications.  Today's patient assessment note: During this encounter, pt presents with a euthymic mood, and affect is congruent. His attention to personal hygiene and grooming is fair today, and pt is less anxious.  Eye contact is good, speech is clear & coherent. Thoughts are organized & logical today. Pt currently denies SI/HI/AVH or paranoia. Today, there is no evidence of delusional thinking. Pt asked specifically if he is hearing God's voice, and states "no".  Pt reports feeling good today and states that he spoke with his mother yesterday, and she is supposed to come visit him tonight during visitation hours. He reports that his mother wants him to come back home as long as he continues  taking his medications and going to school. Social work to call mother to inquire if this accurate as mother had previously been considering for pt not to return home. She had also been considering pursuing pursuing guardianship of pt.  Pt reports a good sleep quality last night, and continues to report a good appetite. He denies any current medication related side effects, and continues to deny any EPS type symptoms related to medications, and none observed on assessment. AIMS:0. Pt is again observed to have dry mouth today, and fluids are continuing to be encouraged. Haldol 5 mg BID was added to pt's medication regimen yesterday to help with psychosis, and there is some improvement on both medications, as pt currently denies psychosis. EKG repeated yesterday due to the addition of the Haldol to medication regimen. It was WNL with QTC of 437. Pt denies being in any physical distress today. Ativan with CIWA protocol discontinued as pt has not required this medication in the time that he has been here, and there has been no signs/symptoms of withdrawals related to alcohol use. Will continue medications as listed below.  Principal Problem: Bipolar disorder with psychotic features (HCC) Diagnosis: Principal Problem:   Bipolar disorder with psychotic features (HCC) Active Problems:   GAD (generalized anxiety disorder)   Insomnia   Alcohol use  Total Time spent with patient: 30 minutes  Past Psychiatric History: Bipolar d/o  Past Medical History:  Past Medical History:  Diagnosis Date   Heart murmur    MDD (major  depressive disorder), severe (HCC) 08/23/2018   History reviewed. No pertinent surgical history. Family History:  Family History  Problem Relation Age of Onset   Hyperlipidemia Father    Family Psychiatric  History: none, reported Social History:  Social History   Substance and Sexual Activity  Alcohol Use Never     Social History   Substance and Sexual Activity  Drug Use  Never    Social History   Socioeconomic History   Marital status: Single    Spouse name: Not on file   Number of children: Not on file   Years of education: Not on file   Highest education level: Not on file  Occupational History   Not on file  Tobacco Use   Smoking status: Never   Smokeless tobacco: Never  Vaping Use   Vaping Use: Never used  Substance and Sexual Activity   Alcohol use: Never   Drug use: Never   Sexual activity: Never  Other Topics Concern   Not on file  Social History Narrative   01/23/21   From: the area   Living: with parents   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, 1 sibling      Enjoys: running      Exercise: daily running    Diet: meat, veggies, grains      Safety   Seat belts: Yes    Guns: No   Safe in relationships: Yes    Social Determinants of Corporate investment banker Strain: Not on file  Food Insecurity: Not on file  Transportation Needs: Not on file  Physical Activity: Not on file  Stress: Not on file  Social Connections: Not on file   Additional Social History:   Sleep: Good  Appetite:  Good  Current Medications: Current Facility-Administered Medications  Medication Dose Route Frequency Provider Last Rate Last Admin   acetaminophen (TYLENOL) tablet 650 mg  650 mg Oral Q6H PRN Princess Bruins, DO       alum & mag hydroxide-simeth (MAALOX/MYLANTA) 200-200-20 MG/5ML suspension 30 mL  30 mL Oral Q4H PRN Princess Bruins, DO       benztropine (COGENTIN) tablet 1 mg  1 mg Oral BID Princess Bruins, DO   1 mg at 06/17/22 0730   haloperidol (HALDOL) tablet 5 mg  5 mg Oral BID Starleen Blue, NP   5 mg at 06/17/22 0731   hydrOXYzine (ATARAX) tablet 50 mg  50 mg Oral Q4H PRN Princess Bruins, DO   50 mg at 06/16/22 2034   OLANZapine zydis (ZYPREXA) disintegrating tablet 5 mg  5 mg Oral Q8H PRN Starleen Blue, NP       And   LORazepam (ATIVAN) tablet 1 mg  1 mg Oral PRN Starleen Blue, NP       And   ziprasidone (GEODON) injection 20 mg  20 mg Intramuscular PRN Starleen Blue, NP       magnesium hydroxide (MILK OF MAGNESIA) suspension 30 mL  30 mL Oral Daily PRN Princess Bruins, DO       paliperidone (INVEGA) 24 hr tablet 12 mg  12 mg Oral QHS Ferlando Lia, NP   12 mg at 06/16/22 1948   traZODone (DESYREL) tablet 50 mg  50 mg Oral QHS PRN Sindy Guadeloupe, NP   50 mg at 06/16/22 2034   Lab Results:  No results found for this or any previous visit (from the past 48 hour(s)).  Blood Alcohol level:  Lab Results  Component Value Date   ETH <10 11/08/2021   ETH <10 [id number]   Metabolic Disorder Labs: Lab Results  Component Value Date   HGBA1C 4.9 05/28/2022   MPG 93.93 05/28/2022   MPG 96.8 11/15/2021   Lab Results  Component Value Date   PROLACTIN 10.0 08/24/2018   Lab Results  Component Value Date   CHOL 136 05/28/2022   TRIG 246 (H) 05/28/2022   HDL 46 05/28/2022   CHOLHDL 3.0 05/28/2022   VLDL 49 (H) 05/28/2022   LDLCALC 41 05/28/2022   LDLCALC 41 11/15/2021   Physical Findings: AIMS: Facial and Oral Movements Muscles of Facial Expression: None, normal Lips and Perioral Area: None, normal Jaw: None, normal Tongue: None, normal,Extremity Movements Upper (arms, wrists, hands, fingers): None, normal Lower (legs, knees, ankles, toes): None, normal, Trunk Movements Neck, shoulders, hips: None, normal, Overall Severity Severity of abnormal movements (highest score from questions above): None, normal Incapacitation due to abnormal movements: None, normal Patient's awareness of abnormal movements (rate only patient's report): No Awareness, Dental Status Current problems with teeth and/or dentures?: No Does patient usually wear dentures?: No  CIWA:  CIWA-Ar Total: 0 COWS:    AIMS:0 Musculoskeletal: Strength & Muscle Tone: within normal limits Gait & Station: normal Patient leans: N/A  Psychiatric Specialty Exam:  Presentation  General  Appearance: Appropriate for Environment; Fairly Groomed  Eye Contact:Good  Speech:Clear and Coherent  Speech Volume:Normal  Handedness:Right  Mood and Affect  Mood:Euthymic  Affect:Congruent  Thought Process  Thought Processes:Coherent  Descriptions of Associations:Intact  Orientation:Full (Time, Place and Person)  Thought Content:Logical  History of Schizophrenia/Schizoaffective disorder:No  Duration of Psychotic Symptoms:Greater than six months  Hallucinations:Hallucinations: None Description of Auditory Hallucinations: none today Description of Visual Hallucinations: none today  Ideas of Reference:None  Suicidal Thoughts:Suicidal Thoughts: No  Homicidal Thoughts:Homicidal Thoughts: No  Sensorium  Memory:Immediate Good  Judgment:Fair  Insight:Fair  Executive Functions  Concentration:Fair  Attention Span:Fair  [place]  Psychomotor Activity  Psychomotor Activity:Psychomotor Activity: Normal  Assets  Assets:Communication Skills  Sleep  Sleep:Sleep: Good  Physical Exam: Physical Exam Constitutional:      Appearance: Normal appearance.  HENT:     Head: Normocephalic.     Nose: Nose normal.  Eyes:     Pupils: Pupils are equal, round, and reactive to light.  Musculoskeletal:        General: Normal range of motion.     Cervical back: Normal range of motion.  Neurological:     Mental Status: He is alert and oriented to person, place, and time.     Sensory: No sensory deficit.     Coordination: Coordination normal.  Psychiatric:        Behavior: Behavior normal.        Thought Content: Thought content normal.    Review of Systems  Constitutional: Negative.  Negative for fever.  HENT: Negative.    Skin: Negative.   Neurological: Negative.   Psychiatric/Behavioral:  Negative for depression, hallucinations, memory loss, substance abuse and suicidal ideas. The patient is nervous/anxious. The  patient does not have insomnia.    Blood pressure 123/67, pulse 98, temperature 97.8 F (36.6 C), temperature source Oral, resp. rate 17, height 5\' 11"  (1.803 m), weight 101 kg, SpO2 100 %. Body mass index is 31.06 kg/m.  Treatment Plan Summary: Daily contact with patient to assess and evaluate symptoms and progress in treatment and Medication management   Observation Level/Precautions:  15 minute checks  Laboratory:  Labs reviewed   Psychotherapy:  Unit Group sessions  Medications:  See [facility]  Consultations:  To be determined   Discharge Concerns:  Safety, medication compliance, mood stability  Estimated LOS: 5-7 days  Other:  N/A    PLAN Safety and Monitoring: Voluntary admission to inpatient psychiatric unit for safety, stabilization and treatment Daily contact with patient to assess and evaluate symptoms and progress in treatment Patient's case to be discussed in multi-disciplinary team meeting Observation Level : q15 minute checks Vital signs: q12 hours Precautions: Safety                            Long Term Goal(s): Improvement in symptoms so as ready for discharge   Short Term Goals: Ability to identify changes in lifestyle to reduce recurrence of condition will improve, Ability to disclose and discuss suicidal ideas, Ability to demonstrate self-control will improve, and Ability to maintain clinical measurements within normal limits will improve   Diagnoses: Bipolar 1 d/o with psychotic features R/O Schizoaffective d/o Principal Problem:   Bipolar disorder with psychotic features ([place]) Active Problems:   GAD (generalized anxiety disorder)   Insomnia   Alcohol use                           Medications -Continue Haldol 5 mg BID for psychosis (started 7/8) -Continue Invega to 12 mg nightly  -Continue Trazodone 50 mg nightly PRN for insomnia -Continue Cogentin 1 mg BID for EPS prophylaxis -Continue Hydroxyzine 25 mg every 6 hours PRN for anxiety -Discontinue Ativan 1mg   tabs every 6 hours PRN for CIWA > 10 -Continue Agitation protocol (Zyprexa/Ativan/Geodon PRN)-See MAR   Other PRNS -Continue Tylenol 650 mg every 6 hours PRN for mild pain -Continue Milk of Magnesia as needed every 6 hrs for constipation -Continue Zofran disintegrating tabs every 6 hrs PRN for nausea    Discharge Planning: Social work and case management to assist with discharge planning and identification of hospital follow-up needs prior to discharge Estimated LOS: 5-7 days Discharge Concerns: Need to establish a safety plan; Medication compliance and effectiveness Discharge Goals: Return home with outpatient referrals for mental health follow-up including medication management/psychotherapy  Nicholes Rough, NP 06/17/2022, 1:16 PMPatient ID: Shella Spearing, male   DOB: 2001/06/02, 21 y.o.   MRN: [id number] Patient ID: Zyren Rosenbaum, male   DOB: 08/02/2001, 21 y.o.   MRN:

## 2022-06-18 ENCOUNTER — Encounter (HOSPITAL_COMMUNITY): Payer: Self-pay

## 2022-06-18 DIAGNOSIS — F319 Bipolar disorder, unspecified: Secondary | ICD-10-CM | POA: Diagnosis not present

## 2022-06-18 MED ORDER — PALIPERIDONE ER 6 MG PO TB24
12.0000 mg | ORAL_TABLET | Freq: Every day | ORAL | Status: DC
  Administered 2022-06-18 – 2022-06-19 (×2): 12 mg via ORAL
  Filled 2022-06-18 (×4): qty 2

## 2022-06-18 NOTE — Progress Notes (Signed)
    06/18/22 2101  Psych Admission Type (Psych Patients Only)  Admission Status Involuntary  Psychosocial Assessment  Patient Complaints Insomnia  Eye Contact Fair  Facial Expression Flat  Affect Flat  Speech Logical/coherent  Interaction Assertive  Motor Activity Other (Comment) (Unremarkable)  Appearance/Hygiene Unremarkable  Behavior Characteristics Cooperative;Appropriate to situation  Mood Pleasant;Euthymic  Thought Process  Coherency WDL  Content WDL  Delusions None reported or observed  Perception WDL  Hallucination None reported or observed  Judgment Impaired  Confusion None  Danger to Self  Current suicidal ideation? Denies  Danger to Others  Danger to Others None reported or observed

## 2022-06-18 NOTE — Group Note (Signed)
Recreation Therapy Group Note   Group Topic:Coping Skills  Group Date: 06/18/2022 Start Time: 0955 End Time: 1030 Facilitators: Caroll Rancher, LRT,CTRS Location: [address] Dayroom   Goal Area(s) Addresses:  Patient will identify positive coping skills. Patient will identify benefits of using coping skills post d/c.  Group Description:  Mind Map.  Patient was provided a blank template of a diagram with 32 blank boxes in a tiered system, branching from the center (similar to a bubble chart). LRT directed patients to label the middle of the diagram "Coping Skills".  LRT and patients then identified 8 different challenges (anxiety, depression, setting boundaries, relationships, attitude, self esteem, self respect and communication) in which coping skills could be used.  Patients were then given 20 minutes the come up with 3 coping skills for each challenge identified.  LRT would then write the coping skills the patients came up with on the board so patients could fill in any blank spots they may have had on their sheets.   Affect/Mood: Appropriate   Participation Level: Engaged   Participation Quality: Independent   Behavior: Appropriate   Speech/Thought Process: Focused   Insight: Good   Judgement: Good   Modes of Intervention: Worksheet   Patient Response to Interventions:  Engaged   Education Outcome:  Acknowledges education and In group clarification offered    Clinical Observations/Individualized Feedback: Pt was more reserved but still engaged.  Pt defined coping skills as "something you use to adjust to deal with different situations".  Pt identified listening to music as coping skill.  During activity, some of the coping skills pt identified were stress ball, have a community, build confidence, invest in emotional programs, invest in self and listen to upbeat music.     Plan: Continue to engage patient in RT group sessions 2-3x/week.   Caroll Rancher, LRT,CTRS   06/18/2022 1:06 PM

## 2022-06-18 NOTE — Progress Notes (Signed)
Pt reports he had a good visit with his dad. Pt seen talking on the phone but mainly stayed in his room. Pt rates depression 0/10 and anxiety 0/10. Pt reports a good appetite, and no physical problems. Pt denies SI/HI/AVH and verbally contracts for safety. Provided support and encouragement. Pt safe on the unit. Q 15 minute safety checks continued.

## 2022-06-18 NOTE — Progress Notes (Signed)
[name] MD Progress Note  06/18/2022 3:26 PM Mario Thomas  MRN:  [id number]  Reason for admission: Mario Thomas is a 21 y.o male with a history of bipolar 1 d/o, ADHD who was involuntarily committed and taken to the [facility] health urgent care [facility]) for making threats to his parents (stated today is the last day that they will live), bizarre behaviors (trying to take a car parked in the garage which is in front of  two other vehicles w/o moving those vehicles), and medication non compliance. Pt was previously hospitalized at this [facility] from 11/09/21 through 11/17/2021 for manic type behaviors. For this hospitalization, pt was transferred to this [facility] for treatment and stabilization of his mood.   24 hr chart review: Pt's chart reviewed, case discussed with his treatment team. BP over the past 24 hrs has been WNL (see flowsheets).  Over the past 24 hrs, pt is noted to be continuing to be cooperative, pleasant, and has attended most unit group sessions. As per nursing flow sheets, he slept for a total of 8.25 hrs last night. He required Trazodone 50 mg last night for insomnia. Yesterday evening, attending Physician was notified by nursing that pt was reporting dizziness, along with inability to control movements of his tongue. Pt was assessed by attending Physician (see note on 7/9 @ 6:51 pm). Here is an excerpt from that note:   "He c/o tongue feeling as if moved to one side earlier - now resolved. On exam he does not have involuntary tongue movements, tongue is midline on exam, no grimacing, no involuntary movements of face/neck/extremities, no cogwheeling, no tremor and no arm stiffness. He has normal speech and patent airway. Will give Benadryl 50mg  po X1 and stop Haldol. Will hold Invega until we see how he is doing in terms of any EPS/TD symptoms." (Dr. , 06/17/2022)  Today's patient assessment note: During this encounter, pt is seen in his room along with attending psychiatrist. He  denies any EPS/TD type symptoms today. He presents with a euthymic mood, and affect is congruent. His attention to personal hygiene and grooming is fair.  Eye contact continues to be good, speech is clear & coherent. Thoughts are organized & logical today. Pt continues to deny SI/HI/AVH or paranoia. Today, there is no evidence of delusional thinking. Pt asked when the last time that he heard the voice of God was, and states that he hasn't heard it "in a day or two."  Pt reports that he visited with his parents last night, and that his parents are willing for him to come back home after this hospitalization. CSW has been informed to contact parents to make sure that this information is accurate. She states that she will reach out to them tomorrow.  Pt reports a good sleep quality last night, and continues to report a good appetite. He denies any current medication related side effects, and continues denies any EPS type symptoms related to medications, and none observed or found on assessment. AIMS today:0. Pt did not have any EPS/TD symptoms while on Invega alone, and it is most likely that the dystonic reaction that he had yesterday evening was most likely related to the addition of Haldol to his medication regimen. Haldol was discontinued last night, along with Invega. Pt is agreeable to Invega being added back to his medication regimen at previous dose of 12 mg with dosing in the daytime so that he can be monitored. Will continue other medications as listed below.  Principal Problem: Bipolar disorder with psychotic features (HCC) Diagnosis: Principal Problem:   Bipolar disorder with psychotic features (HCC) Active Problems:   GAD (generalized anxiety disorder)   Insomnia   Alcohol use  Total Time spent with patient: 30 minutes  Past Psychiatric History: Bipolar d/o  Past Medical History:  Past Medical History:  Diagnosis Date   Heart murmur    MDD (major depressive disorder), severe (HCC)  08/23/2018   History reviewed. No pertinent surgical history. Family History:  Family History  Problem Relation Age of Onset   Hyperlipidemia Father    Family Psychiatric  History: none, reported Social History:  Social History   Substance and Sexual Activity  Alcohol Use Never     Social History   Substance and Sexual Activity  Drug Use Never    Social History   Socioeconomic History   Marital status: Single    Spouse name: Not on file   Number of children: Not on file   Years of education: Not on file   Highest education level: Not on file  Occupational History   Not on file  Tobacco Use   Smoking status: Never   Smokeless tobacco: Never  Vaping Use   Vaping Use: Never used  Substance and Sexual Activity   Alcohol use: Never   Drug use: Never   Sexual activity: Never  Other Topics Concern   Not on file  Social History Narrative   01/23/21   From: the area   Living: with parents   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, 1 sibling      Enjoys: running      Exercise: daily running    Diet: meat, veggies, grains      Safety   Seat belts: Yes    Guns: No   Safe in relationships: Yes    Social Determinants of Corporate investment banker Strain: Not on file  Food Insecurity: Not on file  Transportation Needs: Not on file  Physical Activity: Not on file  Stress: Not on file  Social Connections: Not on file   Additional Social History:   Sleep: Good  Appetite:  Good  Current Medications: Current Facility-Administered Medications  Medication Dose Route Frequency Provider Last Rate Last Admin   acetaminophen (TYLENOL) tablet 650 mg  650 mg Oral Q6H PRN Princess Bruins, DO       alum & mag hydroxide-simeth (MAALOX/MYLANTA) 200-200-20 MG/5ML suspension 30 mL  30 mL Oral Q4H PRN Princess Bruins, DO       benztropine (COGENTIN) tablet 1 mg  1 mg Oral BID Princess Bruins, DO   1 mg at 06/18/22 5027    diphenhydrAMINE (BENADRYL) capsule 50 mg  50 mg Oral Q6H PRN Comer Locket, MD       Or   diphenhydrAMINE (BENADRYL) injection 50 mg  50 mg Intramuscular Q6H PRN Mason Jim, Amy E, MD       OLANZapine zydis (ZYPREXA) disintegrating tablet 5 mg  5 mg Oral Q8H PRN Andrews Tener, NP       And   LORazepam (ATIVAN) tablet 1 mg  1 mg Oral PRN Starleen Blue, NP       And   ziprasidone (GEODON) injection 20 mg  20 mg Intramuscular PRN Amulya Quintin, NP       magnesium hydroxide (MILK OF MAGNESIA) suspension 30 mL  30 mL Oral Daily PRN Princess Bruins, DO  paliperidone (INVEGA) 24 hr tablet 12 mg  12 mg Oral Daily Thyra Yinger, NP   12 mg at 06/18/22 1043   traZODone (DESYREL) tablet 50 mg  50 mg Oral QHS PRN Sindy Guadeloupe, NP   50 mg at 06/17/22 2044   Lab Results:  No results found for this or any previous visit (from the past 48 hour(s)).  Blood Alcohol level:  Lab Results  Component Value Date   ETH <10 11/08/2021   ETH <10 08/07/2019   Metabolic Disorder Labs: Lab Results  Component Value Date   HGBA1C 4.9 05/28/2022   MPG 93.93 05/28/2022   MPG 96.8 11/15/2021   Lab Results  Component Value Date   PROLACTIN 10.0 08/24/2018   Lab Results  Component Value Date   CHOL 136 05/28/2022   TRIG 246 (H) 05/28/2022   HDL 46 05/28/2022   CHOLHDL 3.0 05/28/2022   VLDL 49 (H) 05/28/2022   LDLCALC 41 05/28/2022   LDLCALC 41 11/15/2021   Physical Findings: AIMS: Facial and Oral Movements Muscles of Facial Expression: None, normal Lips and Perioral Area: None, normal Jaw: None, normal Tongue: None, normal,Extremity Movements Upper (arms, wrists, hands, fingers): None, normal Lower (legs, knees, ankles, toes): None, normal, Trunk Movements Neck, shoulders, hips: None, normal, Overall Severity Severity of abnormal movements (highest score from questions above): None, normal Incapacitation due to abnormal movements: None, normal Patient's awareness of abnormal movements  (rate only patient's report): No Awareness, Dental Status Current problems with teeth and/or dentures?: No Does patient usually wear dentures?: No  CIWA:  CIWA-Ar Total: 0 COWS:    AIMS:0 Musculoskeletal: Strength & Muscle Tone: within normal limits Gait & Station: normal Patient leans: N/A  Psychiatric Specialty Exam:  Presentation  General Appearance: Appropriate for Environment; Fairly Groomed  Eye Contact:Good  Speech:Clear and Coherent  Speech Volume:Normal  Handedness:Right  Mood and Affect  Mood:Euthymic  Affect:Appropriate; Congruent  Thought Process  Thought Processes:Coherent  Descriptions of Associations:Intact  Orientation:Full (Time, Place and Person)  Thought Content:Logical  History of Schizophrenia/Schizoaffective disorder:No  Duration of Psychotic Symptoms:Greater than six months  Hallucinations:Hallucinations: None Description of Auditory Hallucinations: none today Description of Visual Hallucinations: none today  Ideas of Reference:None  Suicidal Thoughts:Suicidal Thoughts: No  Homicidal Thoughts:Homicidal Thoughts: No   Sensorium  Memory:Immediate Good  Judgment:Fair  Insight:Fair  Executive Functions  Concentration:Good  Attention Span:Good  Recall:Good  Fund of Knowledge:Good  Language:Good  Psychomotor Activity  Psychomotor Activity:Psychomotor Activity: Normal  Assets  Assets:Communication Skills  Sleep  Sleep:Sleep: Good  Physical Exam: Physical Exam Constitutional:      Appearance: Normal appearance.  HENT:     Head: Normocephalic.     Nose: Nose normal.  Eyes:     Pupils: Pupils are equal, round, and reactive to light.  Musculoskeletal:        General: Normal range of motion.     Cervical back: Normal range of motion.  Neurological:     Mental Status: He is alert and oriented to person, place, and time.     Sensory: No sensory deficit.     Coordination: Coordination normal.  Psychiatric:         Behavior: Behavior normal.        Thought Content: Thought content normal.    Review of Systems  Constitutional: Negative.  Negative for fever.  HENT: Negative.    Skin: Negative.   Neurological: Negative.   Psychiatric/Behavioral:  Negative for depression, hallucinations, memory loss, substance abuse and suicidal ideas. The patient is  nervous/anxious (improving). The patient does not have insomnia.    Blood pressure 122/72, pulse 90, temperature 98.2 F (36.8 C), temperature source Oral, resp. rate 17, height 5\' 11"  (1.803 m), weight 101 kg, SpO2 100 %. Body mass index is 31.06 kg/m.  Treatment Plan Summary: Daily contact with patient to assess and evaluate symptoms and progress in treatment and Medication management   Observation Level/Precautions:  15 minute checks  Laboratory:  Labs reviewed   Psychotherapy:  Unit Group sessions  Medications:  See [facility]  Consultations:  To be determined   Discharge Concerns:  Safety, medication compliance, mood stability  Estimated LOS: 5-7 days  Other:  N/A    PLAN Safety and Monitoring: Voluntary admission to inpatient psychiatric unit for safety, stabilization and treatment Daily contact with patient to assess and evaluate symptoms and progress in treatment Patient's case to be discussed in multi-disciplinary team meeting Observation Level : q15 minute checks Vital signs: q12 hours Precautions: Safety                            Long Term Goal(s): Improvement in symptoms so as ready for discharge   Short Term Goals: Ability to identify changes in lifestyle to reduce recurrence of condition will improve, Ability to disclose and discuss suicidal ideas, Ability to demonstrate self-control will improve, and Ability to maintain clinical measurements within normal limits will improve   Diagnoses: Bipolar 1 d/o with psychotic features R/O Schizoaffective d/o Principal Problem:   Bipolar disorder with psychotic features (HCC) Active Problems:    GAD (generalized anxiety disorder)   Insomnia   Alcohol use                           Medications -Discontinue Haldol 5 mg BID due to dystonia while on it -Restart Invega to 12 mg nightly  -Continue Trazodone 50 mg nightly PRN for insomnia -Continue Cogentin 1 mg BID for EPS prophylaxis -Continue Hydroxyzine 25 mg every 6 hours PRN for anxiety -Discontinued Ativan 1mg  tabs every 6 hours PRN for CIWA > 10 -Continue Agitation protocol (Zyprexa/Ativan/Geodon PRN)-See MAR   Other PRNS -Continue Tylenol 650 mg every 6 hours PRN for mild pain -Continue Milk of Magnesia as needed every 6 hrs for constipation -Continue Zofran disintegrating tabs every 6 hrs PRN for nausea    Discharge Planning: Social work and case management to assist with discharge planning and identification of hospital follow-up needs prior to discharge Estimated LOS: 5-7 days Discharge Concerns: Need to establish a safety plan; Medication compliance and effectiveness Discharge Goals: Return home with outpatient referrals for mental health follow-up including medication management/psychotherapy  [facility], NP 06/18/2022, 3:26 PMPatient ID: Starleen Blue, male   DOB: 2001-08-23, 20 y.o.   MRN: Romilda Joy Patient ID: Deanna Boehlke, male   DOB: 2001-11-20, 21 y.o.   MRN: Patient ID: Kingsley Farace, male   DOB: Sep 28, 2001, 21 y.o.   MRN: 09/07/[id number]

## 2022-06-18 NOTE — Progress Notes (Signed)
   06/18/22 0800  Charting Type  Charting Type Shift assessment  Safety Check Verification  Has the RN verified the 15 minute safety check completion? Yes  Neurological  Neuro (WDL) WDL  HEENT  HEENT (WDL) WDL  Respiratory  Respiratory (WDL) WDL  Cardiac  Cardiac (WDL) WDL  Vascular  Vascular (WDL) WDL  Integumentary  Integumentary (WDL) WDL  Braden Scale (Ages 8 and up)  Sensory Perceptions 4  Moisture 4  Activity 4  Mobility 4  Nutrition 3  Friction and Shear 3  Braden Scale Score 22  Musculoskeletal  Musculoskeletal (WDL) WDL  Assistive Device None  Gastrointestinal  Gastrointestinal (WDL) WDL  GU Assessment  Genitourinary (WDL) WDL  Neurological  Level of Consciousness Alert

## 2022-06-18 NOTE — Progress Notes (Signed)
Adult Psychoeducational Group Note  Date:  06/18/2022 Time:  8:25 PM  Group Topic/Focus:  Wrap-Up Group:   The focus of this group is to help patients review their daily goal of treatment and discuss progress on daily workbooks.  Participation Level:  Active  Participation Quality:  Appropriate  Affect:  Appropriate  Cognitive:  Appropriate  Insight: Appropriate  Engagement in Group:  Developing/Improving  Modes of Intervention:  Discussion  Additional Comments:  Pt stated his goal for today was to focus on his treatment plan and discuss his discharge plan with his doctor. Pt stated he accomplished his goals today. Pt stated he talked with his doctor but did not get to speak with his social worker about his care today. Pt rated his overall day a 9 out of 10. Pt stated the plan is for him to discharge on 06/20/22. Pt stated he felt better about himself today. Pt stated he was able to attend all meals. Pt stated he took all medications provided today. Pt stated he attend all groups held today. Pt stated his appetite was pretty good today. Pt rated sleep last night was pretty good. Pt stated the goal tonight was to get some rest. Pt stated he had no physical pain tonight. Pt deny visual hallucinations and auditory issues tonight. Pt denies thoughts of harming himself or others. Pt stated he would alert staff if anything changed  Mario Thomas 06/18/2022, 8:25 PM

## 2022-06-18 NOTE — Group Note (Signed)
LCSW Group Therapy Note   Group Date: 06/18/2022 Start Time: 1300 End Time: 1400   Type of Therapy and Topic:  Group Therapy: Boundaries  Participation Level:  Active  Description of Group: This group will address the use of boundaries in their personal lives. Patients will explore why boundaries are important, the difference between healthy and unhealthy boundaries, and negative and postive outcomes of different boundaries and will look at how boundaries can be crossed.  Patients will be encouraged to identify current boundaries in their own lives and identify what kind of boundary is being set. Facilitators will guide patients in utilizing problem-solving interventions to address and correct types boundaries being used and to address when no boundary is being used. Understanding and applying boundaries will be explored and addressed for obtaining and maintaining a balanced life. Patients will be encouraged to explore ways to assertively make their boundaries and needs known to significant others in their lives, using other group members and facilitator for role play, support, and feedback.  Therapeutic Goals:  1.  Patient will identify areas in their life where setting clear boundaries could be  used to improve their life.  2.  Patient will identify signs/triggers that a boundary is not being respected. 3.  Patient will identify two ways to set boundaries in order to achieve balance in  their lives: 4.  Patient will demonstrate ability to communicate their needs and set boundaries  through discussion and/or role plays  Summary of Patient Progress:  Laken was present/active throughout the session and proved open to feedback from CSW and peers. Patient demonstrated good insight into the subject matter, was respectful of peers, and was present throughout the entire session.  Therapeutic Modalities:   Cognitive Behavioral Therapy Solution-Focused Therapy  Beatris Si, LCSWA 06/18/2022   2:33 PM

## 2022-06-18 NOTE — BH IP Treatment Plan (Signed)
Interdisciplinary Treatment and Diagnostic Plan Update  06/18/2022 Time of Session: update  Mario Thomas MRN: [id number]  Principal Diagnosis: Bipolar disorder with psychotic features [facility])  Secondary Diagnoses: Principal Problem:   Bipolar disorder with psychotic features (HCC) Active Problems:   GAD (generalized anxiety disorder)   Insomnia   Alcohol use   Current Medications:  Current Facility-Administered Medications  Medication Dose Route Frequency Provider Last Rate Last Admin   acetaminophen (TYLENOL) tablet 650 mg  650 mg Oral Q6H PRN Princess Bruins, DO       alum & mag hydroxide-simeth (MAALOX/MYLANTA) 200-200-20 MG/5ML suspension 30 mL  30 mL Oral Q4H PRN Princess Bruins, DO       benztropine (COGENTIN) tablet 1 mg  1 mg Oral BID Princess Bruins, DO   1 mg at 06/18/22 6269   diphenhydrAMINE (BENADRYL) capsule 50 mg  50 mg Oral Q6H PRN Comer Locket, MD       Or   diphenhydrAMINE (BENADRYL) injection 50 mg  50 mg Intramuscular Q6H PRN Mason Jim, Amy E, MD       OLANZapine zydis (ZYPREXA) disintegrating tablet 5 mg  5 mg Oral Q8H PRN Starleen Blue, NP       And   LORazepam (ATIVAN) tablet 1 mg  1 mg Oral PRN Starleen Blue, NP       And   ziprasidone (GEODON) injection 20 mg  20 mg Intramuscular PRN Starleen Blue, NP       magnesium hydroxide (MILK OF MAGNESIA) suspension 30 mL  30 mL Oral Daily PRN Princess Bruins, DO       paliperidone (INVEGA) 24 hr tablet 12 mg  12 mg Oral Daily Nkwenti, Doris, NP       traZODone (DESYREL) tablet 50 mg  50 mg Oral QHS PRN Sindy Guadeloupe, NP   50 mg at 06/17/22 2044   PTA Medications: Medications Prior to Admission  Medication Sig Dispense Refill Last Dose   benztropine (COGENTIN) 1 MG tablet Take 1 tablet (1 mg total) by mouth 2 (two) times daily.      paliperidone (INVEGA) 6 MG 24 hr tablet Take 1 tablet (6 mg total) by mouth daily.       Patient Stressors: Medication change or noncompliance    Patient Strengths: Physical Health   Supportive family/friends   Treatment Modalities: Medication Management, Group therapy, Case management,  1 to 1 session with clinician, Psychoeducation, Recreational therapy.   Physician Treatment Plan for Primary Diagnosis: Bipolar disorder with psychotic features (HCC) Long Term Goal(s): Improvement in symptoms so as ready for discharge   Short Term Goals: Ability to identify changes in lifestyle to reduce recurrence of condition will improve Ability to disclose and discuss suicidal ideas Ability to demonstrate self-control will improve Ability to maintain clinical measurements within normal limits will improve  Medication Management: Evaluate patient's response, side effects, and tolerance of medication regimen.  Therapeutic Interventions: 1 to 1 sessions, Unit Group sessions and Medication administration.  Evaluation of Outcomes: Progressing  Physician Treatment Plan for Secondary Diagnosis: Principal Problem:   Bipolar disorder with psychotic features (HCC) Active Problems:   GAD (generalized anxiety disorder)   Insomnia   Alcohol use  Long Term Goal(s): Improvement in symptoms so as ready for discharge   Short Term Goals: Ability to identify changes in lifestyle to reduce recurrence of condition will improve Ability to disclose and discuss suicidal ideas Ability to demonstrate self-control will improve Ability to maintain clinical measurements within normal limits will improve  Medication Management: Evaluate patient's response, side effects, and tolerance of medication regimen.  Therapeutic Interventions: 1 to 1 sessions, Unit Group sessions and Medication administration.  Evaluation of Outcomes: Progressing   RN Treatment Plan for Primary Diagnosis: Bipolar disorder with psychotic features (HCC) Long Term Goal(s): Knowledge of disease and therapeutic regimen to maintain health will improve  Short Term Goals: Ability to remain free from injury will improve,  Ability to verbalize frustration and anger appropriately will improve, Ability to demonstrate self-control, Ability to participate in decision making will improve, Ability to verbalize feelings will improve, Ability to disclose and discuss suicidal ideas, Ability to identify and develop effective coping behaviors will improve, and Compliance with prescribed medications will improve  Medication Management: RN will administer medications as ordered by provider, will assess and evaluate patient's response and provide education to patient for prescribed medication. RN will report any adverse and/or side effects to prescribing provider.  Therapeutic Interventions: 1 on 1 counseling sessions, Psychoeducation, Medication administration, Evaluate responses to treatment, Monitor vital signs and CBGs as ordered, Perform/monitor CIWA, COWS, AIMS and Fall Risk screenings as ordered, Perform wound care treatments as ordered.  Evaluation of Outcomes: Progressing   LCSW Treatment Plan for Primary Diagnosis: Bipolar disorder with psychotic features [facility]) Long Term Goal(s): Safe transition to appropriate next level of care at discharge, Engage patient in therapeutic group addressing interpersonal concerns.  Short Term Goals: Engage patient in aftercare planning with referrals and resources, Increase social support, Increase ability to appropriately verbalize feelings, Increase emotional regulation, Facilitate acceptance of mental health diagnosis and concerns, Facilitate patient progression through stages of change regarding substance use diagnoses and concerns, Identify triggers associated with mental health/substance abuse issues, and Increase skills for wellness and recovery  Therapeutic Interventions: Assess for all discharge needs, 1 to 1 time with Social worker, Explore available resources and support systems, Assess for adequacy in community support network, Educate family and significant other(s) on suicide  prevention, Complete Psychosocial Assessment, Interpersonal group therapy.  Evaluation of Outcomes: Progressing   Progress in Treatment: Attending groups: Yes. Participating in groups: Yes. Taking medication as prescribed: Yes. Toleration medication: Yes. Family/Significant other contact made: Yes, individual(s) contacted:  Mother, Meseret Patient understands diagnosis: Yes. Discussing patient identified problems/goals with staff: Yes. Medical problems stabilized or resolved: Yes. Denies suicidal/homicidal ideation: Yes. Issues/concerns per patient self-inventory: No.  New problem(s) identified: No, Describe:  none reported  New Short Term/Long Term Goal(s):   medication stabilization, elimination of SI thoughts, development of comprehensive mental wellness plan.    Patient Goals:  Patient continues to work on "to learn new   Discharge Plan or Barriers: Patient has conflict with family, family has not confirmed if they will let patient stay with him again.    Reason for Continuation of Hospitalization: Anxiety Delusions  Depression Hallucinations Medication stabilization  Estimated Length of Stay: 3-5 days  Last 3 [place] Suicide Severity Risk Score: Flowsheet Row [facility] (Current) [facility] from [facility]09/2022 in [facility] ED from 05/28/2022 in [facility]  C-SSRS RISK CATEGORY No Risk Error: Question 2 not populated Error: Question 2 not populated       Last [facility] 2/9 Scores:    11/27/2019    9:41 AM 08/04/2018    9:03 AM  Depression screen PHQ 2/9  Decreased Interest 0 0  Down, Depressed, Hopeless 0 0  PHQ - 2 Score 0 0    Scribe for Treatment Team:  Chayanne Speir E Satsuki Zillmer, LCSW 06/18/2022 9:57 AM

## 2022-06-18 NOTE — Plan of Care (Signed)
?  Problem: Activity: ?Goal: Interest or engagement in activities will improve ?Outcome: Progressing ?Goal: Sleeping patterns will improve ?Outcome: Progressing ?  ?Problem: Coping: ?Goal: Ability to verbalize frustrations and anger appropriately will improve ?Outcome: Progressing ?Goal: Ability to demonstrate self-control will improve ?Outcome: Progressing ?  ?Problem: Safety: ?Goal: Periods of time without injury will increase ?Outcome: Progressing ?  ?

## 2022-06-19 DIAGNOSIS — F319 Bipolar disorder, unspecified: Secondary | ICD-10-CM | POA: Diagnosis not present

## 2022-06-19 MED ORDER — PALIPERIDONE ER 6 MG PO TB24
12.0000 mg | ORAL_TABLET | Freq: Every day | ORAL | Status: DC
  Administered 2022-06-20: 12 mg via ORAL
  Filled 2022-06-19 (×2): qty 2

## 2022-06-19 NOTE — B[facility] Counselor (Signed)
CSW spoke with mother and mother agreed for patient to return home.  She reports that she can pick him up for discharge tomorrow at 4pm.  CSW addressed questions about follow up and medications that mother had at this time.    Shereena Berquist, LCSW, LCAS Clincal Social Worker  [facility]

## 2022-06-19 NOTE — Progress Notes (Signed)
Adult Psychoeducational Group Note  Date:  06/19/2022 Time:  10:32 PM  Group Topic/Focus:  Wrap-Up Group:   The focus of this group is to help patients review their daily goal of treatment and discuss progress on daily workbooks.  Participation Level:  Active  Participation Quality:  Appropriate  Affect:  Appropriate  Cognitive:  Appropriate  Insight: Appropriate  Engagement in Group:  Developing/Improving  Modes of Intervention:  Discussion  Additional Comments:  Pt stated his goal for today was to focus on his treatment plan and discuss his discharge plan with his doctor. Pt stated he accomplished his goals today. Pt stated he talked with his doctor and with his social worker about his care today. Pt rated his overall day a 10. Pt stated the plan is for him to discharge on 06/20/22. Pt stated he felt better about himself today. Pt stated he was able to attend all meals. Pt stated he took all medications provided today. Pt stated he attend all groups held today. Pt stated his appetite was pretty good today. Pt rated sleep last night was pretty good. Pt stated the goal tonight was to get some rest. Pt stated he had no physical pain tonight. Pt deny visual hallucinations and auditory issues tonight. Pt denies thoughts of harming himself or others. Pt stated he would alert staff if anything changed  Felipa Furnace 06/19/2022, 10:32 PM

## 2022-06-19 NOTE — Progress Notes (Signed)
   06/19/22 1900  Psych Admission Type (Psych Patients Only)  Admission Status Involuntary  Psychosocial Assessment  Patient Complaints None  Eye Contact Fair  Facial Expression Anxious  Affect Appropriate to circumstance  Speech Logical/coherent  Interaction Assertive  Motor Activity Other (Comment) (wdl)  Appearance/Hygiene Unremarkable  Behavior Characteristics Cooperative;Appropriate to situation  Mood Euthymic;Pleasant  Thought Process  Coherency WDL  Content WDL  Delusions None reported or observed  Perception WDL  Hallucination None reported or observed  Judgment WDL  Confusion None  Danger to Self  Current suicidal ideation? Denies  Danger to Others  Danger to Others None reported or observed

## 2022-06-19 NOTE — Group Note (Signed)
Recreation Therapy Group Note   Group Topic:Self-Esteem  Group Date: 06/19/2022 Start Time: 1000 End Time: 1030 Facilitators: Caroll Rancher, LRT,CTRS Location: [address] Dayroom   Goal Area(s) Addresses:  Patient will be able to identify self esteem. Patient will successfully share why it is beneficial to positive self esteem.  Group Description:  Patients and LRT discussed the importance of self esteem and what influences how we see ourselves.  Patients then picked a picture of a blank face of their choosing.  Patients were instructed to draw and describe how they see themselves on the blank face.  On the back of the sheet, patients were to identify how other people perceive them.  Patients were given colored pencils, markers, and crayons to complete the assignment. Patients shared their completed assignment with each other. Writer and group reflected on how it's important to have positive thoughts about oneself because it can impact how you move in the world.   Affect/Mood: Appropriate   Participation Level: Engaged   Participation Quality: Independent   Behavior: Appropriate   Speech/Thought Process: Focused   Insight: Good   Judgement: Good   Modes of Intervention: Art   Patient Response to Interventions:  Engaged   Education Outcome:  Acknowledges education and In group clarification offered    Clinical Observations/Individualized Feedback: Pt explained self esteem was important because "it influences your behavior".  Pt went on to explain that self esteem is influenced by "the signals you get from different sources".  Pt shared people often see him as philosophical and introspective.  Pt shared he sees himself as "ready for change and to improve".  Pt also expressed he is outspoken and philosophical.  Pt expressed he keeps a positive outlook by seeing what he can control and what he can't control.  Pt went on to say "I focus on what I can control and the things I can't,  I let pass and try to learn from them".    Plan: Continue to engage patient in RT group sessions 2-3x/week.   Caroll Rancher, Antonietta Jewel  06/19/2022 12:39 PM

## 2022-06-19 NOTE — Progress Notes (Signed)
Adult Psychoeducational Group Note  Date:  06/19/2022 Time:  5:10 PM  Group Topic/Focus:  Orientation:   The focus of this group is to educate the patient on the purpose and policies of crisis stabilization and provide a format to answer questions about their admission.  The group details unit policies and expectations of patients while admitted.  Participation Level:  Active  Participation Quality:  Appropriate  Affect:  Appropriate  Cognitive:  Appropriate  Insight: Appropriate  Engagement in Group:  Engaged  Modes of Intervention:  Discussion  Additional Comments: Patient attended morning orientation group and participated.  Yaelis Scharfenberg W Addisynn Vassell 06/19/2022, 5:10 PM

## 2022-06-19 NOTE — Progress Notes (Signed)
[facility] MD Progress Note  06/19/2022 4:16 PM Mario Thomas  MRN:  [id number]  Reason for admission: Mario Thomas is a 21 y.o male with a history of bipolar 1 d/o, ADHD who was involuntarily committed and taken to the [employer] [facility] health urgent care [facility]) for making threats to his parents (stated today is the last day that they will live), bizarre behaviors (trying to take a car parked in the garage which is in front of  two other vehicles w/o moving those vehicles), and medication non compliance. Pt was previously hospitalized at this [facility] from 11/09/21 through 11/17/2021 for manic type behaviors. For this hospitalization, pt was transferred to this [facility] for treatment and stabilization of his mood.   24 hr chart review: Pt's chart reviewed, case discussed with his treatment team. BP over the past 24 hrs has been mostly WNL (see flowsheets).  Over the past 24 hrs, pt is noted to be continuing to be cooperative, pleasant, and has attended most unit group sessions. As per nursing flow sheets, he slept for a total of 8.25 hrs last night. He required Trazodone 50 mg last night for insomnia. Mood for the past 24 hrs has been noted to be euthymic.  Today's patient assessment note: During this encounter, pt presents with a euthymic mood & affect is congruent. Attention to personal hygiene and grooming is fair, eye contact is good, speech is clear & coherent. Thought contents are organized and logical, and pt currently denies SI/HI/AVH or paranoia. There is no evidence of delusional thoughts.  Pt denies hearing the voice of God today.  He denies any medication related side effects today, denies any EPS/TD symptoms today and is tolerating taking the Invega at 12 mg daily. Pt checked for tremor/stiffness, but non found on assessment. There are no involuntary movements of any of his extremities. AIMS:0. Will reschedule the Invega to nightly at the current dose due to day time sedation. The rationales & benefits  of an Invega LAI have been explained to pt, but he continues to decline at this time and states that he will continue taking the Invega PO. CSW reached out to pt's mother today to ascertain if she will take pt back home, and she reports that pt is welcome back home, and that she plans to administer pt his medications moving forward. She reported that she plans to pick pt up from this hospital tomorrow at about 4 pm. Pt will be made aware. Pt reports a good appetite and a good sleep quality last night, and denies being in any physical distress. He reports that he had a bowel movement earlier today morning. Will continue current medications at current doses.  Principal Problem: Bipolar disorder with psychotic features (HCC) Diagnosis: Principal Problem:   Bipolar disorder with psychotic features (HCC) Active Problems:   GAD (generalized anxiety disorder)   Insomnia   Alcohol use  Total Time spent with patient: 30 minutes  Past Psychiatric History: Bipolar d/o  Past Medical History:  Past Medical History:  Diagnosis Date   Heart murmur    MDD (major depressive disorder), severe (HCC) 08/23/2018   History reviewed. No pertinent surgical history. Family History:  Family History  Problem Relation Age of Onset   Hyperlipidemia Father    Family Psychiatric  History: none, reported Social History:  Social History   Substance and Sexual Activity  Alcohol Use Never     Social History   Substance and Sexual Activity  Drug Use Never  Social History   Socioeconomic History   Marital status: Single    Spouse name: Not on file   Number of children: Not on file   Years of education: Not on file   Highest education level: Not on file  Occupational History   Not on file  Tobacco Use   Smoking status: Never   Smokeless tobacco: Never  Vaping Use   Vaping Use: Never used  Substance and Sexual Activity   Alcohol use: Never   Drug use: Never   Sexual activity: Never  Other Topics  Concern   Not on file  Social History Narrative   01/23/21   From: the area   Living: with parents   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, 1 sibling      Enjoys: running      Exercise: daily running    Diet: meat, veggies, grains      Safety   Seat belts: Yes    Guns: No   Safe in relationships: Yes    Social Determinants of Corporate investment banker Strain: Not on file  Food Insecurity: Not on file  Transportation Needs: Not on file  Physical Activity: Not on file  Stress: Not on file  Social Connections: Not on file   Additional Social History:   Sleep: Good  Appetite:  Good  Current Medications: Current Facility-Administered Medications  Medication Dose Route Frequency Provider Last Rate Last Admin   acetaminophen (TYLENOL) tablet 650 mg  650 mg Oral Q6H PRN Princess Bruins, DO       alum & mag hydroxide-simeth (MAALOX/MYLANTA) 200-200-20 MG/5ML suspension 30 mL  30 mL Oral Q4H PRN Princess Bruins, DO       benztropine (COGENTIN) tablet 1 mg  1 mg Oral BID Princess Bruins, DO   1 mg at 06/19/22 0840   diphenhydrAMINE (BENADRYL) capsule 50 mg  50 mg Oral Q6H PRN Comer Locket, MD       Or   diphenhydrAMINE (BENADRYL) injection 50 mg  50 mg Intramuscular Q6H PRN Mason Jim, Amy E, MD       OLANZapine zydis (ZYPREXA) disintegrating tablet 5 mg  5 mg Oral Q8H PRN Izik Bingman, NP       And   LORazepam (ATIVAN) tablet 1 mg  1 mg Oral PRN Starleen Blue, NP       And   ziprasidone (GEODON) injection 20 mg  20 mg Intramuscular PRN Starleen Blue, NP       magnesium hydroxide (MILK OF MAGNESIA) suspension 30 mL  30 mL Oral Daily PRN Princess Bruins, DO       [START ON 06/20/2022] paliperidone (INVEGA) 24 hr tablet 12 mg  12 mg Oral Daily Deya Bigos, NP       traZODone (DESYREL) tablet 50 mg  50 mg Oral QHS PRN Sindy Guadeloupe, NP   50 mg at 06/18/22 2101   Lab Results:  No results found for this or any  previous visit (from the past 48 hour(s)).  Blood Alcohol level:  Lab Results  Component Value Date   [facility] <10 11/08/2021   ETH <10 08/07/2019   Metabolic Disorder Labs: Lab Results  Component Value Date   HGBA1C 4.9 05/28/2022   MPG 93.93 05/28/2022   MPG 96.8 11/15/2021   Lab Results  Component Value Date   PROLACTIN 10.0 08/24/2018   Lab Results  Component Value Date   CHOL  136 05/28/2022   TRIG 246 (H) 05/28/2022   HDL 46 05/28/2022   CHOLHDL 3.0 05/28/2022   VLDL 49 (H) 05/28/2022   LDLCALC 41 05/28/2022   LDLCALC 41 11/15/2021   Physical Findings: AIMS: Facial and Oral Movements Muscles of Facial Expression: None, normal Lips and Perioral Area: None, normal Jaw: None, normal Tongue: None, normal,Extremity Movements Upper (arms, wrists, hands, fingers): None, normal Lower (legs, knees, ankles, toes): None, normal, Trunk Movements Neck, shoulders, hips: None, normal, Overall Severity Severity of abnormal movements (highest score from questions above): None, normal Incapacitation due to abnormal movements: None, normal Patient's awareness of abnormal movements (rate only patient's report): No Awareness, Dental Status Current problems with teeth and/or dentures?: No Does patient usually wear dentures?: No  CIWA:  CIWA-Ar Total: 0 COWS:    AIMS:0 Musculoskeletal: Strength & Muscle Tone: within normal limits Gait & Station: normal Patient leans: N/A  Psychiatric Specialty Exam:  Presentation  General Appearance: Appropriate for Environment; Fairly Groomed  Eye Contact:Good  Speech:Clear and Coherent  Speech Volume:Normal  Handedness:Right  Mood and Affect  Mood:Euthymic  Affect:Appropriate; Congruent  Thought Process  Thought Processes:Coherent  Descriptions of Associations:Intact  Orientation:Full (Time, Place and Person)  Thought Content:Logical  History of Schizophrenia/Schizoaffective disorder:No  Duration of Psychotic  Symptoms:Greater than six months  Hallucinations:Hallucinations: None  Ideas of Reference:None  Suicidal Thoughts:Suicidal Thoughts: No  Homicidal Thoughts:Homicidal Thoughts: No   Sensorium  Memory:Immediate Good  Judgment:Good  Insight:Good  Executive Functions  Concentration:Good  Attention Span:Good  Recall:Good  Fund of Knowledge:Good  Language:Good  Psychomotor Activity  Psychomotor Activity:Psychomotor Activity: Normal  Assets  Assets:Communication Skills  Sleep  Sleep:Sleep: Good  Physical Exam: Physical Exam Constitutional:      Appearance: Normal appearance.  HENT:     Head: Normocephalic.     Nose: Nose normal.  Eyes:     Pupils: Pupils are equal, round, and reactive to light.  Musculoskeletal:        General: Normal range of motion.     Cervical back: Normal range of motion.  Neurological:     Mental Status: He is alert and oriented to person, place, and time.     Sensory: No sensory deficit.     Coordination: Coordination normal.  Psychiatric:        Behavior: Behavior normal.        Thought Content: Thought content normal.    Review of Systems  Constitutional: Negative.  Negative for fever.  HENT: Negative.    Eyes: Negative.   Respiratory: Negative.    Cardiovascular: Negative.   Gastrointestinal: Negative.   Genitourinary: Negative.   Musculoskeletal: Negative.   Skin: Negative.   Neurological: Negative.   Psychiatric/Behavioral:  Negative for depression, hallucinations, memory loss, substance abuse and suicidal ideas. The patient is nervous/anxious (improving). The patient does not have insomnia.    Blood pressure (!) 125/59, pulse 96, temperature 97.8 F (36.6 C), temperature source Oral, resp. rate 16, height 5\' 11"  (1.803 m), weight 101 kg, SpO2 99 %. Body mass index is 31.06 kg/m.  Treatment Plan Summary: Daily contact with patient to assess and evaluate symptoms and progress in treatment and Medication management    Observation Level/Precautions:  15 minute checks  Laboratory:  Labs reviewed   Psychotherapy:  Unit Group sessions  Medications:  See [facility]  Consultations:  To be determined   Discharge Concerns:  Safety, medication compliance, mood stability  Estimated LOS: 5-7 days  Other:  N/A    PLAN Safety and Monitoring:  Voluntary admission to inpatient psychiatric unit for safety, stabilization and treatment Daily contact with patient to assess and evaluate symptoms and progress in treatment Patient's case to be discussed in multi-disciplinary team meeting Observation Level : q15 minute checks Vital signs: q12 hours Precautions: Safety                            Long Term Goal(s): Improvement in symptoms so as ready for discharge   Short Term Goals: Ability to identify changes in lifestyle to reduce recurrence of condition will improve, Ability to disclose and discuss suicidal ideas, Ability to demonstrate self-control will improve, and Ability to maintain clinical measurements within normal limits will improve   Diagnoses: Bipolar 1 d/o with psychotic features R/O Schizoaffective d/o Principal Problem:   Bipolar disorder with psychotic features (HCC) Active Problems:   GAD (generalized anxiety disorder)   Insomnia   Alcohol use                           Medications -Discontinued Haldol 5 mg BID due to dystonia while on it -Continue Invega to 12 mg and change dosing to nightly  -Continue Trazodone 50 mg nightly PRN for insomnia -Continue Cogentin 1 mg BID for EPS prophylaxis -Continue Hydroxyzine 25 mg every 6 hours PRN for anxiety -Discontinued Ativan 1mg  tabs every 6 hours PRN for CIWA > 10 -Continue Agitation protocol (Zyprexa/Ativan/Geodon PRN)-See MAR   Other PRNS -Continue Tylenol 650 mg every 6 hours PRN for mild pain -Continue Milk of Magnesia as needed every 6 hrs for constipation -Continue Zofran disintegrating tabs every 6 hrs PRN for nausea    Discharge Planning: Social  work and case management to assist with discharge planning and identification of hospital follow-up needs prior to discharge Estimated LOS: 5-7 days Discharge Concerns: Need to establish a safety plan; Medication compliance and effectiveness Discharge Goals: Return home with outpatient referrals for mental health follow-up including medication management/psychotherapy  , NP 06/19/2022, 4:16 PMPatient ID: 08/20/2022, male   DOB: 06/24/2001, 20 y.o.   MRN: 09/07/2000 Patient ID: Tenzin Edelman, male   DOB: 2001/05/27, 21 y.o.   MRN: Patient ID: Oluwatimilehin Balfour, male   DOB: 2001-04-29, 21 y.o.   MRN: [id number] Patient ID: Minas Bonser, male   DOB: 2001/04/10, 21 y.o.   MRN: [id number]

## 2022-06-19 NOTE — Progress Notes (Signed)
   06/19/22 2040  Psych Admission Type (Psych Patients Only)  Admission Status Involuntary  Psychosocial Assessment  Patient Complaints None  Eye Contact Fair  Facial Expression Flat  Affect Appropriate to circumstance  Speech Logical/coherent  Interaction Assertive  Motor Activity Other (Comment) (wnl)  Appearance/Hygiene Unremarkable;In scrubs  Behavior Characteristics Cooperative;Appropriate to situation  Mood Pleasant  Thought Process  Coherency WDL  Content WDL  Delusions None reported or observed  Perception WDL  Hallucination None reported or observed  Judgment WDL  Confusion None  Danger to Self  Current suicidal ideation? Denies  Danger to Others  Danger to Others None reported or observed   Progress note   D: Pt seen at med window. Pt denies SI, HI, AVH. Pt rates pain  0/10. Pt rates anxiety  0/10 and depression  0/10. Pt states that the good thing about his day today was that his mother came to see him. He said that this was her first time visiting and they had a good conversation. Pt denies any other issues at this time.  A: Pt provided support and encouragement. Pt given scheduled medication as prescribed. PRNs as appropriate. Q15 min checks for safety.   R: Pt safe on the unit. Will continue to monitor.

## 2022-06-20 DIAGNOSIS — F319 Bipolar disorder, unspecified: Secondary | ICD-10-CM | POA: Diagnosis not present

## 2022-06-20 MED ORDER — PALIPERIDONE ER 6 MG PO TB24
12.0000 mg | ORAL_TABLET | Freq: Every day | ORAL | 0 refills | Status: DC
Start: 2022-06-20 — End: 2022-09-09

## 2022-06-20 MED ORDER — BENZTROPINE MESYLATE 1 MG PO TABS: 1.0000 mg | ORAL_TABLET | Freq: Two times a day (BID) | ORAL | 0 refills | Status: DC

## 2022-06-20 MED ORDER — TRAZODONE HCL 50 MG PO TABS: 50.0000 mg | ORAL_TABLET | Freq: Every evening | ORAL | 0 refills | Status: DC | PRN

## 2022-06-20 NOTE — Group Note (Signed)
Recreation Therapy Group Note   Group Topic:Team Building  Group Date: 06/20/2022 Start Time: 1000 End Time: 1025 Facilitators: Caroll Rancher, LRT,CTRS Location: 500 [address] Dayroom   Goal Area(s) Addresses:  Patient will effectively work with peer towards shared goal.  Patient will identify skills used to make activity successful.  Patient will identify how skills used during activity can be applied to reach post d/c goals.      Group Description: [employer]. In teams of 5-6, patients were given 20 small craft pipe cleaners. Using the materials provided, patients were instructed to compete again the opposing team(s) to build the tallest free-standing structure from floor level. The activity was timed; difficulty increased by Clinical research associate as Production designer, theatre/television/film continued.  Systematically resources were removed with additional directions for example, placing one arm behind their back, working in silence, and shape stipulations. LRT facilitated post-activity discussion reviewing team processes and necessary communication skills involved in completion. Patients were encouraged to reflect how the skills utilized, or not utilized, in this activity can be incorporated to positively impact support systems post discharge.   Affect/Mood: Appropriate   Participation Level: Engaged   Participation Quality: Independent   Behavior: Appropriate   Speech/Thought Process: Focused   Insight: Good   Judgement: Good   Modes of Intervention: Team-building   Patient Response to Interventions:  Engaged   Education Outcome:  Acknowledges education and In group clarification offered    Clinical Observations/Individualized Feedback: Pt was attentive the ideas of peers.  Pt would take the lead at times during the activity.  Pt identified cooperation as one of the skills used during the activity.  Pt expressed using the skills from the activity taught him to compromise with his support system.   Pt stated this skill would bring peace between him and his parents.    Plan: Continue to engage patient in RT group sessions 2-3x/week.   Caroll Rancher, LRT,CTRS 06/20/2022 12:02 PM

## 2022-06-20 NOTE — [facility] Group Notes (Signed)
Adult Psychoeducational Group Note  Date:  06/20/2022 Time:  8:59 AM  Group Topic/Focus:  Goals Group:   The focus of this group is to help patients establish daily goals to achieve during treatment and discuss how the patient can incorporate goal setting into their daily lives to aide in recovery.  Participation Level:  Active  Participation Quality:  Appropriate  Affect:  Appropriate  Cognitive:  Appropriate  Insight: Appropriate  Engagement in Group:  Engaged  Modes of Intervention:  Discussion  Additional Comments:    Mario Thomas 06/20/2022, 8:59 AM

## 2022-06-20 NOTE — Group Note (Signed)
  [facility] LCSW Group Therapy Note  Date/Time:  06/20/2022 1:00pm  Type of Therapy and Topic:  Group Therapy:  Self-Care after Hospitalization  Participation Level:  Active   Description of Group This process group involved patients discussing how they plan to take care of themselves in a better manner when they get home from the hospital.  The group started with patients listing one healthy and one unhealthy way they took care of themselves prior to hospitalization.  A discussion ensued about the differences in healthy and unhealthy coping skills.  Group members shared ideas about making changes when they return home so that they can stay well and in recovery.  The white board was used to list ideas so that patients can continue to see these ideas throughout the day.  Therapeutic Goals Patient will identify and describe one healthy and one unhealthy coping technique used prior to hospitalization Patient will participate in generating ideas about healthy self-care options when they return to the community Patients will be supportive of one another and receive said support from others Patient will identify one healthy self-care activity to add to his/her post-hospitalization life that can help in recovery  Summary of Patient Progress:  The patient expressed that prior to hospitalization some healthy self-care activity he engaged in was answer difficult questions.  Patient's participation in group was active.   Patient identified podcasting as another self-care activity to add in pursuit of recovery post-discharge.   Therapeutic Modalities Brief Solution-Focused Therapy Motivational Interviewing Psychoeducation   Marinda Elk, [place] 06/20/2022  2:29 PM

## 2022-06-20 NOTE — Progress Notes (Signed)
Mario Thomas  D/C'd Home per MD order.  Discussed with the patient and all questions fully answered.   Skin clean, dry and intact without evidence of skin break down, no evidence of skin tears noted.  An After Visit Summary was printed and given to the patient. Patient received prescription.  D/c education completed with patient including follow up instructions, medication list, d/c activities limitations if indicated, with other d/c instructions as indicated by MD - patient able to verbalize understanding, all questions fully answered.   Patient instructed to return to ED, call 911, or call MD for any changes in condition.   Patient escorted to the main entrance , and D/C home via private auto at 1645.  [name] 06/20/2022 4:50 PM

## 2022-06-20 NOTE — Progress Notes (Signed)
Recreation Therapy Notes  INPATIENT RECREATION TR PLAN  Patient Details Name: Mario Thomas MRN: [id number] DOB: October 03, 2001 Today's Date: 06/20/2022  Rec Therapy Plan Is patient appropriate for Therapeutic Recreation?: Yes Treatment times per week: about 3 days Estimated Length of Stay: 5-7 days TR Treatment/Interventions: Group participation (Comment)  Discharge Criteria Pt will be discharged from therapy if:: Discharged Treatment plan/goals/alternatives discussed and agreed upon by:: Patient/family  Discharge Summary Short term goals set: See patient care plan Short term goals met: Complete Progress toward goals comments: Groups attended Which groups?: Self-esteem, Coping skills, Communication, Other (Comment) (Problem Solving; Team Building) Reason goals not met: None Therapeutic equipment acquired: N/A Reason patient discharged from therapy: Discharge from hospital Pt/family agrees with progress & goals achieved: Yes Date patient discharged from therapy: 06/20/22   Victorino Sparrow, Vickki Muff, Aland Chestnutt A 06/20/2022, 12:34 PM

## 2022-06-20 NOTE — Progress Notes (Signed)
  [facility] Adult Case Management Discharge Plan :  Will you be returning to the same living situation after discharge:  Yes,  home with mother At discharge, do you have transportation home?: Yes,  mother Do you have the ability to pay for your medications: Yes,  insurance  Release of information consent forms completed and in the chart;  Patient's signature needed at discharge.  Patient to Follow up at:  Follow-up Information     [facility] Follow up on 06/27/2022.   Why: You have an appointment for therapy services on 06/27/22 at 6:00 pm.   This appointment will be held in person (but may be changed to Virtual). Contact information: [address] [place], [place] [address] [phone]         [facility] Go on 07/10/2022.   Why: You have an appointment for medication management services on  07/10/22 at 3:00 pm.   This appointment will be held in person. Contact information: [address] [place] [address] [phone]         [facility] Call.   Why: A referral has been made for ACTT services on your behalf.  You are on a waiting list to be assessed for services.  A clinician should reach out to set up an appointment.  Please follow up appropriately to check status on waiting list and to get connected to services. Contact information: [address] [place] [address] [phone]                 Next level of care provider has access to [facility] Link:no  Safety Planning and Suicide Prevention discussed: Yes,  Meseret  (mother)     Has patient been referred to the Quitline?: N/A patient is not a smoker  Patient has been referred for addiction treatment: N/A  Marinda Elk, LCSW 06/20/2022, 11:10 AM

## 2022-06-20 NOTE — Plan of Care (Signed)
Patient was able to identify ways of improving communication at completion of recreation therapy group sessions.   Caroll Rancher, LRT,CTRS

## 2022-06-20 NOTE — Progress Notes (Signed)
   06/20/22 0551  Sleep  Number of Hours 7.25

## 2022-06-20 NOTE — Discharge Summary (Signed)
Physician Discharge Summary Note  Patient:  Mario Thomas is an 21 y.o., male MRN:  [id number] DOB:  05-23-2001 Patient phone:  [phone] (home)  Patient address:   [address] [name] [facility] [address],  Total Time spent with patient: 30 minutes  Date of Admission:  06/11/2022 Date of Discharge: 06/20/2022  Reason for Admission: Mario Thomas is a 21 y.o male with a history of bipolar 1 d/o, ADHD who was involuntarily committed and taken to the [facility] health urgent care [facility]) for making threats to his parents (stated today is the last day that they will live), bizarre behaviors (trying to take a car parked in the garage which is in front of  two other vehicles w/o moving those vehicles), and medication non compliance. Pt was previously hospitalized at this [facility] from 11/09/21 through 11/17/2021 for manic type behaviors. For this hospitalization, pt was transferred to this [facility] for treatment and stabilization of his mood.    Principal Problem: Bipolar disorder with psychotic features [facility]) Discharge Diagnoses: Principal Problem:   Bipolar disorder with psychotic features (HCC) Active Problems:   GAD (generalized anxiety disorder)   Insomnia   Alcohol use  Past Psychiatric History: As above  Past Medical History:  Past Medical History:  Diagnosis Date   Heart murmur    MDD (major depressive disorder), severe (HCC) 08/23/2018   History reviewed. No pertinent surgical history. Family History:  Family History  Problem Relation Age of Onset   Hyperlipidemia Father    Family Psychiatric  History: none reported Social History:  Social History   Substance and Sexual Activity  Alcohol Use Never     Social History   Substance and Sexual Activity  Drug Use Never    Social History   Socioeconomic History   Marital status: Single    Spouse name: Not on file   Number of children: Not on file   Years of education: Not on file   Highest education level: Not on  file  Occupational History   Not on file  Tobacco Use   Smoking status: Never   Smokeless tobacco: Never  Vaping Use   Vaping Use: Never used  Substance and Sexual Activity   Alcohol use: Never   Drug use: Never   Sexual activity: Never  Other Topics Concern   Not on file  Social History Narrative   01/23/21   From: the area   Living: with parents   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, 1 sibling      Enjoys: running      Exercise: daily running    Diet: meat, veggies, grains      Safety   Seat belts: Yes    Guns: No   Safe in relationships: Yes    Social Determinants of Corporate investment banker Strain: Not on file  Food Insecurity: Not on file  Transportation Needs: Not on file  Physical Activity: Not on file  Stress: Not on file  Social Connections: Not on file                                            HOSPITAL COURSE During the patient's hospitalization, patient had extensive initial psychiatric evaluation, and follow-up psychiatric evaluations every day.  Psychiatric diagnoses provided upon initial assessment were  as follows:  Principal Problem:   Bipolar disorder with psychotic features (HCC) Active Problems:   GAD (generalized anxiety disorder)   Insomnia   Alcohol use   Patient's psychiatric medications were adjusted on admission as follows:   -Increased Invega to 9 mg nightly on 7/4 for psychosis (Med was started while pt was in the ER) -Started Trazodone 50 mg nightly PRN for insomnia -Started Cogentin 1 mg BID for EPS prophylaxis -Started Hydroxyzine 25 mg every 6 hours PRN for anxiety -Started Ativan 1mg  tabs every 6 hours PRN for CIWA > 10 since pt reported drinking alcohol with his friends   During the hospitalization, other adjustments were made to the patient's psychiatric medication regimen. Pt's psychosis persisted after being on Invega for  a few days after admission, and a  second antipsychotic medication (Haldol 5 mg BID) was started on 7/8. Pt had a dystonic reaction, and the Haldol was stopped, and Invega 12 mg was continued. Pt has not had any EPS/TD symptoms while on Invega alone. AIMS at discharge:0.    Medications at discharge are as follows:  -Continue Invega to 12 mg and change dosing to nightly  -Continue Trazodone 50 mg nightly PRN for insomnia -Continue Cogentin 1 mg BID for EPS prophylaxis   Patient's care was discussed during the interdisciplinary team meeting every day during the hospitalization. The patient denies having side effects to prescribed psychiatric medication. The patient was evaluated each day by a clinical provider to ascertain response to treatment. Improvement was noted by the patient's report of decreasing symptoms, improved sleep and appetite, affect, medication tolerance, behavior, and participation in unit programming.  Patient was asked each day to complete a self inventory noting mood, mental status, pain, new symptoms, anxiety and concerns.     Symptoms were reported as significantly decreased or resolved completely by discharge. On day of discharge, the patient reports that their mood is stable. The patient denied having suicidal thoughts for more than 48 hours prior to discharge.  Patient denies having homicidal thoughts.  Patient denies having auditory hallucinations.  Patient denies any visual hallucinations or other symptoms of psychosis. The patient was motivated to continue taking medication with a goal of continued improvement in mental health.    The patient reports their target psychiatric symptoms of depression responded well to the psychiatric medications, and the patient reports overall benefit from this psychiatric hospitalization. Supportive psychotherapy was provided to the patient. The patient also participated in regular group therapy while hospitalized. Coping skills, problem solving as well as relaxation therapies  were also part of the unit programming.     Physical Findings: AIMS: Facial and Oral Movements Muscles of Facial Expression: None, normal Lips and Perioral Area: None, normal Jaw: None, normal Tongue: None, normal,Extremity Movements Upper (arms, wrists, hands, fingers): None, normal Lower (legs, knees, ankles, toes): None, normal, Trunk Movements Neck, shoulders, hips: None, normal, Overall Severity Severity of abnormal movements (highest score from questions above): None, normal Incapacitation due to abnormal movements: None, normal Patient's awareness of abnormal movements (rate only patient's report): No Awareness, Dental Status Current problems with teeth and/or dentures?: No Does patient usually wear dentures?: No  CIWA:  CIWA-Ar Total: 0 COWS:  n/a   Musculoskeletal: Strength & Muscle Tone: within normal limits Gait & Station: normal Patient leans: N/A  Psychiatric Specialty Exam:  Presentation  General Appearance: Appropriate for Environment; Fairly Groomed  Eye Contact:Good  Speech:Clear and Coherent  Speech Volume:Normal  Handedness:Right   Mood and Affect  Mood:Euthymic  Affect:Appropriate; Congruent  Thought Process  Thought Processes:Coherent  Descriptions of Associations:Intact  Orientation:Full (Time, Place and Person)  Thought Content:Logical  History of Schizophrenia/Schizoaffective disorder:No  Duration of Psychotic Symptoms:Greater than six months  Hallucinations:Hallucinations: None Description of Auditory Hallucinations: none at this time Description of Visual Hallucinations: none at this time  Ideas of Reference:None  Suicidal Thoughts:Suicidal Thoughts: No  Homicidal Thoughts:Homicidal Thoughts: No  Sensorium  Memory:Immediate Good  Judgment:Good  Insight:Good  Executive Functions  Concentration:Good  Attention Span:Good  Recall:Good  Fund of Knowledge:Good  Language:Good  Psychomotor Activity  Psychomotor  Activity:Psychomotor Activity: Normal  Assets  Assets:Communication Skills; Housing; Social Support  Sleep  Sleep:Sleep: Good  Physical Exam: Physical Exam Constitutional:      Appearance: Normal appearance.  HENT:     Head: Normocephalic.     Nose: Nose normal. No congestion or rhinorrhea.  Eyes:     Pupils: Pupils are equal, round, and reactive to light.  Musculoskeletal:        General: Normal range of motion.     Cervical back: Normal range of motion.  Neurological:     Mental Status: He is alert and oriented to person, place, and time.  Psychiatric:        Thought Content: Thought content normal.    Review of Systems  Constitutional: Negative.  Negative for fever.  HENT: Negative.    Eyes: Negative.   Respiratory: Negative.    Cardiovascular: Negative.   Gastrointestinal: Negative.   Genitourinary: Negative.   Musculoskeletal: Negative.   Skin: Negative.   Neurological: Negative.   Psychiatric/Behavioral:  Negative for depression, hallucinations, memory loss, substance abuse and suicidal ideas. The patient is not nervous/anxious and does not have insomnia.    Blood pressure 111/62, pulse (!) 102, temperature 97.9 F (36.6 C), temperature source Oral, resp. rate 16, height 5\' 11"  (1.803 m), weight 101 kg, SpO2 100 %. Body mass index is 31.06 kg/m.   Social History   Tobacco Use  Smoking Status Never  Smokeless Tobacco Never   Tobacco Cessation:  N/A, patient does not currently use tobacco products   Blood Alcohol level:  Lab Results  Component Value Date   ETH <10 11/08/2021   ETH <10 08/07/2019    Metabolic Disorder Labs:  Lab Results  Component Value Date   HGBA1C 4.9 05/28/2022   MPG 93.93 05/28/2022   MPG 96.8 11/15/2021   Lab Results  Component Value Date   PROLACTIN 10.0 08/24/2018   Lab Results  Component Value Date   CHOL 136 05/28/2022   TRIG 246 (H) 05/28/2022   HDL 46 05/28/2022   CHOLHDL 3.0 05/28/2022   VLDL 49 (H)  05/28/2022   LDLCALC 41 05/28/2022   LDLCALC 41 11/15/2021    See Psychiatric Specialty Exam and Suicide Risk Assessment completed by Attending Physician prior to discharge.  Discharge destination:  Home  Is patient on multiple antipsychotic therapies at discharge:  No   Has Patient had three or more failed trials of antipsychotic monotherapy by history:  No  Recommended Plan for Multiple Antipsychotic Therapies: NA   Allergies as of 06/20/2022   No Known Allergies      Medication List     TAKE these medications      Indication  benztropine 1 MG tablet Commonly known as: COGENTIN Take 1 tablet (1 mg total) by mouth 2 (two) times daily.  Indication: Extrapyramidal Reaction caused by Medications   paliperidone 6 MG 24 hr tablet Commonly known as: INVEGA Take 2  tablets (12 mg total) by mouth daily. What changed: how much to take  Indication: mood stabilization   traZODone 50 MG tablet Commonly known as: DESYREL Take 1 tablet (50 mg total) by mouth at bedtime as needed for sleep.  Indication: Trouble Sleeping        Follow-up Information     [facility] Follow up on 06/27/2022.   Why: You have an appointment for therapy services on 06/27/22 at 6:00 pm.   This appointment will be held in person (but may be changed to Virtual). Contact information: [address] [place], [place] [address] [phone]         [facility] Go on 07/10/2022.   Why: You have an appointment for medication management services on  07/10/22 at 3:00 pm.   This appointment will be held in person. Contact information: [address] [place] [address] [phone]         [facility] Call.   Why: A referral has been made for ACTT services on your behalf.  You are on a waiting list to be assessed for services.  A clinician should reach out to set up an appointment.  Please follow up  appropriately to check status on waiting list and to get connected to services. Contact information: [address] [place] [address] [phone]                Follow-up recommendations:   The patient is able to verbalize their individual safety plan to this provider.   # It is recommended to the patient to continue psychiatric medications as prescribed, after discharge from the hospital.     # It is recommended to the patient to follow up with your outpatient psychiatric provider and PCP.   # It was discussed with the patient, the impact of alcohol, drugs, tobacco have been there overall psychiatric and medical wellbeing, and total abstinence from substance use was recommended the patient.ed.   # Prescriptions provided or sent directly to preferred pharmacy at discharge. Patient agreeable to plan. Given opportunity to ask questions. Appears to feel comfortable with discharge.    # In the event of worsening symptoms, the patient is instructed to call the crisis hotline (988), 911 and or go to the nearest ED for appropriate evaluation and treatment of symptoms. To follow-up with primary care provider for other medical issues, concerns and or health care needs. Pt verbalized understanding.   # Patient was discharged home with mother with a plan to follow up as noted above.     Signed: Starleen Blue, NP 06/20/2022, 3:08 PM

## 2022-06-20 NOTE — [facility] Suicide Risk Assessment (Cosign Needed Addendum)
Suicide Risk Assessment  Discharge Assessment    [facility] Discharge Suicide Risk Assessment   Principal Problem: Bipolar disorder with psychotic features [facility]) Discharge Diagnoses: Principal Problem:   Bipolar disorder with psychotic features (HCC) Active Problems:   GAD (generalized anxiety disorder)   Insomnia   Alcohol use  History of Present Illness: Mario Thomas is a 21 y.o male with a history of bipolar 1 d/o, ADHD who was involuntarily committed and taken to the [facility] health urgent care [facility]) for making threats to his parents (stated today is the last day that they will live), bizarre behaviors (trying to take a car parked in the garage which is in front of  two other vehicles w/o moving those vehicles), and medication non compliance. Pt was previously hospitalized at this [facility] from 11/09/21 through 11/17/2021 for manic type behaviors. For this hospitalization, pt was transferred to this [facility] for treatment and stabilization of his mood.                                                HOSPITAL COURSE During the patient's hospitalization, patient had extensive initial psychiatric evaluation, and follow-up psychiatric evaluations every day.  Psychiatric diagnoses provided upon initial assessment were as follows:  Principal Problem:   Bipolar disorder with psychotic features (HCC) Active Problems:   GAD (generalized anxiety disorder)   Insomnia   Alcohol use  Patient's psychiatric medications were adjusted on admission as follows:  -Increased Invega to 9 mg nightly on 7/4 for psychosis (Med was started while pt was in the ER) -Started Trazodone 50 mg nightly PRN for insomnia -Started Cogentin 1 mg BID for EPS prophylaxis -Started Hydroxyzine 25 mg every 6 hours PRN for anxiety -Started Ativan 1mg  tabs every 6 hours PRN for CIWA > 10 since pt reported drinking alcohol with his friends  During the hospitalization, other adjustments were made to the patient's  psychiatric medication regimen. Pt's psychosis persisted after being on Invega for  a few days after admission, and a second antipsychotic medication (Haldol 5 mg BID) was started on 7/8. Pt had a dystonic reaction, and the Haldol was stopped, and Invega 12 mg was continued. Pt has not had any EPS/TD symptoms while on Invega alone. AIMS at discharge:0.   Medications at discharge are as follows:  -Continue Invega to 12 mg and change dosing to nightly  -Continue Trazodone 50 mg nightly PRN for insomnia -Continue Cogentin 1 mg BID for EPS prophylaxis  Patient's care was discussed during the interdisciplinary team meeting every day during the hospitalization. The patient denies having side effects to prescribed psychiatric medication. The patient was evaluated each day by a clinical provider to ascertain response to treatment. Improvement was noted by the patient's report of decreasing symptoms, improved sleep and appetite, affect, medication tolerance, behavior, and participation in unit programming.  Patient was asked each day to complete a self inventory noting mood, mental status, pain, new symptoms, anxiety and concerns.    Symptoms were reported as significantly decreased or resolved completely by discharge. On day of discharge, the patient reports that their mood is stable. The patient denied having suicidal thoughts for more than 48 hours prior to discharge.  Patient denies having homicidal thoughts.  Patient denies having auditory hallucinations.  Patient denies any visual hallucinations or other symptoms of psychosis. The patient was motivated to continue  taking medication with a goal of continued improvement in mental health.   The patient reports their target psychiatric symptoms of depression responded well to the psychiatric medications, and the patient reports overall benefit from this psychiatric hospitalization. Supportive psychotherapy was provided to the patient. The patient also  participated in regular group therapy while hospitalized. Coping skills, problem solving as well as relaxation therapies were also part of the unit programming.  Labs were reviewed with the patient, and abnormal results were discussed with the patient.  Total Time spent with patient: 30 minutes  Musculoskeletal: Strength & Muscle Tone: within normal limits Gait & Station: normal Patient leans: N/A  Psychiatric Specialty Exam  Presentation  General Appearance: Appropriate for Environment; Fairly Groomed  Eye Contact:Good  Speech:Clear and Coherent  Speech Volume:Normal  Handedness:Right  Mood and Affect  Mood:Euthymic  Duration of Depression Symptoms: Greater than two weeks  Affect:Appropriate; Congruent  Thought Process  Thought Processes:Coherent  Descriptions of Associations:Intact  Orientation:Full (Time, Place and Person)  Thought Content:Logical  History of Schizophrenia/Schizoaffective disorder:No  Duration of Psychotic Symptoms:Greater than six months  Hallucinations:Hallucinations: None Description of Auditory Hallucinations: none at this time Description of Visual Hallucinations: none at this time  Ideas of Reference:None  Suicidal Thoughts:Suicidal Thoughts: No  Homicidal Thoughts:Homicidal Thoughts: No  Sensorium  Memory:Immediate Good  Judgment:Good  Insight:Good  Executive Functions  Concentration:Good  Attention Span:Good  Recall:Good  Fund of Knowledge:Good  Language:Good  Psychomotor Activity  Psychomotor Activity:Psychomotor Activity: Normal  Assets  Assets:Communication Skills; Housing; Social Support  Sleep  Sleep:Sleep: Good  Physical Exam: Physical Exam Constitutional:      Appearance: Normal appearance.  HENT:     Head: Normocephalic.     Nose: Nose normal. No congestion or rhinorrhea.  Eyes:     Pupils: Pupils are equal, round, and reactive to light.  Pulmonary:     Effort: Pulmonary effort is normal.   Musculoskeletal:        General: Normal range of motion.     Cervical back: Normal range of motion.  Neurological:     Mental Status: He is alert and oriented to person, place, and time.     Sensory: No sensory deficit.     Coordination: Coordination normal.  Psychiatric:        Behavior: Behavior normal.        Thought Content: Thought content normal.    Review of Systems  Constitutional: Negative.  Negative for fever.  HENT: Negative.  Negative for sore throat.   Eyes: Negative.   Respiratory: Negative.  Negative for cough.   Cardiovascular: Negative.   Gastrointestinal: Negative.  Negative for heartburn.  Genitourinary: Negative.   Musculoskeletal: Negative.   Skin: Negative.   Neurological: Negative.   Endo/Heme/Allergies: Negative.   Psychiatric/Behavioral:  Negative for depression, hallucinations, memory loss, substance abuse and suicidal ideas. The patient is not nervous/anxious and does not have insomnia.    Blood pressure 111/62, pulse (!) 102, temperature 97.9 F (36.6 C), temperature source Oral, resp. rate 16, height 5\' 11"  (1.803 m), weight 101 kg, SpO2 100 %. Body mass index is 31.06 kg/m.  Mental Status Per Nursing Assessment::   On Admission:  NA  Demographic Factors:  Male  Loss Factors: NA  Historical Factors: NA  Risk Reduction Factors:   Living with another person, especially a relative and Positive social support  Continued Clinical Symptoms:  Patient's psychosis, has resolved on the Invega 12 mg daily. He currently denies SI/HI/AVH, denies paranoia, and there is no  evidence of delusional thinking. Pt verbally contracts for safety outside of this [facility].   Cognitive Features That Contribute To Risk:  None    Suicide Risk:  Minimal: No identifiable suicidal ideation.  Patients presenting with no risk factors; may be classified as minimal risk based on the severity of the depressive symptoms-Pt denies SI/HI/AVH, denies paranoia, there is no  evidence of delusional thinking at discharge. Pt verbally contracts for safety outside of this [facility]. Patient's mother contacted by CSW prior to discharge, and she denies any concerns related to discharge, and reported feeling comfortable with taking pt back home. She states that she will be administering pt his medications, and will take him to his f/u appointments.    Follow-up Information     [facility] Follow up on 06/27/2022.   Why: You have an appointment for therapy services on 06/27/22 at 6:00 pm.   This appointment will be held in person (but may be changed to Virtual). Contact information: [address] [place], [place] [address] [phone]         [facility] Go on 07/10/2022.   Why: You have an appointment for medication management services on  07/10/22 at 3:00 pm.   This appointment will be held in person. Contact information: [address] [place] [address] [phone]         [facility] Call.   Why: A referral has been made for ACTT services on your behalf.  You are on a waiting list to be assessed for services.  A clinician should reach out to set up an appointment.  Please follow up appropriately to check status on waiting list and to get connected to services. Contact information: [address] [place] [address] [phone]                Plan Of Care/Follow-up recommendations:  The patient is able to verbalize their individual safety plan to this provider.  # It is recommended to the patient to continue psychiatric medications as prescribed, after discharge from the hospital.    # It is recommended to the patient to follow up with your outpatient psychiatric provider and PCP.  # It was discussed with the patient, the impact of alcohol, drugs, tobacco have been there overall psychiatric and medical wellbeing, and total abstinence from substance use  was recommended the patient.ed.  # Prescriptions provided or sent directly to preferred pharmacy at discharge. Patient agreeable to plan. Given opportunity to ask questions. Appears to feel comfortable with discharge.    # In the event of worsening symptoms, the patient is instructed to call the crisis hotline (988), 911 and or go to the nearest ED for appropriate evaluation and treatment of symptoms. To follow-up with primary care provider for other medical issues, concerns and or health care needs. Pt verbalized understanding.  # Patient was discharged home with mother with a plan to follow up as noted above.   Starleen Blue, NP 06/20/2022, 3:04 PM

## 2022-06-26 ENCOUNTER — Telehealth (HOSPITAL_COMMUNITY): Payer: Self-pay

## 2022-06-26 NOTE — BH Assessment (Signed)
Care Management - [facility] Follow Up [facility] Discharges   Patient has been placed in an inpatient psychiatric hospital [facility]) on 06-11-2022.

## 2022-07-19 ENCOUNTER — Emergency Department (HOSPITAL_COMMUNITY)
Admission: EM | Admit: 2022-07-19 | Discharge: 2022-07-21 | Disposition: A | Discharge status: Other Acute Inpatient Hospital | Payer: 59 | Attending: Emergency Medicine | Admitting: Emergency Medicine

## 2022-07-19 ENCOUNTER — Other Ambulatory Visit: Payer: Self-pay

## 2022-07-19 DIAGNOSIS — Z20822 Contact with and (suspected) exposure to covid-19: Secondary | ICD-10-CM | POA: Insufficient documentation

## 2022-07-19 DIAGNOSIS — R4689 Other symptoms and signs involving appearance and behavior: Secondary | ICD-10-CM | POA: Diagnosis not present

## 2022-07-19 DIAGNOSIS — F312 Bipolar disorder, current episode manic severe with psychotic features: Secondary | ICD-10-CM

## 2022-07-19 DIAGNOSIS — F319 Bipolar disorder, unspecified: Secondary | ICD-10-CM | POA: Insufficient documentation

## 2022-07-19 LAB — COMPREHENSIVE METABOLIC PANEL
ALT: 15 U/L (ref 0–44)
AST: 19 U/L (ref 15–41)
Albumin: 3.9 g/dL (ref 3.5–5.0)
Alkaline Phosphatase: 68 U/L (ref 38–126)
Anion gap: 6 (ref 5–15)
BUN: 12 mg/dL (ref 6–20)
CO2: 26 mmol/L (ref 22–32)
Calcium: 9.1 mg/dL (ref 8.9–10.3)
Chloride: 107 mmol/L (ref 98–111)
Creatinine, Ser: 0.87 mg/dL (ref 0.61–1.24)
GFR, Estimated: >60 mL/min (ref >60)
Glucose, Bld: 90 mg/dL (ref 70–99)
Potassium: 3.9 mmol/L (ref 3.5–5.1)
Sodium: 139 mmol/L (ref 135–145)
Total Bilirubin: 0.6 mg/dL (ref 0.3–1.2)
Total Protein: 6.8 g/dL (ref 6.5–8.1)

## 2022-07-19 LAB — CBC WITH DIFFERENTIAL/PLATELET
Abs Immature Granulocytes: 0.01 10*3/uL (ref 0.00–0.07)
Basophils Absolute: 0.1 10*3/uL (ref 0.0–0.1)
Basophils Relative: 1 %
Eosinophils Absolute: 0.2 10*3/uL (ref 0.0–0.5)
Eosinophils Relative: 4 %
HCT: 44.6 % (ref 39.0–52.0)
Hemoglobin: 14.9 g/dL (ref 13.0–17.0)
Immature Granulocytes: 0 %
Lymphocytes Relative: 32 %
Lymphs Abs: 2 10*3/uL (ref 0.7–4.0)
MCH: 29.9 pg (ref 26.0–34.0)
MCHC: 33.4 g/dL (ref 30.0–36.0)
MCV: 89.4 fL (ref 80.0–100.0)
Monocytes Absolute: 0.7 10*3/uL (ref 0.1–1.0)
Monocytes Relative: 10 %
Neutro Abs: 3.4 10*3/uL (ref 1.7–7.7)
Neutrophils Relative %: 53 %
Platelets: 162 10*3/uL (ref 150–400)
RBC: 4.99 MIL/uL (ref 4.22–5.81)
RDW: 12.6 % (ref 11.5–15.5)
WBC: 6.4 10*3/uL (ref 4.0–10.5)
nRBC: 0 % (ref 0.0–0.2)

## 2022-07-19 LAB — ETHANOL: Alcohol, Ethyl (B): <10 mg/dL (ref <10)

## 2022-07-19 LAB — RESP PANEL BY RT-PCR (FLU A&B, COVID) ARPGX2
Influenza A by PCR: NEGATIVE
Influenza B by PCR: NEGATIVE
SARS Coronavirus 2 by RT PCR: NEGATIVE

## 2022-07-19 LAB — RAPID URINE DRUG SCREEN, HOSP PERFORMED
Amphetamines: NOT DETECTED
Barbiturates: NOT DETECTED
Benzodiazepines: NOT DETECTED
Cocaine: NOT DETECTED
Opiates: NOT DETECTED
Tetrahydrocannabinol: NOT DETECTED

## 2022-07-19 MED ORDER — ZIPRASIDONE MESYLATE 20 MG IM SOLR
20.0000 mg | Freq: Once | INTRAMUSCULAR | Status: AC
  Administered 2022-07-19: 20 mg via INTRAMUSCULAR

## 2022-07-19 MED ORDER — TRAZODONE HCL 50 MG PO TABS: 50.0000 mg | ORAL_TABLET | Freq: Every evening | ORAL | Status: DC | PRN

## 2022-07-19 MED ORDER — ZIPRASIDONE MESYLATE 20 MG IM SOLR
20.0000 mg | INTRAMUSCULAR | Status: AC | PRN
  Administered 2022-07-20: 20 mg via INTRAMUSCULAR
  Filled 2022-07-19: qty 20

## 2022-07-19 MED ORDER — BENZTROPINE MESYLATE 0.5 MG PO TABS
0.5000 mg | ORAL_TABLET | Freq: Two times a day (BID) | ORAL | Status: DC
  Administered 2022-07-19 – 2022-07-21 (×5): 0.5 mg via ORAL
  Filled 2022-07-19 (×5): qty 1

## 2022-07-19 MED ORDER — ZIPRASIDONE MESYLATE 20 MG IM SOLR
10.0000 mg | Freq: Once | INTRAMUSCULAR | Status: DC
  Filled 2022-07-19: qty 20

## 2022-07-19 MED ORDER — LORAZEPAM 1 MG PO TABS: 1.0000 mg | ORAL_TABLET | ORAL | Status: DC | PRN

## 2022-07-19 MED ORDER — PALIPERIDONE ER 3 MG PO TB24
3.0000 mg | ORAL_TABLET | Freq: Two times a day (BID) | ORAL | Status: DC
  Administered 2022-07-19 – 2022-07-21 (×5): 3 mg via ORAL
  Filled 2022-07-19 (×5): qty 1

## 2022-07-19 MED ORDER — OLANZAPINE 5 MG PO TBDP
5.0000 mg | ORAL_TABLET | Freq: Three times a day (TID) | ORAL | Status: DC | PRN
  Administered 2022-07-19: 5 mg via ORAL
  Filled 2022-07-19: qty 1

## 2022-07-19 MED ORDER — LORAZEPAM 1 MG PO TABS
1.0000 mg | ORAL_TABLET | Freq: Once | ORAL | Status: AC
  Administered 2022-07-19: 1 mg via ORAL
  Filled 2022-07-19: qty 1

## 2022-07-19 MED ORDER — STERILE WATER FOR INJECTION IJ SOLN
INTRAMUSCULAR | Status: AC
  Administered 2022-07-19: 1.2 mL
  Filled 2022-07-19: qty 10

## 2022-07-19 NOTE — ED Triage Notes (Signed)
Patient coming to ED for psych evaluation.  Patient was BIB sheriff officers for "wanting to stay here a while and eat the food."  No reports of SI/HI.  Reports "me and psych are friends."  Patient actively taking name stickers and placing them all over body

## 2022-07-19 NOTE — ED Notes (Signed)
This nurse attempted to draw blood again. After tieing the tourniquet, pt states he "does not want this done". Pt encouraged to have his blood drawn to speed up this process. Pt still refused. Pt pacing at this time

## 2022-07-19 NOTE — BH Assessment (Addendum)
BHH Assessment Progress Note   Per Hillery Jacks, NP, this pt requires psychiatric hospitalization at this time.  Pt presents under IVC initiated by EDP April Palumbo, MD.  Tressie Ellis [facility] will not be able to accommodate this pt today.  At the direction of Nelly Rout, MD this writer has sought placement for pt at facilities outside of the [facility] system.  The following facilities have been contacted to seek placement for this pt, with results as noted:  Beds available, information sent, decision pending: [facility]  At capacity: Tressie Ellis [facility]  Doylene Canning, [place] Behavioral Health Coordinator [phone]

## 2022-07-19 NOTE — ED Notes (Signed)
Pt pacing in front of stretcher. Pt reminded to stay at his bed

## 2022-07-19 NOTE — ED Notes (Signed)
Offered zyprexa and patient refuses

## 2022-07-19 NOTE — ED Provider Notes (Signed)
[facility]-EMERGENCY DEPT Provider Note   CSN: [id number] Arrival date & time: 07/19/22  0418     History  Chief Complaint  Patient presents with   Psychiatric Evaluation    Mario Thomas is a 21 y.o. male.  21 year old male with a history of bipolar 1 d/o, ADHD presents to the emergency department voluntarily.  Called EMS tonight to be brought to the ED for evaluation.  States that he came because he wanted to "stay here and eat the food".  Reports "knee and psych are friends".  Patient noted to be taking his name stickers and placing them all over his body.  He states that he has been compliant with his prescribed medications.  He denies suicidal and homicidal thoughts.  Further denies alcohol and illicit drug use.  The history is provided by the patient. No language interpreter was used.       Home Medications Prior to Admission medications   Medication Sig Start Date End Date Taking? Authorizing Provider  benztropine (COGENTIN) 1 MG tablet Take 1 tablet (1 mg total) by mouth 2 (two) times daily. 06/20/22  Yes Starleen Blue, NP  paliperidone (INVEGA) 6 MG 24 hr tablet Take 2 tablets (12 mg total) by mouth daily. 06/20/22  Yes Starleen Blue, NP  traZODone (DESYREL) 50 MG tablet Take 1 tablet (50 mg total) by mouth at bedtime as needed for sleep. 06/20/22  Yes Starleen Blue, NP      Allergies    Patient has no known allergies.    Review of Systems   Review of Systems Ten systems reviewed and are negative for acute change, except as noted in the HPI.    Physical Exam Updated Vital Signs BP (!) 133/96 (BP Location: Left Arm)   Pulse 92   Temp 98.1 F (36.7 C) (Oral)   Resp 18   Ht 5\' 11"  (1.803 m)   Wt 100.7 kg   SpO2 100%   BMI 30.96 kg/m   Physical Exam Vitals and nursing note reviewed.  Constitutional:      General: He is not in acute distress.    Appearance: He is well-developed. He is not diaphoretic.  HENT:     Head: Normocephalic and  atraumatic.  Eyes:     General: No scleral icterus.    Conjunctiva/sclera: Conjunctivae normal.  Pulmonary:     Effort: Pulmonary effort is normal. No respiratory distress.  Musculoskeletal:        General: Normal range of motion.     Cervical back: Normal range of motion.  Skin:    General: Skin is warm and dry.     Coloration: Skin is not pale.     Findings: No erythema or rash.  Neurological:     Mental Status: He is alert and oriented to person, place, and time.  Psychiatric:        Speech: Speech normal.        Behavior: Behavior normal.        Thought Content: Thought content does not include homicidal or suicidal ideation.        Judgment: Judgment is inappropriate.     Comments: Bizarre behavior     ED Results / Procedures / Treatments   Labs (all labs ordered are listed, but only abnormal results are displayed) Labs Reviewed  RESP PANEL BY RT-PCR (FLU A&B, COVID) ARPGX2  RAPID URINE DRUG SCREEN, HOSP PERFORMED  CBC WITH DIFFERENTIAL/PLATELET  COMPREHENSIVE METABOLIC PANEL  ETHANOL    EKG None  Radiology No results found.  Procedures Procedures    Medications Ordered in ED Medications  ziprasidone (GEODON) injection 20 mg (20 mg Intramuscular Given 07/19/22 0517)  sterile water (preservative free) injection (1.2 mLs  Given 07/19/22 0518)    ED Course/ Medical Decision Making/ A&P Clinical Course as of 07/19/22 0615  Thu Jul 19, 2022  0515 Patient attempting to strangle self in triage. IVC papers initiated. Geodon ordered. [KH]  1610 Patient increasingly agitated and combative/threatening toward staff. Security alerted. [KH]    Clinical Course User Index [KH] Antony Madura, PA-C                           Medical Decision Making Amount and/or Complexity of Data Reviewed Labs: ordered.  Risk Prescription drug management.   21 year old male presents to the emergency department for psychiatric evaluation.  He was witnessed trying to strangle  himself in triage and subsequently became aggressive and combative with staff.  IVC papers initiated and Geodon given for safety/sedation.  Pending TTS evaluation to assist in disposition.  Care to be assumed by oncoming ED provider.        Final Clinical Impression(s) / ED Diagnoses Final diagnoses:  Bipolar 1 disorder [facility])  Aggressive behavior    Rx / DC Orders ED Discharge Orders     None         Antony Madura, PA-C 07/19/22 0615    Palumbo, April, MD 07/19/22 [phone]

## 2022-07-19 NOTE — ED Notes (Signed)
Pt still refusing bloodwork at this time.

## 2022-07-19 NOTE — ED Notes (Signed)
Pt is refusing blood work at this time. Pt talking and the devil and sins and unable to sit still

## 2022-07-19 NOTE — ED Notes (Signed)
Patient is pacing hallway.

## 2022-07-19 NOTE — ED Notes (Signed)
Pt asleep. Will attempt to collect vitals later 

## 2022-07-19 NOTE — ED Notes (Signed)
Pt currently pacing back and forth stating that he is dead. MD aware.

## 2022-07-19 NOTE — BH Assessment (Signed)
Per Dr. Kumar in the 9am morning huddle/bed meeting, Provider to assess. No TTS CCA is needed at this time. 

## 2022-07-19 NOTE — Consult Note (Addendum)
[facility] ED ASSESSMENT   Reason for Consult: Mental health evaluation and recommendation Referring Physician:  April Palumbo, MD  Patient Identification: Mario Thomas MRN:  [id number] ED Chief Complaint: Bipolar disorder with psychotic features [facility])  Diagnosis:  Principal Problem:   Bipolar disorder with psychotic features [facility])   ED Assessment Time Calculation: Start Time: 1038 Stop Time: 1145 Total Time in Minutes (Assessment Completion): 67  Mario Thomas is a 22 y.o. male patient admitted with 07/19/2022.  HPI:  Mario Thomas is a 21 yo male with a history of bipolar 1 d/o & ADHD who presented to the ED earlier today morning after calling the EMS to be brought in for an evaluation. While in the ED, pt stated that his reason for coming in is to "stay here and eat the food".  Pt was noted to be taking his name stickers off and placing them all over his body.   Today's assessment : On assessment today, pt is pacing back and forth restlessly. Pt with flat affect and an anxious and depressed mood, attention to personal hygiene and grooming is poor, eye contact is good, speech is clear, but pressured and incoherent.  Pt  currently denies SI/HI/AVH, but keeps looking suspiciously into hallway, and seems paranoid. He seems to be responding to some type of internal stimuli even though he is denying AH. Judment is poor, concentration is poor, attention span is poor.   Past Psychiatric History: Pt has current Bipolar 1 d/o & ADHD. As per chart review, he has had inpatient admissions at [facility] in 2019, 2022, and most recently in 06/2022. For that admission, pt was discharged home on Invega 9 mg nightly. Today, he initially states that he has been taking his medications at home, then stated he has not been taking them. He is stating that he wants to be discharged home to his mother and that she is waiting at home. At this time, pt is being recommended for inpatient hospitalization at a thought disorder unit for treatment  and stabilization of his mood. Case discussed with Dr. Lucianne Muss during treatment team. Pt's mother called for collateral information, but there was no answer.  Risk to Self or Others: Is the patient at risk to self? Yes Has the patient been a risk to self in the past 6 months? Yes Has the patient been a risk to self within the distant past? No Is the patient a risk to others? Yes Has the patient been a risk to others in the past 6 months? Yes Has the patient been a risk to others within the distant past? Yes  [place] Scale:  Flowsheet Row ED from 07/19/2022 in [facility] Ad[facility] (Dis[facility]) [facility]  C-SSRS RISK CATEGORY No Risk No Risk Error: Question 2 not populated       AIMS: n/a  ASAM:    Substance Abuse:     Past Medical History:  Past Medical History:  Diagnosis Date   Heart murmur    MDD (major depressive disorder), severe (HCC) 08/23/2018   No past surgical history on file. Family History:  Family History  Problem Relation Age of Onset   Hyperlipidemia Father    Family Psychiatric  History: none reported Social History:  Social History   Substance and Sexual Activity  Alcohol Use Never     Social History   Substance and Sexual Activity  Drug Use Never  Social History   Socioeconomic History   Marital status: Single    Spouse name: Not on file   Number of children: Not on file   Years of education: Not on file   Highest education level: Not on file  Occupational History   Not on file  Tobacco Use   Smoking status: Never   Smokeless tobacco: Never  Vaping Use   Vaping Use: Never used  Substance and Sexual Activity   Alcohol use: Never   Drug use: Never   Sexual activity: Never  Other Topics Concern   Not on file  Social History Narrative   01/23/21   From: the area   Living: with parents    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, 1 sibling      Enjoys: running      Exercise: daily running    Diet: meat, veggies, grains      Safety   Seat belts: Yes    Guns: No   Safe in relationships: Yes    Social Determinants of Corporate investment banker Strain: Not on file  Food Insecurity: Not on file  Transportation Needs: Not on file  Physical Activity: Not on file  Stress: Not on file  Social Connections: Not on file   Additional Social History:    Allergies:  No Known Allergies  Labs:  Results for orders placed or performed during the hospital encounter of 07/19/22 (from the past 48 hour(s))  Rapid urine drug screen (hospital performed)     Status: None   Collection Time: 07/19/22  4:50 AM  Result Value Ref Range   Opiates NONE DETECTED NONE DETECTED   Cocaine NONE DETECTED NONE DETECTED   Benzodiazepines NONE DETECTED NONE DETECTED   Amphetamines NONE DETECTED NONE DETECTED   Tetrahydrocannabinol NONE DETECTED NONE DETECTED   Barbiturates NONE DETECTED NONE DETECTED    Comment: (NOTE) DRUG SCREEN FOR MEDICAL PURPOSES ONLY.  IF CONFIRMATION IS NEEDED FOR ANY PURPOSE, NOTIFY LAB WITHIN 5 DAYS.  LOWEST DETECTABLE LIMITS FOR URINE DRUG SCREEN Drug Class                     Cutoff (ng/mL) Amphetamine and metabolites    1000 Barbiturate and metabolites    200 Benzodiazepine                 200 Tricyclics and metabolites     300 Opiates and metabolites        300 Cocaine and metabolites        300 THC                            50 Performed at [facility], [address], [place], [place] [address]   Resp Panel by RT-PCR (Flu A&B, Covid) Anterior Nasal Swab     Status: None   Collection Time: 07/19/22  5:15 AM   Specimen: Anterior Nasal Swab  Result Value Ref Range   SARS Coronavirus 2 by RT PCR NEGATIVE NEGATIVE    Comment: (NOTE) SARS-CoV-2 target nucleic acids are NOT  DETECTED.  The SARS-CoV-2 RNA is generally detectable in upper respiratory specimens during the acute phase of infection. The lowest concentration of SARS-CoV-2 viral copies this assay can detect is 138 copies/mL. A negative result does not preclude SARS-Cov-2 infection and should not be used as the sole  basis for treatment or other patient management decisions. A negative result may occur with  improper specimen collection/handling, submission of specimen other than nasopharyngeal swab, presence of viral mutation(s) within the areas targeted by this assay, and inadequate number of viral copies(<138 copies/mL). A negative result must be combined with clinical observations, patient history, and epidemiological information. The expected result is Negative.  Fact Sheet for Patients:  BloggerCourse.com  Fact Sheet for Healthcare Providers:  SeriousBroker.it  This test is no t yet approved or cleared by the [place] FDA and  has been authorized for detection and/or diagnosis of SARS-CoV-2 by FDA under an Emergency Use Authorization (EUA). This EUA will remain  in effect (meaning this test can be used) for the duration of the COVID-19 declaration under Section 564(b)(1) of the Act, 21 U.S.C.section 360bbb-3(b)(1), unless the authorization is terminated  or revoked sooner.       Influenza A by PCR NEGATIVE NEGATIVE   Influenza B by PCR NEGATIVE NEGATIVE    Comment: (NOTE) The Xpert Xpress SARS-CoV-2/FLU/RSV plus assay is intended as an aid in the diagnosis of influenza from Nasopharyngeal swab specimens and should not be used as a sole basis for treatment. Nasal washings and aspirates are unacceptable for Xpert Xpress SARS-CoV-2/FLU/RSV testing.  Fact Sheet for Patients: BloggerCourse.com  Fact Sheet for Healthcare Providers: SeriousBroker.it  This test is not yet approved or  cleared by the [place] FDA and has been authorized for detection and/or diagnosis of SARS-CoV-2 by FDA under an Emergency Use Authorization (EUA). This EUA will remain in effect (meaning this test can be used) for the duration of the COVID-19 declaration under Section 564(b)(1) of the Act, 21 U.S.C. section 360bbb-3(b)(1), unless the authorization is terminated or revoked.  Performed at [facility], [address] [place], [place] [address]     Current Facility-Administered Medications  Medication Dose Route Frequency Provider Last Rate Last Admin   benztropine (COGENTIN) tablet 0.5 mg  0.5 mg Oral BID Starleen Blue, NP   0.5 mg at 07/19/22 1131   OLANZapine zydis (ZYPREXA) disintegrating tablet 5 mg  5 mg Oral Q8H PRN Starleen Blue, NP       And   LORazepam (ATIVAN) tablet 1 mg  1 mg Oral PRN Starleen Blue, NP       And   ziprasidone (GEODON) injection 20 mg  20 mg Intramuscular PRN Starleen Blue, NP       paliperidone (INVEGA) 24 hr tablet 3 mg  3 mg Oral BID Starleen Blue, NP   3 mg at 07/19/22 1130   traZODone (DESYREL) tablet 50 mg  50 mg Oral QHS PRN Starleen Blue, NP       Current Outpatient Medications  Medication Sig Dispense Refill   benztropine (COGENTIN) 1 MG tablet Take 1 tablet (1 mg total) by mouth 2 (two) times daily. 60 tablet 0   paliperidone (INVEGA) 6 MG 24 hr tablet Take 2 tablets (12 mg total) by mouth daily. 60 tablet 0   traZODone (DESYREL) 50 MG tablet Take 1 tablet (50 mg total) by mouth at bedtime as needed for sleep. 30 tablet 0   Musculoskeletal: Strength & Muscle Tone: within normal limits Gait & Station: normal Patient leans: N/A   Psychiatric Specialty Exam: Presentation  General Appearance: Disheveled  Eye Contact:Good  Speech:Clear and Coherent  Speech Volume:Normal  Handedness:Right   Mood and Affect  Mood:Anxious  Affect:Congruent   Thought Process  Thought Processes:Coherent  Descriptions  of Associations:Intact  Orientation:Partial  Thought Content:Illogical  History of Schizophrenia/Schizoaffective disorder:No  Duration of Psychotic Symptoms:Greater than six months  Hallucinations:Hallucinations: None (sems to be responding to internal stimuli)  Ideas of Reference:Paranoia  Suicidal Thoughts:Suicidal Thoughts: No  Homicidal Thoughts:Homicidal Thoughts: No   Sensorium  Memory:Immediate Good  Judgment:Poor  Insight:Poor   Executive Functions  Concentration:Poor  Attention Span:Poor  Recall:Poor  Fund of Knowledge:Poor  Language:Fair  Psychomotor Activity  Psychomotor Activity:Psychomotor Activity: Normal  Assets  Assets:Housing; Social Support  Sleep  Sleep:Sleep: Poor  Physical Exam: Physical Exam Constitutional:      Appearance: Normal appearance.  HENT:     Head: Normocephalic.     Nose: Nose normal.  Eyes:     Pupils: Pupils are equal, round, and reactive to light.  Pulmonary:     Effort: Pulmonary effort is normal.  Musculoskeletal:        General: Normal range of motion.     Cervical back: Normal range of motion.  Neurological:     Mental Status: He is alert and oriented to person, place, and time.    Review of Systems  Constitutional: Negative.   HENT: Negative.    Eyes: Negative.   Respiratory: Negative.    Cardiovascular: Negative.   Gastrointestinal: Negative.   Genitourinary: Negative.   Musculoskeletal: Negative.   Skin: Negative.   Psychiatric/Behavioral:  Positive for hallucinations (seems to be responding to some internal stimuli, paranoid). Negative for memory loss, substance abuse and suicidal ideas. The patient is nervous/anxious and has insomnia.    Blood pressure (!) 133/96, pulse 92, temperature 98.1 F (36.7 C), temperature source Oral, resp. rate 18, height 5\' 11"  (1.803 m), weight 100.7 kg, SpO2 100 %. Body mass index is 30.96 kg/m.  Medical Decision Making: Recommend inpatient admission, restart  Invega at 6 mg and titrate up as tolerated (unsure if pt was med compliant at home). Restart Trazodone 50 mg nightly PRN, Restart Cogentin at 0.5 mg BID. Start Agitation protocol medications (Zyprexa/Ativan/Geodon PRN)-Please see MAR.  Disposition: Recommend psychiatric Inpatient admission when medically cleared.  , NP 07/19/2022 11:42 AM

## 2022-07-20 DIAGNOSIS — F319 Bipolar disorder, unspecified: Secondary | ICD-10-CM

## 2022-07-20 MED ORDER — STERILE WATER FOR INJECTION IJ SOLN
INTRAMUSCULAR | Status: AC
  Filled 2022-07-20: qty 10

## 2022-07-20 NOTE — Progress Notes (Signed)
Patient has been denied by [facility] due to no appropriate beds available. Patient meets BH inpatient criteria per Hillery Jacks, NP. Patient has been faxed out to the following facilities:    [facility]  [address] [place], [place] [address] [phone]  [facility]  [address] [place] [address] [phone]  [facility]  [address], [place] [address] [phone]  [facility]  [address] [place] [address] [phone]  [facility]  [address], [place] [address] [phone]  [facility]  [address] [place] [address] [phone]  [facility]  [address] [place] [address] [phone]  [facility]  [address] [place], [place] [address] [phone]  [facility]  [address] [place] [address] [phone]   Damita Dunnings, MSW, LCSW-A  10:23 PM 07/20/2022

## 2022-07-20 NOTE — ED Notes (Signed)
Pt in bed with eyes closed, resps even and unlabored, pt in hospital scrubs.

## 2022-07-20 NOTE — Progress Notes (Signed)
BHH/BMU LCSW Progress Note   07/20/2022    11:07 PM  Mario Thomas   [id number]   Type of Contact and Topic:  Psychiatric Bed Placement   Pt accepted to [facility]      Patient meets inpatient criteria per Hillery Jacks, NP  The attending provider will be Landry Mellow, MD   Call report to [phone] or [phone]  Lynett Fish, RN @ [facility] notified.     Pt scheduled  to arrive at [facility] AFTER 0900.    Damita Dunnings, MSW, LCSW-A  11:08 PM 07/20/2022

## 2022-07-20 NOTE — ED Notes (Signed)
Pt is in a pleasant mood. He states that he feels better when he paces back and forth in the hallway.  He is talking about religion and being respectful.

## 2022-07-20 NOTE — ED Notes (Signed)
Pt continues to pace quietly inside his room.  Asked again about when he will transfer.  Pt again informed that things are in process and that it will take time.  Pt again accepting of information shared.  Will monitor.

## 2022-07-20 NOTE — ED Notes (Signed)
Pt moved to room 30, asking about next steps and when he will be transferred to a new hospital.  Process explained to pt, accepting of information shared.  Will monitor.

## 2022-07-20 NOTE — BH Assessment (Signed)
BHH Assessment Progress Note   Per Hillery Jacks, NP, this involuntary pt continues to require psychiatric hospitalization at this time.  The following facilities have been contacted to seek placement for this pt, with results as noted:  Beds available, information sent, decision pending: Armanda Heritage to reach: Earlene Plater (left message at 13:08) Leonette Monarch (left message at 15:47)  At capacity: [facility] (no male beds)  Doylene Canning, [place] Behavioral Health Coordinator [phone]

## 2022-07-20 NOTE — ED Notes (Signed)
Pt awake, pt denies si or hi, pt states that he feels safe here, pt states that he doesn't need anything for anxiety or agitation at this time. Resps even and unlabored.  Pt sitting up in bed, pt calm and cooperative.

## 2022-07-20 NOTE — ED Notes (Signed)
Pt still pacing hallways, rambling to sitter, not making sense. This RN said to pt " sir I am going get you some ativan to relax you" patient states "dont call me sir I am not taking nothing."

## 2022-07-20 NOTE — ED Notes (Signed)
Pt asleep att, will hold off on VS

## 2022-07-20 NOTE — Consult Note (Signed)
[facility] Psych ED Progress Note  07/20/2022 10:13 AM Mario Thomas  MRN:  [id number]   Method of visit?: Face to Face   Subjective:  Ante reported " God has taken care of all the demons."  Evaluation: Mario Thomas has a charted history of bipolar 1 disorder with psychotic features, attention deficit hyperactivity disorder and schizoaffective disorder.  Patient was recently discharged with Invega and Cogentin for mood stabilization.  Unsure if patient has been medication compliant after discharge.  Patient observed pacing the emergency department.  He is denying suicidal or homicidal ideation.  He denied auditory visual hallucinations however he appears to be responding to internal stimuli and slightly restless.  As documented patient refused Zyprexa.  Patient has been refusing medications during this emergency room visit.  Requiring agitation orders this morning.   Per initial assessment note: "21 year old male with a history of bipolar 1 d/o, ADHD presents to the emergency department voluntarily.  Called EMS tonight to be brought to the ED for evaluation.  States that he came because he wanted to "stay here and eat the food".  Reports "knee and psych are friends".  Patient noted to be taking his name stickers and placing them all over his body.  He states that he has been compliant with his prescribed medications.  He denies suicidal and homicidal thoughts.  Further denies alcohol and illicit drug use."  Principal Problem: Bipolar disorder with psychotic features (HCC) Diagnosis:  Principal Problem:   Bipolar disorder with psychotic features (HCC)  Total Time spent with patient: 15 minutes  Past Psychiatric History:   Past Medical History:  Past Medical History:  Diagnosis Date   Heart murmur    MDD (major depressive disorder), severe (HCC) 08/23/2018   No past surgical history on file. Family History:  Family History  Problem Relation Age of Onset   Hyperlipidemia Father    Family Psychiatric   History:  Social History:  Social History   Substance and Sexual Activity  Alcohol Use Never     Social History   Substance and Sexual Activity  Drug Use Never    Social History   Socioeconomic History   Marital status: Single    Spouse name: Not on file   Number of children: Not on file   Years of education: Not on file   Highest education level: Not on file  Occupational History   Not on file  Tobacco Use   Smoking status: Never   Smokeless tobacco: Never  Vaping Use   Vaping Use: Never used  Substance and Sexual Activity   Alcohol use: Never   Drug use: Never   Sexual activity: Never  Other Topics Concern   Not on file  Social History Narrative   01/23/21   From: the area   Living: with parents   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, 1 sibling      Enjoys: running      Exercise: daily running    Diet: meat, veggies, grains      Safety   Seat belts: Yes    Guns: No   Safe in relationships: Yes    Social Determinants of Corporate investment banker Strain: Not on file  Food Insecurity: Not on file  Transportation Needs: Not on file  Physical Activity: Not on file  Stress: Not on file  Social Connections: Not on file    Sleep: Good  Appetite:  Good  Current Medications: Current Facility-Administered Medications  Medication Dose Route Frequency Provider Last Rate Last Admin   benztropine (COGENTIN) tablet 0.5 mg  0.5 mg Oral BID Starleen Blue, NP   0.5 mg at 07/19/22 2301   OLANZapine zydis (ZYPREXA) disintegrating tablet 5 mg  5 mg Oral Q8H PRN Starleen Blue, NP   5 mg at 07/19/22 2316   And   LORazepam (ATIVAN) tablet 1 mg  1 mg Oral PRN Starleen Blue, NP       paliperidone (INVEGA) 24 hr tablet 3 mg  3 mg Oral BID Starleen Blue, NP   3 mg at 07/19/22 2302   traZODone (DESYREL) tablet 50 mg  50 mg Oral QHS PRN Starleen Blue, NP       Current Outpatient Medications   Medication Sig Dispense Refill   benztropine (COGENTIN) 1 MG tablet Take 1 tablet (1 mg total) by mouth 2 (two) times daily. 60 tablet 0   paliperidone (INVEGA) 6 MG 24 hr tablet Take 2 tablets (12 mg total) by mouth daily. 60 tablet 0   traZODone (DESYREL) 50 MG tablet Take 1 tablet (50 mg total) by mouth at bedtime as needed for sleep. 30 tablet 0    Lab Results:  Results for orders placed or performed during the hospital encounter of 07/19/22 (from the past 48 hour(s))  Rapid urine drug screen (hospital performed)     Status: None   Collection Time: 07/19/22  4:50 AM  Result Value Ref Range   Opiates NONE DETECTED NONE DETECTED   Cocaine NONE DETECTED NONE DETECTED   Benzodiazepines NONE DETECTED NONE DETECTED   Amphetamines NONE DETECTED NONE DETECTED   Tetrahydrocannabinol NONE DETECTED NONE DETECTED   Barbiturates NONE DETECTED NONE DETECTED    Comment: (NOTE) DRUG SCREEN FOR MEDICAL PURPOSES ONLY.  IF CONFIRMATION IS NEEDED FOR ANY PURPOSE, NOTIFY LAB WITHIN 5 DAYS.  LOWEST DETECTABLE LIMITS FOR URINE DRUG SCREEN Drug Class                     Cutoff (ng/mL) Amphetamine and metabolites    1000 Barbiturate and metabolites    200 Benzodiazepine                 200 Tricyclics and metabolites     300 Opiates and metabolites        300 Cocaine and metabolites        300 THC                            50 Performed at [facility], [address] [place], [place] [address]   Resp Panel by RT-PCR (Flu A&B, Covid) Anterior Nasal Swab     Status: None   Collection Time: 07/19/22  5:15 AM   Specimen: Anterior Nasal Swab  Result Value Ref Range   SARS Coronavirus 2 by RT PCR NEGATIVE NEGATIVE    Comment: (NOTE) SARS-CoV-2 target nucleic acids are NOT DETECTED.  The SARS-CoV-2 RNA is generally detectable in upper respiratory specimens during the acute phase of infection. The lowest concentration of SARS-CoV-2 viral copies this assay can detect is 138  copies/mL. A negative result does not preclude SARS-Cov-2 infection and should not be used as the sole basis for treatment or other patient management decisions. A negative result may occur with  improper specimen collection/handling, submission of specimen other than nasopharyngeal swab, presence of viral mutation(s) within the areas targeted by  this assay, and inadequate number of viral copies(<138 copies/mL). A negative result must be combined with clinical observations, patient history, and epidemiological information. The expected result is Negative.  Fact Sheet for Patients:  BloggerCourse.com  Fact Sheet for Healthcare Providers:  SeriousBroker.it  This test is no t yet approved or cleared by the [place] FDA and  has been authorized for detection and/or diagnosis of SARS-CoV-2 by FDA under an Emergency Use Authorization (EUA). This EUA will remain  in effect (meaning this test can be used) for the duration of the COVID-19 declaration under Section 564(b)(1) of the Act, 21 U.S.C.section 360bbb-3(b)(1), unless the authorization is terminated  or revoked sooner.       Influenza A by PCR NEGATIVE NEGATIVE   Influenza B by PCR NEGATIVE NEGATIVE    Comment: (NOTE) The Xpert Xpress SARS-CoV-2/FLU/RSV plus assay is intended as an aid in the diagnosis of influenza from Nasopharyngeal swab specimens and should not be used as a sole basis for treatment. Nasal washings and aspirates are unacceptable for Xpert Xpress SARS-CoV-2/FLU/RSV testing.  Fact Sheet for Patients: BloggerCourse.com  Fact Sheet for Healthcare Providers: SeriousBroker.it  This test is not yet approved or cleared by the [place] FDA and has been authorized for detection and/or diagnosis of SARS-CoV-2 by FDA under an Emergency Use Authorization (EUA). This EUA will remain in effect (meaning this test  can be used) for the duration of the COVID-19 declaration under Section 564(b)(1) of the Act, 21 U.S.C. section 360bbb-3(b)(1), unless the authorization is terminated or revoked.  Performed at [facility], [address] [place], [place] [address]   CBC with Differential     Status: None   Collection Time: 07/19/22  5:39 PM  Result Value Ref Range   WBC 6.4 4.0 - 10.5 K/uL   RBC 4.99 4.22 - 5.81 MIL/uL   Hemoglobin 14.9 13.0 - 17.0 g/dL   HCT 95.1 88.4 - 16.6 %   MCV 89.4 80.0 - 100.0 fL   MCH 29.9 26.0 - 34.0 pg   MCHC 33.4 30.0 - 36.0 g/dL   RDW 06.3 01.6 - 01.0 %   Platelets 162 150 - 400 K/uL   nRBC 0.0 0.0 - 0.2 %   Neutrophils Relative % 53 %   Neutro Abs 3.4 1.7 - 7.7 K/uL   Lymphocytes Relative 32 %   Lymphs Abs 2.0 0.7 - 4.0 K/uL   Monocytes Relative 10 %   Monocytes Absolute 0.7 0.1 - 1.0 K/uL   Eosinophils Relative 4 %   Eosinophils Absolute 0.2 0.0 - 0.5 K/uL   Basophils Relative 1 %   Basophils Absolute 0.1 0.0 - 0.1 K/uL   Immature Granulocytes 0 %   Abs Immature Granulocytes 0.01 0.00 - 0.07 K/uL    Comment: Performed at [facility], [address] [place], [place] [address]  Comprehensive metabolic panel     Status: None   Collection Time: 07/19/22  5:39 PM  Result Value Ref Range   Sodium 139 135 - 145 mmol/L   Potassium 3.9 3.5 - 5.1 mmol/L   Chloride 107 98 - 111 mmol/L   CO2 26 22 - 32 mmol/L   Glucose, Bld 90 70 - 99 mg/dL    Comment: Glucose reference range applies only to samples taken after fasting for at least 8 hours.   BUN 12 6 - 20 mg/dL   Creatinine, Ser 5.73 0.61 - 1.24 mg/dL   Calcium 9.1 8.9 - 22.0 mg/dL  Total Protein 6.8 6.5 - 8.1 g/dL   Albumin 3.9 3.5 - 5.0 g/dL   AST 19 15 - 41 U/L   ALT 15 0 - 44 U/L   Alkaline Phosphatase 68 38 - 126 U/L   Total Bilirubin 0.6 0.3 - 1.2 mg/dL   GFR, Estimated >66 >29 mL/min    Comment: (NOTE) Calculated using the CKD-EPI Creatinine Equation (2021)     Anion gap 6 5 - 15    Comment: Performed at [facility], [address] [place], [place] [address]  Ethanol     Status: None   Collection Time: 07/19/22  5:39 PM  Result Value Ref Range   Alcohol, Ethyl (B) <10 <10 mg/dL    Comment: (NOTE) Lowest detectable limit for serum alcohol is 10 mg/dL.  For medical purposes only. Performed at [facility], [address], [place], [place] [address]     Blood Alcohol level:  Lab Results  Component Value Date   ETH <10 07/19/2022   ETH <10 11/08/2021    Physical Findings: AIMS:  , ,  ,  ,    CIWA:    COWS:     Musculoskeletal: Strength & Muscle Tone: within normal limits Gait & Station: normal Patient leans: N/A  Psychiatric Specialty Exam:  Presentation  General Appearance: Disheveled  Eye Contact:Good  Speech:Clear and Coherent  Speech Volume:Normal  Handedness:Right   Mood and Affect  Mood:Anxious  Affect:Congruent   Thought Process  Thought Processes:Coherent  Descriptions of Associations:Intact  Orientation:Partial  Thought Content:Illogical  History of Schizophrenia/Schizoaffective disorder:No  Duration of Psychotic Symptoms:Greater than six months  Hallucinations:Hallucinations: None (sems to be responding to internal stimuli)  Ideas of Reference:Paranoia  Suicidal Thoughts:Suicidal Thoughts: No  Homicidal Thoughts:Homicidal Thoughts: No   Sensorium  Memory:Immediate Good  Judgment:Poor  Insight:Poor   Executive Functions  Concentration:Poor  Attention Span:Poor  Recall:Poor  Fund of Knowledge:Poor  Language:Fair   Psychomotor Activity  Psychomotor Activity:Psychomotor Activity: Normal   Assets  Assets:Housing; Social Support   Sleep  Sleep:Sleep: Poor    Physical Exam: Physical Exam Vitals and nursing note reviewed.  Constitutional:      Appearance: Normal appearance.  Cardiovascular:     Rate and Rhythm: Normal  rate and regular rhythm.  Neurological:     Mental Status: He is alert and oriented to person, place, and time.  Psychiatric:        Mood and Affect: Mood normal.        Behavior: Behavior normal.    Review of Systems  Cardiovascular: Negative.   Musculoskeletal: Negative.   Psychiatric/Behavioral:  Positive for hallucinations. The patient is nervous/anxious.   All other systems reviewed and are negative.  Blood pressure 133/80, pulse 80, temperature 98 F (36.7 C), temperature source Oral, resp. rate 14, height 5\' 11"  (1.803 m), weight 100.7 kg, SpO2 98 %. Body mass index is 30.96 kg/m.  Treatment Plan Summary: Daily contact with patient to assess and evaluate symptoms and progress in treatment, Medication management, and Plan continue seeking inpatient admission  Continue Invega 3 mg po BID  -Continue cogentin 0.5 mg   , NP 07/20/2022, 10:13 AM

## 2022-07-21 NOTE — ED Provider Notes (Signed)
Emergency Medicine Observation Re-evaluation Note  Mario Thomas is a 21 y.o. male, seen on rounds today.  Pt initially presented to the ED for complaints of Psychiatric Evaluation Currently, the patient is just waking up for the morning, denies complaints.  Physical Exam  BP (!) 131/99 (BP Location: Right Arm)   Pulse 62   Temp (!) 97.5 F (36.4 C) (Oral)   Resp 18   Ht 5\' 11"  (1.803 m)   Wt 100.7 kg   SpO2 99%   BMI 30.96 kg/m  Physical Exam General: Normal appearance, no acute distress Cardiac: Regular rate Lungs: No respiratory distress Psych: Calm, cooperative  ED Course / MDM  EKG:   I have reviewed the labs performed to date as well as medications administered while in observation.  Recent changes in the last 24 hours include accepted to [facility].  Plan  Current plan is for psychiatric placement, likely will be transferred to [facility] facility today. Mario Thomas is under involuntary commitment.      [facility], MD 07/21/22 [phone]

## 2022-07-21 NOTE — ED Notes (Signed)
Report to [facility], [place], [place].  Left message for transport.

## 2022-07-21 NOTE — ED Notes (Signed)
Attempted to call report to [facility], no answer at this time, will attempt again.

## 2022-08-15 ENCOUNTER — Ambulatory Visit (HOSPITAL_COMMUNITY): Payer: 59 | Admitting: Psychiatry

## 2022-08-18 ENCOUNTER — Other Ambulatory Visit: Payer: Self-pay

## 2022-08-18 ENCOUNTER — Emergency Department (HOSPITAL_BASED_OUTPATIENT_CLINIC_OR_DEPARTMENT_OTHER)
Admission: EM | Admit: 2022-08-18 | Discharge: 2022-08-20 | Disposition: A | Discharge status: Other Acute Inpatient Hospital | Payer: 59 | Attending: Emergency Medicine | Admitting: Emergency Medicine

## 2022-08-18 ENCOUNTER — Encounter (HOSPITAL_BASED_OUTPATIENT_CLINIC_OR_DEPARTMENT_OTHER): Payer: Self-pay

## 2022-08-18 ENCOUNTER — Emergency Department (HOSPITAL_BASED_OUTPATIENT_CLINIC_OR_DEPARTMENT_OTHER): Payer: 59

## 2022-08-18 DIAGNOSIS — R000 Tachycardia, unspecified: Secondary | ICD-10-CM | POA: Insufficient documentation

## 2022-08-18 DIAGNOSIS — R4182 Altered mental status, unspecified: Secondary | ICD-10-CM | POA: Diagnosis present

## 2022-08-18 DIAGNOSIS — Y900 Blood alcohol level of less than 20 mg/100 ml: Secondary | ICD-10-CM | POA: Diagnosis not present

## 2022-08-18 DIAGNOSIS — Z20822 Contact with and (suspected) exposure to covid-19: Secondary | ICD-10-CM | POA: Insufficient documentation

## 2022-08-18 DIAGNOSIS — F29 Unspecified psychosis not due to a substance or known physiological condition: Secondary | ICD-10-CM | POA: Insufficient documentation

## 2022-08-18 DIAGNOSIS — Z046 Encounter for general psychiatric examination, requested by authority: Secondary | ICD-10-CM | POA: Insufficient documentation

## 2022-08-18 DIAGNOSIS — F259 Schizoaffective disorder, unspecified: Secondary | ICD-10-CM | POA: Diagnosis present

## 2022-08-18 LAB — RAPID URINE DRUG SCREEN, HOSP PERFORMED
Amphetamines: NOT DETECTED
Barbiturates: NOT DETECTED
Benzodiazepines: NOT DETECTED
Cocaine: NOT DETECTED
Opiates: NOT DETECTED
Tetrahydrocannabinol: NOT DETECTED

## 2022-08-18 LAB — CK: Total CK: 102 U/L (ref 49–397)

## 2022-08-18 LAB — CBC WITH DIFFERENTIAL/PLATELET
Abs Immature Granulocytes: 0.04 10*3/uL (ref 0.00–0.07)
Basophils Absolute: 0 10*3/uL (ref 0.0–0.1)
Basophils Relative: 0 %
Eosinophils Absolute: 0 10*3/uL (ref 0.0–0.5)
Eosinophils Relative: 0 %
HCT: 47.3 % (ref 39.0–52.0)
Hemoglobin: 16.2 g/dL (ref 13.0–17.0)
Immature Granulocytes: 0 %
Lymphocytes Relative: 7 %
Lymphs Abs: 0.9 10*3/uL (ref 0.7–4.0)
MCH: 30.4 pg (ref 26.0–34.0)
MCHC: 34.2 g/dL (ref 30.0–36.0)
MCV: 88.7 fL (ref 80.0–100.0)
Monocytes Absolute: 0.5 10*3/uL (ref 0.1–1.0)
Monocytes Relative: 5 %
Neutro Abs: 10.5 10*3/uL — ABNORMAL HIGH (ref 1.7–7.7)
Neutrophils Relative %: 88 %
Platelets: 112 10*3/uL — ABNORMAL LOW (ref 150–400)
RBC: 5.33 MIL/uL (ref 4.22–5.81)
RDW: 12.9 % (ref 11.5–15.5)
WBC: 12 10*3/uL — ABNORMAL HIGH (ref 4.0–10.5)
nRBC: 0 % (ref 0.0–0.2)

## 2022-08-18 LAB — COMPREHENSIVE METABOLIC PANEL
ALT: 12 U/L (ref 0–44)
AST: 17 U/L (ref 15–41)
Albumin: 5.2 g/dL — ABNORMAL HIGH (ref 3.5–5.0)
Alkaline Phosphatase: 75 U/L (ref 38–126)
Anion gap: 17 — ABNORMAL HIGH (ref 5–15)
BUN: 15 mg/dL (ref 6–20)
CO2: 22 mmol/L (ref 22–32)
Calcium: 10.1 mg/dL (ref 8.9–10.3)
Chloride: 99 mmol/L (ref 98–111)
Creatinine, Ser: 1.05 mg/dL (ref 0.61–1.24)
GFR, Estimated: >60 mL/min (ref >60)
Glucose, Bld: 176 mg/dL — ABNORMAL HIGH (ref 70–99)
Potassium: 3.6 mmol/L (ref 3.5–5.1)
Sodium: 138 mmol/L (ref 135–145)
Total Bilirubin: 0.4 mg/dL (ref 0.3–1.2)
Total Protein: 7.9 g/dL (ref 6.5–8.1)

## 2022-08-18 LAB — URINALYSIS, ROUTINE W REFLEX MICROSCOPIC
Bilirubin Urine: NEGATIVE
Glucose, UA: NEGATIVE mg/dL
Hgb urine dipstick: NEGATIVE
Ketones, ur: NEGATIVE mg/dL
Leukocytes,Ua: NEGATIVE
Nitrite: NEGATIVE
Protein, ur: 30 mg/dL — AB
Specific Gravity, Urine: 1.017 (ref 1.005–1.030)
pH: 5.5 (ref 5.0–8.0)

## 2022-08-18 LAB — ETHANOL: Alcohol, Ethyl (B): <10 mg/dL (ref <10)

## 2022-08-18 LAB — SALICYLATE LEVEL: Salicylate Lvl: <7 mg/dL — ABNORMAL LOW (ref 7.0–30.0)

## 2022-08-18 LAB — ACETAMINOPHEN LEVEL: Acetaminophen (Tylenol), Serum: <10 ug/mL — ABNORMAL LOW (ref 10–30)

## 2022-08-18 MED ORDER — SODIUM CHLORIDE 0.9 % IV BOLUS
1000.0000 mL | Freq: Once | INTRAVENOUS | Status: AC
Start: 2022-08-18 — End: 2022-08-18
  Administered 2022-08-18: 1000 mL via INTRAVENOUS

## 2022-08-18 MED ORDER — SODIUM CHLORIDE 0.9 % IV BOLUS
1000.0000 mL | Freq: Once | INTRAVENOUS | Status: AC
  Administered 2022-08-18: 1000 mL via INTRAVENOUS

## 2022-08-18 MED ORDER — LORAZEPAM 2 MG/ML IJ SOLN
1.0000 mg | Freq: Once | INTRAMUSCULAR | Status: AC
  Administered 2022-08-18: 1 mg via INTRAVENOUS
  Filled 2022-08-18: qty 1

## 2022-08-18 NOTE — ED Notes (Signed)
Per pt's mother and staff member from sober living home, pt stated he drank clorox. When attempting to gather more information from pt, pt endorses auditory and visual hallucinations and states he felt he needed to kill himself. Pt tremulous and not making eye contact. Pt also speaking in nonsensical sentences at times.

## 2022-08-18 NOTE — ED Notes (Signed)
Pt. Roommate called this facility and states that nothing, including medications, are missing from their room.

## 2022-08-18 NOTE — ED Notes (Signed)
TelePsych in with patient at this time.

## 2022-08-18 NOTE — BH Assessment (Incomplete)
Attempted tele-assessment. Pt is awake and looking about the room. He does not respond to any questions. He does not confirm his name. At one point he mumbled "Monday, Tuesday, Thursday."

## 2022-08-18 NOTE — ED Provider Notes (Signed)
MEDCENTER [facility] EMERGENCY DEPT Provider Note  CSN: [id number] Arrival date & time: 08/18/22 1916  Chief Complaint(s) Altered Mental Status  HPI Mario Thomas is a 21 y.o. male with history of bipolar disorder and schizophrenia presenting to the emergency department for altered mental status.  Patient is currently in sober living, today noted to have very abnormal mental status, was naked in his room, not making any sense, alternatively telling people that he had drink Clorox or taking sleeping pills but not making much sense or having consistent history about this.  He also was heard to possibly fall down the stairs although no one saw him fall they just heard a loud noise.  History significantly limited by altered mental status.  Patient has a very abnormal train of  thought.   Past Medical History Past Medical History:  Diagnosis Date   Heart murmur    MDD (major depressive disorder), severe (HCC) 08/23/2018   Patient Active Problem List   Diagnosis Date Noted   GAD (generalized anxiety disorder) 06/12/2022   Insomnia 06/12/2022   Alcohol use 06/12/2022   Bipolar disorder with psychotic features (HCC) 11/09/2021   Involuntary commitment 11/06/2021   Allergic rhinitis 05/09/2012   Functional heart murmur 04/04/2012   Home Medication(s) Prior to Admission medications   Medication Sig Start Date End Date Taking? Authorizing Provider  benztropine (COGENTIN) 1 MG tablet Take 1 tablet (1 mg total) by mouth 2 (two) times daily. 06/20/22   Starleen Blue, NP  paliperidone (INVEGA) 6 MG 24 hr tablet Take 2 tablets (12 mg total) by mouth daily. 06/20/22   Starleen Blue, NP  traZODone (DESYREL) 50 MG tablet Take 1 tablet (50 mg total) by mouth at bedtime as needed for sleep. 06/20/22   Starleen Blue, NP                                                                                                                                    Past Surgical History History reviewed. No pertinent  surgical history. Family History Family History  Problem Relation Age of Onset   Hyperlipidemia Father     Social History Social History   Tobacco Use   Smoking status: Never   Smokeless tobacco: Never  Vaping Use   Vaping Use: Never used  Substance Use Topics   Alcohol use: Not Currently   Drug use: Never    Comment: Denies   Allergies Patient has no known allergies.  Review of Systems Review of Systems  Unable to perform ROS: Psychiatric disorder    Physical Exam Vital Signs  I have reviewed the triage vital signs BP (!) 146/91   Pulse (!) 122   Temp 98.5 F (36.9 C) (Oral)   Resp (!) 33   Ht 5\' 11"  (1.803 m)   Wt 95.5 kg   SpO2 99%   BMI 29.36 kg/m  Physical Exam Vitals and nursing note reviewed.  Constitutional:  General: He is not in acute distress.    Appearance: Normal appearance.  HENT:     Mouth/Throat:     Mouth: Mucous membranes are dry.  Eyes:     Conjunctiva/sclera: Conjunctivae normal.  Cardiovascular:     Rate and Rhythm: Regular rhythm. Tachycardia present.  Pulmonary:     Effort: Pulmonary effort is normal. No respiratory distress.     Breath sounds: Normal breath sounds.  Abdominal:     General: Abdomen is flat.     Palpations: Abdomen is soft.     Tenderness: There is no abdominal tenderness.  Musculoskeletal:     Right lower leg: No edema.     Left lower leg: No edema.  Skin:    General: Skin is warm and dry.     Capillary Refill: Capillary refill takes less than 2 seconds.  Neurological:     Mental Status: He is alert.     Comments: Unable to assess orientation due to extremely tangential thought process.  Follows commands in all extremities, no facial droop  Psychiatric:     Comments: Extremely tangential thought process, very difficult to follow, with pressured speech, endorses suicidal ideation although unable to assess plan as his thought process is so tangential he starts talking about something else.  Appears to be  reacting actively to internal stimuli     ED Results and Treatments Labs (all labs ordered are listed, but only abnormal results are displayed) Labs Reviewed  COMPREHENSIVE METABOLIC PANEL - Abnormal; Notable for the following components:      Result Value   Glucose, Bld 176 (*)    Albumin 5.2 (*)    Anion gap 17 (*)    All other components within normal limits  URINALYSIS, ROUTINE W REFLEX MICROSCOPIC - Abnormal; Notable for the following components:   Protein, ur 30 (*)    All other components within normal limits  CBC WITH DIFFERENTIAL/PLATELET - Abnormal; Notable for the following components:   WBC 12.0 (*)    Platelets 112 (*)    Neutro Abs 10.5 (*)    All other components within normal limits  SALICYLATE LEVEL - Abnormal; Notable for the following components:   Salicylate Lvl <7.0 (*)    All other components within normal limits  ACETAMINOPHEN LEVEL - Abnormal; Notable for the following components:   Acetaminophen (Tylenol), Serum <10 (*)    All other components within normal limits  RAPID URINE DRUG SCREEN, HOSP PERFORMED  ETHANOL  CK  D-DIMER, QUANTITATIVE  TSH                                                                                                                          Radiology CT Head Wo Contrast  Result Date: 08/18/2022 CLINICAL DATA:  Possible overdose on sleeping pills, altered mental status EXAM: CT HEAD WITHOUT CONTRAST TECHNIQUE: Contiguous axial images were obtained from the base of the skull through the vertex without intravenous contrast. RADIATION DOSE REDUCTION: This exam  was performed according to the departmental dose-optimization program which includes automated exposure control, adjustment of the mA and/or kV according to patient size and/or use of iterative reconstruction technique. COMPARISON:  11/09/2021 FINDINGS: Brain: No evidence of acute infarction, hemorrhage, mass, mass effect, or midline shift. No hydrocephalus or extra-axial fluid  collection. Gray-white differentiation is preserved. Vascular: No hyperdense vessel. Skull: Normal. Negative for fracture or focal lesion. Sinuses/Orbits: Mucosal thickening in the ethmoid air cells. The orbits are unremarkable. Other: The mastoid air cells are well aerated. Debris in the bilateral external auditory canals, likely cerumen. IMPRESSION: No acute intracranial process. Electronically Signed   By: Wiliam Ke M.D.   On: 08/18/2022 21:15    Pertinent labs & imaging results that were available during my care of the patient were reviewed by me and considered in my medical decision making (see MDM for details).  Medications Ordered in ED Medications  LORazepam (ATIVAN) injection 1 mg (1 mg Intravenous Given 08/18/22 2055)  sodium chloride 0.9 % bolus 1,000 mL ( Intravenous Stopped 08/18/22 2221)  LORazepam (ATIVAN) injection 1 mg (1 mg Intravenous Given 08/18/22 2147)  sodium chloride 0.9 % bolus 1,000 mL (1,000 mLs Intravenous New Bag/Given 08/18/22 2240)  LORazepam (ATIVAN) injection 1 mg (1 mg Intravenous Given 08/18/22 2237)                                                                                                                                     Procedures Procedures  (including critical care time)  Medical Decision Making / ED Course   MDM:  21 year old male presenting to the emergency department with altered mental status.  Patient with notable tachycardia, some hypertension.  Mental status exam with very pressured speech and incredibly difficult to follow train of thought.  Suspect patient is actively psychotic, given vital sign abnormalities this could be in the setting of a drug intoxication, will obtain urine drug screen.  No fever, lower concern for infection.  Will give Ativan given patient appears somewhat agitated.  Will reassess closely.  No rigidity to suggest NMS or serotonin syndrome.  Very unclear history of ingestion, patient may have had access to trazodone  but he is so disorganized there is no clear ingestion history and staff from his facility do not know if he took anything.  They will try to find out.  Unclear if patient took Clorox as reported as he constantly changing his stories, but no stridor, no difficulty swallowing. Patient will likely need IVC if medically cleared.   Clinical Course as of 08/18/22 2350  Sat Aug 18, 2022  2347 Signed out pending improvement in VS for medical clearance, TTS consult. Placed on IVC given suicidality and psychosis [WS]    Clinical Course User Index [WS] Lonell Grandchild, MD     Additional history obtained: -Additional history obtained from family and caregiver -External records from outside source obtained and reviewed including: Chart review  including previous notes, labs, imaging, consultation notes   Lab Tests: -I ordered, reviewed, and interpreted labs.   The pertinent results include:   Labs Reviewed  COMPREHENSIVE METABOLIC PANEL - Abnormal; Notable for the following components:      Result Value   Glucose, Bld 176 (*)    Albumin 5.2 (*)    Anion gap 17 (*)    All other components within normal limits  URINALYSIS, ROUTINE W REFLEX MICROSCOPIC - Abnormal; Notable for the following components:   Protein, ur 30 (*)    All other components within normal limits  CBC WITH DIFFERENTIAL/PLATELET - Abnormal; Notable for the following components:   WBC 12.0 (*)    Platelets 112 (*)    Neutro Abs 10.5 (*)    All other components within normal limits  SALICYLATE LEVEL - Abnormal; Notable for the following components:   Salicylate Lvl <7.0 (*)    All other components within normal limits  ACETAMINOPHEN LEVEL - Abnormal; Notable for the following components:   Acetaminophen (Tylenol), Serum <10 (*)    All other components within normal limits  RAPID URINE DRUG SCREEN, HOSP PERFORMED  ETHANOL  CK  D-DIMER, QUANTITATIVE  TSH      EKG   EKG Interpretation  Date/Time:  Saturday  August 18 2022 20:27:15 EDT Ventricular Rate:  147 PR Interval:  131 QRS Duration: 78 QT Interval:  253 QTC Calculation: 396 R Axis:   64 Text Interpretation: Sinus tachycardia Probable left atrial enlargement Nonspecific T abnormalities, lateral leads Baseline wander in lead(s) V2 Confirmed by Alvino Blood ([id number]) on 08/18/2022 11:17:07 PM         Imaging Studies ordered: I ordered imaging studies including CT brain On my interpretation imaging demonstrates no acute process I independently visualized and interpreted imaging. I agree with the radiologist interpretation   Medicines ordered and prescription drug management: Meds ordered this encounter  Medications   LORazepam (ATIVAN) injection 1 mg   sodium chloride 0.9 % bolus 1,000 mL   LORazepam (ATIVAN) injection 1 mg   sodium chloride 0.9 % bolus 1,000 mL   LORazepam (ATIVAN) injection 1 mg    -I have reviewed the patients home medicines and have made adjustments as needed   Consultations Obtained: I requested consultation with the TTS team,  and discussed lab and imaging findings as well as pertinent plan - they recommend: pending at sign out   Cardiac Monitoring: The patient was maintained on a cardiac monitor.  I personally viewed and interpreted the cardiac monitored which showed an underlying rhythm of: sinus tachycardia   Reevaluation: After the interventions noted above, I reevaluated the patient and found that they have improved  Co morbidities that complicate the patient evaluation  Past Medical History:  Diagnosis Date   Heart murmur    MDD (major depressive disorder), severe (HCC) 08/23/2018      Dispostion: Pending at sign out    Final Clinical Impression(s) / ED Diagnoses Final diagnoses:  Sinus tachycardia  Psychosis, unspecified psychosis type (HCC)     This chart was dictated using voice recognition software.  Despite best efforts to proofread,  errors can occur which can  change the documentation meaning.    Lonell Grandchild, MD 08/18/22 2350
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
## 2022-08-18 NOTE — ED Notes (Signed)
Pt. Urinated on himself and the floor, patient changed into new scrubs at this time.

## 2022-08-18 NOTE — ED Notes (Signed)
Pt placed in burgundy scrubs. Pt's shirt, pants,socks, and shoes removed and placed in a belongings bag. Pt and his belongings were wanded by security. Pt. Mother and Father remain at his bedside.

## 2022-08-18 NOTE — BH Assessment (Signed)
Attempted tele-assessment. Pt is awake and looking about the room. He does not respond to any questions. He does not confirm his name. At one point he mumbled "Monday, Tuesday, Thursday." Unable to complete CCA.  Based on Pt's current presentation, collateral information, and Pt's psychiatric history, Cecilio Asper, NP recommends inpatient psychiatric treatment. Binnie Rail, [facility] at [facility], said no appropriate beds are currently available and there will probably not be a bed available until Monday. Theda Belfast, AC at [facility] [place], said Pt is not appropriate for continuous assessment or FBC due to acute psychosis. Other facilities will be contacted for placement.  Notified Dr. Susy Frizzle and Jannet Askew, RN of recommendation via secure message.   Pamalee Leyden, [facility], [facility] Triage Specialist [phone]

## 2022-08-19 DIAGNOSIS — F259 Schizoaffective disorder, unspecified: Secondary | ICD-10-CM | POA: Diagnosis present

## 2022-08-19 LAB — D-DIMER, QUANTITATIVE: D-Dimer, Quant: 0.35 ug/mL-FEU (ref 0.00–0.50)

## 2022-08-19 LAB — RESP PANEL BY RT-PCR (FLU A&B, COVID) ARPGX2
Influenza A by PCR: NEGATIVE
Influenza B by PCR: NEGATIVE
SARS Coronavirus 2 by RT PCR: NEGATIVE

## 2022-08-19 LAB — TSH: TSH: 7.935 u[IU]/mL — ABNORMAL HIGH (ref 0.350–4.500)

## 2022-08-19 MED ORDER — BENZTROPINE MESYLATE 0.5 MG PO TABS
1.0000 mg | ORAL_TABLET | Freq: Two times a day (BID) | ORAL | Status: DC
  Administered 2022-08-19 – 2022-08-20 (×2): 1 mg via ORAL
  Filled 2022-08-19 (×2): qty 2
  Filled 2022-08-19 (×2): qty 1

## 2022-08-19 MED ORDER — ZIPRASIDONE MESYLATE 20 MG IM SOLR
20.0000 mg | Freq: Once | INTRAMUSCULAR | Status: AC
  Administered 2022-08-19: 20 mg via INTRAMUSCULAR
  Filled 2022-08-19: qty 20

## 2022-08-19 MED ORDER — ZIPRASIDONE MESYLATE 20 MG IM SOLR
20.0000 mg | Freq: Once | INTRAMUSCULAR | Status: AC
  Administered 2022-08-19: 20 mg via INTRAMUSCULAR

## 2022-08-19 MED ORDER — OLANZAPINE 10 MG PO TBDP
10.0000 mg | ORAL_TABLET | Freq: Three times a day (TID) | ORAL | Status: DC | PRN
  Administered 2022-08-19 – 2022-08-20 (×2): 10 mg via ORAL
  Filled 2022-08-19: qty 1
  Filled 2022-08-19: qty 2

## 2022-08-19 MED ORDER — PALIPERIDONE ER 3 MG PO TB24
3.0000 mg | ORAL_TABLET | Freq: Every day | ORAL | Status: DC
  Administered 2022-08-20: 3 mg via ORAL
  Filled 2022-08-19 (×3): qty 1

## 2022-08-19 MED ORDER — PALIPERIDONE ER 6 MG PO TB24
12.0000 mg | ORAL_TABLET | Freq: Every day | ORAL | Status: DC
  Filled 2022-08-19: qty 2

## 2022-08-19 MED ORDER — LORAZEPAM 1 MG PO TABS
1.0000 mg | ORAL_TABLET | ORAL | Status: AC | PRN
Start: 2022-08-19 — End: 2022-08-19
  Administered 2022-08-19: 1 mg via ORAL
  Filled 2022-08-19: qty 1

## 2022-08-19 MED ORDER — ZIPRASIDONE MESYLATE 20 MG IM SOLR
20.0000 mg | INTRAMUSCULAR | Status: DC | PRN
Start: 2022-08-19 — End: 2022-08-20
  Filled 2022-08-19: qty 20

## 2022-08-19 MED ORDER — TRAZODONE HCL 100 MG PO TABS
50.0000 mg | ORAL_TABLET | Freq: Every evening | ORAL | Status: DC | PRN
  Filled 2022-08-19: qty 1

## 2022-08-19 MED ORDER — HALOPERIDOL LACTATE 5 MG/ML IJ SOLN
5.0000 mg | Freq: Once | INTRAMUSCULAR | Status: AC
  Administered 2022-08-19: 5 mg via INTRAVENOUS
  Filled 2022-08-19: qty 1

## 2022-08-19 NOTE — ED Notes (Signed)
Patient came via police from [facility] with IVC papers completed on 08/19/22. Papers will expire on 08/26/22.

## 2022-08-19 NOTE — ED Notes (Signed)
Open chart to review with receiving nurse at [facility]

## 2022-08-19 NOTE — ED Notes (Addendum)
Family updated as to patient's status. Mother also given visiting hours of 0830-0900, 1200-1230 and 1700-1730. Mother also advised they will have to leave all belongings in their car or left with security. Mother reports she understand policy

## 2022-08-19 NOTE — ED Provider Notes (Signed)
Patient seen on arrival as a transfer to our facility.  Patient in no distress, states that he is comfortable.  He is requesting a notebook to keep himself busy.   Gerhard Munch, MD 08/19/22 [phone]

## 2022-08-19 NOTE — Progress Notes (Signed)
Per Maxie Barb, NP, patient meets criteria for inpatient treatment. There are no available beds at [facility] today. CSW faxed referrals to the following facilities for review:  [facility]  Pending - No Request Sent N/A [address], [place] [address] [phone] --  [facility]  Pending - No Request Sent N/A [address] [place] [address] [phone] --  [facility]  Pending - No Request [address] [place] [address] [phone] --  [facility]  Pending - No Request Sent N/A [address] [place] [address] [phone] --  [facility]  Pending - No Request Sent N/A [address] [place], [place] [address] [phone] --  [facility]  Pending - No Request Sent N/A [address] [place] [address] [phone] --  [facility]  Pending - No Request Sent N/A [address], [place] [address] [phone] --  [facility]  Pending - No Request Sent N/A [address] [place] [address] [phone] --  [facility]  Pending - No Request Sent N/A [address] [place] [address] [phone] --  [facility]  Pending - No Request Sent N/A [address] [place] [address] [phone] --  [facility]  Pending - No Request Sent N/A [address] [place] [address] [phone] --  [facility]  Pending - No Request Sent N/A [address] [name] [place] [address] [phone] --  [facility]  Pending - No Request Sent N/A [address] [place] [address] [phone] --  [facility]  Pending - No Request Sent N/A [address] [place] [address] [phone] --   TTS will continue to seek bed placement.  Crissie Reese, MSW, Lenice Pressman Phone: [phone] Disposition/TOC
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
## 2022-08-19 NOTE — ED Notes (Signed)
Upon being given the choice between I.M. medication and p.o., he chose to take his p.o. medications. Dr. Gardiner Rhyme is aware and instructs [place] to place the Geodon on hold.

## 2022-08-19 NOTE — ED Notes (Signed)
Mother at bedside, pt is now pacing the room, attempting to leave the room. Tanya, EMT reinforcing the rules that patient has to stay in room

## 2022-08-19 NOTE — ED Notes (Signed)
Pt refusing his medications, states, "they all make me feel limp and I can't walk around."

## 2022-08-19 NOTE — Consult Note (Signed)
Telepsych Consultation   Reason for Consult:  psychosis Referring Physician:  Lonell Grandchild, MD Location of Patient: [facility] [phone] Location of Provider: Behavioral Health TTS Department  Patient Identification: Mario Thomas MRN:  [id number] Principal Diagnosis: Schizoaffective disorder (HCC) Diagnosis:  Principal Problem:   Schizoaffective disorder (HCC)   Total Time spent with patient: 20 minutes  Subjective:   Mario Thomas is a 21 y.o. male patient admitted with psychosis.  Patient presents sitting in bed. Alert, withdrawn, and oriented to person, place (hospital), and partial to time. Situation: "I attempted suicide. Just a lot of things and I was looking to more things out of life but life is not giving it to me. Alternate realities. I see suicide as an option that's all". Active thought blocking noted with frequent pauses while speaking.   Reports working at [employer] for past month. Unable to state current outpatient provider. Says he lives "with group of five people"; per chart review pt has been living in an [address]. Reports parents live in [place], [place]. Says he was taking medications "a little bit", then reports not taking medication since "release from hospital" in August. When asked about auditory hallucinations patient initially responded, "I don't know how to answer that question", then responded "yes". Patient states he has no intention of taking medication and that he would "prefer alternative methods" in treating his mental illness. He endorses thoughts of wanting to harm himself; unclear on homicidal ideations and visual hallucinations as he does appear to be responding to some external/internal stimuli. Patient did begin to ask to be discharged and provider attempted to explain that he was under IVC and safety concern surrounding the decision, patient unable to process at this time due to current state.Marland Kitchen   HPI:  Mario Thomas is a 21 year old male patient with a past psychiatric  history of schizoaffective disorder bipolar type who presented to [facility] ED with altered mental status. Per chart review patient presented with members of sober living home where he was reportedly naked in his room, telling people he had drunken Clorox or taken sleeping pills, and talking incoherently. Patient presented to [facility] 07/19/22 where he was transferred to [facility] 07/21/22, was discharged from [facility] 06/20/22 with multiple ED encounters over past year; first presented to [facility] 08/2018 with new onset of behavioral concerns while in high school, concussion 10/2017. UDS-, BAL<10. Salicylate level<7.0. PDMP reviewed, no active prescriptions noted.   Past Psychiatric History: schizoaffective disorder bipolar type, bipolar disorder with psychotic features.  Risk to Self:   Risk to Others:   Prior Inpatient Therapy:   Prior Outpatient Therapy:    Past Medical History:  Past Medical History:  Diagnosis Date   Heart murmur    MDD (major depressive disorder), severe (HCC) 08/23/2018   History reviewed. No pertinent surgical history. Family History:  Family History  Problem Relation Age of Onset   Hyperlipidemia Father    Family Psychiatric  History: not noted Social History:  Social History   Substance and Sexual Activity  Alcohol Use Not Currently     Social History   Substance and Sexual Activity  Drug Use Never   Comment: Denies    Social History   Socioeconomic History   Marital status: Single    Spouse name: Not on file   Number of children: Not on file   Years of education: Not on file   Highest education level: Not on file  Occupational History   Not on file  Tobacco Use  Smoking status: Never   Smokeless tobacco: Never  Vaping Use   Vaping Use: Never used  Substance and Sexual Activity   Alcohol use: Not Currently   Drug use: Never    Comment: Denies   Sexual activity: Never  Other Topics Concern   Not on file  Social History Narrative   01/23/21   From: the  area   Living: with parents   Work: Counselling psychologist[employer]   School: Actuarylectrical engineering at [employer]      Family: good relationship with parents, 1 sibling      Enjoys: running      Exercise: daily running    Diet: meat, veggies, grains      Safety   Seat belts: Yes    Guns: No   Safe in relationships: Yes    Social Determinants of Corporate investment bankerHealth   Financial Resource Strain: Not on file  Food Insecurity: Not on file  Transportation Needs: Not on file  Physical Activity: Not on file  Stress: Not on file  Social Connections: Not on file   Additional Social History:    Allergies:  No Known Allergies  Labs:  Results for orders placed or performed during the hospital encounter of 08/18/22 (from the past 48 hour(s))  Comprehensive metabolic panel     Status: Abnormal   Collection Time: 08/18/22  8:26 PM  Result Value Ref Range   Sodium 138 135 - 145 mmol/L   Potassium 3.6 3.5 - 5.1 mmol/L   Chloride 99 98 - 111 mmol/L   CO2 22 22 - 32 mmol/L   Glucose, Bld 176 (H) 70 - 99 mg/dL    Comment: Glucose reference range applies only to samples taken after fasting for at least 8 hours.   BUN 15 6 - 20 mg/dL   Creatinine, Ser 5.401.05 0.61 - 1.24 mg/dL   Calcium 98.110.1 8.9 - 19.110.3 mg/dL   Total Protein 7.9 6.5 - 8.1 g/dL   Albumin 5.2 (H) 3.5 - 5.0 g/dL   AST 17 15 - 41 U/L   ALT 12 0 - 44 U/L   Alkaline Phosphatase 75 38 - 126 U/L   Total Bilirubin 0.4 0.3 - 1.2 mg/dL   GFR, Estimated >47>60 >82>60 mL/min    Comment: (NOTE) Calculated using the CKD-EPI Creatinine Equation (2021)    Anion gap 17 (H) 5 - 15    Comment: Performed at [facility], [address], [place], [place] [address]  CBC with Differential     Status: Abnormal   Collection Time: 08/18/22  8:26 PM  Result Value Ref Range   WBC 12.0 (H) 4.0 - 10.5 K/uL   RBC 5.33 4.22 - 5.81 MIL/uL   Hemoglobin 16.2 13.0 - 17.0 g/dL   HCT 30.847.3 65.739.0 - 84.652.0 %   MCV 88.7 80.0 - 100.0 fL   MCH 30.4 26.0 - 34.0 pg   MCHC 34.2 30.0 -  36.0 g/dL   RDW 96.212.9 95.211.5 - 84.115.5 %   Platelets 112 (L) 150 - 400 K/uL   nRBC 0.0 0.0 - 0.2 %   Neutrophils Relative % 88 %   Neutro Abs 10.5 (H) 1.7 - 7.7 K/uL   Lymphocytes Relative 7 %   Lymphs Abs 0.9 0.7 - 4.0 K/uL   Monocytes Relative 5 %   Monocytes Absolute 0.5 0.1 - 1.0 K/uL   Eosinophils Relative 0 %   Eosinophils Absolute 0.0 0.0 - 0.5 K/uL   Basophils Relative 0 %   Basophils Absolute 0.0  0.0 - 0.1 K/uL   Immature Granulocytes 0 %   Abs Immature Granulocytes 0.04 0.00 - 0.07 K/uL    Comment: Performed at [facility], [address], [place], [place] [address]  CK     Status: None   Collection Time: 08/18/22  8:26 PM  Result Value Ref Range   Total CK 102 49 - 397 U/L    Comment: Performed at [facility], [address] [place], [place] [address]  Ethanol     Status: None   Collection Time: 08/18/22  8:55 PM  Result Value Ref Range   Alcohol, Ethyl (B) <10 <10 mg/dL    Comment: (NOTE) Lowest detectable limit for serum alcohol is 10 mg/dL.  For medical purposes only. Performed at [facility], [address], [place], [place] [address]   Salicylate level     Status: Abnormal   Collection Time: 08/18/22  8:55 PM  Result Value Ref Range   Salicylate Lvl <7.0 (L) 7.0 - 30.0 mg/dL    Comment: Performed at [facility], [address], [place], [place] [address]  Acetaminophen level     Status: Abnormal   Collection Time: 08/18/22  8:55 PM  Result Value Ref Range   Acetaminophen (Tylenol), Serum <10 (L) 10 - 30 ug/mL    Comment: (NOTE) Therapeutic concentrations vary significantly. A range of 10-30 ug/mL  may be an effective concentration for many patients. However, some  are best treated at concentrations outside of this range. Acetaminophen concentrations >150 ug/mL at 4 hours after ingestion  and >50 ug/mL at 12 hours after ingestion are often associated with  toxic  reactions.  Performed at [facility], [address] [place], [place] [address]   Rapid urine drug screen (hospital performed)     Status: None   Collection Time: 08/18/22 10:22 PM  Result Value Ref Range   Opiates NONE DETECTED NONE DETECTED   Cocaine NONE DETECTED NONE DETECTED   Benzodiazepines NONE DETECTED NONE DETECTED   Amphetamines NONE DETECTED NONE DETECTED   Tetrahydrocannabinol NONE DETECTED NONE DETECTED   Barbiturates NONE DETECTED NONE DETECTED    Comment: (NOTE) DRUG SCREEN FOR MEDICAL PURPOSES ONLY.  IF CONFIRMATION IS NEEDED FOR ANY PURPOSE, NOTIFY LAB WITHIN 5 DAYS.  LOWEST DETECTABLE LIMITS FOR URINE DRUG SCREEN Drug Class                     Cutoff (ng/mL) Amphetamine and metabolites    1000 Barbiturate and metabolites    200 Benzodiazepine                 200 Tricyclics and metabolites     300 Opiates and metabolites        300 Cocaine and metabolites        300 THC                            50 Performed at [facility], [address], [place], [place] [address]   Urinalysis, Routine w reflex microscopic Urine, Clean Catch     Status: Abnormal   Collection Time: 08/18/22 10:22 PM  Result Value Ref Range   Color, Urine YELLOW YELLOW   APPearance CLEAR CLEAR   Specific Gravity, Urine 1.017 1.005 - 1.030   pH 5.5 5.0 - 8.0   Glucose, UA NEGATIVE NEGATIVE mg/dL   Hgb urine dipstick NEGATIVE NEGATIVE   Bilirubin Urine NEGATIVE NEGATIVE  Ketones, ur NEGATIVE NEGATIVE mg/dL   Protein, ur 30 (A) NEGATIVE mg/dL   Nitrite NEGATIVE NEGATIVE   Leukocytes,Ua NEGATIVE NEGATIVE   RBC / HPF 0-5 0 - 5 RBC/hpf   WBC, UA 0-5 0 - 5 WBC/hpf   Squamous Epithelial / LPF 0-5 0 - 5   Mucus PRESENT     Comment: Performed at [facility], [address] [place], [place] [address]  D-dimer, quantitative     Status: None   Collection Time: 08/18/22 11:08 PM  Result Value Ref Range   D-Dimer, Quant 0.35  0.00 - 0.50 ug/mL-FEU    Comment: (NOTE) At the manufacturer cut-off value of 0.5 g/mL FEU, this assay has a negative predictive value of 95-100%.This assay is intended for use in conjunction with a clinical pretest probability (PTP) assessment model to exclude pulmonary embolism (PE) and deep venous thrombosis (DVT) in outpatients suspected of PE or DVT. Results should be correlated with clinical presentation. Performed at [facility], [address] [place], [place] [address]   TSH     Status: Abnormal   Collection Time: 08/18/22 11:08 PM  Result Value Ref Range   TSH 7.935 (H) 0.350 - 4.500 uIU/mL    Comment: Performed by a 3rd Generation assay with a functional sensitivity of <=0.01 uIU/mL. Performed at [facility], [address] [place], [place] [address]   Resp Panel by RT-PCR (Flu A&B, Covid) Anterior Nasal Swab     Status: None   Collection Time: 08/19/22 12:26 AM   Specimen: Anterior Nasal Swab  Result Value Ref Range   SARS Coronavirus 2 by RT PCR NEGATIVE NEGATIVE    Comment: (NOTE) SARS-CoV-2 target nucleic acids are NOT DETECTED.  The SARS-CoV-2 RNA is generally detectable in upper respiratory specimens during the acute phase of infection. The lowest concentration of SARS-CoV-2 viral copies this assay can detect is 138 copies/mL. A negative result does not preclude SARS-Cov-2 infection and should not be used as the sole basis for treatment or other patient management decisions. A negative result may occur with  improper specimen collection/handling, submission of specimen other than nasopharyngeal swab, presence of viral mutation(s) within the areas targeted by this assay, and inadequate number of viral copies(<138 copies/mL). A negative result must be combined with clinical observations, patient history, and epidemiological information. The expected result is Negative.  Fact Sheet for Patients:   BloggerCourse.com  Fact Sheet for Healthcare Providers:  SeriousBroker.it  This test is no t yet approved or cleared by the [place] FDA and  has been authorized for detection and/or diagnosis of SARS-CoV-2 by FDA under an Emergency Use Authorization (EUA). This EUA will remain  in effect (meaning this test can be used) for the duration of the COVID-19 declaration under Section 564(b)(1) of the Act, 21 U.S.C.section 360bbb-3(b)(1), unless the authorization is terminated  or revoked sooner.       Influenza A by PCR NEGATIVE NEGATIVE   Influenza B by PCR NEGATIVE NEGATIVE    Comment: (NOTE) The Xpert Xpress SARS-CoV-2/FLU/RSV plus assay is intended as an aid in the diagnosis of influenza from Nasopharyngeal swab specimens and should not be used as a sole basis for treatment. Nasal washings and aspirates are unacceptable for Xpert Xpress SARS-CoV-2/FLU/RSV testing.  Fact Sheet for Patients: BloggerCourse.com  Fact Sheet for Healthcare Providers: SeriousBroker.it  This test is not yet approved or cleared by the [place] FDA and has been authorized for detection and/or diagnosis of SARS-CoV-2 by FDA under an  Emergency Use Authorization (EUA). This EUA will remain in effect (meaning this test can be used) for the duration of the COVID-19 declaration under Section 564(b)(1) of the Act, 21 U.S.C. section 360bbb-3(b)(1), unless the authorization is terminated or revoked.  Performed at [facility], [address] [place], [place] [address]     Medications:  Current Facility-Administered Medications  Medication Dose Route Frequency Provider Last Rate Last Admin   benztropine (COGENTIN) tablet 1 mg  1 mg Oral BID Pollyann Savoy, MD       OLANZapine zydis (ZYPREXA) disintegrating tablet 10 mg  10 mg Oral Q8H PRN Leevy-Johnson, Vere Diantonio A, NP        And   LORazepam (ATIVAN) tablet 1 mg  1 mg Oral PRN Leevy-Johnson, Kissie Ziolkowski A, NP       And   ziprasidone (GEODON) injection 20 mg  20 mg Intramuscular PRN Leevy-Johnson, Morrissa Shein A, NP       paliperidone (INVEGA) 24 hr tablet 3 mg  3 mg Oral Daily Leevy-Johnson, Nellie Pester A, NP       traZODone (DESYREL) tablet 50 mg  50 mg Oral QHS PRN Pollyann Savoy, MD       ziprasidone (GEODON) injection 20 mg  20 mg Intramuscular Once Blane Ohara, MD       Current Outpatient Medications  Medication Sig Dispense Refill   benztropine (COGENTIN) 1 MG tablet Take 1 tablet (1 mg total) by mouth 2 (two) times daily. 60 tablet 0   paliperidone (INVEGA) 6 MG 24 hr tablet Take 2 tablets (12 mg total) by mouth daily. 60 tablet 0   traZODone (DESYREL) 50 MG tablet Take 1 tablet (50 mg total) by mouth at bedtime as needed for sleep. 30 tablet 0    Musculoskeletal: Strength & Muscle Tone: within normal limits Gait & Station: normal Patient leans: N/A  Psychiatric Specialty Exam:  Presentation  General Appearance: Bizarre  Eye Contact:Fair  Speech:Blocked  Speech Volume:Normal  Handedness:Right   Mood and Affect  Mood:Dysphoric  Affect:Inappropriate; Non-Congruent; Restricted   Thought Process  Thought Processes:Goal Directed  Descriptions of Associations:Loose  Orientation:Partial  Thought Content:Illogical; Scattered  History of Schizophrenia/Schizoaffective disorder:No  Duration of Psychotic Symptoms:Greater than six months  Hallucinations:Hallucinations: None  Ideas of Reference:Paranoia  Suicidal Thoughts:Suicidal Thoughts: Yes, Active  Homicidal Thoughts:Homicidal Thoughts: No   Sensorium  Memory:Immediate Poor  Judgment:Impaired  Insight:Poor   Executive Functions  Concentration:Poor  Attention Span:Poor  Recall:Poor  Fund of Knowledge:Fair  Language:Fair   Psychomotor Activity  Psychomotor Activity:Psychomotor Activity: Normal  Assets   Assets:Resilience; Physical Health   Sleep  Sleep:Sleep: Poor   Physical Exam: Physical Exam Vitals and nursing note reviewed.  Constitutional:      Appearance: He is not ill-appearing.  HENT:     Head: Normocephalic.     Nose: Nose normal.     Mouth/Throat:     Mouth: Mucous membranes are moist.     Pharynx: Oropharynx is clear.  Eyes:     Pupils: Pupils are equal, round, and reactive to light.  Cardiovascular:     Rate and Rhythm: Normal rate.     Pulses: Normal pulses.  Pulmonary:     Effort: Pulmonary effort is normal.  Abdominal:     Palpations: Abdomen is soft.  Musculoskeletal:        General: Normal range of motion.     Cervical back: Normal range of motion.  Skin:    General: Skin is dry.  Neurological:  Mental Status: He is oriented to person, place, and time.  Psychiatric:        Attention and Perception: He is inattentive.        Mood and Affect: Affect is inappropriate.        Speech: Speech is delayed.        Behavior: Behavior is withdrawn.        Thought Content: Thought content is delusional. Thought content includes suicidal ideation. Thought content includes suicidal plan.        Cognition and Memory: Cognition is impaired.        Judgment: Judgment is impulsive and inappropriate.    Review of Systems  Psychiatric/Behavioral:  Positive for depression and suicidal ideas.   All other systems reviewed and are negative.  Blood pressure 135/77, pulse 91, temperature 98.2 F (36.8 C), resp. rate 19, height 5\' 11"  (1.803 m), weight 95.5 kg, SpO2 99 %. Body mass index is 29.36 kg/m.  Treatment Plan Summary: Daily contact with patient to assess and evaluate symptoms and progress in treatment, Medication management, and Plan restart PO home medications, seek inpatient psychiatric hospitalization. Plan to transfer patient to [facility] until inpatient placement located.   Disposition: Recommend psychiatric Inpatient admission when medically  cleared. Supportive therapy provided about ongoing stressors. Discussed crisis plan, support from social network, calling 911, coming to the Emergency Department, and calling Suicide Hotline.  This service was provided via telemedicine using a 2-way, interactive audio and video technology.  Names of all persons participating in this telemedicine service and their role in this encounter. Name: [facility] Role: PMHNP  Name: Maxie Barb Role: Attending MD  Name: Phineas Inches Role: patient  Name:  Role:     Romilda Joy, NP 08/19/2022 11:35 AM

## 2022-08-19 NOTE — ED Notes (Signed)
Pt given Kind bar and oatmeal, per Verlon Au - RN.

## 2022-08-19 NOTE — ED Notes (Addendum)
Called Sheriff's voicemail [phone] left another voicemail for patients transportation

## 2022-08-19 NOTE — ED Notes (Signed)
Pt attempting to get out of bed, easily redirected by NT, mother at bedside

## 2022-08-19 NOTE — ED Notes (Signed)
After TTS, pt got out of bed. Pt is unsteady on feet. Pt asked to sit in chair or on bed. Pt refusing. Security called. Security at bedside. Pt tried to close door. Pt informed not that the door must be left open. Pt agitated. Verlon Au - RN and Pearletha Alfred aware.

## 2022-08-19 NOTE — ED Notes (Signed)
Called [facility] left message at [phone] @ 3:35 pm

## 2022-08-19 NOTE — ED Provider Notes (Addendum)
Patient here under IVC, seen by TTS who recommend inpatient admission. No beds at [facility]. He has been intermittent agitated and aggressive with staff. Given ativan, haldol and Geodon with some improvement. His HR has improved considerably. Our Consulting civil engineer spoke with Consulting civil engineer at [facility] who reports they do not have adequate nursing staff to take the patient there until 1100hrs today. Homes meds ordered.      Pollyann Savoy, MD 08/19/22 [phone]

## 2022-08-19 NOTE — ED Notes (Signed)
TTS at bedside. 

## 2022-08-19 NOTE — ED Notes (Signed)
Pt. Changed into new scrubs again at this time. Bedding changed, pt resting comfortably.

## 2022-08-19 NOTE — ED Notes (Signed)
Pt in room pacing. Dr. Jodi Mourning informed.

## 2022-08-19 NOTE — ED Notes (Signed)
Pt's father brought back to room to help calm pt. Pt compliant.

## 2022-08-19 NOTE — ED Notes (Signed)
Pt keeps trying to frequently get up out of bed. Sinus 96 on monitor. Pt attempted to swing his arm and hit the EMT when she was trying to place pulse ox back on his finger and assist pt back to bed. Reassurance given to patient. Will monitor.

## 2022-08-19 NOTE — Progress Notes (Signed)
Per [facility] , pt has been accepted to [facility] [place] [facility]. Accepting provider is Dr. Otho Perl. Patient can arrive 08/20/2022 after 9:00am. Number for report is [phone].   Crissie Reese, MSW, LCSW-A Phone: [phone] Disposition/TOC

## 2022-08-19 NOTE — ED Notes (Signed)
Called pharmacy at [facility], Darl Pikes will dispense meds if pt does not get transferred to another facility

## 2022-08-19 NOTE — ED Notes (Signed)
Pt speaking to counselor (TTS)

## 2022-08-19 NOTE — ED Notes (Signed)
Randy - Security currently "sitting" with pt.

## 2022-08-19 NOTE — ED Notes (Signed)
Pt up to nursing desk to call his parents

## 2022-08-19 NOTE — ED Notes (Signed)
Pt has arrived with [facility] as transfer from [facility]. Alert and ambulatory. IVC paperwork with pt.

## 2022-08-19 NOTE — ED Notes (Signed)
Pt up to nurses station for another phone call to mother

## 2022-08-20 NOTE — ED Provider Notes (Signed)
Emergency Medicine Observation Re-evaluation Note  Mario Thomas is a 21 y.o. male, seen on rounds today.  Pt initially presented to the ED for complaints of Altered Mental Status On my evaluation, the patient is sleeping comfortably.  Physical Exam  BP (!) 140/83 (BP Location: Right Arm)   Pulse (!) 115   Temp 98.1 F (36.7 C) (Oral)   Resp 16   Ht 5\' 11"  (1.803 m)   Wt 95.5 kg   SpO2 100%   BMI 29.36 kg/m  Physical Exam General: Sleeping comfortably Cardiac: Well-perfused Lungs: No increased work of breathing Psych: Calm, resting  ED Course / MDM  EKG:EKG Interpretation  Date/Time:  Saturday August 18 2022 20:27:15 EDT Ventricular Rate:  147 PR Interval:  131 QRS Duration: 78 QT Interval:  253 QTC Calculation: 396 R Axis:   64 Text Interpretation: Sinus tachycardia Probable left atrial enlargement Nonspecific T abnormalities, lateral leads Baseline wander in lead(s) V2 Confirmed by 07-07-1986 (Alvino Blood) on 08/18/2022 11:17:07 PM  I have reviewed the labs performed to date as well as medications administered while in observation.  Recent changes in the last 24 hours include that the patient was accepted at [facility] today.  Plan  Current plan is for placement at [facility].    [place], MD 08/20/22 [phone]

## 2022-08-20 NOTE — ED Notes (Signed)
Report called to Wynn Maudlin RN at [facility]

## 2022-08-20 NOTE — Progress Notes (Addendum)
Correction CSW received a phone call back from [place] at [facility] pt was accepted 08/19/22 Per Sunny Schlein, [facility] admissions , pt has been accepted to [facility] [place] [facility]. Accepting provider is Dr. Otho Perl. Patient can arrive 08/20/2022 after 9:00am. Number for report is [phone].  --CSW requested that nursing coordinate transport ASAP.  Care Team notified: Susa Griffins, RN, Alan Mulder, FNP,and Tia Alert, RN. * This CSW contacted [facility], per Tad Moore pt was not accepted to [facility] yesterday. Not on 08/19/22 by CSW Crissie Reese, LCSWA Per Sunny Schlein, [facility] admissions , pt has been accepted to [facility] [place] [place] [facility] unit. Accepting provider is Dr. Otho Perl. Patient can arrive 08/20/2022 after 9:00am. Number for report is [phone]. Was written in error.  Pt has been faxed to out of network providers. There are no available beds at [facility] per [facility], RN.   Destination Service Provider Address Phone Fax  [facility]  [address], [place] [address] [phone]  [facility] [address] [place]  [address], [place] [address] [phone]  [facility]  [address] [place] [address] [phone]  [facility]  [address] [place] [address] [phone]  [facility]  [address] [place], [place] [address] [phone]  [facility]  [address] [place] [address] [phone]  [facility]  [address] [place] [address] [phone]  [facility]  [address], [place] [address] [phone]  [facility]  [address], [place] [address] [phone]  [facility]  [address],  [place] [address] [phone]  [facility]  [address] [place] [address] [phone]  [facility]  [address] [place] [address] [phone]  [facility]  [address], [place] [address] [phone]  [facility]  [address] [place], [place] [address] [phone]    Currently pt is under review at [facility]. CSW will assist and follow with placement.   Maryjean Ka, MSW, LCSWA 08/20/2022 12:21 PM

## 2022-09-09 ENCOUNTER — Encounter (HOSPITAL_COMMUNITY): Payer: Self-pay

## 2022-09-09 ENCOUNTER — Other Ambulatory Visit: Payer: Self-pay

## 2022-09-09 ENCOUNTER — Emergency Department (HOSPITAL_COMMUNITY)
Admission: EM | Admit: 2022-09-09 | Discharge: 2022-09-09 | Disposition: A | Discharge status: Home / Self Care | Payer: 59 | Attending: Emergency Medicine | Admitting: Emergency Medicine

## 2022-09-09 DIAGNOSIS — R45851 Suicidal ideations: Secondary | ICD-10-CM | POA: Diagnosis not present

## 2022-09-09 DIAGNOSIS — Z20822 Contact with and (suspected) exposure to covid-19: Secondary | ICD-10-CM | POA: Insufficient documentation

## 2022-09-09 DIAGNOSIS — F312 Bipolar disorder, current episode manic severe with psychotic features: Secondary | ICD-10-CM | POA: Insufficient documentation

## 2022-09-09 DIAGNOSIS — R443 Hallucinations, unspecified: Secondary | ICD-10-CM

## 2022-09-09 DIAGNOSIS — F259 Schizoaffective disorder, unspecified: Secondary | ICD-10-CM | POA: Diagnosis present

## 2022-09-09 DIAGNOSIS — R440 Auditory hallucinations: Secondary | ICD-10-CM | POA: Diagnosis present

## 2022-09-09 DIAGNOSIS — F250 Schizoaffective disorder, bipolar type: Secondary | ICD-10-CM

## 2022-09-09 LAB — RESP PANEL BY RT-PCR (FLU A&B, COVID) ARPGX2
Influenza A by PCR: NEGATIVE
Influenza B by PCR: NEGATIVE
SARS Coronavirus 2 by RT PCR: NEGATIVE

## 2022-09-09 LAB — RAPID URINE DRUG SCREEN, HOSP PERFORMED
Amphetamines: NOT DETECTED
Barbiturates: NOT DETECTED
Benzodiazepines: NOT DETECTED
Cocaine: NOT DETECTED
Opiates: NOT DETECTED
Tetrahydrocannabinol: NOT DETECTED

## 2022-09-09 LAB — COMPREHENSIVE METABOLIC PANEL
ALT: 15 U/L (ref 0–44)
AST: 17 U/L (ref 15–41)
Albumin: 4.5 g/dL (ref 3.5–5.0)
Alkaline Phosphatase: 77 U/L (ref 38–126)
Anion gap: 7 (ref 5–15)
BUN: 10 mg/dL (ref 6–20)
CO2: 27 mmol/L (ref 22–32)
Calcium: 9.4 mg/dL (ref 8.9–10.3)
Chloride: 103 mmol/L (ref 98–111)
Creatinine, Ser: 0.77 mg/dL (ref 0.61–1.24)
GFR, Estimated: >60 mL/min (ref >60)
Glucose, Bld: 102 mg/dL — ABNORMAL HIGH (ref 70–99)
Potassium: 4 mmol/L (ref 3.5–5.1)
Sodium: 137 mmol/L (ref 135–145)
Total Bilirubin: 0.5 mg/dL (ref 0.3–1.2)
Total Protein: 7.6 g/dL (ref 6.5–8.1)

## 2022-09-09 LAB — ETHANOL: Alcohol, Ethyl (B): <10 mg/dL (ref <10)

## 2022-09-09 LAB — CBC
HCT: 50.3 % (ref 39.0–52.0)
Hemoglobin: 16.3 g/dL (ref 13.0–17.0)
MCH: 29.3 pg (ref 26.0–34.0)
MCHC: 32.4 g/dL (ref 30.0–36.0)
MCV: 90.5 fL (ref 80.0–100.0)
Platelets: 177 10*3/uL (ref 150–400)
RBC: 5.56 MIL/uL (ref 4.22–5.81)
RDW: 12.4 % (ref 11.5–15.5)
WBC: 6.8 10*3/uL (ref 4.0–10.5)
nRBC: 0 % (ref 0.0–0.2)

## 2022-09-09 LAB — SALICYLATE LEVEL: Salicylate Lvl: <7 mg/dL — ABNORMAL LOW (ref 7.0–30.0)

## 2022-09-09 LAB — ACETAMINOPHEN LEVEL: Acetaminophen (Tylenol), Serum: <10 ug/mL — ABNORMAL LOW (ref 10–30)

## 2022-09-09 MED ORDER — PALIPERIDONE ER 3 MG PO TB24
9.0000 mg | ORAL_TABLET | Freq: Every day | ORAL | Status: DC
Start: 2022-09-10 — End: 2022-09-09

## 2022-09-09 MED ORDER — TRAZODONE HCL 100 MG PO TABS: 50.0000 mg | ORAL_TABLET | Freq: Every evening | ORAL | Status: DC | PRN

## 2022-09-09 MED ORDER — BENZTROPINE MESYLATE 0.5 MG PO TABS
1.0000 mg | ORAL_TABLET | Freq: Two times a day (BID) | ORAL | Status: DC
  Administered 2022-09-09: 1 mg via ORAL
  Filled 2022-09-09: qty 2

## 2022-09-09 MED ORDER — PALIPERIDONE ER 9 MG PO TB24: 9.0000 mg | ORAL_TABLET | Freq: Every day | ORAL | 0 refills | Status: DC

## 2022-09-09 MED ORDER — ARIPIPRAZOLE 10 MG PO TABS: 10.0000 mg | ORAL_TABLET | Freq: Every day | ORAL | Status: DC

## 2022-09-09 MED ORDER — PALIPERIDONE ER 6 MG PO TB24
12.0000 mg | ORAL_TABLET | Freq: Every day | ORAL | Status: DC
  Administered 2022-09-09: 12 mg via ORAL
  Filled 2022-09-09: qty 2

## 2022-09-09 NOTE — ED Triage Notes (Signed)
Patient states he is seeing people from his past and they are talking to him. Patient states he stopped taking his medication due to the side effects. Unsure of the name of the medications he should be taking.
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
## 2022-09-09 NOTE — Progress Notes (Signed)
Transition of Care [facility]) - Emergency Department Mini Assessment   Patient Details  Name: Mario Thomas MRN: [id number] Date of Birth: 12-30-2000  [facility] CM/SW Contact:    Kimber Relic, LCSW Phone Number: 09/09/2022, 12:20 PM   Clinical Narrative: Pt reported to ED stating he was seeing people from his past and they were talking to him. Pt has been released from Psych care per Davis, NP. Pt provided with [facility] information attached to AVS by Jamaral, LCSW-A and also added in follow up providers by this writer.  Per Josephine's note, outpatient referral to [facility] open access was given. TOC signing off.    ED Mini Assessment: What brought you to the Emergency Department? : [facility]  Barriers to Discharge: No Barriers Identified     Means of departure: Not know       Patient Contact and Communications        ,                 Admission diagnosis:  hearing voices and seeing things Patient Active Problem List   Diagnosis Date Noted   Schizoaffective disorder ([place]) 08/19/2022   GAD (generalized anxiety disorder) 06/12/2022   Insomnia 06/12/2022   Alcohol use 06/12/2022   Bipolar disorder with psychotic features ([place]) 11/09/2021   Involuntary commitment 11/06/2021   Allergic rhinitis 05/09/2012   Functional heart murmur 04/04/2012   PCP:  Pcp, No Pharmacy:   [id number] [place], [place] [address] Phone: [phone] Fax: [phone]
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
## 2022-09-09 NOTE — Progress Notes (Signed)
CSW provided the following resource for the patient:   [facility] provide timely access to mental health services for children and adolescents (4-17) and adults presenting in a mental health crisis. The program is designed for those who need urgent Behavioral Health or Substance Use treatment and are not experiencing a medical crisis that would typically require an emergency room visit.    [place], [place] [address] Phone: [phone] [email or web address]   [facility] will also offer the following outpatient services: (Monday through Friday 8am-5pm)   Partial Hospitalization Program (PHP) Substance Abuse Intensive Outpatient Program (SA-IOP) Group Therapy Medication Management Peer Living Room   We also provide (24/7):    Assessments: Our mental health clinician and providers will conduct a focused mental health evaluation, assessing for immediate safety concerns and further mental health needs.   Referral: Our team will provide resources and help connect to community based mental health treatment, when indicated, including psychotherapy, psychiatry, and other specialized behavioral health or substance use disorder services (for those not already in treatment).   Transitional Care: Our team providers in person bridging and/or telphonic follow-up during the patient's transition to outpatient services.      Glennie Isle, MSW, [place] Phone: [phone] Disposition/TOC

## 2022-09-09 NOTE — Progress Notes (Signed)
Per Leandro Reasoner, NP, patient meets criteria for inpatient treatment. There are no available beds at [facility] today. CSW faxed referrals to the following facilities for review:  [address] [place] [address] [phone] --  [place] N/A [address], [place] [address] [phone] --  [address] [place] [address] [phone] --  [address] [place] [address] [phone] --  [place]  Pending - Request Sent N/A [place], [place] [address] [phone] --  [place] [address] [place], [place] [address] [phone] --  [place] [address] [place] [address] [phone] --  [facility]  Pending - Request Sent N/A [address] [place] [address] [place] [address] [place] [address] [phone] --  [facility]  Pending - Request Sent N/A [address] [place] [address] [phone] --  [facility]  Pending - Request Sent N/A [address] [place] [address] [phone] --  [facility]  Pending - Request Sent N/A [place], [place] [address] [phone] --  [facility]  Pending - Request Sent N/A [address] [place] [address] [phone] --  [place] N/A [address], [place] [address] [phone] --   TTS will continue to seek bed placement.  Glennie Isle, MSW, [place] Phone: [phone] Disposition/TOC

## 2022-09-09 NOTE — Discharge Instructions (Signed)
[facility]-will provide timely access to mental health services for children and adolescents (4-17) and adults presenting in a mental health crisis. The program is designed for those who need urgent Behavioral Health or Substance Use treatment and are not experiencing a medical crisis that would typically require an emergency room visit.    [address] [place], [place] [address] Phone: [phone] [email or web address]   [facility] will also offer the following outpatient services: (Monday through Friday 8am-5pm)    Partial Hospitalization Program (PHP)  Substance Abuse Intensive Outpatient Program (SA-IOP)  Group Therapy  Medication Management  Peer Living Room   We also provide (24/7):    Assessments: Our mental health clinician and providers will conduct a focused mental health evaluation, assessing for immediate safety concerns and further mental health needs.   Referral: Our team will provide resources and help connect to community based mental health treatment, when indicated, including psychotherapy, psychiatry, and other specialized behavioral health or substance use disorder services (for those not already in treatment).   Transitional Care: Our team providers in person bridging and/or telphonic follow-up during the patient's transition to outpatient services.      

## 2022-09-09 NOTE — ED Provider Notes (Signed)
[facility] Emergency Department Provider Note MRN:  [id number]  Arrival date & time: 09/09/22     Chief Complaint   Hallucinations   History of Present Illness   Mario Thomas is a 21 y.o. year-old male presents to the ED with chief complaint of auditory hallucinations.  He states that he is having conversations with people that have died.  He states that occasionally he feels suicidal, but he is able to push the thoughts out of his mind.  He is requesting assistance for these persistent symptoms.  He states that generally the symptoms continue to worsen until he has to come into the hospital.  He denies any recent illnesses.  Denies drug use.  History provided by patient.   Review of Systems  Pertinent positive and negative review of systems noted in HPI.    Physical Exam   Vitals:   09/09/22 0204 09/09/22 0343  BP:  131/75  Pulse:  68  Resp:  18  Temp:  98.7 F (37.1 C)  SpO2: 98% 100%    CONSTITUTIONAL:  well-appearing, NAD NEURO:  Alert and oriented x 3, CN 3-12 grossly intact EYES:  eyes equal and reactive ENT/NECK:  Supple, no stridor  CARDIO:  appears well-perfused  PULM:  No respiratory distress, no wheezing GI/GU:  non-distended,  MSK/SPINE:  No gross deformities, no edema, moves all extremities  SKIN:  no rash, atraumatic   *Additional and/or pertinent findings included in MDM below  Diagnostic and Interventional Summary    EKG Interpretation  Date/Time:    Ventricular Rate:    PR Interval:    QRS Duration:   QT Interval:    QTC Calculation:   R Axis:     Text Interpretation:         Labs Reviewed  COMPREHENSIVE METABOLIC PANEL - Abnormal; Notable for the following components:      Result Value   Glucose, Bld 102 (*)    All other components within normal limits  SALICYLATE LEVEL - Abnormal; Notable for the following components:   Salicylate Lvl <1.4 (*)    All other components within normal limits  ACETAMINOPHEN LEVEL  - Abnormal; Notable for the following components:   Acetaminophen (Tylenol), Serum <10 (*)    All other components within normal limits  RESP PANEL BY RT-PCR (FLU A&B, COVID) ARPGX2  ETHANOL  CBC  RAPID URINE DRUG SCREEN, HOSP PERFORMED    No orders to display    Medications  ARIPiprazole (ABILIFY) tablet 10 mg (has no administration in time range)  benztropine (COGENTIN) tablet 1 mg (has no administration in time range)  paliperidone (INVEGA) 24 hr tablet 12 mg (has no administration in time range)  traZODone (DESYREL) tablet 50 mg (has no administration in time range)     Procedures  /  Critical Care Procedures  ED Course and Medical Decision Making  I have reviewed the triage vital signs, the nursing notes, and pertinent available records from the EMR.  Social Determinants Affecting Complexity of Care: Patient has no clinically significant social determinants affecting this chief complaint..   ED Course:    Medical Decision Making Amount and/or Complexity of Data Reviewed Labs: ordered.     Consultants: TTS consult pending.   Treatment and Plan: Dispo per TTS.    Final Clinical Impressions(s) / ED Diagnoses     ICD-10-CM   1. Hallucinations  R44.3       ED Discharge Orders     None  Discharge Instructions Discussed with and Provided to Patient:   Discharge Instructions   None      Roxy Horseman, PA-C 09/09/22 0439    Nira Conn, MD 09/09/22 [phone]

## 2022-09-09 NOTE — ED Notes (Signed)
Patient to the bathroom for urine sample.

## 2022-09-24 ENCOUNTER — Encounter (HOSPITAL_COMMUNITY): Payer: Self-pay | Admitting: Psychiatry

## 2022-09-24 ENCOUNTER — Telehealth: Payer: Self-pay

## 2022-09-24 ENCOUNTER — Other Ambulatory Visit: Payer: Self-pay

## 2022-09-24 ENCOUNTER — Inpatient Hospital Stay (HOSPITAL_COMMUNITY)
Admission: RE | Admit: 2022-09-24 | Discharge: 2022-10-05 | DRG: 885 | Disposition: A | Discharge status: Home / Self Care | Payer: 59 | Attending: Psychiatry | Admitting: Psychiatry

## 2022-09-24 DIAGNOSIS — F200 Paranoid schizophrenia: Secondary | ICD-10-CM | POA: Diagnosis present

## 2022-09-24 DIAGNOSIS — Z818 Family history of other mental and behavioral disorders: Secondary | ICD-10-CM | POA: Diagnosis not present

## 2022-09-24 DIAGNOSIS — F17210 Nicotine dependence, cigarettes, uncomplicated: Secondary | ICD-10-CM | POA: Diagnosis present

## 2022-09-24 DIAGNOSIS — Z23 Encounter for immunization: Secondary | ICD-10-CM | POA: Diagnosis not present

## 2022-09-24 DIAGNOSIS — R45851 Suicidal ideations: Secondary | ICD-10-CM | POA: Diagnosis present

## 2022-09-24 DIAGNOSIS — Z91148 Patient's other noncompliance with medication regimen for other reason: Secondary | ICD-10-CM | POA: Diagnosis not present

## 2022-09-24 DIAGNOSIS — Z1152 Encounter for screening for COVID-19: Secondary | ICD-10-CM

## 2022-09-24 DIAGNOSIS — R45850 Homicidal ideations: Secondary | ICD-10-CM | POA: Diagnosis present

## 2022-09-24 DIAGNOSIS — F250 Schizoaffective disorder, bipolar type: Principal | ICD-10-CM | POA: Diagnosis present

## 2022-09-24 DIAGNOSIS — F411 Generalized anxiety disorder: Secondary | ICD-10-CM | POA: Diagnosis present

## 2022-09-24 DIAGNOSIS — G4700 Insomnia, unspecified: Secondary | ICD-10-CM | POA: Diagnosis present

## 2022-09-24 DIAGNOSIS — Z79899 Other long term (current) drug therapy: Secondary | ICD-10-CM

## 2022-09-24 LAB — SARS CORONAVIRUS 2 BY RT PCR: SARS Coronavirus 2 by RT PCR: NEGATIVE

## 2022-09-24 MED ORDER — ACETAMINOPHEN 325 MG PO TABS
650.0000 mg | ORAL_TABLET | Freq: Four times a day (QID) | ORAL | Status: DC | PRN
  Administered 2022-09-27: 650 mg via ORAL
  Filled 2022-09-24: qty 2

## 2022-09-24 MED ORDER — MAGNESIUM HYDROXIDE 400 MG/5ML PO SUSP: 30.0000 mL | Freq: Every day | ORAL | Status: DC | PRN

## 2022-09-24 MED ORDER — HYDROXYZINE HCL 25 MG PO TABS
25.0000 mg | ORAL_TABLET | Freq: Three times a day (TID) | ORAL | Status: DC | PRN
  Administered 2022-09-25 – 2022-10-04 (×10): 25 mg via ORAL
  Filled 2022-09-24 (×11): qty 1

## 2022-09-24 MED ORDER — TRAZODONE HCL 50 MG PO TABS
50.0000 mg | ORAL_TABLET | Freq: Every evening | ORAL | Status: DC | PRN
  Administered 2022-09-25 – 2022-09-28 (×4): 50 mg via ORAL
  Filled 2022-09-24 (×5): qty 1

## 2022-09-24 MED ORDER — ALUM & MAG HYDROXIDE-SIMETH 200-200-20 MG/5ML PO SUSP: 30.0000 mL | ORAL | Status: DC | PRN

## 2022-09-24 NOTE — Progress Notes (Signed)
Admitted this 21 y/o male who is a voluntary admission and walk -in here at [facility]. He tells me he is here because he wanted to kill his dad and had thoughts to kill himself. Says he does not plan on staying long and will probably leave soon. Disorganized and suspicious. Denies hallucinations but appeared may have been responding to internal stimuli at one point. Cooperative. Patient denies hx of substance abuse to me but he remains suspicious and is guarded. He denies current S.I. and H.I. and contracts for safety.Anxious initially,later in admission process appears sleepy and ready for bed. Had sandwich and went to sleep. Support and monitor.

## 2022-09-24 NOTE — [facility] Group Notes (Signed)
Adult Psychoeducational Group Note  Date:  09/24/2022 Time:  8:15 PM  Group Topic/Focus:  Wrap-Up Group:   The focus of this group is to help patients review their daily goal of treatment and discuss progress on daily workbooks.  Participation Level:  Minimal  Participation Quality:  Appropriate  Affect:  Appropriate  Cognitive:  Appropriate  Insight: Appropriate  Engagement in Group:  Engaged  Modes of Intervention:  Discussion  Additional Comments:  Patient attended and participated in the [place] group.  Annie Sable 09/24/2022, 8:15 PM

## 2022-09-24 NOTE — Telephone Encounter (Signed)
See note below the access note; I spoke with pts father (DPR not signed) and pts father said that pt is asleep right now and he does not want to wake pt;pts father said yesterday pt forgot to take his med and had CP,difficulty breathing, some hallucinations. Pt said still some symptoms this morning but after pt took med he was OK and is now asleep. No SI/HI now. Pt last saw Dr Einar Pheasant for annual exam 01/23/21; pt s father said pt last saw psychiatrist in [place] about 8 months ago. Pt wants new psychiatrist per pts father. Pt was seen 09/09/22 in ED for hallucinations. Pt has TOC with Romilda Garret on 11/12/22. Pts father wants to know if can be seen for [facility] before December; I spoke with Romilda Garret NP and he said possibly could see pt sooner than Dec but pt needs to be seen and eval for his CP,SOB and hallucinations now. Pt should be eval at ED or at least UC per Romilda Garret NP. Pts father notified and voiced understanding. He said he can get pt to ED or UC later today and then will cb to reschedule sooner TOC. Sending note to Romilda Garret NP.        [facility] - Client TELEPHONE ADVICE RECORD AccessNurse Patient Name: Mario Thomas Gender: Male DOB: 2000/12/25 Age: 21 Y 19 D Return Phone Number: [phone] (Primary), [phone] (Secondary) Address: City/ State/ Zip: [place] [address] Client [facility] - Client Client Site [place] - Day Provider Waunita Schooner- MD Contact Type Call Who Is Calling Patient / Member / Family / Caregiver Call Type Triage / Clinical Relationship To Patient Self Return Phone Number [phone] (Primary) Chief Complaint CHEST PAIN - pain, pressure, heaviness or tightness Reason for Call Symptomatic / Request for [place] states he is having chest pain, trouble breathing, anxiety, hallucinations. Translation No Nurse Assessment Nurse: Tito Dine, RN, Neoma Laming Date/Time Eilene Ghazi Time): 09/24/2022 12:48:42 PM Confirm and document reason for call. If symptomatic, describe symptoms. ---Caller states he is having chest pain, trouble breathing, anxiety, hallucinations. Does the patient have any new or worsening symptoms? ---Yes Will a triage be completed? ---Yes Related visit to physician within the last 2 weeks? ---No Does the PT have any chronic conditions? (i.e. diabetes, asthma, this includes High risk factors for pregnancy, etc.) ---Yes List chronic conditions. ---Depression Hallucinations Is this a behavioral health or substance abuse call? ---Yes Are you having any thoughts or feelings of harming or killing yourself or someone else? ---Yes Do you have a weapon with you? ---No Are you alone? ---No Enter any comments. ---Caller states his dad is with him Are you currently experiencing any physical discomfort that you think may be related to the use of alcohol or other drugs? (use substance abuse or alcohol abuse guidelines. These include withdrawal symptoms) ---Yes Do you worry that you may be hearing or seeing things that others do not? ---Yes PLEASE NOTE: All timestamps contained within this report are represented as [place] Standard Time. CONFIDENTIALTY NOTICE: This fax transmission is intended only for the addressee. It contains information that is legally privileged, confidential or otherwise protected from use or disclosure. If you are not the intended recipient, you are strictly prohibited from reviewing, disclosing, copying using or disseminating any of this information or taking any action in reliance on or regarding this information. If you have received this fax in error, please notify us  immediately by telephone so that we can arrange for its return to [place]. Phone: [phone], Toll-Free: [phone], Fax: [phone] Page: 2 of 2 Call Id: [id number] Nurse Assessment Do you take medications for your  condition(s)? ---Yes List medications here. ---Paliperidone other meds as need it. Guidelines Guideline Title Affirmed Question Affirmed Notes Nurse Date/Time Eilene Ghazi Time) Schizophrenia Patient attempted suicide Tito Dine, RNNeoma Laming 09/24/2022 12:55:25 PM Disp. Time Eilene Ghazi Time) Disposition Final User 09/24/2022 12:45:17 PM Send to Urgent Minda Meo 09/24/2022 1:02:42 PM Call EMS 911 Now Yes Tito Dine, RN, Deborah Final Disposition 09/24/2022 1:02:42 PM Call EMS 911 Now Yes Tito Dine, RN, Garrel Ridgel Disagree/Comply Disagree Caller Understands No PreDisposition Call Doctor Care Advice Given Per Guideline CALL EMS 911 NOW: * Immediate medical attention is needed. You need to hang up and call 911 (or an ambulance). * Triager Discretion: I'll call you back in a few minutes to be sure you were able to reach them. NOTE TO TRIAGER - SUICIDE THREAT OR ATTEMPT: * All patients with a suicide attempt or threat should be seen immediately for an evaluation even if they have no medical symptoms. NOTE TO TRIAGER - OBTAIN PATIENT DATA: * WHO: Patient's name * WHERE: Patient's address or location. * PHONE: Phone number; capture from Caller ID if needed. CARE ADVICE given per Schizophrenia (Adult) guideline. Comments User: Nelwyn Salisbury, RN Date/Time Eilene Ghazi Time): 09/24/2022 1:05:35 PM Called [facility] number and provided pt information. Spoke to the office and they will call patient back no

## 2022-09-24 NOTE — Telephone Encounter (Signed)
Received call from access nurse that they had triaged patient. Advised go to ED. Patient refused. States would like appointment with office. I have called both numbers provided to connect with patient not able to reach. Sending to our triage pool high priority and sending teams as well to let them know.   [phone]
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
## 2022-09-25 DIAGNOSIS — F250 Schizoaffective disorder, bipolar type: Secondary | ICD-10-CM | POA: Diagnosis not present

## 2022-09-25 DIAGNOSIS — F200 Paranoid schizophrenia: Secondary | ICD-10-CM | POA: Diagnosis present

## 2022-09-25 LAB — CBC
HCT: 46 % (ref 39.0–52.0)
Hemoglobin: 15 g/dL (ref 13.0–17.0)
MCH: 29.8 pg (ref 26.0–34.0)
MCHC: 32.6 g/dL (ref 30.0–36.0)
MCV: 91.3 fL (ref 80.0–100.0)
Platelets: 146 10*3/uL — ABNORMAL LOW (ref 150–400)
RBC: 5.04 MIL/uL (ref 4.22–5.81)
RDW: 12.6 % (ref 11.5–15.5)
WBC: 4.4 10*3/uL (ref 4.0–10.5)
nRBC: 0 % (ref 0.0–0.2)

## 2022-09-25 LAB — HEMOGLOBIN A1C
Hgb A1c MFr Bld: 4.8 % (ref 4.8–5.6)
Mean Plasma Glucose: 91.06 mg/dL

## 2022-09-25 LAB — URINALYSIS, COMPLETE (UACMP) WITH MICROSCOPIC
Bilirubin Urine: NEGATIVE
Glucose, UA: NEGATIVE mg/dL
Hgb urine dipstick: NEGATIVE
Ketones, ur: NEGATIVE mg/dL
Leukocytes,Ua: NEGATIVE
Nitrite: NEGATIVE
Protein, ur: NEGATIVE mg/dL
Specific Gravity, Urine: 1.028 (ref 1.005–1.030)
pH: 5 (ref 5.0–8.0)

## 2022-09-25 LAB — COMPREHENSIVE METABOLIC PANEL
ALT: 16 U/L (ref 0–44)
AST: 17 U/L (ref 15–41)
Albumin: 4.2 g/dL (ref 3.5–5.0)
Alkaline Phosphatase: 67 U/L (ref 38–126)
Anion gap: 7 (ref 5–15)
BUN: 14 mg/dL (ref 6–20)
CO2: 28 mmol/L (ref 22–32)
Calcium: 9 mg/dL (ref 8.9–10.3)
Chloride: 102 mmol/L (ref 98–111)
Creatinine, Ser: 0.75 mg/dL (ref 0.61–1.24)
GFR, Estimated: >60 mL/min (ref >60)
Glucose, Bld: 104 mg/dL — ABNORMAL HIGH (ref 70–99)
Potassium: 3.8 mmol/L (ref 3.5–5.1)
Sodium: 137 mmol/L (ref 135–145)
Total Bilirubin: 0.9 mg/dL (ref 0.3–1.2)
Total Protein: 7.3 g/dL (ref 6.5–8.1)

## 2022-09-25 LAB — LIPID PANEL
Cholesterol: 122 mg/dL (ref 0–200)
HDL: 42 mg/dL (ref >40)
LDL Cholesterol: 75 mg/dL (ref 0–99)
Total CHOL/HDL Ratio: 2.9 RATIO
Triglycerides: 27 mg/dL (ref <150)
VLDL: 5 mg/dL (ref 0–40)

## 2022-09-25 LAB — TSH: TSH: 3.122 u[IU]/mL (ref 0.350–4.500)

## 2022-09-25 MED ORDER — OLANZAPINE 5 MG PO TBDP
5.0000 mg | ORAL_TABLET | Freq: Three times a day (TID) | ORAL | Status: DC | PRN
  Administered 2022-09-25 – 2022-09-28 (×4): 5 mg via ORAL
  Filled 2022-09-25 (×4): qty 1

## 2022-09-25 MED ORDER — PALIPERIDONE ER 6 MG PO TB24
12.0000 mg | ORAL_TABLET | Freq: Every day | ORAL | Status: DC
  Administered 2022-09-25 – 2022-09-26 (×2): 12 mg via ORAL
  Filled 2022-09-25 (×3): qty 2

## 2022-09-25 MED ORDER — LORAZEPAM 1 MG PO TABS
1.0000 mg | ORAL_TABLET | ORAL | Status: DC | PRN
  Filled 2022-09-25: qty 1

## 2022-09-25 MED ORDER — ZIPRASIDONE MESYLATE 20 MG IM SOLR: 20.0000 mg | INTRAMUSCULAR | Status: DC | PRN

## 2022-09-25 NOTE — Progress Notes (Addendum)
Mario Thomas is up  now. He is pacing in his room. I asked if he needs something for anxiety and he says he does,rating his anxiety a 9# on 1-10# scale with 10# being the worse. Confirmed with pt. that he still takes Invega p.o. Thinks he takes it at night. Reports had IM Invega only once. Patient however is a poor historian. Vistaril as ordered . Pt. Says he has never taken this before and,"I think it may help with my illness."
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
## 2022-09-25 NOTE — Tx Team (Signed)
Initial Treatment Plan 09/25/2022 12:35 AM [name] MWU:[id number]    PATIENT STRESSORS: Medication change or noncompliance   Other: Psychosis/Delusional      PATIENT STRENGTHS: Average or above average intelligence  Physical Health  Supportive family/friends    PATIENT IDENTIFIED PROBLEMS:   Possible/Non-Compliance with Medication      Out of Contact with Reality             DISCHARGE CRITERIA:  Ability to meet basic life and health needs Adequate post-discharge living arrangements Improved stabilization in mood, thinking, and/or behavior Motivation to continue treatment in a less acute level of care Need for constant or close observation no longer present Reduction of life-threatening or endangering symptoms to within safe limits Verbal commitment to aftercare and medication compliance  PRELIMINARY DISCHARGE PLAN: Home/Out pt. Tx  PATIENT/FAMILY INVOLVEMENT: This treatment plan has been presented to and reviewed with the patient, Mario Thomas, and/or family member, mom and dad.  The patient and family have been given the opportunity to ask questions and make suggestions.  Reatha Harps, RN 09/25/2022, 12:35 AM

## 2022-09-25 NOTE — Telephone Encounter (Signed)
Noted and appreciate your help on this manner. Patient was seen and looks to be admitted.
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
## 2022-09-25 NOTE — Progress Notes (Signed)
Recreation Therapy Notes  Patient admitted to unit 10.16.23. Due to admission within last year, no new recreation therapy assessment conducted at this time. Last assessment conducted on 7.5.23.    Reason for current admission per patient, "feeling SI and HI.  Patient reports no changes in stressors from previous admission.  Patient identified walking as a leisure interest.   Patient reports goal of being discharged today.  Patient identifies hearing voices at a level 2.  Patient identifies seeing death stone on his hand and hearing "nothing".   Information found below from assessment conducted 7.5.23.   Coping Skills:  Sports, TV, Music, Exercise, Meditate, Deep Breathing, Prayer, Talk, Avoidance, Read, Hot Bath/Shower  Leisure Interests:  Reading  Patient Strengths:  Open Minded; Learned to do things that are necessary  Area of Improvement:  Patience, Listen more    Dylynn Ketner-McCall, LRT,CTRS Joann Jorge A Miner Koral-McCall 09/25/2022 12:24 PM

## 2022-09-25 NOTE — Group Note (Signed)
Recreation Therapy Group Note   Group Topic:Other  Group Date: 09/25/2022 Start Time: 1005 End Time: 8469 Facilitators: Olumide Dolinger-McCall, LRT,CTRS Location: [address] Dayroom   Goal Area(s) Addresses:  Patient will express the benefits of music to them. Patient will identify the impact of music for them post d/c.  Group Description: Music Therapy.  Patients were given to opportunity to request songs they wanted to hear.  Patients picked songs that got them moving, relaxed them or was something special to them. Patients could sing along, dance to or just listen as the music played during group session.   Affect/Mood: Appropriate   Participation Level: Engaged   Participation Quality: Minimal Cues   Behavior: Anxious   Speech/Thought Process: Logical   Insight: Fair   Judgement: Fair    Modes of Intervention: Music   Patient Response to Interventions:  Engaged   Education Outcome:  Acknowledges education and In group clarification offered    Clinical Observations/Individualized Feedback: Pt was walking in circles and spinning around and needed some redirection.  Pt was able to settle a little before sitting down for a while.  Pt was called out of group to meet with social worker and did not return.    Plan: Continue to engage patient in RT group sessions 2-3x/week.   Gianni Mihalik-McCall, LRT,CTRS 09/25/2022 12:01 PM

## 2022-09-25 NOTE — Progress Notes (Signed)
Pt is A&OX4, calm, flat, denies suicidal ideations, denies homicidal ideations, denies auditory hallucinations and denies visual hallucinations. Pt verbally agrees to approach staff if these become apparent and before harming self or others. Pt denies experiencing nightmares. Mood and affect are congruent. Pt appetite is ok. No complaints of anxiety, distress, pain and/or discomfort at this time. Pt in bed resting. Pt's memory appears to be grossly intact, and Pt hasn't displayed any injurious behaviors. Pt is medication compliant. There's no evidence of suicidal intent. Psychomotor activity was WNL. No s/s of Parkinson, Dystonia, Akathisia and/or Tardive Dyskinesia noted.
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
## 2022-09-26 ENCOUNTER — Encounter (HOSPITAL_COMMUNITY): Payer: Self-pay

## 2022-09-26 DIAGNOSIS — F250 Schizoaffective disorder, bipolar type: Secondary | ICD-10-CM | POA: Diagnosis not present

## 2022-09-26 LAB — RAPID URINE DRUG SCREEN, HOSP PERFORMED
Amphetamines: NOT DETECTED
Barbiturates: NOT DETECTED
Benzodiazepines: NOT DETECTED
Cocaine: NOT DETECTED
Opiates: NOT DETECTED
Tetrahydrocannabinol: NOT DETECTED

## 2022-09-26 MED ORDER — LORAZEPAM 1 MG PO TABS
2.0000 mg | ORAL_TABLET | Freq: Once | ORAL | Status: AC
  Administered 2022-09-26: 2 mg via ORAL
  Filled 2022-09-26: qty 2

## 2022-09-26 NOTE — BHH Group Notes (Signed)
Patient did not attend the therapeutic group. 

## 2022-09-26 NOTE — Group Note (Signed)
Recreation Therapy Group Note   Group Topic:Relaxation  Group Date: 09/26/2022 Start Time: 1000 End Time: 2353 Facilitators: Kaislyn Gulas-McCall, LRT,CTRS Location: [address] Dayroom   Goal Area(s) Addresses:  Patient will successfully identify songs that help them relax. Patient will identify healthy ways to increase relaxation. Patient will acknowledge benefit(s) of relaxation.  Group Description:  Relaxation Music.  Patients were allowed to pick songs that helped them relax and have meaning to them.  LRT played the songs requested by patients.  Patients sang a long with and moved around to the beat of the songs being played during group session.   Affect/Mood: Appropriate   Participation Level: Engaged   Participation Quality: Independent   Behavior: Appropriate   Speech/Thought Process: Focused   Insight: Good   Judgement: Good   Modes of Intervention: Music   Patient Response to Interventions:  Engaged   Education Outcome:  Acknowledges education and In group clarification offered    Clinical Observations/Individualized Feedback: Pt was attentive and social with peers.  Pt was appropriate throughout group session.   Pt sang and snapped his fingers along with some of the music.       Plan: Continue to engage patient in RT group sessions 2-3x/week.   Min Tunnell-McCall, LRT,CTRS 09/26/2022 12:58 PM

## 2022-09-26 NOTE — Progress Notes (Signed)
Pt noted with increased anxiety, restlessness and pacing at the beginning to this shift. PRN Vistaril 25 mg given at 0854 without effect when reassessed at 0950. Observed with active AVH "I see demons, they are everywhere, I hear them" tearful, pacing, jumping, lying and rolling on floor with intermittent spontaneous running in hall. Verbal redirections were ineffective as pt continues to escalate in his behavior. PRN Zyprexa Zydis 5 mg and OTO of Ativan 2 mg PO both given at approximately 1115. Pt continues to need verbal redirections but is cooperative with care when reassessed at 1215. Went off unit for lunch with peers, returned without issues. Safety checks maintained at Q 15 minutes intervals. Continues support, reassurance and encouragement provided to pt.

## 2022-09-26 NOTE — Group Note (Signed)
Type of Therapy and Topic:  Group Therapy:  Stress Management   Participation Level:  Did Not Attend    Description of Group:  Patients in this group were introduced to the idea of stress and encouraged to discuss negative and positive ways to manage stress. Patients discussed specific stressors that they have in their life right now and the physical signs and symptoms associated with that stress.  Patient encouraged to come up with positive changes to assist with the stress upon discharge in order to prevent future hospitalizations.   They also worked as a group on developing a specific plan for several patients to deal with stressors through boundary-setting, psychoeducation and self care techniques   Therapeutic Goals:               1)  To discuss the positive and negative impacts of stress             2)  identify signs and symptoms of stress             3)  generate ideas for stress management             4)  offer mutual support to others regarding stress management             5)  Developing plans for ways to manage specific stressors upon discharge               Summary of Patient Progress:  CSW and multiple staff tried to engage patient in participating in group with no luck.  Patient continues to decline participation.    Therapeutic Modalities:   Motivational Interviewing Brief Solution-Focused Therapy   Domingos Riggi, LCSW, LCAS Clincal Social Worker  [facility]    

## 2022-09-26 NOTE — Progress Notes (Signed)
[facility] MD Progress Note  09/26/2022 3:24 PM Mario Thomas  MRN:  [id number] Principal Problem: Schizoaffective disorder, bipolar type ([place]) Diagnosis: Active Problems:   Anxiety state   Insomnia   Paranoid schizophrenia ([place])  History of Present Illness: Mario Thomas is a 21 yo African American male who walked into this [facility] on 09/24/22 with complaints of suicidal and homicidal ideations. Pt was admitted voluntarily for treatment and stabilization of his mood.  Pt with prior hospitalizations at this [facility] from 11/09/21 through 11/17/2021 for manic type behaviors, and from 06/11/22 through 06/20/22 for bizarre behaviors and making threats to his parents.    24 hr chart review: BP slightly elevated earlier today morning with SBP in the 140s. Pt slept for a total of 7.25 hrs last night as per nursing flow sheets. Pt has taken his scheduled Invega as ordered, and has required the following PRN meds in the past 24 hrs; Hydroxyzine 25 mg earlier today morning, Zyprexa 5 mg earlier today afternoon, Ativan 2 mg earlier this afternoon and Trazodone 50 mg last night for insomnia. As per nursing documentation, pt's psychosis has been persistent from yesterday through today.  Today's patient assessment note: Pt met at the entrance of the 500 hall.  Patient jumping up and down speaking rapidly, stating that he is being possessed, asking for an exorcist to come deliver him from demons.  Patient reported that he was seeing multiple demons on the unit and acting bizarrely; patient went from jumping up and down, and speaking rapidly, to lying on the floor and rolling around.  Patient reported that he could see the demons with out to get him and that there were multiple of them.  For this encounter, patient presented with +AVH of demons and paranoia as well as delusional thinking. Speech is tangential and pressured. Attention to personal hygiene and grooming is fair.   Patient reports that his sleep quality last night was good  and reports a good appetite.  He denies being in any physical distress, and denies any medication related side effects.  We will continue Invega 12 mg nightly, and continue to monitor.No TD/EPS type symptoms found on assessment, and pt denies any feelings of stiffness. AIMS: 0.   Total Time spent with patient: 30 minutes  Past Psychiatric History: psychosis  Past Medical History:  Past Medical History:  Diagnosis Date   Heart murmur    MDD (major depressive disorder), severe ([place]) 08/23/2018   History reviewed. No pertinent surgical history. Family History:  Family History  Problem Relation Age of Onset   Hyperlipidemia Father    Family Psychiatric  History: non reported Social History:  Social History   Substance and Sexual Activity  Alcohol Use Not Currently     Social History   Substance and Sexual Activity  Drug Use Never   Comment: Denies    Social History   Socioeconomic History   Marital status: Single    Spouse name: Not on file   Number of children: Not on file   Years of education: Not on file   Highest education level: Not on file  Occupational History   Not on file  Tobacco Use   Smoking status: Never   Smokeless tobacco: Never  Vaping Use   Vaping Use: Never used  Substance and Sexual Activity   Alcohol use: Not Currently   Drug use: Never    Comment: Denies   Sexual activity: Never  Other Topics Concern   Not on file  Social  History Narrative   01/23/21   From: the area   Living: with parents   Work: Primary school teacher: Estate manager/land agent at [employer]: good relationship with parents, 1 sibling      Enjoys: running      Exercise: daily running    Diet: meat, veggies, grains      Safety   Seat belts: Yes    Guns: No   Safe in relationships: Yes    Social Determinants of Radio broadcast assistant Strain: Not on file  Food Insecurity: No Food Insecurity (09/24/2022)   Hunger Vital Sign    Worried About Running Out of  Food in the Last Year: Never true    [place] in the Last Year: Never true  Transportation Needs: No Transportation Needs (09/24/2022)   PRAPARE - Hydrologist (Medical): No    Lack of Transportation (Non-Medical): No  Physical Activity: Not on file  Stress: Not on file  Social Connections: Not on file   Additional Social History:     Sleep: Good  Appetite:  Good  Current Medications: Current Facility-Administered Medications  Medication Dose Route Frequency Provider Last Rate Last Admin   acetaminophen (TYLENOL) tablet 650 mg  650 mg Oral Q6H PRN Ntuen, Kris Hartmann, FNP       alum & mag hydroxide-simeth (MAALOX/MYLANTA) 200-200-20 MG/5ML suspension 30 mL  30 mL Oral Q4H PRN Ntuen, Kris Hartmann, FNP       hydrOXYzine (ATARAX) tablet 25 mg  25 mg Oral TID PRN Laretta Bolster, FNP   25 mg at 09/26/22 0854   OLANZapine zydis (ZYPREXA) disintegrating tablet 5 mg  5 mg Oral Q8H PRN Nicholes Rough, NP   5 mg at 09/26/22 1111   And   LORazepam (ATIVAN) tablet 1 mg  1 mg Oral PRN Nicholes Rough, NP       And   ziprasidone (GEODON) injection 20 mg  20 mg Intramuscular PRN Neila Teem, NP       magnesium hydroxide (MILK OF MAGNESIA) suspension 30 mL  30 mL Oral Daily PRN Ntuen, Kris Hartmann, FNP       paliperidone (INVEGA) 24 hr tablet 12 mg  12 mg Oral QHS Jadi Deyarmin, NP   12 mg at 09/25/22 2035   traZODone (DESYREL) tablet 50 mg  50 mg Oral QHS PRN Laretta Bolster, FNP   50 mg at 09/25/22 2035    Lab Results:  Results for orders placed or performed during the hospital encounter of 09/24/22 (from the past 48 hour(s))  SARS Coronavirus 2 by RT PCR (hospital order, performed in [facility] hospital lab) *cepheid single result test* Anterior Nasal Swab     Status: None   Collection Time: 09/24/22  5:05 PM   Specimen: Anterior Nasal Swab  Result Value Ref Range   SARS Coronavirus 2 by RT PCR NEGATIVE NEGATIVE    Comment: (NOTE) SARS-CoV-2 target nucleic acids are NOT  DETECTED.  The SARS-CoV-2 RNA is generally detectable in upper and lower respiratory specimens during the acute phase of infection. The lowest concentration of SARS-CoV-2 viral copies this assay can detect is 250 copies / mL. A negative result does not preclude SARS-CoV-2 infection and should not be used as the sole basis for treatment or other patient management decisions.  A negative result may occur with improper specimen collection / handling, submission of specimen other than nasopharyngeal swab, presence of  viral mutation(s) within the areas targeted by this assay, and inadequate number of viral copies (<250 copies / mL). A negative result must be combined with clinical observations, patient history, and epidemiological information.  Fact Sheet for Patients:   https://www.patel.info/  Fact Sheet for Healthcare Providers: https://hall.com/  This test is not yet approved or  cleared by the [place] FDA and has been authorized for detection and/or diagnosis of SARS-CoV-2 by FDA under an Emergency Use Authorization (EUA).  This EUA will remain in effect (meaning this test can be used) for the duration of the COVID-19 declaration under Section 564(b)(1) of the Act, 21 U.S.C. section 360bbb-3(b)(1), unless the authorization is terminated or revoked sooner.  Performed at [facility], [place] [address] [place], [place] [address]   Urinalysis, Complete w Microscopic Urine, Random     Status: Abnormal   Collection Time: 09/25/22  4:31 AM  Result Value Ref Range   Color, Urine YELLOW YELLOW   APPearance TURBID (A) CLEAR   Specific Gravity, Urine 1.028 1.005 - 1.030   pH 5.0 5.0 - 8.0   Glucose, UA NEGATIVE NEGATIVE mg/dL   Hgb urine dipstick NEGATIVE NEGATIVE   Bilirubin Urine NEGATIVE NEGATIVE   Ketones, ur NEGATIVE NEGATIVE mg/dL   Protein, ur NEGATIVE NEGATIVE mg/dL   Nitrite NEGATIVE NEGATIVE   Leukocytes,Ua  NEGATIVE NEGATIVE   Bacteria, UA RARE (A) NONE SEEN    Comment: Performed at [facility] [address], [place], [place] [address]  CBC     Status: Abnormal   Collection Time: 09/25/22  6:47 AM  Result Value Ref Range   WBC 4.4 4.0 - 10.5 K/uL   RBC 5.04 4.22 - 5.81 MIL/uL   Hemoglobin 15.0 13.0 - 17.0 g/dL   HCT 46.0 39.0 - 52.0 %   MCV 91.3 80.0 - 100.0 fL   MCH 29.8 26.0 - 34.0 pg   MCHC 32.6 30.0 - 36.0 g/dL   RDW 12.6 11.5 - 15.5 %   Platelets 146 (L) 150 - 400 K/uL   nRBC 0.0 0.0 - 0.2 %    Comment: Performed at [facility], [place] [address] [place], [place] [address]  Comprehensive metabolic panel     Status: Abnormal   Collection Time: 09/25/22  6:47 AM  Result Value Ref Range   Sodium 137 135 - 145 mmol/L   Potassium 3.8 3.5 - 5.1 mmol/L   Chloride 102 98 - 111 mmol/L   CO2 28 22 - 32 mmol/L   Glucose, Bld 104 (H) 70 - 99 mg/dL    Comment: Glucose reference range applies only to samples taken after fasting for at least 8 hours.   BUN 14 6 - 20 mg/dL   Creatinine, Ser 0.75 0.61 - 1.24 mg/dL   Calcium 9.0 8.9 - 10.3 mg/dL   Total Protein 7.3 6.5 - 8.1 g/dL   Albumin 4.2 3.5 - 5.0 g/dL   AST 17 15 - 41 U/L   ALT 16 0 - 44 U/L   Alkaline Phosphatase 67 38 - 126 U/L   Total Bilirubin 0.9 0.3 - 1.2 mg/dL   GFR, Estimated >60 >60 mL/min    Comment: (NOTE) Calculated using the CKD-EPI Creatinine Equation (2021)    Anion gap 7 5 - 15    Comment: Performed at [facility], [place] [address] [place],  [address]  Hemoglobin A1c     Status: None   Collection Time: 09/25/22  6:47 AM  Result Value Ref Range   Hgb A1c MFr Bld 4.8 4.8 - 5.6 %    Comment: (NOTE) Pre diabetes:          5.7%-6.4%  Diabetes:              >6.4%  Glycemic control for   <7.0% adults with diabetes    Mean Plasma Glucose 91.06 mg/dL    Comment: Performed at [facility] [address], [place], [place] [address]  Lipid panel      Status: None   Collection Time: 09/25/22  6:47 AM  Result Value Ref Range   Cholesterol 122 0 - 200 mg/dL   Triglycerides 27 <150 mg/dL   HDL 42 >40 mg/dL   Total CHOL/HDL Ratio 2.9 RATIO   VLDL 5 0 - 40 mg/dL   LDL Cholesterol 75 0 - 99 mg/dL    Comment:        Total Cholesterol/HDL:CHD Risk Coronary Heart Disease Risk Table                     Men   Women  1/2 Average Risk   3.4   3.3  Average Risk       5.0   4.4  2 X Average Risk   9.6   7.1  3 X Average Risk  23.4   11.0        Use the calculated Patient Ratio above and the CHD Risk Table to determine the patient's CHD Risk.        ATP III CLASSIFICATION (LDL):  <100     mg/dL   Optimal  100-129  mg/dL   Near or Above                    Optimal  130-159  mg/dL   Borderline  160-189  mg/dL   High  >190     mg/dL   Very High Performed at [facility] [address] [place], [place] [address]   TSH     Status: None   Collection Time: 09/25/22  6:47 AM  Result Value Ref Range   TSH 3.122 0.350 - 4.500 uIU/mL    Comment: Performed by a 3rd Generation assay with a functional sensitivity of <=0.01 uIU/mL. Performed at [facility], [place] [address], [place], [place] [address]   Rapid urine drug screen (hospital performed)     Status: None   Collection Time: 09/25/22  8:53 PM  Result Value Ref Range   Opiates NONE DETECTED NONE DETECTED   Cocaine NONE DETECTED NONE DETECTED   Benzodiazepines NONE DETECTED NONE DETECTED   Amphetamines NONE DETECTED NONE DETECTED   Tetrahydrocannabinol NONE DETECTED NONE DETECTED   Barbiturates NONE DETECTED NONE DETECTED    Comment: (NOTE) DRUG SCREEN FOR MEDICAL PURPOSES ONLY.  IF CONFIRMATION IS NEEDED FOR ANY PURPOSE, NOTIFY LAB WITHIN 5 DAYS.  LOWEST DETECTABLE LIMITS FOR URINE DRUG SCREEN Drug Class                     Cutoff (ng/mL) Amphetamine and metabolites    1000 Barbiturate and metabolites    200 Benzodiazepine                  200 Opiates and metabolites        300 Cocaine and metabolites        300 THC  50 Performed at [facility], [place] [address], [place], [place] [address]     Blood Alcohol level:  Lab Results  Component Value Date   ETH <10 09/09/2022   ETH <10 95/32/0233    Metabolic Disorder Labs: Lab Results  Component Value Date   HGBA1C 4.8 09/25/2022   MPG 91.06 09/25/2022   MPG 93.93 05/28/2022   Lab Results  Component Value Date   PROLACTIN 10.0 08/24/2018   Lab Results  Component Value Date   CHOL 122 09/25/2022   TRIG 27 09/25/2022   HDL 42 09/25/2022   CHOLHDL 2.9 09/25/2022   VLDL 5 09/25/2022   LDLCALC 75 09/25/2022   LDLCALC 41 05/28/2022    Physical Findings: AIMS:  , ,  ,  ,    CIWA:    COWS:     Musculoskeletal: Strength & Muscle Tone: within normal limits Gait & Station: normal Patient leans: N/A  Psychiatric Specialty Exam:  Presentation  General Appearance:  Appropriate for Environment; Fairly Groomed  Eye Contact: Good  Speech: Pressured  Speech Volume: Normal  Handedness: Right   Mood and Affect  Mood: Anxious; Depressed  Affect: Congruent   Thought Process  Thought Processes: Disorganized  Descriptions of Associations:Tangential  Orientation:Partial  Thought Content:Illogical  History of Schizophrenia/Schizoaffective disorder:Yes  Duration of Psychotic Symptoms:Greater than six months  Hallucinations:Hallucinations: Auditory; Visual Description of Auditory Hallucinations: voices telling him that he has cancer Description of Visual Hallucinations: angels, mother of Jesus  Ideas of Reference:Paranoia; Delusions  Suicidal Thoughts:Suicidal Thoughts: No  Homicidal Thoughts:Homicidal Thoughts: No   Sensorium  Memory: Immediate Good  Judgment: Poor  Insight: Poor   Executive Functions  Concentration: Poor  Attention Span: Poor  Recall: [place] of  Knowledge: Fair  Language: Fair  Psychomotor Activity  Psychomotor Activity: Psychomotor Activity: Normal  Assets  Assets: Housing; Social Support  Sleep  Sleep: Sleep: Good  Physical Exam: Physical Exam Constitutional:      Appearance: Normal appearance.  HENT:     Head: Normocephalic.     Nose: No congestion or rhinorrhea.  Eyes:     Pupils: Pupils are equal, round, and reactive to light.  Pulmonary:     Effort: Pulmonary effort is normal.  Musculoskeletal:        General: Normal range of motion.     Cervical back: Normal range of motion.  Neurological:     Mental Status: He is alert and oriented to person, place, and time.    Review of Systems  Constitutional: Negative.   HENT: Negative.    Eyes: Negative.   Respiratory:  Negative for cough.   Cardiovascular:  Negative for chest pain.  Gastrointestinal: Negative.  Negative for heartburn.  Genitourinary: Negative.   Musculoskeletal: Negative.   Skin: Negative.   Neurological: Negative.   Psychiatric/Behavioral:  Positive for depression and hallucinations. Negative for memory loss, substance abuse and suicidal ideas. The patient is nervous/anxious and has insomnia.    Blood pressure (!) 140/83, pulse 79, temperature 98 F (36.7 C), temperature source Oral, resp. rate 18, height 5' 10"  (1.778 m), weight 97.1 kg, SpO2 99 %. Body mass index is 30.71 kg/m.  Treatment Plan Summary:  Daily contact with patient to assess and evaluate symptoms and progress in treatment and Medication management   Observation Level/Precautions:  15 minute checks  Laboratory:  Labs reviewed   Psychotherapy:  Unit Group sessions  Medications:  See [facility]  Consultations:  To be determined   Discharge Concerns:  Safety, medication compliance, mood stability  Estimated LOS: 5-7 days  Other:  N/A      PLAN Safety and Monitoring: Voluntary admission to inpatient psychiatric unit for safety, stabilization and treatment Daily contact  with patient to assess and evaluate symptoms and progress in treatment Patient's case to be discussed in multi-disciplinary team meeting Observation Level : q15 minute checks Vital signs: q12 hours Precautions: Safety   Long Term Goal(s): Improvement in symptoms so as ready for discharge   Short Term Goals: Ability to identify changes in lifestyle to reduce recurrence of condition will improve, Ability to disclose and discuss suicidal ideas, Ability to demonstrate self-control will improve, Ability to identify and develop effective coping behaviors will improve, Compliance with prescribed medications will improve, and Ability to identify triggers associated with substance abuse/mental health issues will improve   Diagnoses  Active Problems:   Anxiety state   Insomnia   Paranoid schizophrenia (HCC)   Medications -Continue Invega 12 mg nightly for psychosis -Continue Trazodone 50 mg nightly PRN for insomnia -Continue Hydroxyzine 25 mg TID PRN for anxiety -Continue Agitation protocol (Zyprexa, Ativan, Geodon PRN)-See MAR   Other PRNS -Continue Tylenol 650 mg every 6 hours PRN for mild pain -Continue Maalox 30 mg every 4 hrs PRN for indigestion -Continue Milk of Magnesia as needed every 6 hrs for constipation   Discharge Planning: Social work and case management to assist with discharge planning and identification of hospital follow-up needs prior to discharge Estimated LOS: 5-7 days Discharge Concerns: Need to establish a safety plan; Medication compliance and effectiveness Discharge Goals: Return home with outpatient referrals for mental health follow-up including medication management/psychotherapy   I certify that inpatient services furnished can reasonably be expected to improve the patient's condition.   Nicholes Rough, NP 09/26/2022, 3:24 PM

## 2022-09-26 NOTE — BH IP Treatment Plan (Signed)
Interdisciplinary Treatment and Diagnostic Plan Update  09/26/2022 Time of Session: [address] MRN: [id number]  Principal Diagnosis: Schizoaffective disorder, bipolar type ([place])  Secondary Diagnoses: Active Problems:   Anxiety state   Insomnia   Paranoid schizophrenia ([place])   Current Medications:  Current Facility-Administered Medications  Medication Dose Route Frequency Provider Last Rate Last Admin   acetaminophen (TYLENOL) tablet 650 mg  650 mg Oral Q6H PRN Ntuen, Kris Hartmann, FNP       alum & mag hydroxide-simeth (MAALOX/MYLANTA) 200-200-20 MG/5ML suspension 30 mL  30 mL Oral Q4H PRN Ntuen, Kris Hartmann, FNP       hydrOXYzine (ATARAX) tablet 25 mg  25 mg Oral TID PRN Laretta Bolster, FNP   25 mg at 09/26/22 0854   OLANZapine zydis (ZYPREXA) disintegrating tablet 5 mg  5 mg Oral Q8H PRN Nicholes Rough, NP   5 mg at 09/26/22 1111   And   LORazepam (ATIVAN) tablet 1 mg  1 mg Oral PRN Nicholes Rough, NP       And   ziprasidone (GEODON) injection 20 mg  20 mg Intramuscular PRN Nkwenti, Doris, NP       magnesium hydroxide (MILK OF MAGNESIA) suspension 30 mL  30 mL Oral Daily PRN Ntuen, Kris Hartmann, FNP       paliperidone (INVEGA) 24 hr tablet 12 mg  12 mg Oral QHS Nkwenti, Doris, NP   12 mg at 09/25/22 2035   traZODone (DESYREL) tablet 50 mg  50 mg Oral QHS PRN Laretta Bolster, FNP   50 mg at 09/25/22 2035   PTA Medications: Medications Prior to Admission  Medication Sig Dispense Refill Last Dose   benztropine (COGENTIN) 1 MG tablet Take 1 tablet (1 mg total) by mouth 2 (two) times daily. (Patient not taking: Reported on 08/20/2022) 60 tablet 0    INVEGA SUSTENNA 234 MG/1.5ML injection Inject 234 mg into the muscle every 30 (thirty) days.      paliperidone (INVEGA) 9 MG 24 hr tablet Take 1 tablet (9 mg total) by mouth daily. 30 tablet 0    traZODone (DESYREL) 50 MG tablet Take 1 tablet (50 mg total) by mouth at bedtime as needed for sleep. (Patient not taking: Reported on 08/20/2022) 30 tablet 0      Patient Stressors: Medication change or noncompliance   Other: Psychosis/Delusional     Patient Strengths: Average or above average intelligence  Physical Health  Supportive family/friends   Treatment Modalities: Medication Management, Group therapy, Case management,  1 to 1 session with clinician, Psychoeducation, Recreational therapy.   Physician Treatment Plan for Primary Diagnosis: Schizoaffective disorder, bipolar type ([place]) Long Term Goal(s): Improvement in symptoms so as ready for discharge   Short Term Goals: Ability to identify changes in lifestyle to reduce recurrence of condition will improve Ability to disclose and discuss suicidal ideas Ability to demonstrate self-control will improve Ability to identify and develop effective coping behaviors will improve Compliance with prescribed medications will improve Ability to identify triggers associated with substance abuse/mental health issues will improve  Medication Management: Evaluate patient's response, side effects, and tolerance of medication regimen.  Therapeutic Interventions: 1 to 1 sessions, Unit Group sessions and Medication administration.  Evaluation of Outcomes: Progressing  Physician Treatment Plan for Secondary Diagnosis: Active Problems:   Anxiety state   Insomnia   Paranoid schizophrenia ([place])  Long Term Goal(s): Improvement in symptoms so as ready for discharge   Short Term Goals: Ability to identify changes in lifestyle to reduce recurrence  of condition will improve Ability to disclose and discuss suicidal ideas Ability to demonstrate self-control will improve Ability to identify and develop effective coping behaviors will improve Compliance with prescribed medications will improve Ability to identify triggers associated with substance abuse/mental health issues will improve     Medication Management: Evaluate patient's response, side effects, and tolerance of medication  regimen.  Therapeutic Interventions: 1 to 1 sessions, Unit Group sessions and Medication administration.  Evaluation of Outcomes: Progressing   RN Treatment Plan for Primary Diagnosis: Schizoaffective disorder, bipolar type ([facility]) Long Term Goal(s): Knowledge of disease and therapeutic regimen to maintain health will improve  Short Term Goals: Ability to remain free from injury will improve, Ability to verbalize frustration and anger appropriately will improve, Ability to demonstrate self-control, Ability to participate in decision making will improve, Ability to verbalize feelings will improve, Ability to disclose and discuss suicidal ideas, Ability to identify and develop effective coping behaviors will improve, and Compliance with prescribed medications will improve  Medication Management: RN will administer medications as ordered by provider, will assess and evaluate patient's response and provide education to patient for prescribed medication. RN will report any adverse and/or side effects to prescribing provider.  Therapeutic Interventions: 1 on 1 counseling sessions, Psychoeducation, Medication administration, Evaluate responses to treatment, Monitor vital signs and CBGs as ordered, Perform/monitor CIWA, COWS, AIMS and Fall Risk screenings as ordered, Perform wound care treatments as ordered.  Evaluation of Outcomes: Progressing   LCSW Treatment Plan for Primary Diagnosis: Schizoaffective disorder, bipolar type ([facility]) Long Term Goal(s): Safe transition to appropriate next level of care at discharge, Engage patient in therapeutic group addressing interpersonal concerns.  Short Term Goals: Engage patient in aftercare planning with referrals and resources, Increase social support, Increase ability to appropriately verbalize feelings, Increase emotional regulation, Facilitate acceptance of mental health diagnosis and concerns, Facilitate patient progression through stages of change regarding  substance use diagnoses and concerns, Identify triggers associated with mental health/substance abuse issues, and Increase skills for wellness and recovery  Therapeutic Interventions: Assess for all discharge needs, 1 to 1 time with Social worker, Explore available resources and support systems, Assess for adequacy in community support network, Educate family and significant other(s) on suicide prevention, Complete Psychosocial Assessment, Interpersonal group therapy.  Evaluation of Outcomes: Progressing   Progress in Treatment: Attending groups: No. Participating in groups: No. Taking medication as prescribed: Yes. Toleration medication: Yes. Family/Significant other contact made: No, will contact:  CSW will obtain consent to reach family/friend Patient understands diagnosis: Yes. Discussing patient identified problems/goals with staff: Yes. Medical problems stabilized or resolved: Yes. Denies suicidal/homicidal ideation: Yes. Issues/concerns per patient self-inventory: Yes. Other: none  New problem(s) identified: No, Describe:  none  New Short Term/Long Term Goal(s): Patient to work towards elimination of symptoms of psychosis, medication management for mood stabilization; elimination of SI thoughts; development of comprehensive mental wellness plan.  Patient Goals:  Patient states their goal for treatment is to "get better across the  board."  Discharge Plan or Barriers: No psychosocial barriers identified at this time, patient to return to place of residence when appropriate for discharge.   Reason for Continuation of Hospitalization: Other; describe psychosis   Estimated Length of Stay: 1-7days   Last 3 [place] Suicide Severity Risk Score: Flowsheet Row Admission (Current) from [facility] from [facility]/09/2022 [facility] from 08/18/2022 in [facility]  C-SSRS RISK CATEGORY High  Risk High Risk High Risk       Last PHQ 2/9 Scores:    11/27/2019    9:41 AM 08/04/2018    9:03 AM  Depression screen PHQ 2/9  Decreased Interest 0 0  Down, Depressed, Hopeless 0 0  PHQ - 2 Score 0 0    Scribe for Treatment Team: Almedia Balls 09/26/2022 1:36 PM

## 2022-09-27 ENCOUNTER — Other Ambulatory Visit (HOSPITAL_COMMUNITY): Payer: Self-pay

## 2022-09-27 DIAGNOSIS — F250 Schizoaffective disorder, bipolar type: Secondary | ICD-10-CM | POA: Diagnosis not present

## 2022-09-27 LAB — URINALYSIS, COMPLETE (UACMP) WITH MICROSCOPIC
Bacteria, UA: NONE SEEN
Bilirubin Urine: NEGATIVE
Glucose, UA: NEGATIVE mg/dL
Hgb urine dipstick: NEGATIVE
Ketones, ur: NEGATIVE mg/dL
Leukocytes,Ua: NEGATIVE
Nitrite: NEGATIVE
Protein, ur: NEGATIVE mg/dL
Specific Gravity, Urine: 1.013 (ref 1.005–1.030)
pH: 6 (ref 5.0–8.0)

## 2022-09-27 MED ORDER — PALIPERIDONE PALMITATE ER 156 MG/ML IM SUSY
PREFILLED_SYRINGE | INTRAMUSCULAR | 0 refills | Status: DC
  Filled 2022-09-27: qty 1.5, 30d supply, fill #0

## 2022-09-27 MED ORDER — INFLUENZA VAC SPLIT QUAD 0.5 ML IM SUSY
0.5000 mL | PREFILLED_SYRINGE | INTRAMUSCULAR | Status: AC
  Administered 2022-09-28: 0.5 mL via INTRAMUSCULAR
  Filled 2022-09-27: qty 0.5

## 2022-09-27 MED ORDER — RISPERIDONE 2 MG PO TBDP
2.0000 mg | ORAL_TABLET | Freq: Two times a day (BID) | ORAL | Status: DC
  Administered 2022-09-27 – 2022-09-28 (×2): 2 mg via ORAL
  Filled 2022-09-27 (×4): qty 1

## 2022-09-27 MED ORDER — PALIPERIDONE PALMITATE ER 234 MG/1.5ML IM SUSY
PREFILLED_SYRINGE | INTRAMUSCULAR | 0 refills | Status: DC
  Filled 2022-09-27: qty 1.5, 30d supply, fill #0

## 2022-09-27 MED ORDER — PALIPERIDONE ER 6 MG PO TB24
6.0000 mg | ORAL_TABLET | Freq: Two times a day (BID) | ORAL | Status: DC
  Administered 2022-09-27: 6 mg via ORAL
  Filled 2022-09-27 (×6): qty 1

## 2022-09-27 NOTE — Plan of Care (Signed)
Problem: Education: Goal: Knowledge of [facility] Education information/materials will improve Outcome: Progressing Goal: Emotional status will improve Outcome: Progressing Goal: Mental status will improve Outcome: Progressing Goal: Verbalization of understanding the information provided will improve Outcome: Progressing   Problem: Activity: Goal: Interest or engagement in activities will improve Outcome: Progressing Goal: Sleeping patterns will improve Outcome: Progressing   Problem: Coping: Goal: Ability to verbalize frustrations and anger appropriately will improve Outcome: Progressing Goal: Ability to demonstrate self-control will improve Outcome: Progressing   Problem: Health Behavior/Discharge Planning: Goal: Identification of resources available to assist in meeting health care needs will improve Outcome: Progressing Goal: Compliance with treatment plan for underlying cause of condition will improve Outcome: Progressing   Problem: Physical Regulation: Goal: Ability to maintain clinical measurements within normal limits will improve Outcome: Progressing   Problem: Safety: Goal: Periods of time without injury will increase Outcome: Progressing   Problem: Education: Goal: Knowledge of General Education information will improve Description: Including pain rating scale, medication(s)/side effects and non-pharmacologic comfort measures Outcome: Progressing   Problem: Health Behavior/Discharge Planning: Goal: Ability to manage health-related needs will improve Outcome: Progressing   Problem: Clinical Measurements: Goal: Ability to maintain clinical measurements within normal limits will improve Outcome: Progressing Goal: Will remain free from infection Outcome: Progressing Goal: Diagnostic test results will improve Outcome: Progressing Goal: Respiratory complications will improve Outcome: Progressing Goal: Cardiovascular complication will be  avoided Outcome: Progressing   Problem: Activity: Goal: Risk for activity intolerance will decrease Outcome: Progressing   Problem: Nutrition: Goal: Adequate nutrition will be maintained Outcome: Progressing   Problem: Coping: Goal: Level of anxiety will decrease Outcome: Progressing   Problem: Elimination: Goal: Will not experience complications related to bowel motility Outcome: Progressing Goal: Will not experience complications related to urinary retention Outcome: Progressing   Problem: Pain Managment: Goal: General experience of comfort will improve Outcome: Progressing   Problem: Safety: Goal: Ability to remain free from injury will improve Outcome: Progressing   Problem: Skin Integrity: Goal: Risk for impaired skin integrity will decrease Outcome: Progressing   Problem: Education: Goal: Utilization of techniques to improve thought processes will improve Outcome: Progressing Goal: Knowledge of the prescribed therapeutic regimen will improve Outcome: Progressing   Problem: Activity: Goal: Interest or engagement in leisure activities will improve Outcome: Progressing Goal: Imbalance in normal sleep/wake cycle will improve Outcome: Progressing   Problem: Coping: Goal: Coping ability will improve Outcome: Progressing Goal: Will verbalize feelings Outcome: Progressing   Problem: Health Behavior/Discharge Planning: Goal: Ability to make decisions will improve Outcome: Progressing Goal: Compliance with therapeutic regimen will improve Outcome: Progressing   Problem: Role Relationship: Goal: Will demonstrate positive changes in social behaviors and relationships Outcome: Progressing   Problem: Safety: Goal: Ability to disclose and discuss suicidal ideas will improve Outcome: Progressing Goal: Ability to identify and utilize support systems that promote safety will improve Outcome: Progressing   Problem: Self-Concept: Goal: Will verbalize positive  feelings about self Outcome: Progressing Goal: Level of anxiety will decrease Outcome: Progressing   Problem: Education: Goal: Ability to state activities that reduce stress will improve Outcome: Progressing   Problem: Coping: Goal: Ability to identify and develop effective coping behavior will improve Outcome: Progressing   Problem: Self-Concept: Goal: Ability to identify factors that promote anxiety will improve Outcome: Progressing Goal: Level of anxiety will decrease Outcome: Progressing Goal: Ability to modify response to factors that promote anxiety will improve Outcome: Progressing   Problem: Activity: Goal: Will verbalize the importance of balancing activity with adequate rest  periods Outcome: Progressing   Problem: Education: Goal: Will be free of psychotic symptoms Outcome: Progressing Goal: Knowledge of the prescribed therapeutic regimen will improve Outcome: Progressing

## 2022-09-27 NOTE — Group Note (Signed)
Occupational Therapy Group Note  Group Topic:Coping Skills  Group Date: 09/27/2022 Start Time: 1400 End Time: 1445 Facilitators: Brantley Stage, OT   Group Description: Group encouraged increased engagement and participation through discussion and activity focused on "Coping Ahead." Patients were split up into teams and selected a card from a stack of positive coping strategies. Patients were instructed to act out/charade the coping skill for other peers to guess and receive points for their team. Discussion followed with a focus on identifying additional positive coping strategies and patients shared how they were going to cope ahead over the weekend while continuing hospitalization stay.  Therapeutic Goal(s): Identify positive vs negative coping strategies. Identify coping skills to be used during hospitalization vs coping skills outside of hospital/at home Increase participation in therapeutic group environment and promote engagement in treatment   Participation Level: Engaged   Participation Quality: Independent   Behavior: Cooperative   Speech/Thought Process: Tangential    Affect/Mood: Appropriate   Insight: Poor   Judgement: Poor   Individualization: pt was engaged but tangential in their participation of group discussion/activity. Minimal identified  Modes of Intervention: Discussion  Patient Response to Interventions:  Engaged   Plan: Continue to engage patient in OT groups 2 - 3x/week.  09/27/2022  Brantley Stage, OT  Cornell Barman, OT

## 2022-09-27 NOTE — Progress Notes (Signed)
Pt endorses passive SI. Pt stated "I need to go, I need to go for good, I need to die, if I don't how will I get better?". Pt has no plan. Pt remains safe on Q15 min checks and contracts for safety.

## 2022-09-27 NOTE — Progress Notes (Addendum)
Patient appears anxious. Patient denies SI/HI/AVH. Pt goal for the day is "sleep".  Pt reports anxiety 1/10 and depression 2/10. Patient complied with morning medication with no reported side effects. Pt was given PRN zyprexa at 0930 per MAR. Pt was pacing the halls becoming increasingly agitated and pushed on the exit doors. When taking medication pt stated "this tastes good, can I have more often" pt was educated on medications and proper usage. Pt has been observed dancing and singing in the hallway. Patient remains safe on Q84min checks and contracts for safety.       09/27/22 0839  Psych Admission Type (Psych Patients Only)  Admission Status Voluntary  Psychosocial Assessment  Patient Complaints Anxiety  Eye Contact Fair  Facial Expression Anxious  Affect Anxious  Speech Logical/coherent  Interaction Guarded  Motor Activity Fidgety  Appearance/Hygiene Unremarkable  Behavior Characteristics Cooperative;Anxious  Mood Anxious  Thought Process  Coherency Disorganized  Content Preoccupation;Paranoia  Delusions None reported or observed  Perception WDL  Hallucination None reported or observed  Judgment Poor  Confusion None  Danger to Self  Current suicidal ideation? Denies  Agreement Not to Harm Self Yes  Description of Agreement verbal  Danger to Others  Danger to Others None reported or observed

## 2022-09-27 NOTE — Progress Notes (Signed)
[facility] MD Progress Note  09/27/2022 12:42 PM [name]  MRN:  [id number] Principal Problem: Schizoaffective disorder, bipolar type ([place]) Diagnosis: Principal Problem:   Schizoaffective disorder, bipolar type ([place]) Active Problems:   Anxiety state   Insomnia   Paranoid schizophrenia ([place])  History of Present Illness: Mario Thomas is a 21 yo [place] American male with past psychiatric history of bipolar disorder with psychotic features, schizoaffective disorder, paranoid schizophrenia, multiple inpatient psychiatric hospitalizations who presented as walked in at [place] 09/24/22 with chief complaint of suicidal and homicidal ideations. Most recently hospitalized at [facility] 07/03-12/23 for aggressive, bizarre behaviors towards his parents. Pt was admitted voluntarily for treatment and stabilization of his mood.    24 hr chart review: Patient BP appears to have stabilized, HR remains elevated earlier today morning with SBP in the 140s; most recent HR 107. Pt slept for a total of 7.25 hrs last night as per nursing flow sheets. Pt's been medication compliant with Acetaminophen 650 mg given 0754 for mild pain, Zyprexa 5 mg 0930 for some agitation, Trazodone 50 mg 1047 for insomnia. Per nursing documentation, patient remains psychotic and visible in milieu;  disruptive in am recreation group and noted to becoming increasingly agitated pushing on exit doors, singing and dancing in the hallway.    Today's patient assessment note: Pt observed pacing the 500 hall; easily redirected for assessment and met in his room. He appears calm and cooperative for assessment; able to remain engaged. Euthymic mood. Flat affect. Speech normal. Delusional thought content. Appropriate hygiene. He states reason for admission as "this would make my 8th admission and 8 is the number needed to make my illness go away like COVID-19". Reports patient from [facility] told him this. No reports of demons. Provider discussed current  medication regimen and LAI options; he denies any side effects and expressed interest in LAI "I think that would be better for me".  He denies any thoughts of wanting to harm himself or anyone, paranoia, or visual hallucinations. Reports patient from [facility] as "voice" that told him his 8th hospitalization would make his illness go away; denies any other contents from this voice. Initial plan was to adjust Invega 12 mg nightly to 6 mg BID and continue to monitor, however due to noted cost for patient upon discharge, patient switched to Risperidone 2 mg BID. No TD/EPS type symptoms found on assessment, and pt denies any feelings of stiffness. AIMS: 0. Will continue to monitor for any side effects.   On reassessment patient observed, continued pacing the hallway. When asked about how he was feeling he responded, "Like a zombie, I'm a zombie" then began speaking in an animated voice. Labs reviewed, urine noted as 'turbid'; Urinalysis, culture re-ordered.   Total Time spent with patient: 30 minutes  Past Psychiatric History: schizoaffective disorder, bipolar disorder with psychotic features, medication non-compliance, [facility] admissions  Past Medical History:  Past Medical History:  Diagnosis Date   Heart murmur    MDD (major depressive disorder), severe ([place]) 08/23/2018   History reviewed. No pertinent surgical history. Family History:  Family History  Problem Relation Age of Onset   Hyperlipidemia Father    Family Psychiatric  History: none reported Social History:  Social History   Substance and Sexual Activity  Alcohol Use Not Currently     Social History   Substance and Sexual Activity  Drug Use Never   Comment: Denies    Social History   Socioeconomic History   Marital status: Single  Spouse name: Not on file   Number of children: Not on file   Years of education: Not on file   Highest education level: Not on file  Occupational History   Not on file  Tobacco Use    Smoking status: Never   Smokeless tobacco: Never  Vaping Use   Vaping Use: Never used  Substance and Sexual Activity   Alcohol use: Not Currently   Drug use: Never    Comment: Denies   Sexual activity: Never  Other Topics Concern   Not on file  Social History Narrative   01/23/21   From: the area   Living: with parents   Work: Primary school teacher: Estate manager/land agent at [employer]: good relationship with parents, 1 sibling      Enjoys: running      Exercise: daily running    Diet: meat, veggies, grains      Safety   Seat belts: Yes    Guns: No   Safe in relationships: Yes    Social Determinants of Radio broadcast assistant Strain: Not on file  Food Insecurity: No Food Insecurity (09/24/2022)   Hunger Vital Sign    Worried About Running Out of Food in the Last Year: Never true    [place] in the Last Year: Never true  Transportation Needs: No Transportation Needs (09/24/2022)   PRAPARE - Hydrologist (Medical): No    Lack of Transportation (Non-Medical): No  Physical Activity: Not on file  Stress: Not on file  Social Connections: Not on file   Additional Social History:     Sleep: Good  Appetite:  Good  Current Medications: Current Facility-Administered Medications  Medication Dose Route Frequency Provider Last Rate Last Admin   acetaminophen (TYLENOL) tablet 650 mg  650 mg Oral Q6H PRN Laretta Bolster, FNP   650 mg at 09/27/22 0754   alum & mag hydroxide-simeth (MAALOX/MYLANTA) 200-200-20 MG/5ML suspension 30 mL  30 mL Oral Q4H PRN Ntuen, Kris Hartmann, FNP       hydrOXYzine (ATARAX) tablet 25 mg  25 mg Oral TID PRN Laretta Bolster, FNP   25 mg at 09/26/22 2031   [START ON 09/28/2022] influenza vac split quadrivalent PF (FLUARIX) injection 0.5 mL  0.5 mL Intramuscular Tomorrow-1000 Massengill, Ovid Curd, MD       OLANZapine zydis (ZYPREXA) disintegrating tablet 5 mg  5 mg Oral Q8H PRN Nicholes Rough, NP   5 mg at 09/27/22 0930    And   LORazepam (ATIVAN) tablet 1 mg  1 mg Oral PRN Nicholes Rough, NP       And   ziprasidone (GEODON) injection 20 mg  20 mg Intramuscular PRN Nkwenti, Doris, NP       magnesium hydroxide (MILK OF MAGNESIA) suspension 30 mL  30 mL Oral Daily PRN Ntuen, Kris Hartmann, FNP       risperiDONE (RISPERDAL M-TABS) disintegrating tablet 2 mg  2 mg Oral BID Leevy-Johnson, Casey Maxfield A, NP       traZODone (DESYREL) tablet 50 mg  50 mg Oral QHS PRN Ntuen, Kris Hartmann, FNP   50 mg at 09/26/22 2031    Lab Results:  Results for orders placed or performed during the hospital encounter of 09/24/22 (from the past 48 hour(s))  Rapid urine drug screen (hospital performed)     Status: None   Collection Time: 09/25/22  8:53 PM  Result Value Ref Range  Opiates NONE DETECTED NONE DETECTED   Cocaine NONE DETECTED NONE DETECTED   Benzodiazepines NONE DETECTED NONE DETECTED   Amphetamines NONE DETECTED NONE DETECTED   Tetrahydrocannabinol NONE DETECTED NONE DETECTED   Barbiturates NONE DETECTED NONE DETECTED    Comment: (NOTE) DRUG SCREEN FOR MEDICAL PURPOSES ONLY.  IF CONFIRMATION IS NEEDED FOR ANY PURPOSE, NOTIFY LAB WITHIN 5 DAYS.  LOWEST DETECTABLE LIMITS FOR URINE DRUG SCREEN Drug Class                     Cutoff (ng/mL) Amphetamine and metabolites    1000 Barbiturate and metabolites    200 Benzodiazepine                 200 Opiates and metabolites        300 Cocaine and metabolites        300 THC                            50 Performed at [facility], [place] [address] [place], [place] [address]     Blood Alcohol level:  Lab Results  Component Value Date   ETH <10 09/09/2022   ETH <10 63/84/6659    Metabolic Disorder Labs: Lab Results  Component Value Date   HGBA1C 4.8 09/25/2022   MPG 91.06 09/25/2022   MPG 93.93 05/28/2022   Lab Results  Component Value Date   PROLACTIN 10.0 08/24/2018   Lab Results  Component Value Date   CHOL 122 09/25/2022   TRIG 27 09/25/2022    HDL 42 09/25/2022   CHOLHDL 2.9 09/25/2022   VLDL 5 09/25/2022   LDLCALC 75 09/25/2022   LDLCALC 41 05/28/2022    Physical Findings: AIMS:  , ,  ,  ,    CIWA:    COWS:     Musculoskeletal: Strength & Muscle Tone: within normal limits Gait & Station: normal Patient leans: N/A  Psychiatric Specialty Exam:  Presentation  General Appearance:  Bizarre; Casual; Fairly Groomed  Eye Contact: Good  Speech: Clear and Coherent  Speech Volume: Normal  Handedness: Right   Mood and Affect  Mood: Euthymic  Affect: Non-Congruent   Thought Process  Thought Processes: Disorganized  Descriptions of Associations:Loose  Orientation:Partial  Thought Content:Illogical; Delusions  History of Schizophrenia/Schizoaffective disorder:Yes  Duration of Psychotic Symptoms:Greater than six months  Hallucinations:Hallucinations: Auditory Description of Auditory Hallucinations: the 8th time will make my illness go away like COVID-19; voice of patient from [place] of Reference:Delusions  Suicidal Thoughts:Suicidal Thoughts: No  Homicidal Thoughts:Homicidal Thoughts: No   Sensorium  Memory: Immediate Fair; Recent Fair  Judgment: Impaired  Insight: Shallow   Executive Functions  Concentration: Fair  Attention Span: Fair  Recall: [place] of Knowledge: Good  Language: Fair  Psychomotor Activity  Psychomotor Activity: Psychomotor Activity: Normal  Assets  Assets: Social Support; Resilience; Physical Health; Housing; Communication Skills; Financial Resources/Insurance  Sleep  Sleep: Sleep: Good  Physical Exam: Physical Exam Constitutional:      Appearance: Normal appearance.  HENT:     Head: Normocephalic.     Nose: No congestion or rhinorrhea.  Eyes:     Pupils: Pupils are equal, round, and reactive to light.  Pulmonary:     Effort: Pulmonary effort is normal.  Musculoskeletal:        General: Normal range of motion.      Cervical back: Normal range of motion.  Neurological:  Mental Status: He is alert and oriented to person, place, and time.    Review of Systems  Constitutional: Negative.   HENT: Negative.    Eyes: Negative.   Respiratory:  Negative for cough.   Cardiovascular:  Negative for chest pain.  Gastrointestinal: Negative.  Negative for heartburn.  Genitourinary: Negative.   Musculoskeletal: Negative.   Skin: Negative.   Neurological: Negative.   Psychiatric/Behavioral:  Positive for depression and hallucinations. Negative for memory loss, substance abuse and suicidal ideas. The patient is nervous/anxious and has insomnia.    Blood pressure 124/85, pulse (!) 144, temperature 98.2 F (36.8 C), temperature source Oral, resp. rate 18, height _0  (1.778 m), weight 97.1 kg, SpO2 100 %. Body mass index is 30.71 kg/m.  Treatment Plan Summary:  Daily contact with patient to assess and evaluate symptoms and progress in treatment and Medication management   Observation Level/Precautions:  15 minute checks  Laboratory:  Labs reviewed   Psychotherapy:  Unit Group sessions  Medications:  See [facility]  Consultations:  To be determined   Discharge Concerns:  Safety, medication compliance, mood stability  Estimated LOS: 5-7 days  Other:  N/A      PLAN Safety and Monitoring: Voluntary admission to inpatient psychiatric unit for safety, stabilization and treatment Daily contact with patient to assess and evaluate symptoms and progress in treatment Patient's case to be discussed in multi-disciplinary team meeting Observation Level : q15 minute checks Vital signs: q12 hours Precautions: Safety   Long Term Goal(s): Improvement in symptoms so as ready for discharge   Short Term Goals: Ability to identify changes in lifestyle to reduce recurrence of condition will improve, Ability to disclose and discuss suicidal ideas, Ability to demonstrate self-control will improve, Ability to identify and  develop effective coping behaviors will improve, Compliance with prescribed medications will improve, and Ability to identify triggers associated with substance abuse/mental health issues will improve   Diagnoses  Active Problems:   Schizoaffective disorder, bipolar type ([place])   Anxiety state   Insomnia   Paranoid schizophrenia ([place])   Medications:   Schizoaffective disorder, bipolar type ([place]):   -Stop -Invega 12 mg nightly for psychotic features -Start:   -Risperidone 2 mg BID for psychotic features; plan to  bridge over to [place] for further stability upon discharge.   -Monitor for any EPS, akathisia, TD symptoms Elevated HR:   -Plan to continue to monitor v/s; if continued elevation noted;  consider Propanolol 10 mg BID  -Continue Agitation protocol:  (Zyprexa, Ativan, Geodon PRN)-See MAR   Other PRNS - Tylenol 650 mg every 6 hours PRN for mild pain - Maalox 30 mg every 4 hrs PRN for indigestion - Milk of Magnesia as needed every 6 hrs for constipation - Trazodone 50 mg nightly PRN for insomnia - Hydroxyzine 25 mg TID PRN for anxiety   Discharge Planning: Social work and case management to assist with discharge planning and identification of hospital follow-up needs prior to discharge Estimated LOS: 5-7 days Discharge Concerns: Need to establish a safety plan; Medication compliance and effectiveness Discharge Goals: Return home with outpatient referrals for mental health follow-up including medication management/psychotherapy   I certify that inpatient services furnished can reasonably be expected to improve the patient's condition.   Inda Merlin, NP 09/27/2022, 12:42 PMPatient ID: Mario Thomas, male   DOB: 2001/01/10, 21 y.o.   MRN: [id number]

## 2022-09-27 NOTE — Group Note (Signed)
Recreation Therapy Group Note   Group Topic:Problem Solving  Group Date: 09/27/2022 Start Time: 1497 End Time: 1030 Facilitators: Nyemah Watton-McCall, LRT,CTRS Location: 500 Hall Dayroom   Goal Area(s) Addresses:  Patient will effectively work with peer towards shared goal.  Patient will identify factors that guided their decision making.  Patient will pro-socially communicate ideas during group session.    Group Description: Patients were given a scenario that they were going to be stranded on a deserted [place] for several months before being rescued. Writer tasked them with making a list of 15 things they would choose to bring with them for "survival". The list of items was prioritized most important to least. Each patient would come up with their own list, then work together to create a new list of 15 items while in a group of 3-5 peers. LRT discussed each person's list and how it differed from others. The debrief included discussion of priorities, good decisions versus bad decisions, and how it is important to think before acting so we can make the best decision possible. LRT tied the concept of effective communication among group members to patient's support systems outside of the hospital and its benefit post discharge.   Affect/Mood: Appropriate   Participation Level: Engaged   Participation Quality: Independent   Behavior: Appropriate and Disruptive   Speech/Thought Process: Delusional   Insight: Fair   Judgement: Fair    Modes of Intervention: Activity   Patient Response to Interventions:  Engaged   Education Outcome:  Acknowledges education and In group clarification offered    Clinical Observations/Individualized Feedback: Pt came into group being loud and had to be redirected.  Pt was appropriate for remainder of group.  Pt did ask some off the wall questions such as who is the secret service, who they work for and are they aliens.  Pt expressed [place].  Pt did pace around the dayroom for a short time while waiting on peers to complete their lists.     Plan: Continue to engage patient in RT group sessions 2-3x/week.   Cyrah Mclamb-McCall, LRT,CTRS  09/27/2022 11:55 AM

## 2022-09-28 DIAGNOSIS — F250 Schizoaffective disorder, bipolar type: Secondary | ICD-10-CM | POA: Diagnosis not present

## 2022-09-28 LAB — URINE CULTURE
Culture: NO GROWTH
Special Requests: NORMAL

## 2022-09-28 MED ORDER — RISPERIDONE 3 MG PO TBDP: 3.0000 mg | ORAL_TABLET | Freq: Two times a day (BID) | ORAL | Status: DC

## 2022-09-28 MED ORDER — BENZTROPINE MESYLATE 0.5 MG PO TABS
0.5000 mg | ORAL_TABLET | Freq: Two times a day (BID) | ORAL | Status: DC
  Administered 2022-09-28 – 2022-10-02 (×8): 0.5 mg via ORAL
  Filled 2022-09-28 (×13): qty 1

## 2022-09-28 MED ORDER — RISPERIDONE 3 MG PO TBDP
3.0000 mg | ORAL_TABLET | Freq: Two times a day (BID) | ORAL | Status: DC
  Administered 2022-09-28 – 2022-09-30 (×5): 3 mg via ORAL
  Filled 2022-09-28 (×11): qty 1

## 2022-09-28 NOTE — Progress Notes (Signed)
Pt observed with spontaneous pacing in milieu, restlessness and very fidgety. Continues to demand spiritual cleansing "I need purging form the exorcist, I need help now" with pressured speech, wide eye.  Required multiple verbal redirections to sit in dayroom during orientation group. Received PRN Vistaril 25 mg PO at 0815 without desired effect. Zyprexa 5 mg given for agitation and was effective when reassessed at 1130, pt was verbally redirectable at the time. Q 15 minutes safety checks maintained. Verbal education done on current treatment regimen and effects monitored. EKG done and place on chart. Emotional support, reassurance and encouragement  provided to pt. He remains compliant with  care. Tolerates all PO intake well. Off unit for meals with peers, returned without issues.

## 2022-09-28 NOTE — Group Note (Signed)
Recreation Therapy Group Note   Group Topic:Team Building  Group Date: 09/28/2022 Start Time: 1000 End Time: 9150 Facilitators: Domenick Quebedeaux-McCall, LRT,CTRS Location: 500 Hall Dayroom   Goal Area(s) Addresses:  Patient will effectively work with peer towards shared goal.  Patient will identify skills used to make activity successful.  Patient will identify how skills used during activity can be applied to reach post d/c goals.    Group Description: [employer]. In teams of 5-6, patients were given 11 craft pipe cleaners. Using the materials provided, patients were instructed to compete again the opposing team(s) to build the tallest free-standing structure from floor level. The activity was timed; difficulty increased by Probation officer as Pharmacist, hospital continued.  Systematically resources were removed with additional directions for example, placing one arm behind their back, working in silence, and shape stipulations. LRT facilitated post-activity discussion reviewing team processes and necessary communication skills involved in completion. Patients were encouraged to reflect how the skills utilized, or not utilized, in this activity can be incorporated to positively impact support systems post discharge.   Affect/Mood: Flat   Participation Level: Active   Participation Quality: Independent   Behavior: Appropriate and Restless   Speech/Thought Process: Relevant   Insight: Fair   Judgement: Fair    Modes of Intervention: STEM Activity   Patient Response to Interventions:  Engaged   Education Outcome:  Acknowledges education and In group clarification offered    Clinical Observations/Individualized Feedback: Pt was attentive and engaged during group session.  Pt took on the leader role of the group.  Pt became restless and began pacing in the dayroom before being called out of group to meet with provider.  Pt did not return.     Plan: Continue to engage  patient in RT group sessions 2-3x/week.   Marlisha Vanwyk-McCall, LRT,CTRS 09/28/2022 11:16 AM

## 2022-09-28 NOTE — Group Note (Signed)
Type of Therapy and Topic:  Group Therapy:  Stress Management   Participation Level:  Active    Description of Group:  Patients in this group were introduced to the idea of stress and encouraged to discuss negative and positive ways to manage stress. Patients discussed specific stressors that they have in their life right now and the physical signs and symptoms associated with that stress.  Patient encouraged to come up with positive changes to assist with the stress upon discharge in order to prevent future hospitalizations.   They also worked as a group on developing a specific plan for several patients to deal with stressors through [place], psychoeducation and self care techniques   Therapeutic Goals:               1)  To discuss the positive and negative impacts of stress             2)  identify signs and symptoms of stress             3)  generate ideas for stress management             4)  offer mutual support to others regarding stress management             5)  Developing plans for ways to manage specific stressors upon discharge               Summary of Patient Progress:  Patient was mostly appropriate in group.  He laughed at times without matching affect but was able to discuss group topic with peers. Patient had fair insight into group topic.    Therapeutic Modalities:   Motivational Interviewing Brief Solution-Focused Therapy   Margy Sumler, LCSW, [place] Social Worker  [facility]

## 2022-09-28 NOTE — Progress Notes (Addendum)
   09/27/22 2130  Psych Admission Type (Psych Patients Only)  Admission Status Voluntary  Psychosocial Assessment  Patient Complaints None  Eye Contact Fair  Facial Expression Animated;Fixed smile (Laughing)  Affect Preoccupied  Speech Logical/coherent  Interaction Forwards little  Motor Activity Pacing (Laughing, rolling on floor.)  Appearance/Hygiene Unremarkable  Behavior Characteristics Cooperative  Mood Pleasant;Preoccupied  Thought Process  Coherency Disorganized  Content Preoccupation  Delusions None reported or observed  Perception WDL  Hallucination None reported or observed  Judgment Poor  Confusion None  Danger to Self  Current suicidal ideation? Denies  Agreement Not to Harm Self Yes  Description of Agreement  (Verbal contract)  Danger to Others  Danger to Others None reported or observed   Patient is alert and oriented and presents an preoccupied and pleasant affect/mood. Patient denies SI, HI, AVH, and pain. PRN medications administered to patient, per provider orders. Patient pacing hall, laughing and rolling on floor. Patient was asked to refrain from being on the floor for safety and was cooperative with staff request. Patient contracts for safety and remains safe on the unit.

## 2022-09-28 NOTE — [facility] Suicide Risk Assessment (Signed)
[place] INPATIENT:  Family/Significant Other Suicide Prevention Education  Suicide Prevention Education:  Education Completed; [place], [phone]  (name of family member/significant other) has been identified by the patient as the family member/significant other with whom the patient will be residing, and identified as the person(s) who will aid the patient in the event of a mental health crisis (suicidal ideations/suicide attempt).  With written consent from the patient, the family member/significant other has been provided the following suicide prevention education, prior to the and/or following the discharge of the patient.  CSW spoke with mother who reports that she feels since last admission patient has gotten worse on medications.  Patient mother feels like an ACTT would be beneficial for patient but would like to try a trial period off meds.  CSW discussed doctors recommendations of being on medications and therapy and explained how an ACTT would assist with these recommendations as well as goals the patient may have.  Mother reports no additional safety concerns at this time.   The suicide prevention education provided includes the following: Suicide risk factors Suicide prevention and interventions National Suicide Hotline telephone number [facility] assessment telephone number [facility] Emergency Assistance [place] and/or Residential Mobile Crisis Unit telephone number  Request made of family/significant other to: Remove weapons (e.g., guns, rifles, knives), all items previously/currently identified as safety concern.   Remove drugs/medications (over-the-counter, prescriptions, illicit drugs), all items previously/currently identified as a safety concern.  The family member/significant other verbalizes understanding of the suicide prevention education information provided.  The family member/significant other agrees to remove the items of safety concern  listed above.  Evadean Sproule E Tobey Schmelzle 09/28/2022, 9:32 AM

## 2022-09-28 NOTE — [facility] Group Notes (Signed)
Pt did not attend wrap up group this evening. Pt was in their room resting.  

## 2022-09-28 NOTE — Progress Notes (Addendum)
[facility] MD Progress Note  09/28/2022 3:12 PM Rc Amison  MRN:  [id number] Principal Problem: Schizoaffective disorder, bipolar type (HCC) Diagnosis: Principal Problem:   Schizoaffective disorder, bipolar type (HCC) Active Problems:   Anxiety state   Insomnia  History of Present Illness: Mario Thomas is a 21 yo African American male who walked into this [facility] on 09/24/22 with complaints of suicidal and homicidal ideations. Pt was admitted voluntarily for treatment and stabilization of his mood.  Pt with prior hospitalizations at this [facility] from 11/09/21 through 11/17/2021 for manic type behaviors, and from 06/11/22 through 06/20/22 for bizarre behaviors and making threats to his parents.    24 hr chart review: BP slightly elevated earlier today morning at 140/86.  Heart rate was also slightly elevated at 114.  Patient remains compliant with all of his scheduled medications.  He required hydroxyzine 25 mg earlier today morning for anxiety and required trazodone 50 mg last night for insomnia.  Patient slept a total of 8.25 hours last night as per nursing flow sheets.  Over the past 24 hours patient has been noted to be pacing, laughing inappropriately, rolling on the floors, and being disorganized.  He is attending unit group sessions but is noted to be pacing and very anxious in groups.  Today's patient assessment note: During today's encounter with patient, patient is extremely anxious and pacing back and forth during the entire assessment.  Patient presents with + SI, + paranoia, and + delusions of persecution.  Patient was asked what is causing his restlessness and anxiety and answered: "I have to lie to get out of here.  My parents made me feel guilty for the money they spent by coming to the hospital.  The truth is I am not feeling okay.  I just want to go home.  I feel like something bad is going to happen.  I am going to die or get in an accident."  Upon further questioning, patient reported that he was  having suicidal ideations with a plan to stab himself and stated that he felt as though he was being punished for things that he has done in his past, and stated that he acts God for forgiveness already.  Patient verbally contracted for safety on the unit, and stated that he would not do anything to hurt himself or harm himself while here at the hospital.  When asked about auditory and visual hallucinations, he stated: "It is difficult for me to tell the truth, but the truth is that I am not okay." Pt again asked if he was hearing voices or seeing things that no one else could hear or see, and stated: "I don't know". He responded with "I don't know" with other questions assessing first rank symptoms. Pt is not forthcoming, with all of his symptoms as he verbalized being fearful that this will keep him hospitalized for longer, and make his parents angry towards him. Attending Psychiatrist will call mother today or tomorrow to educate her about pt's current mental status as she has not been very receptive to Clinical research associate and CSW educating her about pt's mental illness and what it entails.  Invega discontinued yesterday due to lack of efficacy, and pt started in Risperdal. We are increasing Risperdal to 3 mg BID for management of psychosis. We are continuing other medications as below. He denies being in any physical distress, and denies any medication related side effects.  No TD/EPS type symptoms found on assessment, and pt denies any feelings of stiffness.  AIMS: 0. Patient reports that his sleep quality last night was good and reports a fair appetite.    Total Time spent with patient: 30 minutes  Past Psychiatric History: psychosis  Past Medical History:  Past Medical History:  Diagnosis Date   Heart murmur    MDD (major depressive disorder), severe ([place]) 08/23/2018   History reviewed. No pertinent surgical history. Family History:  Family History  Problem Relation Age of Onset   Hyperlipidemia Father     Family Psychiatric  History: non reported Social History:  Social History   Substance and Sexual Activity  Alcohol Use Not Currently     Social History   Substance and Sexual Activity  Drug Use Never   Comment: Denies    Social History   Socioeconomic History   Marital status: Single    Spouse name: Not on file   Number of children: Not on file   Years of education: Not on file   Highest education level: Not on file  Occupational History   Not on file  Tobacco Use   Smoking status: Never   Smokeless tobacco: Never  Vaping Use   Vaping Use: Never used  Substance and Sexual Activity   Alcohol use: Not Currently   Drug use: Never    Comment: Denies   Sexual activity: Never  Other Topics Concern   Not on file  Social History Narrative   01/23/21   From: the area   Living: with parents   Work: Primary school teacher: Estate manager/land agent at [employer]: good relationship with parents, 1 sibling      Enjoys: running      Exercise: daily running    Diet: meat, veggies, grains      Safety   Seat belts: Yes    Guns: No   Safe in relationships: Yes    Social Determinants of Radio broadcast assistant Strain: Not on file  Food Insecurity: No Food Insecurity (09/24/2022)   Hunger Vital Sign    Worried About Running Out of Food in the Last Year: Never true    [place] in the Last Year: Never true  Transportation Needs: No Transportation Needs (09/24/2022)   PRAPARE - Hydrologist (Medical): No    Lack of Transportation (Non-Medical): No  Physical Activity: Not on file  Stress: Not on file  Social Connections: Not on file   Additional Social History:     Sleep: Good  Appetite:  Good  Current Medications: Current Facility-Administered Medications  Medication Dose Route Frequency Provider Last Rate Last Admin   acetaminophen (TYLENOL) tablet 650 mg  650 mg Oral Q6H PRN Ntuen, Kris Hartmann, FNP   650 mg at 09/27/22 0754    alum & mag hydroxide-simeth (MAALOX/MYLANTA) 200-200-20 MG/5ML suspension 30 mL  30 mL Oral Q4H PRN Ntuen, Kris Hartmann, FNP       hydrOXYzine (ATARAX) tablet 25 mg  25 mg Oral TID PRN Laretta Bolster, FNP   25 mg at 09/28/22 0815   OLANZapine zydis (ZYPREXA) disintegrating tablet 5 mg  5 mg Oral Q8H PRN Nicholes Rough, NP   5 mg at 09/28/22 1033   And   LORazepam (ATIVAN) tablet 1 mg  1 mg Oral PRN Nicholes Rough, NP       And   ziprasidone (GEODON) injection 20 mg  20 mg Intramuscular PRN Nicholes Rough, NP       magnesium  hydroxide (MILK OF MAGNESIA) suspension 30 mL  30 mL Oral Daily PRN Ntuen, Jesusita Oka, FNP       risperiDONE (RISPERDAL M-TABS) disintegrating tablet 3 mg  3 mg Oral BID Starleen Blue, NP   3 mg at 09/28/22 1156   traZODone (DESYREL) tablet 50 mg  50 mg Oral QHS PRN Cecilie Lowers, FNP   50 mg at 09/27/22 2046    Lab Results:  Results for orders placed or performed during the hospital encounter of 09/24/22 (from the past 48 hour(s))  Urinalysis, Complete w Microscopic Urine, Clean Catch     Status: Abnormal   Collection Time: 09/27/22  2:22 PM  Result Value Ref Range   Color, Urine STRAW (A) YELLOW   APPearance CLEAR CLEAR   Specific Gravity, Urine 1.013 1.005 - 1.030   pH 6.0 5.0 - 8.0   Glucose, UA NEGATIVE NEGATIVE mg/dL   Hgb urine dipstick NEGATIVE NEGATIVE   Bilirubin Urine NEGATIVE NEGATIVE   Ketones, ur NEGATIVE NEGATIVE mg/dL   Protein, ur NEGATIVE NEGATIVE mg/dL   Nitrite NEGATIVE NEGATIVE   Leukocytes,Ua NEGATIVE NEGATIVE   RBC / HPF 0-5 0 - 5 RBC/hpf   WBC, UA 0-5 0 - 5 WBC/hpf   Bacteria, UA NONE SEEN NONE SEEN   Squamous Epithelial / LPF 0-5 0 - 5    Comment: Performed at [facility], [address] [place], [place] [address]    Blood Alcohol level:  Lab Results  Component Value Date   ETH <10 09/09/2022   ETH <10 08/18/2022    Metabolic Disorder Labs: Lab Results  Component Value Date   HGBA1C 4.8 09/25/2022   MPG 91.06  09/25/2022   MPG 93.93 05/28/2022   Lab Results  Component Value Date   PROLACTIN 10.0 08/24/2018   Lab Results  Component Value Date   CHOL 122 09/25/2022   TRIG 27 09/25/2022   HDL 42 09/25/2022   CHOLHDL 2.9 09/25/2022   VLDL 5 09/25/2022   LDLCALC 75 09/25/2022   LDLCALC 41 05/28/2022    Physical Findings: AIMS:  , ,  ,  ,    CIWA:    COWS:     Musculoskeletal: Strength & Muscle Tone: within normal limits Gait & Station: normal Patient leans: N/A  Psychiatric Specialty Exam:  Presentation  General Appearance:  Appropriate for Environment; Fairly Groomed  Eye Contact: Good  Speech: Clear and Coherent  Speech Volume: Normal  Handedness: Right   Mood and Affect  Mood: Depressed; Anxious  Affect: Congruent   Thought Process  Thought Processes: Coherent  Descriptions of Associations:Intact  Orientation:Full (Time, Place and Person)  Thought Content:Illogical  History of Schizophrenia/Schizoaffective disorder:Yes  Duration of Psychotic Symptoms:Greater than six months  Hallucinations:Hallucinations: Auditory; Visual Description of Auditory Hallucinations: the 8th time will make my illness go away like COVID-19; voice of patient from [facility]  Ideas of Reference:Paranoia; Delusions  Suicidal Thoughts:Suicidal Thoughts: Yes, Active SI Active Intent and/or Plan: With Plan; Without Intent  Homicidal Thoughts:Homicidal Thoughts: No   Sensorium  Memory: Immediate Good  Judgment: Poor  Insight: Poor   Executive Functions  Concentration: Poor  Attention Span: Fair  Recall: [employer] of Knowledge: Fair  Language: Fair  Psychomotor Activity  Psychomotor Activity: Psychomotor Activity: Normal  Assets  Assets: Communication Skills; Social Support  Sleep  Sleep: Sleep: Fair  Physical Exam: Physical Exam Constitutional:      Appearance: Normal appearance.  HENT:     Head: Normocephalic.  Nose: No  congestion or rhinorrhea.  Eyes:     Pupils: Pupils are equal, round, and reactive to light.  Pulmonary:     Effort: Pulmonary effort is normal.  Musculoskeletal:        General: Normal range of motion.     Cervical back: Normal range of motion.  Neurological:     Mental Status: He is alert and oriented to person, place, and time.    Review of Systems  Constitutional: Negative.   HENT: Negative.    Eyes: Negative.   Respiratory:  Negative for cough.   Cardiovascular:  Negative for chest pain.  Gastrointestinal: Negative.  Negative for heartburn.  Genitourinary: Negative.   Musculoskeletal: Negative.   Skin: Negative.   Neurological: Negative.   Psychiatric/Behavioral:  Positive for depression and hallucinations. Negative for memory loss, substance abuse and suicidal ideas. The patient is nervous/anxious and has insomnia.    Blood pressure (!) 140/86, pulse (!) 114, temperature 98.1 F (36.7 C), temperature source Oral, resp. rate 18, height 5\' 10"  (1.778 m), weight 97.1 kg, SpO2 100 %. Body mass index is 30.71 kg/m.  Treatment Plan Summary:  Daily contact with patient to assess and evaluate symptoms and progress in treatment and Medication management   Observation Level/Precautions:  15 minute checks  Laboratory:  Labs reviewed   Psychotherapy:  Unit Group sessions  Medications:  See [facility]  Consultations:  To be determined   Discharge Concerns:  Safety, medication compliance, mood stability  Estimated LOS: 5-7 days  Other:  N/A      PLAN Safety and Monitoring: Voluntary admission to inpatient psychiatric unit for safety, stabilization and treatment Daily contact with patient to assess and evaluate symptoms and progress in treatment Patient's case to be discussed in multi-disciplinary team meeting Observation Level : q15 minute checks Vital signs: q12 hours Precautions: Safety   Long Term Goal(s): Improvement in symptoms so as ready for discharge   Short Term Goals:  Ability to identify changes in lifestyle to reduce recurrence of condition will improve, Ability to disclose and discuss suicidal ideas, Ability to demonstrate self-control will improve, Ability to identify and develop effective coping behaviors will improve, Compliance with prescribed medications will improve, and Ability to identify triggers associated with substance abuse/mental health issues will improve   Diagnoses  Active Problems:   Anxiety state   Insomnia   Paranoid schizophrenia (HCC)   Medications -Discontinued Invega 12 mg nightly for psychosis on 10/19 d/t ineffectiveness -Increase Risperdal to 3 mg BID for psychosis (Educated on rationales, benefits & possible side effects of medication, including the risk of metabolic syndrome and prolonged Qtc. Verbalized understanding, but will need reinforcement of education once psychosis clears. Repeat EKG ordered for Qtc tracing) -Continue Trazodone 50 mg nightly PRN for insomnia -Continue Hydroxyzine 25 mg TID PRN for anxiety -Continue Agitation protocol (Zyprexa, Ativan, Geodon PRN)-See MAR   Other PRNS -Continue Tylenol 650 mg every 6 hours PRN for mild pain -Continue Maalox 30 mg every 4 hrs PRN for indigestion -Continue Milk of Magnesia as needed every 6 hrs for constipation   Discharge Planning: Social work and case management to assist with discharge planning and identification of hospital follow-up needs prior to discharge Estimated LOS: 5-7 days Discharge Concerns: Need to establish a safety plan; Medication compliance and effectiveness Discharge Goals: Return home with outpatient referrals for mental health follow-up including medication management/psychotherapy   I certify that inpatient services furnished can reasonably be expected to improve the patient's condition.   Kathline Banbury  Baylea Milburn, NP 09/28/2022, 3:12 PMPatient ID: Mario JoyIfaa Thomas, male   DOB: 10-08-2001, 21 y.o.   MRN: [id number]

## 2022-09-28 NOTE — [facility] Group Notes (Signed)
Spirituality group facilitated by Kathrynn Humble, [place].  Group Description: Group focused on topic of hope. Patients participated in facilitated discussion around topic, connecting with one another around experiences and definitions for hope. Group members engaged with visual explorer photos, reflecting on what hope looks like for them today. Group engaged in discussion around how their definitions of hope are present today in hospital.  Modalities: Psycho-social ed, Adlerian, Narrative, MI  Patient Progress: Mario Thomas attended group and was the only patient present.  He shared that his family gives him hope, particularly his parents.  He calls them whenever he needs support and he feels better after talking with them.  [address], [place] Pager, [phone]

## 2022-09-29 DIAGNOSIS — F250 Schizoaffective disorder, bipolar type: Secondary | ICD-10-CM | POA: Diagnosis not present

## 2022-09-29 NOTE — Group Note (Signed)
  [facility] LCSW Group Therapy Note  Date/Time:  09/29/2022 11:15AM-12:00PM  Type of Therapy and Topic:  Group Therapy:  Feelings About Hospitalization  Participation Level:  None   Description of Group This process group involved patients discussing their feelings related to being hospitalized, as well as the benefits they see to being in the hospital.  These feelings and benefits were itemized.  The group then brainstormed specific ways in which they could seek those same benefits when they discharge and return home.  Therapeutic Goals Patient will identify and describe positive and negative feelings related to hospitalization Patient will verbalize benefits of hospitalization to themselves personally Patients will brainstorm together ways they can obtain similar benefits in the outpatient setting, identify barriers to wellness and possible solutions  Summary of Patient Progress:  The patient expressed his  primary feelings about being hospitalized are that he needs to be here, adding that he always feels better when he leaves.  He stated, nonetheless, that he has come back 8 times.  He then left the room.  He had asked if we could talk about God Almighty, and when CSW declined, he said he did not want to stay if we were not going to talk about religion.  Initially he did stay a few minutes, long enough to share the above, but then left and paced the hallway.  Therapeutic Modalities Cognitive Behavioral Therapy Motivational Interviewing    Selmer Dominion, LCSW 09/29/2022, 3:21 PM

## 2022-09-29 NOTE — Progress Notes (Signed)
   09/29/22 1955  Psych Admission Type (Psych Patients Only)  Admission Status Voluntary  Psychosocial Assessment  Patient Complaints None  Eye Contact Fair  Facial Expression Flat  Affect Appropriate to circumstance  Speech Logical/coherent  Interaction Assertive;Minimal  Motor Activity Restless  Appearance/Hygiene Unremarkable  Behavior Characteristics Cooperative  Mood Pleasant  Thought Process  Coherency Disorganized  Content Preoccupation  Delusions None reported or observed  Perception WDL  Hallucination None reported or observed  Judgment Poor  Confusion None  Danger to Self  Current suicidal ideation? Denies  Danger to Others  Danger to Others None reported or observed

## 2022-09-29 NOTE — [facility] Group Notes (Signed)
Pt. Attended this group and participated.

## 2022-09-29 NOTE — [facility] Group Notes (Signed)
Pt. Did attend the [place] group. This pt. Gold is to go home to day.

## 2022-09-29 NOTE — Progress Notes (Signed)
[facility] MD Progress Note  09/29/2022 3:37 PM Tel Hevia  MRN:  [id number] Principal Problem: Schizoaffective disorder, bipolar type (HCC) Diagnosis: Principal Problem:   Schizoaffective disorder, bipolar type (HCC) Active Problems:   Anxiety state   Insomnia  History of Present Illness: Daniil Picone is a 21 yo African American male who walked into this [facility] on 09/24/22 with complaints of suicidal and homicidal ideations. Pt was admitted voluntarily for treatment and stabilization of his mood.  Pt with prior hospitalizations at this [facility] from 11/09/21 through 11/17/2021 for manic type behaviors, and from 06/11/22 through 06/20/22 for bizarre behaviors and making threats to his parents.    24 hr chart review: BP slightly elevated earlier today morning at 142/95.  Heart rate was also elevated at 108, then 133. Pt has been asymptomatic. Patient remains compliant with all of his scheduled medications.  He required hydroxyzine 25 mg last night for anxiety and required trazodone 50 mg last night for insomnia.  He required Zyprexa 5 mg earlier today morning for agitation.  Over the past 24 hours, patient has continued to be noted to be pacing, laughing inappropriately, and being disorganized and continued to present with psychosis.  He is attending unit group sessions but is continuing to be noted to be pacing and very anxious in groups, and also has some attention seeking behaviors as per staff.  Today's patient assessment note: During today's encounter with patient, patient is less anxious than yesterday, but continues to present with psychosis. Patient presents with + SI, + paranoia, and + delusions of persecution.  Patient denies having a plan or intent to self harm while here at the hospital, and is able to verbally contract for safety at this hospital.  Patient denies homicidal ideations, and continues to present with auditory hallucinations of voices stating "hey."  Patient states that the voices are too, but  he is not able to make out who the voices belong to.  He reports visual hallucinations of Beckie Salts.  And presents with paranoia and delusions of persecution, stating that he is being punished by God.   Hinda Glatter previously discontinued due to lack of efficacy, and pt started in Risperdal.  He was started on Risperdal for psychosis and is currently on 3 mg twice daily.  Dose was increased yesterday, so we will continue 3 mg twice daily for now.  We are continuing other medications as below. He denies being in any physical distress, and denies any medication related side effects.  No TD/EPS type symptoms found on assessment, and pt denies any feelings of stiffness. AIMS: 0. Patient reports that his sleep quality last night was good and reports a good appetite.    Cardiology called, spoke with Dr. Darryl Nestle who reviewed patient's EKGs completed on the unit so far.  As per Dr. Darryl Nestle, most recent EKG is similar to previous EKGs, and as long as patient is asymptomatic with no chest pain or shortness of breath no dizziness etc, there are no interventions necessary at this time. Nursing staff has been asked to recheck vitals.  Total Time spent with patient: 30 minutes  Past Psychiatric History: psychosis  Past Medical History:  Past Medical History:  Diagnosis Date   Heart murmur    MDD (major depressive disorder), severe (HCC) 08/23/2018   History reviewed. No pertinent surgical history. Family History:  Family History  Problem Relation Age of Onset   Hyperlipidemia Father    Family Psychiatric  History: non reported Social History:  Social  History   Substance and Sexual Activity  Alcohol Use Not Currently     Social History   Substance and Sexual Activity  Drug Use Never   Comment: Denies    Social History   Socioeconomic History   Marital status: Single    Spouse name: Not on file   Number of children: Not on file   Years of education: Not on file   Highest education level:  Not on file  Occupational History   Not on file  Tobacco Use   Smoking status: Never   Smokeless tobacco: Never  Vaping Use   Vaping Use: Never used  Substance and Sexual Activity   Alcohol use: Not Currently   Drug use: Never    Comment: Denies   Sexual activity: Never  Other Topics Concern   Not on file  Social History Narrative   01/23/21   From: the area   Living: with parents   Work: Primary school teacher: Estate manager/land agent at [employer]: good relationship with parents, 1 sibling      Enjoys: running      Exercise: daily running    Diet: meat, veggies, grains      Safety   Seat belts: Yes    Guns: No   Safe in relationships: Yes    Social Determinants of Radio broadcast assistant Strain: Not on file  Food Insecurity: No Food Insecurity (09/24/2022)   Hunger Vital Sign    Worried About Running Out of Food in the Last Year: Never true    [place] in the Last Year: Never true  Transportation Needs: No Transportation Needs (09/24/2022)   PRAPARE - Hydrologist (Medical): No    Lack of Transportation (Non-Medical): No  Physical Activity: Not on file  Stress: Not on file  Social Connections: Not on file   Additional Social History:     Sleep: Good  Appetite:  Good  Current Medications: Current Facility-Administered Medications  Medication Dose Route Frequency Provider Last Rate Last Admin   acetaminophen (TYLENOL) tablet 650 mg  650 mg Oral Q6H PRN Laretta Bolster, FNP   650 mg at 09/27/22 0754   alum & mag hydroxide-simeth (MAALOX/MYLANTA) 200-200-20 MG/5ML suspension 30 mL  30 mL Oral Q4H PRN Ntuen, Kris Hartmann, FNP       benztropine (COGENTIN) tablet 0.5 mg  0.5 mg Oral BID Nicholes Rough, NP   0.5 mg at 09/29/22 7782   hydrOXYzine (ATARAX) tablet 25 mg  25 mg Oral TID PRN Laretta Bolster, FNP   25 mg at 09/28/22 2037   OLANZapine zydis (ZYPREXA) disintegrating tablet 5 mg  5 mg Oral Q8H PRN Nicholes Rough, NP   5 mg  at 09/28/22 1033   And   LORazepam (ATIVAN) tablet 1 mg  1 mg Oral PRN Nicholes Rough, NP       And   ziprasidone (GEODON) injection 20 mg  20 mg Intramuscular PRN Jeniah Kishi, NP       magnesium hydroxide (MILK OF MAGNESIA) suspension 30 mL  30 mL Oral Daily PRN Ntuen, Kris Hartmann, FNP       risperiDONE (RISPERDAL M-TABS) disintegrating tablet 3 mg  3 mg Oral BID Nicholes Rough, NP   3 mg at 09/29/22 0823   traZODone (DESYREL) tablet 50 mg  50 mg Oral QHS PRN Laretta Bolster, FNP   50 mg at 09/28/22 2037  Lab Results:  No results found for this or any previous visit (from the past 48 hour(s)). Blood Alcohol level:  Lab Results  Component Value Date   ETH <10 09/09/2022   ETH <10 08/18/2022    Metabolic Disorder Labs: Lab Results  Component Value Date   HGBA1C 4.8 09/25/2022   MPG 91.06 09/25/2022   MPG 93.93 05/28/2022   Lab Results  Component Value Date   PROLACTIN 10.0 08/24/2018   Lab Results  Component Value Date   CHOL 122 09/25/2022   TRIG 27 09/25/2022   HDL 42 09/25/2022   CHOLHDL 2.9 09/25/2022   VLDL 5 09/25/2022   LDLCALC 75 09/25/2022   LDLCALC 41 05/28/2022   Physical Findings: AIMS: 0 CIWA: n/a   COWS: n/a  Musculoskeletal: Strength & Muscle Tone: within normal limits Gait & Station: normal Patient leans: N/A  Psychiatric Specialty Exam:  Presentation  General Appearance:  Appropriate for Environment; Fairly Groomed  Eye Contact: Good  Speech: Clear and Coherent  Speech Volume: Normal  Handedness: Right  Mood and Affect  Mood: Anxious  Affect: Congruent  Thought Process  Thought Processes: Coherent  Descriptions of Associations:Intact  Orientation:Partial  Thought Content:Illogical  History of Schizophrenia/Schizoaffective disorder:Yes  Duration of Psychotic Symptoms:Greater than six months  Hallucinations:Hallucinations: Auditory; Visual  Ideas of Reference:Paranoia; Delusions; Percusatory  Suicidal  Thoughts:Suicidal Thoughts: No SI Active Intent and/or Plan: With Plan; Without Intent  Homicidal Thoughts:Homicidal Thoughts: No  Sensorium  Memory: Immediate Good  Judgment: Poor  Insight: Poor  Executive Functions  Concentration: Poor  Attention Span: Poor  Recall: [employer] of Knowledge: Fair  Language: Fair  Psychomotor Activity  Psychomotor Activity: Psychomotor Activity: Normal  Assets  Assets: Housing; Social Support  Sleep  Sleep: Sleep: Good  Physical Exam: Physical Exam Constitutional:      Appearance: Normal appearance.  HENT:     Head: Normocephalic.     Nose: No congestion or rhinorrhea.  Eyes:     Pupils: Pupils are equal, round, and reactive to light.  Pulmonary:     Effort: Pulmonary effort is normal.  Musculoskeletal:        General: Normal range of motion.     Cervical back: Normal range of motion.  Neurological:     Mental Status: He is alert and oriented to person, place, and time.    Review of Systems  Constitutional: Negative.   HENT: Negative.    Eyes: Negative.   Respiratory:  Negative for cough.   Cardiovascular:  Negative for chest pain.  Gastrointestinal: Negative.  Negative for heartburn.  Genitourinary: Negative.   Musculoskeletal: Negative.   Skin: Negative.   Neurological: Negative.   Psychiatric/Behavioral:  Positive for depression and hallucinations. Negative for memory loss, substance abuse and suicidal ideas. The patient is nervous/anxious and has insomnia.    Blood pressure 113/72, pulse (!) 133, temperature 98 F (36.7 C), temperature source Oral, resp. rate 16, height 5\' 10"  (1.778 m), weight 97.1 kg, SpO2 99 %. Body mass index is 30.71 kg/m.  Treatment Plan Summary: Daily contact with patient to assess and evaluate symptoms and progress in treatment and Medication management   Observation Level/Precautions:  15 minute checks  Laboratory:  Labs reviewed   Psychotherapy:  Unit Group sessions   Medications:  See [facility]  Consultations:  To be determined   Discharge Concerns:  Safety, medication compliance, mood stability  Estimated LOS: 5-7 days  Other:  N/A     PLAN Safety and Monitoring: Voluntary admission  to inpatient psychiatric unit for safety, stabilization and treatment Daily contact with patient to assess and evaluate symptoms and progress in treatment Patient's case to be discussed in multi-disciplinary team meeting Observation Level : q15 minute checks Vital signs: q12 hours Precautions: Safety   Long Term Goal(s): Improvement in symptoms so as ready for discharge   Short Term Goals: Ability to identify changes in lifestyle to reduce recurrence of condition will improve, Ability to disclose and discuss suicidal ideas, Ability to demonstrate self-control will improve, Ability to identify and develop effective coping behaviors will improve, Compliance with prescribed medications will improve, and Ability to identify triggers associated with substance abuse/mental health issues will improve   Diagnoses  Active Problems:   Anxiety state   Insomnia   Paranoid schizophrenia (HCC)   Medications -Previously discontinued Invega 12 mg nightly for psychosis on 10/19 d/t ineffectiveness -Continue Risperdal 3 mg BID for psychosis (Educated on rationales, benefits & possible side effects of medication, including the risk of metabolic syndrome and prolonged Qtc. Verbalized understanding, but will need reinforcement of education once psychosis clears. Repeat EKG ordered for Qtc tracing & no changes from previous EKGs as per Cardiology) -Continue Trazodone 50 mg nightly PRN for insomnia -Continue Hydroxyzine 25 mg TID PRN for anxiety -Continue Agitation protocol (Zyprexa, Ativan, Geodon PRN)-See MAR   Other PRNS -Continue Tylenol 650 mg every 6 hours PRN for mild pain -Continue Maalox 30 mg every 4 hrs PRN for indigestion -Continue Milk of Magnesia as needed every 6 hrs for  constipation   Discharge Planning: Social work and case management to assist with discharge planning and identification of hospital follow-up needs prior to discharge Estimated LOS: 5-7 days Discharge Concerns: Need to establish a safety plan; Medication compliance and effectiveness Discharge Goals: Return home with outpatient referrals for mental health follow-up including medication management/psychotherapy   I certify that inpatient services furnished can reasonably be expected to improve the patient's condition.   Starleen Blue, NP 09/29/2022, 3:37 PMPatient ID: Romilda Joy, male   DOB: 04/03/2001, 21 y.o.   MRN: [id number] Patient ID: Steele Stracener, male   DOB: 06-Apr-2001, 21 y.o.   MRN: [id number]

## 2022-09-30 DIAGNOSIS — F250 Schizoaffective disorder, bipolar type: Secondary | ICD-10-CM | POA: Diagnosis not present

## 2022-09-30 MED ORDER — TRAZODONE HCL 50 MG PO TABS
50.0000 mg | ORAL_TABLET | Freq: Every day | ORAL | Status: DC
  Administered 2022-09-30 – 2022-10-04 (×5): 50 mg via ORAL
  Filled 2022-09-30 (×9): qty 1

## 2022-09-30 MED ORDER — RISPERIDONE 2 MG PO TBDP
4.0000 mg | ORAL_TABLET | Freq: Two times a day (BID) | ORAL | Status: DC
  Administered 2022-09-30 – 2022-10-05 (×10): 4 mg via ORAL
  Filled 2022-09-30 (×16): qty 2

## 2022-09-30 NOTE — Progress Notes (Signed)
Adult Psychoeducational Group Note  Date:  09/30/2022 Time:  8:18 PM  Group Topic/Focus:  Wrap-Up Group:   The focus of this group is to help patients review their daily goal of treatment and discuss progress on daily workbooks.  Participation Level:  Active  Participation Quality:  Appropriate  Affect:  Appropriate  Cognitive:  Appropriate  Insight: Appropriate  Engagement in Group:  Engaged  Modes of Intervention:  Discussion  Additional Comments:   Pt states he had a good day today. Pts dad came to visit and pt endorsed having a good conversation with him during visitation. Pt states that his goal was to stay on his medication and continue with outpatient psychiatrist.   Gerhard Perches 09/30/2022, 8:18 PM

## 2022-09-30 NOTE — Progress Notes (Signed)
Iffa rates sleep as "Good". Pt denies SI/HI/AVH. Pt was observed continuously pacing the hall. Pt was laughing inappropriately on approach. Pt was anxious on approach. PRN vistaril offered and accepted. BP has been moderately elevated throughout this admit. Will recheck BP. Pt remains safe.

## 2022-09-30 NOTE — Group Note (Signed)
LCSW Group Therapy Note  09/30/2022      Type of Therapy and Topic:  Group Therapy: Gratitude  Participation Level:  Active   Description of Group:   In this group, patients shared and discussed the importance of acknowledging the elements in their lives for which they are grateful and how this can positively impact their mood.  The group discussed how bringing the positive elements of their lives to the forefront of their minds can help with recovery from any illness, physical or mental.  An exercise was done as a group in which a list was made of gratitude items in order to encourage participants to consider other potential positives in their lives.  Therapeutic Goals: Patients will identify one or more item for which they are grateful in each of 6 categories:  people, experiences, things, places, skills, and other. Patients will discuss how it is possible to seek out gratitude in even bad situations. Patients will explore other possible items of gratitude that they could remember.   Summary of Patient Progress:  The patient shared that he is grateful for his mother and father.  He participated well and was calm throughout group.  Patient's reaction to the group was to be "more relieved" at the end.  Therapeutic Modalities:   Solution-Focused Therapy Activity  Berlin Hun Grossman-Orr, LCSW .

## 2022-09-30 NOTE — Progress Notes (Signed)
Patient attended group tonight and was in the dayroom briefly before receiving his 2000 medication. He was compliant with his medications tonight and reports having had a good day. His father visited on tonight.    09/30/22 2128  Psych Admission Type (Psych Patients Only)  Admission Status Voluntary  Psychosocial Assessment  Patient Complaints None  Eye Contact Fair  Facial Expression Flat  Affect Appropriate to circumstance  Speech Logical/coherent  Interaction Assertive;Minimal  Motor Activity Restless  Appearance/Hygiene Unremarkable  Behavior Characteristics Cooperative;Appropriate to situation  Mood Preoccupied;Pleasant  Thought Process  Coherency Disorganized  Content Preoccupation  Delusions None reported or observed  Perception WDL  Hallucination None reported or observed  Judgment Poor  Confusion None  Danger to Self  Current suicidal ideation? Denies  Danger to Others  Danger to Others None reported or observed

## 2022-09-30 NOTE — Progress Notes (Signed)
[facility] MD Progress Note  09/30/2022 3:18 PM Mario Thomas  MRN:  [id number] Principal Problem: Schizoaffective disorder, bipolar type (HCC) Diagnosis: Principal Problem:   Schizoaffective disorder, bipolar type (HCC) Active Problems:   Anxiety state   Insomnia  History of Present Illness: Mario Thomas is a 21 yo African American male who walked into this [facility] on 09/24/22 with complaints of suicidal and homicidal ideations. Pt was admitted voluntarily for treatment and stabilization of his mood.  Pt with prior hospitalizations at this [facility] from 11/09/21 through 11/17/2021 for manic type behaviors, and from 06/11/22 through 06/20/22 for bizarre behaviors and making threats to his parents.    24 hr chart review: HR elevated at 123 earlier today morning and BP was 140/83. Nursing asked to recheck vitals. Pt is continuing to be compliant with his scheduled medications, and only PRN medication given in the past 24 hrs is Hydroxyzine 25 mg earlier today morning for anxiety. Pt has attended some unit group activities in the past 24 hrs.    Today's patient assessment note: Today, pt presents with religious preoccupation, reports being afraid , but states that he has Psalm 64 of the bible which protects him. He talks about evil and God, that about Jesus being "the truth, the light and the way". States God knows what he is thinking, states that he feels the holy spirit in him and is no longer afraid to die, and is no longer afraid that he will kill someone else. Pacing and when asked why he is pacing states "I am trying to be free from bondage, and from the sins that I committed. Jesus died on the cross for me, for you, and every one else." Thoughts are disorganized & he presents with flight of ideas. He states that his mother brought him a bible yesterday, which might be a potentiation of his current thought process (religious preoccupation). When asked about auditory or visual hallucinations, pt is hesitant prior to  responding that he is not hearing any voices or seeing things. He denies SI, denies HI.   Pt seems not to be forthcoming about his current thoughts, as he has mentioned in prior encounters that he will lie in order to be discharged. He is oriented to person, place, time and situation, and is able to state current president and list the three prior presidents of the [place] in order. He reports a good appetite and reports a good sleep quality last night. He denies any physical discomfort, denies any issues with his GI system.  We are increasing Risperdal to 4 mg BID for management of psychosis. Changing Trazodone from PRN to scheduled. We are continuing other medications as listed below. No TD/EPS type symptoms found on assessment, and pt denies any feelings of stiffness. AIMS: 0.   Total Time spent with patient: 30 minutes  Past Psychiatric History: psychosis  Past Medical History:  Past Medical History:  Diagnosis Date   Heart murmur    MDD (major depressive disorder), severe (HCC) 08/23/2018   History reviewed. No pertinent surgical history. Family History:  Family History  Problem Relation Age of Onset   Hyperlipidemia Father    Family Psychiatric  History: non reported Social History:  Social History   Substance and Sexual Activity  Alcohol Use Not Currently     Social History   Substance and Sexual Activity  Drug Use Never   Comment: Denies    Social History   Socioeconomic History   Marital status: Single  Spouse name: Not on file   Number of children: Not on file   Years of education: Not on file   Highest education level: Not on file  Occupational History   Not on file  Tobacco Use   Smoking status: Never   Smokeless tobacco: Never  Vaping Use   Vaping Use: Never used  Substance and Sexual Activity   Alcohol use: Not Currently   Drug use: Never    Comment: Denies   Sexual activity: Never  Other Topics Concern   Not on file  Social History Narrative    01/23/21   From: the area   Living: with parents   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, 1 sibling      Enjoys: running      Exercise: daily running    Diet: meat, veggies, grains      Safety   Seat belts: Yes    Guns: No   Safe in relationships: Yes    Social Determinants of Corporate investment banker Strain: Not on file  Food Insecurity: No Food Insecurity (09/24/2022)   Hunger Vital Sign    Worried About Running Out of Food in the Last Year: Never true    Ran Out of Food in the Last Year: Never true  Transportation Needs: No Transportation Needs (09/24/2022)   PRAPARE - Administrator, Civil Service (Medical): No    Lack of Transportation (Non-Medical): No  Physical Activity: Not on file  Stress: Not on file  Social Connections: Not on file   Additional Social History:     Sleep: Good  Appetite:  Good  Current Medications: Current Facility-Administered Medications  Medication Dose Route Frequency Provider Last Rate Last Admin   acetaminophen (TYLENOL) tablet 650 mg  650 mg Oral Q6H PRN Cecilie Lowers, FNP   650 mg at 09/27/22 0754   alum & mag hydroxide-simeth (MAALOX/MYLANTA) 200-200-20 MG/5ML suspension 30 mL  30 mL Oral Q4H PRN Ntuen, Jesusita Oka, FNP       benztropine (COGENTIN) tablet 0.5 mg  0.5 mg Oral BID Starleen Blue, NP   0.5 mg at 09/30/22 0740   hydrOXYzine (ATARAX) tablet 25 mg  25 mg Oral TID PRN Cecilie Lowers, FNP   25 mg at 09/30/22 1446   OLANZapine zydis (ZYPREXA) disintegrating tablet 5 mg  5 mg Oral Q8H PRN Starleen Blue, NP   5 mg at 09/28/22 1033   And   LORazepam (ATIVAN) tablet 1 mg  1 mg Oral PRN Starleen Blue, NP       And   ziprasidone (GEODON) injection 20 mg  20 mg Intramuscular PRN Madelynne Lasker, NP       magnesium hydroxide (MILK OF MAGNESIA) suspension 30 mL  30 mL Oral Daily PRN Ntuen, Jesusita Oka, FNP       risperiDONE (RISPERDAL M-TABS) disintegrating tablet 4  mg  4 mg Oral BID Alyan Hartline, NP       traZODone (DESYREL) tablet 50 mg  50 mg Oral QHS Nerida Boivin, NP       Lab Results:  No results found for this or any previous visit (from the past 48 hour(s)). Blood Alcohol level:  Lab Results  Component Value Date   [facility] <10 09/09/2022   ETH <10 08/18/2022    Metabolic Disorder Labs: Lab Results  Component Value Date   HGBA1C 4.8 09/25/2022   MPG  91.06 09/25/2022   MPG 93.93 05/28/2022   Lab Results  Component Value Date   PROLACTIN 10.0 08/24/2018   Lab Results  Component Value Date   CHOL 122 09/25/2022   TRIG 27 09/25/2022   HDL 42 09/25/2022   CHOLHDL 2.9 09/25/2022   VLDL 5 09/25/2022   LDLCALC 75 09/25/2022   LDLCALC 41 05/28/2022   Physical Findings: AIMS: 0 CIWA: n/a   COWS: n/a  Musculoskeletal: Strength & Muscle Tone: within normal limits Gait & Station: normal Patient leans: N/A  Psychiatric Specialty Exam:  Presentation  General Appearance:  Appropriate for Environment; Fairly Groomed  Eye Contact: Good  Speech: Clear and Coherent  Speech Volume: Normal  Handedness: Right  Mood and Affect  Mood: Depressed; Anxious  Affect: Congruent  Thought Process  Thought Processes: Disorganized  Descriptions of Associations:Intact  Orientation:Full (Time, Place and Person)  Thought Content:Logical  History of Schizophrenia/Schizoaffective disorder:Yes  Duration of Psychotic Symptoms:Greater than six months  Hallucinations:Hallucinations: None  Ideas of Reference:Delusions  Suicidal Thoughts:Suicidal Thoughts: No  Homicidal Thoughts:Homicidal Thoughts: No  Sensorium  Memory: Immediate Good  Judgment: Poor  Insight: Poor  Executive Functions  Concentration: Fair  Attention Span: Fair  Recall: Fair  Fund of Knowledge: Fair  Language: Fair  Psychomotor Activity  Psychomotor Activity: Psychomotor Activity: Normal  Assets  Assets: Manufacturing systems engineer;  Housing; Social Support  Sleep  Sleep: Sleep: Good  Physical Exam: Physical Exam Constitutional:      Appearance: Normal appearance.  HENT:     Head: Normocephalic.     Nose: No congestion or rhinorrhea.  Eyes:     Pupils: Pupils are equal, round, and reactive to light.  Pulmonary:     Effort: Pulmonary effort is normal.  Musculoskeletal:        General: Normal range of motion.     Cervical back: Normal range of motion.  Neurological:     Mental Status: He is alert and oriented to person, place, and time.    Review of Systems  Constitutional: Negative.   HENT: Negative.    Eyes: Negative.   Respiratory:  Negative for cough.   Cardiovascular:  Negative for chest pain.  Gastrointestinal: Negative.  Negative for heartburn.  Genitourinary: Negative.   Musculoskeletal: Negative.   Skin: Negative.   Neurological: Negative.   Psychiatric/Behavioral:  Positive for depression. Negative for hallucinations, memory loss, substance abuse and suicidal ideas. The patient is nervous/anxious and has insomnia.    Blood pressure 135/83, pulse (!) 105, temperature 98.6 F (37 C), temperature source Oral, resp. rate 16, height 5\' 10"  (1.778 m), weight 97.1 kg, SpO2 99 %. Body mass index is 30.71 kg/m.  Treatment Plan Summary: Daily contact with patient to assess and evaluate symptoms and progress in treatment and Medication management   Observation Level/Precautions:  15 minute checks  Laboratory:  Labs reviewed   Psychotherapy:  Unit Group sessions  Medications:  See [facility]  Consultations:  To be determined   Discharge Concerns:  Safety, medication compliance, mood stability  Estimated LOS: 5-7 days  Other:  N/A     PLAN Safety and Monitoring: Voluntary admission to inpatient psychiatric unit for safety, stabilization and treatment Daily contact with patient to assess and evaluate symptoms and progress in treatment Patient's case to be discussed in multi-disciplinary team  meeting Observation Level : q15 minute checks Vital signs: q12 hours Precautions: Safety   Long Term Goal(s): Improvement in symptoms so as ready for discharge   Short Term Goals: Ability to  identify changes in lifestyle to reduce recurrence of condition will improve, Ability to disclose and discuss suicidal ideas, Ability to demonstrate self-control will improve, Ability to identify and develop effective coping behaviors will improve, Compliance with prescribed medications will improve, and Ability to identify triggers associated with substance abuse/mental health issues will improve   Diagnoses  Active Problems:   Anxiety state   Insomnia   Paranoid schizophrenia (HCC)   Medications -Previously discontinued Invega 12 mg nightly for psychosis on 10/19 d/t ineffectiveness. 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  -Increase Risperdal to 4 mg BID for psychosis (Educated on rationales, benefits & possible side effects of medication, including the risk of metabolic syndrome and prolonged Qtc. Verbalized understanding, but will need reinforcement of education once psychosis clears. Repeat EKG ordered for Qtc tracing & no changes from previous EKGs as per Cardiology)  -Continue Trazodone 50 mg nightly PRN for insomnia -Continue Hydroxyzine 25 mg TID PRN for anxiety -Continue Agitation protocol (Zyprexa, Ativan, Geodon PRN)-See MAR   Other PRNS -Continue Tylenol 650 mg every 6 hours PRN for mild pain -Continue Maalox 30 mg every 4 hrs PRN for indigestion -Continue Milk of Magnesia as needed every 6 hrs for constipation   Discharge Planning: Social work and case management to assist with discharge planning and identification of hospital follow-up needs prior to discharge Estimated LOS: 5-7 days Discharge Concerns: Need to establish a safety plan; Medication compliance and effectiveness Discharge Goals: Return home with outpatient referrals for mental health follow-up including medication  management/psychotherapy   I certify that inpatient services furnished can reasonably be expected to improve the patient's condition.   Nicholes Rough, NP 09/30/2022, 3:18 PMPatient ID: Mario Thomas, male   DOB: [birth year], 21 y.o.   MRN: [id number] Patient ID: Mario Thomas, male   DOB: 2001-03-16,

## 2022-09-30 NOTE — [facility] Group Notes (Signed)
Pt did not attend group. 

## 2022-10-01 ENCOUNTER — Encounter (HOSPITAL_COMMUNITY): Payer: Self-pay

## 2022-10-01 DIAGNOSIS — F250 Schizoaffective disorder, bipolar type: Secondary | ICD-10-CM | POA: Diagnosis not present

## 2022-10-01 MED ORDER — RISPERIDONE MICROSPHERES ER 25 MG IM SRER
25.0000 mg | INTRAMUSCULAR | Status: DC
  Administered 2022-10-02: 25 mg via INTRAMUSCULAR
  Filled 2022-10-01: qty 2

## 2022-10-01 NOTE — Progress Notes (Signed)

## 2022-10-01 NOTE — Progress Notes (Signed)
[facility] MD Progress Note  10/01/2022 3:47 PM Mario Thomas  MRN:  [id number] Principal Problem: Schizoaffective disorder, bipolar type ([place]) Diagnosis: Principal Problem:   Schizoaffective disorder, bipolar type ([place]) Active Problems:   Anxiety state   Insomnia  History of Present Illness: Mario Thomas is a 21 yo African American male who walked into this [facility] on 09/24/22 with complaints of suicidal and homicidal ideations. Pt was admitted voluntarily for treatment and stabilization of his mood.  Pt with prior hospitalizations at this [facility] from 11/09/21 through 11/17/2021 for manic type behaviors, and from 06/11/22 through 06/20/22 for bizarre behaviors and making threats to his parents.    24 hr chart review: HR elevated at 123 earlier today morning and BP was 140/83. Nursing asked to recheck vitals. Pt is continuing to be compliant with his scheduled medications, and only PRN medication given in the past 24 hrs is Hydroxyzine 25 mg earlier today morning for anxiety. Pt has attended some unit group activities in the past 24 hrs.    Today's patient assessment note: Today, thoughts are more organized and logical than they have been since admission. Speech is clear and coherent, pt is oriented to person, place, time and situation, know current president, and knows the previous 3 Presidents of the [place]. Pt denies auditory hallucinations, does not appear to be responding to any stimuli, and is calm during assessment unlike prior. He is able to state his understanding of the reason for this hospitalization which is coherent with his presentation at time of admission. He denies visual hallucinations, denies feelings of paranoia or other first rank symptoms. He denies suicidal ideations & denies homicidal ideations.  Pt reports a good sleep quality last night, reports appetite as "normal", denies any medication related side effects. No TD/EPS type symptoms found on assessment, and pt denies any feelings of  stiffness. AIMS: 0. We are continuing Risperdal at 4 mg BID. Pt is agreeable to Risperdal [place] inj, and we will administer 5 mg IM tomorrow morning.  Total Time spent with patient: 30 minutes  Past Psychiatric History: psychosis  Past Medical History:  Past Medical History:  Diagnosis Date   Heart murmur    MDD (major depressive disorder), severe ([place]) 08/23/2018   History reviewed. No pertinent surgical history. Family History:  Family History  Problem Relation Age of Onset   Hyperlipidemia Father    Family Psychiatric  History: non reported Social History:  Social History   Substance and Sexual Activity  Alcohol Use Not Currently     Social History   Substance and Sexual Activity  Drug Use Never   Comment: Denies    Social History   Socioeconomic History   Marital status: Single    Spouse name: Not on file   Number of children: Not on file   Years of education: Not on file   Highest education level: Not on file  Occupational History   Not on file  Tobacco Use   Smoking status: Never   Smokeless tobacco: Never  Vaping Use   Vaping Use: Never used  Substance and Sexual Activity   Alcohol use: Not Currently   Drug use: Never    Comment: Denies   Sexual activity: Never  Other Topics Concern   Not on file  Social History Narrative   01/23/21   From: the area   Living: with parents   Work: Advertising copywriter   School: Estate manager/land agent at [employer]      Family: good relationship with  parents, 1 sibling      Enjoys: running      Exercise: daily running    Diet: meat, veggies, grains      Safety   Seat belts: Yes    Guns: No   Safe in relationships: Yes    Social Determinants of Health   Financial Resource Strain: Not on file  Food Insecurity: No Food Insecurity (09/24/2022)   Hunger Vital Sign    Worried About Running Out of Food in the Last Year: Never true    Ran Out of Food in the Last Year: Never true  Transportation Needs: No Transportation Needs  (09/24/2022)   PRAPARE - Hydrologist (Medical): No    Lack of Transportation (Non-Medical): No  Physical Activity: Not on file  Stress: Not on file  Social Connections: Not on file   Additional Social History:     Sleep: Good  Appetite:  Good  Current Medications: Current Facility-Administered Medications  Medication Dose Route Frequency Provider Last Rate Last Admin   acetaminophen (TYLENOL) tablet 650 mg  650 mg Oral Q6H PRN Laretta Bolster, FNP   650 mg at 09/27/22 0754   alum & mag hydroxide-simeth (MAALOX/MYLANTA) 200-200-20 MG/5ML suspension 30 mL  30 mL Oral Q4H PRN Ntuen, Kris Hartmann, FNP       benztropine (COGENTIN) tablet 0.5 mg  0.5 mg Oral BID Nicholes Rough, NP   0.5 mg at 10/01/22 0758   hydrOXYzine (ATARAX) tablet 25 mg  25 mg Oral TID PRN Laretta Bolster, FNP   25 mg at 09/30/22 1446   OLANZapine zydis (ZYPREXA) disintegrating tablet 5 mg  5 mg Oral Q8H PRN Nicholes Rough, NP   5 mg at 09/28/22 1033   And   LORazepam (ATIVAN) tablet 1 mg  1 mg Oral PRN Nicholes Rough, NP       And   ziprasidone (GEODON) injection 20 mg  20 mg Intramuscular PRN Arieh Bogue, NP       magnesium hydroxide (MILK OF MAGNESIA) suspension 30 mL  30 mL Oral Daily PRN Ntuen, Kris Hartmann, FNP       risperiDONE (RISPERDAL M-TABS) disintegrating tablet 4 mg  4 mg Oral BID Pammie Chirino, NP   4 mg at 10/01/22 0758   [START ON 10/02/2022] risperiDONE microspheres (RISPERDAL CONSTA) injection 25 mg  25 mg Intramuscular Q14 Days Willadean Guyton, NP       traZODone (DESYREL) tablet 50 mg  50 mg Oral QHS Nidya Bouyer, NP   50 mg at 09/30/22 2050   Lab Results:  No results found for this or any previous visit (from the past 48 hour(s)). Blood Alcohol level:  Lab Results  Component Value Date   ETH <10 09/09/2022   ETH <10 [id number]    Metabolic Disorder Labs: Lab Results  Component Value Date   HGBA1C 4.8 09/25/2022   MPG 91.06 09/25/2022   MPG 93.93 05/28/2022   Lab  Results  Component Value Date   PROLACTIN 10.0 08/24/2018   Lab Results  Component Value Date   CHOL 122 09/25/2022   TRIG 27 09/25/2022   HDL 42 09/25/2022   CHOLHDL 2.9 09/25/2022   VLDL 5 09/25/2022   LDLCALC 75 09/25/2022   LDLCALC 41 05/28/2022   Physical Findings: AIMS: 0 CIWA: n/a   COWS: n/a  Musculoskeletal: Strength & Muscle Tone: within normal limits Gait & Station: normal Patient leans: N/A  Psychiatric Specialty Exam:  Presentation  General Appearance:  Appropriate for Environment; Fairly Groomed  Eye Contact: Good  Speech: Clear and Coherent  Speech Volume: Normal  Handedness: Right  Mood and Affect  Mood: Depressed  Affect: Congruent  Thought Process  Thought Processes: Coherent  Descriptions of Associations:Intact  Orientation:Full (Time, Place and Person)  Thought Content:Logical  History of Schizophrenia/Schizoaffective disorder:Yes  Duration of Psychotic Symptoms:Greater than six months  Hallucinations:Hallucinations: None  Ideas of Reference:None  Suicidal Thoughts:Suicidal Thoughts: No  Homicidal Thoughts:Homicidal Thoughts: No  Sensorium  Memory: Immediate Good  Judgment: Fair  Insight: Fair  Community education officer  Concentration: Good  Attention Span: Good  Recall: Good  Fund of Knowledge: Good  Language: Good  Psychomotor Activity  Psychomotor Activity: Psychomotor Activity: Normal  Assets  Assets: Social Support; Housing; Resilience  Sleep  Sleep: Sleep: Good  Physical Exam: Physical Exam Constitutional:      Appearance: Normal appearance.  HENT:     Head: Normocephalic.     Nose: No congestion or rhinorrhea.  Eyes:     Pupils: Pupils are equal, round, and reactive to light.  Pulmonary:     Effort: Pulmonary effort is normal.  Musculoskeletal:        General: Normal range of motion.     Cervical back: Normal range of motion.  Neurological:     Mental Status: He is alert  and oriented to person, place, and time.    Review of Systems  Constitutional: Negative.   HENT: Negative.    Eyes: Negative.   Respiratory:  Negative for cough.   Cardiovascular:  Negative for chest pain.  Gastrointestinal: Negative.  Negative for heartburn.  Genitourinary: Negative.   Musculoskeletal: Negative.   Skin: Negative.   Neurological: Negative.   Psychiatric/Behavioral:  Positive for depression. Negative for hallucinations, memory loss, substance abuse and suicidal ideas. The patient is nervous/anxious and has insomnia.    Blood pressure 127/63, pulse (!) 118, temperature 98.1 F (36.7 C), temperature source Oral, resp. rate 16, height 5\' 10"  (1.778 m), weight 97.1 kg, SpO2 100 %. Body mass index is 30.71 kg/m.  Treatment Plan Summary: Daily contact with patient to assess and evaluate symptoms and progress in treatment and Medication management   Observation Level/Precautions:  15 minute checks  Laboratory:  Labs reviewed   Psychotherapy:  Unit Group sessions  Medications:  See [facility]  Consultations:  To be determined   Discharge Concerns:  Safety, medication compliance, mood stability  Estimated LOS: 5-7 days  Other:  N/A     PLAN Safety and Monitoring: Voluntary admission to inpatient psychiatric unit for safety, stabilization and treatment Daily contact with patient to assess and evaluate symptoms and progress in treatment Patient's case to be discussed in multi-disciplinary team meeting Observation Level : q15 minute checks Vital signs: q12 hours Precautions: Safety   Long Term Goal(s): Improvement in symptoms so as ready for discharge   Short Term Goals: Ability to identify changes in lifestyle to reduce recurrence of condition will improve, Ability to disclose and discuss suicidal ideas, Ability to demonstrate self-control will improve, Ability to identify and develop effective coping behaviors will improve, Compliance with prescribed medications will  improve, and Ability to identify triggers associated with substance abuse/mental health issues will improve   Diagnoses  Active Problems:   Anxiety state   Insomnia   Paranoid schizophrenia (HCC)   Medications -Give Risperdal [place] inj IM 5 mg on 10/24 @ 0800 -Previously discontinued Invega 12 mg nightly for psychosis on 10/19 d/t ineffectiveness. 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  -  Continue Risperdal to 4 mg BID for psychosis (Educated on rationales, benefits & possible side effects of medication, including the risk of metabolic syndrome and prolonged Qtc. Verbalized understanding, but will need reinforcement of education once psychosis clears. Repeat EKG ordered for Qtc tracing & no changes from previous EKGs as per Cardiology)  -Continue Trazodone 50 mg nightly PRN for insomnia -Continue Hydroxyzine 25 mg TID PRN for anxiety -Continue Agitation protocol (Zyprexa, Ativan, Geodon PRN)-See MAR   Other PRNS -Continue Tylenol 650 mg every 6 hours PRN for mild pain -Continue Maalox 30 mg every 4 hrs PRN for indigestion -Continue Milk of Magnesia as needed every 6 hrs for constipation   Discharge Planning: Social work and case management to assist with discharge planning and identification of hospital follow-up needs prior to discharge Estimated LOS: 5-7 days Discharge Concerns: Need to establish a safety plan; Medication compliance and effectiveness Discharge Goals: Return home with outpatient referrals for mental health follow-up including medication management/psychotherapy   I certify that inpatient services furnished can reasonably be expected to improve the patient's condition.   Nicholes Rough, NP 10/01/2022, 3:47 PMPatient ID: Mario Thomas, male   DOB: 25-Oct-2001, 21 y.o.   MRN: [id number] Patient ID: Mario Thomas, male   DOB: November 15, 2001, Patient ID: Mario Thomas, male   DOB: 2001/10/19, 21 y.o.   MRN: [id number]

## 2022-10-01 NOTE — Progress Notes (Signed)
Adult Psychoeducational Group Note  Date:  10/01/2022 Time:  8:08 PM  Group Topic/Focus:  Wrap-Up Group:   The focus of this group is to help patients review their daily goal of treatment and discuss progress on daily workbooks.  Participation Level:  Active  Participation Quality:  Appropriate  Affect:  Appropriate  Cognitive:  Appropriate  Insight: Appropriate  Engagement in Group:  Engaged  Modes of Intervention:  Discussion  Additional Comments:   Pt states he had a good day, and was able to get good rest today. Pt is looking forward to continuing his programming and staying on track with his medication.  Gerhard Perches 10/01/2022, 8:08 PM

## 2022-10-01 NOTE — Group Note (Signed)
ype of Therapy and Topic:  Group Therapy:  Cycle of Depression   Participation Level:  Active   Description of Group:  Patients in this group were introduced to the idea of the cycle of depression. Patients explored how stressors can trigger thoughts, feels and physical symptoms that can make you behave in ways that increase symptoms of depression.  Patient identified specific stressors that have triggered depression and explored thoughts that they have about themselves that may not always be true.   Patients encouraged to come up with positive changes and interventions put in place to stop cycle of depression. Patients also participated in discussion about benefits of being able to identify stressors, thoughts, feels and behavioral responses when depressed.      Therapeutic Goals:               1)  To discuss the positive and negative impacts of depressive feels             2)  identify signs and symptoms of depression             3)  discuss alternative behaviors to stop cycle of depression             4)  offer mutual support to others regarding depression             5)  Developing plans for ways to manage specific stressors upon discharge               Summary of Patient Progress:  Patient participated appropriately in group.  Patient had good insight into group topic.  Patient states that when he is down and depressed he can listen to music to stop the cycle of depression. Patient open to feedback from peers regarding group topic.    Therapeutic Modalities:   Motivational Interviewing Brief Solution-Focused Therapy    Nettie Wyffels, LCSW, [place] Social Worker  [facility]

## 2022-10-01 NOTE — BHH Group Notes (Signed)
Adult Psychoeducational Group Note  Date:  10/01/2022 Time:  9:25 AM  Group Topic/Focus:  Goals Group:   The focus of this group is to help patients establish daily goals to achieve during treatment and discuss how the patient can incorporate goal setting into their daily lives to aide in recovery.  Participation Level:  Did Not Attend    Dub Mikes 10/01/2022, 9:25 AM

## 2022-10-01 NOTE — BH IP Treatment Plan (Signed)
Interdisciplinary Treatment and Diagnostic Plan Update  10/01/2022 Time of Session: update Mj Willis MRN: [id number]  Principal Diagnosis: Schizoaffective disorder, bipolar type [facility])  Secondary Diagnoses: Principal Problem:   Schizoaffective disorder, bipolar type (HCC) Active Problems:   Anxiety state   Insomnia   Current Medications:  Current Facility-Administered Medications  Medication Dose Route Frequency Provider Last Rate Last Admin   acetaminophen (TYLENOL) tablet 650 mg  650 mg Oral Q6H PRN Cecilie Lowers, FNP   650 mg at 09/27/22 0754   alum & mag hydroxide-simeth (MAALOX/MYLANTA) 200-200-20 MG/5ML suspension 30 mL  30 mL Oral Q4H PRN Ntuen, Jesusita Oka, FNP       benztropine (COGENTIN) tablet 0.5 mg  0.5 mg Oral BID Starleen Blue, NP   0.5 mg at 10/01/22 0758   hydrOXYzine (ATARAX) tablet 25 mg  25 mg Oral TID PRN Cecilie Lowers, FNP   25 mg at 09/30/22 1446   OLANZapine zydis (ZYPREXA) disintegrating tablet 5 mg  5 mg Oral Q8H PRN Starleen Blue, NP   5 mg at 09/28/22 1033   And   LORazepam (ATIVAN) tablet 1 mg  1 mg Oral PRN Starleen Blue, NP       And   ziprasidone (GEODON) injection 20 mg  20 mg Intramuscular PRN Nkwenti, Tyler Aas, NP       magnesium hydroxide (MILK OF MAGNESIA) suspension 30 mL  30 mL Oral Daily PRN Ntuen, Jesusita Oka, FNP       risperiDONE (RISPERDAL M-TABS) disintegrating tablet 4 mg  4 mg Oral BID Starleen Blue, NP   4 mg at 10/01/22 0758   traZODone (DESYREL) tablet 50 mg  50 mg Oral QHS Starleen Blue, NP   50 mg at 09/30/22 2050   PTA Medications: Medications Prior to Admission  Medication Sig Dispense Refill Last Dose   benztropine (COGENTIN) 1 MG tablet Take 1 tablet (1 mg total) by mouth 2 (two) times daily. (Patient not taking: Reported on 08/20/2022) 60 tablet 0    INVEGA SUSTENNA 234 MG/1.5ML injection Inject 234 mg into the muscle every 30 (thirty) days.      paliperidone (INVEGA) 9 MG 24 hr tablet Take 1 tablet (9 mg total) by mouth daily. 30  tablet 0    traZODone (DESYREL) 50 MG tablet Take 1 tablet (50 mg total) by mouth at bedtime as needed for sleep. (Patient not taking: Reported on 08/20/2022) 30 tablet 0     Patient Stressors: Medication change or noncompliance   Other: Psychosis/Delusional     Patient Strengths: Average or above average intelligence  Physical Health  Supportive family/friends   Treatment Modalities: Medication Management, Group therapy, Case management,  1 to 1 session with clinician, Psychoeducation, Recreational therapy.   Physician Treatment Plan for Primary Diagnosis: Schizoaffective disorder, bipolar type (HCC) Long Term Goal(s): Improvement in symptoms so as ready for discharge   Short Term Goals: Ability to identify changes in lifestyle to reduce recurrence of condition will improve Ability to disclose and discuss suicidal ideas Ability to demonstrate self-control will improve Ability to identify and develop effective coping behaviors will improve Compliance with prescribed medications will improve Ability to identify triggers associated with substance abuse/mental health issues will improve  Medication Management: Evaluate patient's response, side effects, and tolerance of medication regimen.  Therapeutic Interventions: 1 to 1 sessions, Unit Group sessions and Medication administration.  Evaluation of Outcomes: Progressing  Physician Treatment Plan for Secondary Diagnosis: Principal Problem:   Schizoaffective disorder, bipolar type (HCC) Active Problems:  Anxiety state   Insomnia  Long Term Goal(s): Improvement in symptoms so as ready for discharge   Short Term Goals: Ability to identify changes in lifestyle to reduce recurrence of condition will improve Ability to disclose and discuss suicidal ideas Ability to demonstrate self-control will improve Ability to identify and develop effective coping behaviors will improve Compliance with prescribed medications will improve Ability  to identify triggers associated with substance abuse/mental health issues will improve     Medication Management: Evaluate patient's response, side effects, and tolerance of medication regimen.  Therapeutic Interventions: 1 to 1 sessions, Unit Group sessions and Medication administration.  Evaluation of Outcomes: Progressing   RN Treatment Plan for Primary Diagnosis: Schizoaffective disorder, bipolar type ([place]) Long Term Goal(s): Knowledge of disease and therapeutic regimen to maintain health will improve  Short Term Goals: Ability to remain free from injury will improve, Ability to verbalize frustration and anger appropriately will improve, Ability to demonstrate self-control, Ability to participate in decision making will improve, Ability to verbalize feelings will improve, Ability to disclose and discuss suicidal ideas, Ability to identify and develop effective coping behaviors will improve, and Compliance with prescribed medications will improve  Medication Management: RN will administer medications as ordered by provider, will assess and evaluate patient's response and provide education to patient for prescribed medication. RN will report any adverse and/or side effects to prescribing provider.  Therapeutic Interventions: 1 on 1 counseling sessions, Psychoeducation, Medication administration, Evaluate responses to treatment, Monitor vital signs and CBGs as ordered, Perform/monitor CIWA, COWS, AIMS and Fall Risk screenings as ordered, Perform wound care treatments as ordered.  Evaluation of Outcomes: Progressing   LCSW Treatment Plan for Primary Diagnosis: Schizoaffective disorder, bipolar type ([place]) Long Term Goal(s): Safe transition to appropriate next level of care at discharge, Engage patient in therapeutic group addressing interpersonal concerns.  Short Term Goals: Engage patient in aftercare planning with referrals and resources, Increase social support, Increase ability to  appropriately verbalize feelings, Increase emotional regulation, Facilitate acceptance of mental health diagnosis and concerns, Facilitate patient progression through stages of change regarding substance use diagnoses and concerns, Identify triggers associated with mental health/substance abuse issues, and Increase skills for wellness and recovery  Therapeutic Interventions: Assess for all discharge needs, 1 to 1 time with Social worker, Explore available resources and support systems, Assess for adequacy in community support network, Educate family and significant other(s) on suicide prevention, Complete Psychosocial Assessment, Interpersonal group therapy.  Evaluation of Outcomes: Progressing   Progress in Treatment: Attending groups: Yes. Participating in groups: Yes. Taking medication as prescribed: Yes. Toleration medication: Yes. Family/Significant other contact made: Yes, individual(s) contacted:  mother, [place],  [phone] Patient understands diagnosis: No. Discussing patient identified problems/goals with staff: Yes. Medical problems stabilized or resolved: Yes. Denies suicidal/homicidal ideation: Yes. Issues/concerns per patient self-inventory: No.   New problem(s) identified: No, Describe:  none reported  New Short Term/Long Term Goal(s):  medication stabilization, elimination of SI thoughts, development of comprehensive mental wellness plan.    Patient Goals:  Pt continues to work on initial treatment team goals of, "to get better across the board."  Discharge Plan or Barriers: Pt has no psychosocial barriers at this time.  Patient has assessment on Thursday to get follow up with an ACTT upon discharge.    Reason for Continuation of Hospitalization: Delusions  Depression Hallucinations Mania Medication stabilization  Estimated Length of Stay: 3-7 days  Last 3 [place] Suicide Severity Risk Score: Flowsheet Row Admission (Current) from [facility]/09[facility]2022 [facility] from 08/18/2022 in [facility]  C-SSRS RISK CATEGORY High Risk High Risk High Risk       Last PHQ 2/9 Scores:    11/27/2019    9:41 AM 08/04/2018    9:03 AM  Depression screen PHQ 2/9  Decreased Interest 0 0  Down, Depressed, Hopeless 0 0  PHQ - 2 Score 0 0    Scribe for Treatment Team: Beatris Si, LCSW 10/01/2022 3:23 PM

## 2022-10-02 DIAGNOSIS — F250 Schizoaffective disorder, bipolar type: Secondary | ICD-10-CM | POA: Diagnosis not present

## 2022-10-02 MED ORDER — RISPERIDONE MICROSPHERES ER 25 MG IM SRER
25.0000 mg | INTRAMUSCULAR | Status: DC
  Administered 2022-10-03: 25 mg via INTRAMUSCULAR
  Filled 2022-10-02: qty 2

## 2022-10-02 MED ORDER — PROPRANOLOL HCL 10 MG PO TABS
10.0000 mg | ORAL_TABLET | Freq: Two times a day (BID) | ORAL | Status: DC
  Administered 2022-10-02 – 2022-10-05 (×6): 10 mg via ORAL
  Filled 2022-10-02 (×11): qty 1

## 2022-10-02 MED ORDER — BENZTROPINE MESYLATE 1 MG PO TABS
1.0000 mg | ORAL_TABLET | Freq: Two times a day (BID) | ORAL | Status: DC
  Administered 2022-10-02 – 2022-10-03 (×2): 1 mg via ORAL
  Filled 2022-10-02 (×6): qty 1

## 2022-10-02 NOTE — Group Note (Unsigned)
Recreation Therapy Group Note   Group Topic:Coping Skills  Group Date: 10/02/2022 Start Time: 1000 End Time: 1050 Facilitators: Leland Raver-McCall, Corrisa Gibby A, NT Location: 500 Hall Dayroom       Affect/Mood: {RT BHH Affect/Mood:26271}   Participation Level: {RT BHH Participation Level:26267}   Participation Quality: {RT BHH Participation Quality:26268}   Behavior: {RT BHH Group Behavior:26269}   Speech/Thought Process: {RT BHH Speech/Thought:26276}   Insight: {RT BHH Insight:26272}   Judgement: {RT BHH Judgement:26278}   Modes of Intervention: {RT BHH Modes of Intervention:26277}   Patient Response to Interventions:  {RT BHH Patient Response to Intervention:26274}   Education Outcome:  {RT BHH Education Outcome:26279}   Clinical Observations/Individualized Feedback: *** was *** in their participation of session activities and group discussion. Pt identified ***   Plan: {RT BHH Tx Plan:26280}   Chasitty Hehl A Uthman Mroczkowski-McCall, NT,  10/02/2022 11:10 AM 

## 2022-10-02 NOTE — Progress Notes (Signed)
   10/02/22 2035  Psych Admission Type (Psych Patients Only)  Admission Status Voluntary  Psychosocial Assessment  Patient Complaints None  Eye Contact Fair  Facial Expression Other (Comment) (Appropriate to circumstance)  Affect Appropriate to circumstance  Speech Logical/coherent  Interaction Guarded;Forwards little  Motor Activity Other (Comment) (WDL)  Appearance/Hygiene Unremarkable  Behavior Characteristics Cooperative  Mood Pleasant  Aggressive Behavior  Effect No apparent injury  Thought Process  Coherency WDL  Content WDL  Delusions None reported or observed  Perception WDL  Hallucination None reported or observed  Judgment Poor  Confusion None  Danger to Self  Current suicidal ideation? Denies  Agreement Not to Harm Self Yes  Description of Agreement Verbal contract  Danger to Others  Danger to Others None reported or observed   Patient alert and oriented, appropriate to circumstance with pleasant mood. Denies SI, HI, AVH, and pain.Scheduled medications administered to patient, per provider orders. Support and encouragement provided. Routine safety checks conducted every 15 minutes.Patient verbally contracts for safety and remains safe on the unit.

## 2022-10-02 NOTE — Progress Notes (Signed)
[facility] MD Progress Note  10/02/2022 4:19 PM [name]  MRN:  [id number] Principal Problem: Schizoaffective disorder, bipolar type (HCC) Diagnosis: Principal Problem:   Schizoaffective disorder, bipolar type (HCC) Active Problems:   Anxiety state   Insomnia  History of Present Illness: Mario Thomas is a 21 yo African American male who walked into this [facility] on 09/24/22 with complaints of suicidal and homicidal ideations. Pt was admitted voluntarily for treatment and stabilization of his mood.  Pt with prior hospitalizations at this [facility] from 11/09/21 through 11/17/2021 for manic type behaviors, and from 06/11/22 through 06/20/22 for bizarre behaviors and making threats to his parents.    24 hr chart review: HR elevated at 116 earlier today morning and BP was WNL. Pt is continuing to be compliant with his scheduled medications, and has not required any PRN medication in the past 24 hrs. Pt has attended some unit group activities in the past 24 hrs.   Today's patient assessment note: Pt is continuing to be coherent with improvements being noted to his mood every day. Today, he denies SI, denies HI, denies AVH, denies paranoia and there is no evidence of delusional thinking.  Patient asked how he knows that he has made some improvements and states "I no longer feel like killing myself or killing anybody else."  Patient asked what has helped him during this hospitalization and states "the medication has helped a lot."  Patient is more insightful today than he has been throughout hospitalization.  Attending physician has made attempts to contact patient's mother, to discuss after discharge follow-up plan, but mother has not answered calls.  Pt reports a good sleep quality last night, reports appetite as "normal", denies any medication related side effects.  Very mild tremor is observed on bilateral fingers and tongue.  Patient currently on Cogentin 0.5 mg, will increase Cogentin to 1 mg BID.  Will also add  propranolol 10 mg twice daily for help with tachycardia and anxiety.  Patient given risk Risperdal Consta total of 50 mg today. Will continue Risperdal 4 mg BID. Will continue other medications as listed below.  Patient reports a good sleep quality last night and also reports a good appetite.  Mild tremors noted to b/l fingers and tongue, no stiffness found on assessment. Aims is 2.  Total Time spent with patient: 30 minutes  Past Psychiatric History: psychosis  Past Medical History:  Past Medical History:  Diagnosis Date   Heart murmur    MDD (major depressive disorder), severe (HCC) 08/23/2018   History reviewed. No pertinent surgical history. Family History:  Family History  Problem Relation Age of Onset   Hyperlipidemia Father    Family Psychiatric  History: non reported Social History:  Social History   Substance and Sexual Activity  Alcohol Use Not Currently     Social History   Substance and Sexual Activity  Drug Use Never   Comment: Denies    Social History   Socioeconomic History   Marital status: Single    Spouse name: Not on file   Number of children: Not on file   Years of education: Not on file   Highest education level: Not on file  Occupational History   Not on file  Tobacco Use   Smoking status: Never   Smokeless tobacco: Never  Vaping Use   Vaping Use: Never used  Substance and Sexual Activity   Alcohol use: Not Currently   Drug use: Never    Comment: Denies   Sexual  activity: Never  Other Topics Concern   Not on file  Social History Narrative   01/23/21   From: the area   Living: with parents   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, 1 sibling      Enjoys: running      Exercise: daily running    Diet: meat, veggies, grains      Safety   Seat belts: Yes    Guns: No   Safe in relationships: Yes    Social Determinants of Corporate investment banker Strain: Not on file   Food Insecurity: No Food Insecurity (09/24/2022)   Hunger Vital Sign    Worried About Running Out of Food in the Last Year: Never true    Ran Out of Food in the Last Year: Never true  Transportation Needs: No Transportation Needs (09/24/2022)   PRAPARE - Administrator, Civil Service (Medical): No    Lack of Transportation (Non-Medical): No  Physical Activity: Not on file  Stress: Not on file  Social Connections: Not on file   Additional Social History:     Sleep: Good  Appetite:  Good  Current Medications: Current Facility-Administered Medications  Medication Dose Route Frequency Provider Last Rate Last Admin   acetaminophen (TYLENOL) tablet 650 mg  650 mg Oral Q6H PRN Ntuen, Jesusita Oka, FNP   650 mg at 09/27/22 0754   alum & mag hydroxide-simeth (MAALOX/MYLANTA) 200-200-20 MG/5ML suspension 30 mL  30 mL Oral Q4H PRN Ntuen, Jesusita Oka, FNP       benztropine (COGENTIN) tablet 1 mg  1 mg Oral BID Starleen Blue, NP       hydrOXYzine (ATARAX) tablet 25 mg  25 mg Oral TID PRN Cecilie Lowers, FNP   25 mg at 09/30/22 1446   OLANZapine zydis (ZYPREXA) disintegrating tablet 5 mg  5 mg Oral Q8H PRN Starleen Blue, NP   5 mg at 09/28/22 1033   And   LORazepam (ATIVAN) tablet 1 mg  1 mg Oral PRN Starleen Blue, NP       And   ziprasidone (GEODON) injection 20 mg  20 mg Intramuscular PRN Akanksha Bellmore, NP       magnesium hydroxide (MILK OF MAGNESIA) suspension 30 mL  30 mL Oral Daily PRN Ntuen, Jesusita Oka, FNP       propranolol (INDERAL) tablet 10 mg  10 mg Oral BID Lakima Dona, NP       risperiDONE (RISPERDAL M-TABS) disintegrating tablet 4 mg  4 mg Oral BID Kynzli Rease, NP   4 mg at 10/02/22 0808   [START ON 10/03/2022] risperiDONE microspheres (RISPERDAL CONSTA) injection 25 mg  25 mg Intramuscular Q14 Days Cyanna Neace, NP       traZODone (DESYREL) tablet 50 mg  50 mg Oral QHS Nathanael Krist, NP   50 mg at 10/01/22 2000   Lab Results:  No results found for this or any previous  visit (from the past 48 hour(s)). Blood Alcohol level:  Lab Results  Component Value Date   [facility] <10 09/09/2022   ETH <10 08/18/2022    Metabolic Disorder Labs: Lab Results  Component Value Date   HGBA1C 4.8 09/25/2022   MPG 91.06 09/25/2022   MPG 93.93 05/28/2022   Lab Results  Component Value Date   PROLACTIN 10.0 08/24/2018   Lab Results  Component Value Date   CHOL 122 09/25/2022   TRIG  27 09/25/2022   HDL 42 09/25/2022   CHOLHDL 2.9 09/25/2022   VLDL 5 09/25/2022   LDLCALC 75 09/25/2022   LDLCALC 41 05/28/2022   Physical Findings: AIMS: 0 CIWA: n/a   COWS: n/a  Musculoskeletal: Strength & Muscle Tone: within normal limits Gait & Station: normal Patient leans: N/A  Psychiatric Specialty Exam:  Presentation  General Appearance:  Appropriate for Environment; Fairly Groomed  Eye Contact: Good  Speech: Clear and Coherent  Speech Volume: Normal  Handedness: Right  Mood and Affect  Mood: Depressed  Affect: Congruent  Thought Process  Thought Processes: Coherent  Descriptions of Associations:Intact  Orientation:Full (Time, Place and Person)  Thought Content:Logical  History of Schizophrenia/Schizoaffective disorder:Yes  Duration of Psychotic Symptoms:Greater than six months  Hallucinations:Hallucinations: None  Ideas of Reference:None  Suicidal Thoughts:Suicidal Thoughts: No  Homicidal Thoughts:Homicidal Thoughts: No  Sensorium  Memory: Immediate Good  Judgment: Fair  Insight: Fair  Art therapist  Concentration: Good  Attention Span: Good  Recall: Good  Fund of Knowledge: Good  Language: Good  Psychomotor Activity  Psychomotor Activity: Psychomotor Activity: Other (comment) (mild tremors to tongue and fingers, no stiffness on assessment)  Assets  Assets: Manufacturing systems engineer; Housing; Social Support  Sleep  Sleep: Sleep: Good  Physical Exam: Physical Exam Constitutional:      Appearance:  Normal appearance.  HENT:     Head: Normocephalic.     Nose: No congestion or rhinorrhea.  Eyes:     Pupils: Pupils are equal, round, and reactive to light.  Pulmonary:     Effort: Pulmonary effort is normal.  Musculoskeletal:        General: Normal range of motion.     Cervical back: Normal range of motion.  Neurological:     Mental Status: He is alert and oriented to person, place, and time.    Review of Systems  Constitutional: Negative.   HENT: Negative.    Eyes: Negative.   Respiratory:  Negative for cough.   Cardiovascular:  Negative for chest pain.  Gastrointestinal: Negative.  Negative for heartburn.  Genitourinary: Negative.   Musculoskeletal: Negative.   Skin: Negative.   Neurological: Negative.   Psychiatric/Behavioral:  Positive for depression. Negative for hallucinations, memory loss, substance abuse and suicidal ideas. The patient is nervous/anxious and has insomnia.    Blood pressure 123/71, pulse (!) 116, temperature 98 F (36.7 C), temperature source Oral, resp. rate 16, height 5\' 10"  (1.778 m), weight 97.1 kg, SpO2 99 %. Body mass index is 30.71 kg/m.  Treatment Plan Summary: Daily contact with patient to assess and evaluate symptoms and progress in treatment and Medication management   Observation Level/Precautions:  15 minute checks  Laboratory:  Labs reviewed   Psychotherapy:  Unit Group sessions  Medications:  See [facility]  Consultations:  To be determined   Discharge Concerns:  Safety, medication compliance, mood stability  Estimated LOS: 5-7 days  Other:  N/A     PLAN Safety and Monitoring: Voluntary admission to inpatient psychiatric unit for safety, stabilization and treatment Daily contact with patient to assess and evaluate symptoms and progress in treatment Patient's case to be discussed in multi-disciplinary team meeting Observation Level : q15 minute checks Vital signs: q12 hours Precautions: Safety   Long Term Goal(s): Improvement in  symptoms so as ready for discharge   Short Term Goals: Ability to identify changes in lifestyle to reduce recurrence of condition will improve, Ability to disclose and discuss suicidal ideas, Ability to demonstrate self-control will improve, Ability to  identify and develop effective coping behaviors will improve, Compliance with prescribed medications will improve, and Ability to identify triggers associated with substance abuse/mental health issues will improve   Diagnoses  Active Problems:   Anxiety state   Insomnia   Paranoid schizophrenia (HCC)   Medications -Given Risperdal Consta inj IM 50 mg on 10/24 -Previously discontinued Invega 12 mg nightly for psychosis on 10/19 d/t ineffectiveness. 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  -Continue Risperdal to 4 mg BID for psychosis (Educated on rationales, benefits & possible side effects of medication, including the risk of metabolic syndrome and prolonged Qtc. Verbalized understanding, but will need reinforcement of education once psychosis clears. Repeat EKG ordered for Qtc tracing & no changes from previous EKGs as per Cardiology)  -Start Inderal 10 mg BID for anxiety and tachycardia -Continue Trazodone 50 mg nightly PRN for insomnia -Continue Hydroxyzine 25 mg TID PRN for anxiety -Continue Agitation protocol (Zyprexa, Ativan, Geodon PRN)-See MAR   Other PRNS -Continue Tylenol 650 mg every 6 hours PRN for mild pain -Continue Maalox 30 mg every 4 hrs PRN for indigestion -Continue Milk of Magnesia as needed every 6 hrs for constipation   Discharge Planning: Social work and case management to assist with discharge planning and identification of hospital follow-up needs prior to discharge Estimated LOS: 5-7 days Discharge Concerns: Need to establish a safety plan; Medication compliance and effectiveness Discharge Goals: Return home with outpatient referrals for mental health follow-up including medication management/psychotherapy   I  certify that inpatient services furnished can reasonably be expected to improve the patient's condition.   Nicholes Rough, NP 10/02/2022, 4:19 PMPatient ID: Shella Spearing, male   DOB: [birth year], 21 y.o.   MRN: [id number] Patient ID: Joash [name], male   DOB: 2001/08/25, Patient ID: Shella Spearing, male   DOB: [birth year], 21 y.o.   MRN: [id number] Patient ID: Ryshawn Sanzone, male   DOB: 2001/05/10, 21 y.o.   MRN: [id number]

## 2022-10-02 NOTE — Progress Notes (Signed)
Pt presents less anxious, less hyperactive, hyper-verbal and tangential this shift. He's still disorganized but is easily redirectable. Visible in milieu for scheduled groups, recreational activity, meals and medications when prompted. Denies SI, HI, AVH and pain when assessed. He remains medication compliant without adverse drug reactions. Safety checks maintained at Q 15 minutes intervals without incident. Verbal education done on current treatment regimen including Risperdal Consta given this shift; pt verbalizes understanding. He tolerates all meals and fluids well. Denies distress or discomfort at this time.

## 2022-10-02 NOTE — Progress Notes (Signed)
Adult Psychoeducational Group Note  Date:  10/02/2022 Time:  8:33 PM  Group Topic/Focus:   Wrap-Up Group:   The focus of this group is to help patients review their daily goal of treatment and discuss progress on daily workbooks. Participation Level:  Did Not Attend  Participation Quality:  Did Not Attend  Affect:  Did Not Attend  Cognitive:  Did Not Attend  Insight: Did Not Attend  Engagement in Group:  Did Not Attend  Modes of Intervention:  Did Not Attend  Additional Comments:   Pt was encouraged to attend wrap up group but refused   Gerhard Perches 10/02/2022, 8:33 PM

## 2022-10-03 DIAGNOSIS — F250 Schizoaffective disorder, bipolar type: Secondary | ICD-10-CM | POA: Diagnosis not present

## 2022-10-03 MED ORDER — BENZTROPINE MESYLATE 1 MG PO TABS
1.0000 mg | ORAL_TABLET | Freq: Two times a day (BID) | ORAL | Status: DC
  Administered 2022-10-03 – 2022-10-05 (×4): 1 mg via ORAL
  Filled 2022-10-03 (×9): qty 1

## 2022-10-03 NOTE — Group Note (Signed)
LCSW Group Therapy Note   Group Date: 10/03/2022 Start Time: 1300 End Time: 1400   Type of Therapy and Topic:  Group Therapy: Boundaries  Participation Level:  Minimal  Description of Group: This group will address the use of boundaries in their personal lives. Patients will explore why boundaries are important, the difference between healthy and unhealthy boundaries, and negative and postive outcomes of different boundaries and will look at how boundaries can be crossed.  Patients will be encouraged to identify current boundaries in their own lives and identify what kind of boundary is being set. Facilitators will guide patients in utilizing problem-solving interventions to address and correct types boundaries being used and to address when no boundary is being used. Understanding and applying boundaries will be explored and addressed for obtaining and maintaining a balanced life. Patients will be encouraged to explore ways to assertively make their boundaries and needs known to significant others in their lives, using other group members and facilitator for role play, support, and feedback.  Therapeutic Goals:  1.  Patient will identify areas in their life where setting clear boundaries could be  used to improve their life.  2.  Patient will identify signs/triggers that a boundary is not being respected. 3.  Patient will identify two ways to set boundaries in order to achieve balance in  their lives: 4.  Patient will demonstrate ability to communicate their needs and set boundaries  through discussion and/or role plays  Summary of Patient Progress:  Omeed was present/active throughout the session and proved open to feedback from Jackson and peers. Patient demonstrated fair insight into the subject matter, was respectful of peers, and was present. Patient left halfway through group and did not return.   Therapeutic Modalities:   Cognitive Behavioral Therapy Solution-Focused Therapy  Zachery Conch, LCSW 10/03/2022  1:46 PM

## 2022-10-03 NOTE — Progress Notes (Signed)
   10/03/22 2050  Psych Admission Type (Psych Patients Only)  Admission Status Voluntary  Psychosocial Assessment  Patient Complaints None  Eye Contact Fair  Facial Expression Other (Comment) (Appropriate to circumstance.)  Affect Appropriate to circumstance  Speech Logical/coherent  Interaction Forwards little (Improved)  Motor Activity Other (Comment) (WDL)  Appearance/Hygiene Unremarkable  Behavior Characteristics Cooperative;Appropriate to situation  Mood Pleasant  Aggressive Behavior  Effect No apparent injury  Thought Process  Coherency WDL  Content WDL  Delusions None reported or observed  Perception WDL  Hallucination None reported or observed  Judgment Limited  Confusion None  Danger to Self  Current suicidal ideation? Denies  Agreement Not to Harm Self Yes  Description of Agreement Verbal contract  Danger to Others  Danger to Others None reported or observed   Patient alert, oriented, appropriate to circumstance and pleasant. Patient denies SI, HI, AVH, and pain. Scheduled medications administered to patient, per provider orders. Support and encouragement provided. Routine safety checks conducted every 15 minutes. Patient contracts for safety and remains safe on the unit.

## 2022-10-03 NOTE — BHH Counselor (Signed)
B[facility]/BMU LCSW Progress Note   10/03/2022    11:30 AM  Zaelyn Ransier   [id number]   Type of Contact and Topic:  Collateral Contact and Discharge planning  CSW spoke with mother, Meserret, and provided updates for patient treatment progress.  Mother was in agreement with discharge plans of patient being on [facility] and being connected with an ACTT.  Mother agreed that she can pick patient up on Friday for discharge.        Signed:  Riki Altes MSW, LCSW, LCAS 10/03/2022 11:30 AM

## 2022-10-03 NOTE — Progress Notes (Addendum)
Visible in milieu with intermittent pacing but remains verbally redirectable and cooperative with care. Eye contact is fair with congruent affect, minimal but appropriate with soft, lucid speech with appropriate rate & rhythm when engaged. Denies SI, HI, AVH and pain when assessed. Reports he slept well last night with good appetite. Remains medication compliant without adverse drug reactions. Attends scheduled groups when prompted. Off unit with peers, returned without issues. Continued support, encouragement and reassurance provided to pt. Safety checks continues at Q 15 minutes intervals without issues thus far.

## 2022-10-03 NOTE — Progress Notes (Signed)
[facility] MD Progress Note  10/03/2022 4:30 PM Mario Thomas  MRN:  [id number] Principal Problem: Schizoaffective disorder, bipolar type (HCC) Diagnosis: Principal Problem:   Schizoaffective disorder, bipolar type (HCC) Active Problems:   Anxiety state   Insomnia  History of Present Illness: Mario Thomas is a 21 yo African American male who walked into this [facility] on 09/24/22 with complaints of suicidal and homicidal ideations. Pt was admitted voluntarily for treatment and stabilization of his mood.  Pt with prior hospitalizations at this [facility] from 11/09/21 through 11/17/2021 for manic type behaviors, and from 06/11/22 through 06/20/22 for bizarre behaviors and making threats to his parents.    24 hr chart review: HR elevated at 114 earlier today morning and BP was WNL. Pt is continuing to be compliant with his scheduled medications, and has not required any PRN medication in the past 48 hrs. Pt has attended some unit group activities in the past 24 hrs.   Today's patient assessment note: Pt denies SI/HI/AVH, denies paranoia, and there is no evidence of delusional thinking. Pt remains with flat affect and depressed mood, but reports that his mood has improved since hospitalization.  He is rationale, and gives an accurate history of present illness.  Attention to personal hygiene and grooming is fair, eye contact is good, speech is clear & coherent. Thought contents are organized and logical.  Pt reports a good sleep quality last night, reports a good appetite, denies being in any physical distress. He denies any medication related side effects, and mild tremors of the tongue found on assessment, but his Cogentin was increased to 1 mg BID yesterday. We are continuing current medications as listed below. AIMS:1. Tentative discharge date for pt is 10/05/22 if all discharge f/u appts are completed. Attending Psychiatrist has made multiple attempts to reach mother, but was unsuccessful to. Staff will continue to make  attempts to reach mother.   Total Time spent with patient: 30 minutes  Past Psychiatric History: psychosis  Past Medical History:  Past Medical History:  Diagnosis Date   Heart murmur    MDD (major depressive disorder), severe (HCC) 08/23/2018   History reviewed. No pertinent surgical history. Family History:  Family History  Problem Relation Age of Onset   Hyperlipidemia Father    Family Psychiatric  History: non reported Social History:  Social History   Substance and Sexual Activity  Alcohol Use Not Currently     Social History   Substance and Sexual Activity  Drug Use Never   Comment: Denies    Social History   Socioeconomic History   Marital status: Single    Spouse name: Not on file   Number of children: Not on file   Years of education: Not on file   Highest education level: Not on file  Occupational History   Not on file  Tobacco Use   Smoking status: Never   Smokeless tobacco: Never  Vaping Use   Vaping Use: Never used  Substance and Sexual Activity   Alcohol use: Not Currently   Drug use: Never    Comment: Denies   Sexual activity: Never  Other Topics Concern   Not on file  Social History Narrative   01/23/21   From: the area   Living: with parents   Work: Actor   School: Actuary at [employer]: good relationship with parents, 1 sibling      Enjoys: running      Exercise: daily running  Diet: meat, veggies, grains      Safety   Seat belts: Yes    Guns: No   Safe in relationships: Yes    Social Determinants of Health   Financial Resource Strain: Not on file  Food Insecurity: No Food Insecurity (09/24/2022)   Hunger Vital Sign    Worried About Running Out of Food in the Last Year: Never true    Ran Out of Food in the Last Year: Never true  Transportation Needs: No Transportation Needs (09/24/2022)   PRAPARE - Hydrologist (Medical): No    Lack of Transportation (Non-Medical):  No  Physical Activity: Not on file  Stress: Not on file  Social Connections: Not on file   Additional Social History:     Sleep: Good  Appetite:  Good  Current Medications: Current Facility-Administered Medications  Medication Dose Route Frequency Provider Last Rate Last Admin   acetaminophen (TYLENOL) tablet 650 mg  650 mg Oral Q6H PRN Ntuen, Kris Hartmann, FNP   650 mg at 09/27/22 0754   alum & mag hydroxide-simeth (MAALOX/MYLANTA) 200-200-20 MG/5ML suspension 30 mL  30 mL Oral Q4H PRN Ntuen, Kris Hartmann, FNP       benztropine (COGENTIN) tablet 1 mg  1 mg Oral BID Nicholes Rough, NP       hydrOXYzine (ATARAX) tablet 25 mg  25 mg Oral TID PRN Laretta Bolster, FNP   25 mg at 09/30/22 1446   OLANZapine zydis (ZYPREXA) disintegrating tablet 5 mg  5 mg Oral Q8H PRN Nicholes Rough, NP   5 mg at 09/28/22 1033   And   LORazepam (ATIVAN) tablet 1 mg  1 mg Oral PRN Nicholes Rough, NP       And   ziprasidone (GEODON) injection 20 mg  20 mg Intramuscular PRN Jakyron Fabro, NP       magnesium hydroxide (MILK OF MAGNESIA) suspension 30 mL  30 mL Oral Daily PRN Ntuen, Kris Hartmann, FNP       propranolol (INDERAL) tablet 10 mg  10 mg Oral BID Nicholes Rough, NP   10 mg at 10/03/22 0804   risperiDONE (RISPERDAL M-TABS) disintegrating tablet 4 mg  4 mg Oral BID Nicholes Rough, NP   4 mg at 10/03/22 0804   risperiDONE microspheres (RISPERDAL CONSTA) injection 25 mg  25 mg Intramuscular Q14 Days Mal Asher, Tamela Oddi, NP   25 mg at 10/03/22 1130   traZODone (DESYREL) tablet 50 mg  50 mg Oral QHS Nicholes Rough, NP   50 mg at 10/02/22 2038   Lab Results:  No results found for this or any previous visit (from the past 48 hour(s)). Blood Alcohol level:  Lab Results  Component Value Date   ETH <10 09/09/2022   ETH <10 81/85/6314    Metabolic Disorder Labs: Lab Results  Component Value Date   HGBA1C 4.8 09/25/2022   MPG 91.06 09/25/2022   MPG 93.93 05/28/2022   Lab Results  Component Value Date   PROLACTIN 10.0  08/24/2018   Lab Results  Component Value Date   CHOL 122 09/25/2022   TRIG 27 09/25/2022   HDL 42 09/25/2022   CHOLHDL 2.9 09/25/2022   VLDL 5 09/25/2022   LDLCALC 75 09/25/2022   LDLCALC 41 05/28/2022   Physical Findings: AIMS: 0 CIWA: n/a   COWS: n/a  Musculoskeletal: Strength & Muscle Tone: within normal limits Gait & Station: normal Patient leans: N/A  Psychiatric Specialty Exam:  Presentation  General Appearance:  Appropriate for  Environment; Fairly Groomed  Eye Contact: Good  Speech: Clear and Coherent  Speech Volume: Normal  Handedness: Right  Mood and Affect  Mood: Depressed  Affect: Congruent  Thought Process  Thought Processes: Coherent  Descriptions of Associations:Intact  Orientation:Full (Time, Place and Person)  Thought Content:Logical  History of Schizophrenia/Schizoaffective disorder:Yes  Duration of Psychotic Symptoms:Greater than six months  Hallucinations:Hallucinations: None  Ideas of Reference:None  Suicidal Thoughts:Suicidal Thoughts: No  Homicidal Thoughts:Homicidal Thoughts: No  Sensorium  Memory: Immediate Good  Judgment: Fair  Insight: Good  Executive Functions  Concentration: Good  Attention Span: Good  Recall: Good  Fund of Knowledge: Good  Language: Good  Psychomotor Activity  Psychomotor Activity: Psychomotor Activity: Normal  Assets  Assets: Communication Skills  Sleep  Sleep: Sleep: Good  Physical Exam: Physical Exam Constitutional:      Appearance: Normal appearance.  HENT:     Head: Normocephalic.     Nose: No congestion or rhinorrhea.  Eyes:     Pupils: Pupils are equal, round, and reactive to light.  Pulmonary:     Effort: Pulmonary effort is normal.  Musculoskeletal:        General: Normal range of motion.     Cervical back: Normal range of motion.  Neurological:     Mental Status: He is alert and oriented to person, place, and time.    Review of Systems   Constitutional: Negative.   HENT: Negative.    Eyes: Negative.   Respiratory:  Negative for cough.   Cardiovascular:  Negative for chest pain.  Gastrointestinal: Negative.  Negative for heartburn.  Genitourinary: Negative.   Musculoskeletal: Negative.   Skin: Negative.   Neurological: Negative.   Psychiatric/Behavioral:  Positive for depression. Negative for hallucinations, memory loss, substance abuse and suicidal ideas. The patient is nervous/anxious and has insomnia.    Blood pressure 129/75, pulse (!) 114, temperature 98 F (36.7 C), temperature source Oral, resp. rate 16, height 5\' 10"  (1.778 m), weight 97.1 kg, SpO2 100 %. Body mass index is 30.71 kg/m.  Treatment Plan Summary: Daily contact with patient to assess and evaluate symptoms and progress in treatment and Medication management   Observation Level/Precautions:  15 minute checks  Laboratory:  Labs reviewed   Psychotherapy:  Unit Group sessions  Medications:  See [facility]  Consultations:  To be determined   Discharge Concerns:  Safety, medication compliance, mood stability  Estimated LOS: 5-7 days  Other:  N/A     PLAN Safety and Monitoring: Voluntary admission to inpatient psychiatric unit for safety, stabilization and treatment Daily contact with patient to assess and evaluate symptoms and progress in treatment Patient's case to be discussed in multi-disciplinary team meeting Observation Level : q15 minute checks Vital signs: q12 hours Precautions: Safety   Long Term Goal(s): Improvement in symptoms so as ready for discharge   Short Term Goals: Ability to identify changes in lifestyle to reduce recurrence of condition will improve, Ability to disclose and discuss suicidal ideas, Ability to demonstrate self-control will improve, Ability to identify and develop effective coping behaviors will improve, Compliance with prescribed medications will improve, and Ability to identify triggers associated with substance  abuse/mental health issues will improve   Diagnoses  Active Problems:   Anxiety state   Insomnia   Paranoid schizophrenia (HCC)   Medications -Given Risperdal Consta inj IM 25 mg on 10/24 -Given Risperdal Consta IM 25 mg on 10/25 -Previously discontinued Invega 12 mg nightly for psychosis on 10/19 d/t ineffectiveness. 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  -Continue Risperdal to 4 mg BID for psychosis (Educated on rationales, benefits & possible side effects of medication, including the risk of metabolic syndrome and prolonged Qtc. Verbalized understanding, but will need reinforcement of education once psychosis clears. Repeat EKG ordered for Qtc tracing & no changes from previous EKGs as per Cardiology)  -Continue Inderal 10 mg BID for anxiety and tachycardia -Continue Trazodone 50 mg nightly PRN for insomnia -Continue Hydroxyzine 25 mg TID PRN for anxiety -Continue Agitation protocol (Zyprexa, Ativan, Geodon PRN)-See MAR   Other PRNS -Continue Tylenol 650 mg every 6 hours PRN for mild pain -Continue Maalox 30 mg every 4 hrs PRN for indigestion -Continue Milk of Magnesia as needed every 6 hrs for constipation   Discharge Planning: Social work and case management to assist with discharge planning and identification of hospital follow-up needs prior to discharge Estimated LOS: 5-7 days Discharge Concerns: Need to establish a safety plan; Medication compliance and effectiveness Discharge Goals: Return home with outpatient referrals for mental health follow-up including medication management/psychotherapy   I certify that inpatient services furnished can reasonably be expected to improve the patient's condition.   Starleen Blue, NP 10/03/2022, 4:30 PMPatient ID: Mario Thomas, male   DOB: [birth year], 21 y.o.   MRN: [id number] Patient ID: Kortez Murtagh, male   DOB: 09-23-2001, Patient ID: Mario Thomas, male   DOB: 2001-06-28, 21 y.o.

## 2022-10-04 DIAGNOSIS — F250 Schizoaffective disorder, bipolar type: Secondary | ICD-10-CM | POA: Diagnosis not present

## 2022-10-04 NOTE — Progress Notes (Signed)
[facility] MD Progress Note  10/04/2022 2:03 PM Mario Thomas  MRN:  [id number] Principal Problem: Schizoaffective disorder, bipolar type ([place]) Diagnosis: Principal Problem:   Schizoaffective disorder, bipolar type ([place]) Active Problems:   Anxiety state   Insomnia  History of Present Illness:  Mario Thomas is a 21 yo [place] American male with past psychiatric history of bipolar disorder with psychotic features, schizoaffective disorder, paranoid schizophrenia, multiple inpatient psychiatric hospitalizations who presented as walked in at [place] 09/24/22 with chief complaint of suicidal and homicidal ideations. Most recently hospitalized at [facility] 07/03-12/23 for aggressive, bizarre behaviors towards his parents. Pt was admitted voluntarily for treatment and stabilization of his mood.  24 hr chart review: HR remains elevated at 112 earlier in the morning and BP remains WNL. Per chart review patient slept a total of 10.5 hours and is medication compliant with no PRN medication in the past 48 hrs. Pt intermittently visible in milieu noted to attend fewer unit group activities.   Today's patient assessment note: Pt presents pacing hallway. He is alert and oriented, calm and cooperative. He engages appropriately in assessment. Euthymic mood. Flat affect. Good eye contact. Normal speech. Casual appearance. He reports "doing well" and mood "normal". He reports depression as 0/10 and denies any SI/HI/AVH or paranoia. He appears guarded and preoccupied when asked about hearing any voices or active delusions and asks "can I go home now?". He continues to deny any medication related side effects; no tremor or involuntary movements noted. Cogentin recently increased to 1 mg BID. Current medications noted below. Tentative discharge date for pt is 10/05/22 if all discharge f/u appts are completed.   Most recent medication trials:  - Received:  - Risperdal Consta inj IM 25 mg on 10/24 - Risperdal Consta IM 25 mg on  10/25 -Discontinued Invega 12 mg nightly for psychosis on 10/19 d/t ineffectiveness. 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  -Currently on Risperdal to 4 mg BID for psychosis (Educated on rationales, benefits & possible side effects of medication, including the risk of metabolic syndrome and prolonged Qtc. Verbalized understanding, but will need reinforcement of education once psychosis clears. Repeat EKG ordered for Qtc tracing & no changes from previous EKGs as per Cardiology)  Total Time spent with patient: 30 minutes  Past Psychiatric History: psychosis, schizoaffective disorder bipolar type, anxiety, alcohol use  Past Medical History:  Past Medical History:  Diagnosis Date   Heart murmur    MDD (major depressive disorder), severe ([place]) 08/23/2018   History reviewed. No pertinent surgical history. Family History:  Family History  Problem Relation Age of Onset   Hyperlipidemia Father    Family Psychiatric  History: non reported Social History:  Social History   Substance and Sexual Activity  Alcohol Use Not Currently     Social History   Substance and Sexual Activity  Drug Use Never   Comment: Denies    Social History   Socioeconomic History   Marital status: Single    Spouse name: Not on file   Number of children: Not on file   Years of education: Not on file   Highest education level: Not on file  Occupational History   Not on file  Tobacco Use   Smoking status: Never   Smokeless tobacco: Never  Vaping Use   Vaping Use: Never used  Substance and Sexual Activity   Alcohol use: Not Currently   Drug use: Never    Comment: Denies   Sexual activity: Never  Other Topics Concern  Not on file  Social History Narrative   01/23/21   From: the area   Living: with parents   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, 1 sibling      Enjoys: running      Exercise: daily running    Diet: meat,  veggies, grains      Safety   Seat belts: Yes    Guns: No   Safe in relationships: Yes    Social Determinants of Corporate investment banker Strain: Not on file  Food Insecurity: No Food Insecurity (09/24/2022)   Hunger Vital Sign    Worried About Running Out of Food in the Last Year: Never true    Ran Out of Food in the Last Year: Never true  Transportation Needs: No Transportation Needs (09/24/2022)   PRAPARE - Administrator, Civil Service (Medical): No    Lack of Transportation (Non-Medical): No  Physical Activity: Not on file  Stress: Not on file  Social Connections: Not on file   Additional Social History:     Sleep: Good  Appetite:  Good  Current Medications: Current Facility-Administered Medications  Medication Dose Route Frequency Provider Last Rate Last Admin   acetaminophen (TYLENOL) tablet 650 mg  650 mg Oral Q6H PRN Cecilie Lowers, Mario Thomas   650 mg at 09/27/22 0754   alum & mag hydroxide-simeth (MAALOX/MYLANTA) 200-200-20 MG/5ML suspension 30 mL  30 mL Oral Q4H PRN Ntuen, Jesusita Oka, Mario Thomas       benztropine (COGENTIN) tablet 1 mg  1 mg Oral BID Starleen Blue, NP   1 mg at 10/04/22 0756   hydrOXYzine (ATARAX) tablet 25 mg  25 mg Oral TID PRN Cecilie Lowers, Mario Thomas   25 mg at 09/30/22 1446   OLANZapine zydis (ZYPREXA) disintegrating tablet 5 mg  5 mg Oral Q8H PRN Starleen Blue, NP   5 mg at 09/28/22 1033   And   LORazepam (ATIVAN) tablet 1 mg  1 mg Oral PRN Starleen Blue, NP       And   ziprasidone (GEODON) injection 20 mg  20 mg Intramuscular PRN Nkwenti, Doris, NP       magnesium hydroxide (MILK OF MAGNESIA) suspension 30 mL  30 mL Oral Daily PRN Ntuen, Jesusita Oka, Mario Thomas       propranolol (INDERAL) tablet 10 mg  10 mg Oral BID Starleen Blue, NP   10 mg at 10/04/22 0757   risperiDONE (RISPERDAL M-TABS) disintegrating tablet 4 mg  4 mg Oral BID Starleen Blue, NP   4 mg at 10/04/22 0757   risperiDONE microspheres (RISPERDAL CONSTA) injection 25 mg  25 mg Intramuscular  Q14 Days Nkwenti, Doris, NP   25 mg at 10/03/22 1130   traZODone (DESYREL) tablet 50 mg  50 mg Oral QHS Nkwenti, Doris, NP   50 mg at 10/03/22 2057   Lab Results:  No results found for this or any previous visit (from the past 48 hour(s)). Blood Alcohol level:  Lab Results  Component Value Date   [facility] <10 09/09/2022   ETH <10 08/18/2022    Metabolic Disorder Labs: Lab Results  Component Value Date   HGBA1C 4.8 09/25/2022   MPG 91.06 09/25/2022   MPG 93.93 05/28/2022   Lab Results  Component Value Date   PROLACTIN 10.0 08/24/2018   Lab Results  Component Value Date   CHOL 122 09/25/2022   TRIG 27 09/25/2022  HDL 42 09/25/2022   CHOLHDL 2.9 09/25/2022   VLDL 5 09/25/2022   LDLCALC 75 09/25/2022   LDLCALC 41 05/28/2022   Physical Findings: AIMS: 0 CIWA: n/a   COWS: n/a  Musculoskeletal: Strength & Muscle Tone: within normal limits Gait & Station: normal Patient leans: N/A  Psychiatric Specialty Exam:  Presentation  General Appearance:  Appropriate for Environment; Fairly Groomed  Eye Contact: Good  Speech: Clear and Coherent  Speech Volume: Normal  Handedness: Right  Mood and Affect  Mood: Euthymic  Affect: Congruent  Thought Process  Thought Processes: Linear  Descriptions of Associations:Intact  Orientation:Full (Time, Place and Person)  Thought Content:Logical  History of Schizophrenia/Schizoaffective disorder:Yes  Duration of Psychotic Symptoms:Greater than six months  Hallucinations:Hallucinations: None  Ideas of Reference:None  Suicidal Thoughts:Suicidal Thoughts: No  Homicidal Thoughts:Homicidal Thoughts: No  Sensorium  Memory: Immediate Fair; Recent Fair  Judgment: Fair  Insight: Fair  Art therapist  Concentration: Fair  Attention Span: Fair  Recall: Fair  Fund of Knowledge: Good  Language: Good  Psychomotor Activity  Psychomotor Activity: Psychomotor Activity: Normal (pacing  unit)  Assets  Assets: Resilience; Social Support; Physical Health; Housing; Communication Skills  Sleep  Sleep: Sleep: Good Number of Hours of Sleep: 10.5  Physical Exam: Physical Exam Constitutional:      Appearance: Normal appearance.  HENT:     Head: Normocephalic.     Nose: No congestion or rhinorrhea.  Eyes:     Pupils: Pupils are equal, round, and reactive to light.  Pulmonary:     Effort: Pulmonary effort is normal.  Musculoskeletal:        General: Normal range of motion.     Cervical back: Normal range of motion.  Neurological:     Mental Status: He is alert and oriented to person, place, and time.    Review of Systems  Constitutional: Negative.   HENT: Negative.    Eyes: Negative.   Respiratory:  Negative for cough.   Cardiovascular:  Negative for chest pain.  Gastrointestinal: Negative.  Negative for heartburn.  Genitourinary: Negative.   Musculoskeletal: Negative.   Skin: Negative.   Neurological: Negative.   Psychiatric/Behavioral:  Positive for depression. Negative for hallucinations, memory loss, substance abuse and suicidal ideas. The patient is nervous/anxious and has insomnia.    Blood pressure 112/69, pulse (!) 112, temperature 98.1 F (36.7 C), temperature source Oral, resp. rate 16, height 5\' 10"  (1.778 m), weight 97.1 kg, SpO2 99 %. Body mass index is 30.71 kg/m.  Treatment Plan Summary: Daily contact with patient to assess and evaluate symptoms and progress in treatment and Medication management   Observation Level/Precautions:  15 minute checks  Laboratory:  Labs reviewed; EKG ordered 10/04/22  Psychotherapy:  Unit Group sessions  Medications:  See [facility]  Consultations:  To be determined   Discharge Concerns:  Safety, medication compliance, mood stability  Estimated LOS: 5-7 days  Other:  N/A     PLAN Safety and Monitoring: Voluntary admission to inpatient psychiatric unit for safety, stabilization and treatment Daily contact with  patient to assess and evaluate symptoms and progress in treatment Patient's case to be discussed in multi-disciplinary team meeting Observation Level : q15 minute checks Vital signs: q12 hours Precautions: Safety   Long Term Goal(s): Improvement in symptoms so as ready for discharge   Short Term Goals: Ability to identify changes in lifestyle to reduce recurrence of condition will improve, Ability to disclose and discuss suicidal ideas, Ability to demonstrate self-control will improve, Ability to  identify and develop effective coping behaviors will improve, Compliance with prescribed medications will improve, and Ability to identify triggers associated with substance abuse/mental health issues will improve   Diagnoses:  Principal Problem:   Schizoaffective disorder, bipolar type (HCC) Active Problems:   Anxiety state   Insomnia  Schizoaffective disorder, bipolar type (HCC):   - Received:  - Risperdal Consta inj IM 25 mg on 10/24 -Continue: - Risperdal to 4 mg BID for psychosis (Educated on rationales, benefits & possible side effects of medication, including the risk of metabolic syndrome and prolonged Qtc. Verbalized understanding, but will need reinforcement of education once psychosis clears. Repeat EKG ordered for Qtc tracing & no changes from previous EKGs as per Cardiology)  -EKG ordered 10/04/22 Anxiety:  -Continue:  - Inderal 10 mg BID for anxiety and tachycardia - Hydroxyzine 25 mg TID PRN for anxiety  Insomnia:  -Continue:  - Trazodone 50 mg nightly PRN for insomnia  Agitation protocol:  - Continue:  - (Zyprexa, Ativan, Geodon PRN)-See MAR   Other PRNS -Continue Tylenol 650 mg every 6 hours PRN for mild pain -Continue Maalox 30 mg every 4 hrs PRN for indigestion -Continue Milk of Magnesia as needed every 6 hrs for constipation   Discharge Planning: Social work and case management to assist with discharge planning and identification of hospital follow-up needs prior  to discharge Estimated LOS: 5-7 days Discharge Concerns: Need to establish a safety plan; Medication compliance and effectiveness Discharge Goals: Return home with outpatient referrals for mental health follow-up including medication management/psychotherapy   I certify that inpatient services furnished can reasonably be expected to improve the patient's condition.   Mario Parish, NP 10/04/2022, 2:03 PMPatient ID: Mario Thomas, male   DOB: 10-12-2001, 21 y.o.   MRN: [id number] Patient ID: Mario Thomas, male   DOB: 2001-06-12, Patient ID: Mario Thomas, male   DOB: October 21, 2001, 21 y.o. Patient ID: Mario Thomas, male   DOB: 23-Jan-2001, 21 y.o.   MRN: [id number]

## 2022-10-04 NOTE — B[facility] Counselor (Signed)
[facility] LCSW Progress Note   10/04/2022    11:45 AM  Mario Thomas   [id number]   Type of Contact and Topic:   ACTT assessment  Psychotherapeutic Services team lead, Abby, came to assess patient.  Abby states that patient consented to treatment and she will be out to see him at his mothers place on Monday unless changes with discharge happen.  CSW agreed to notify Abby of any changes of discharge.     Signed:  Riki Altes MSW, LCSW, LCAS 10/04/2022 11:45 AM

## 2022-10-04 NOTE — Progress Notes (Signed)
   10/04/22 0900  Psych Admission Type (Psych Patients Only)  Admission Status Voluntary  Psychosocial Assessment  Patient Complaints None  Eye Contact Fair  Facial Expression Other (Comment) (Appropriate to circumstance)  Affect Appropriate to circumstance  Speech Logical/coherent  Interaction Assertive;Cautious  Motor Activity Other (Comment) (WDL)  Appearance/Hygiene Unremarkable  Behavior Characteristics Cooperative  Mood Pleasant  Aggressive Behavior  Effect No apparent injury  Thought Process  Coherency WDL  Content WDL  Delusions None reported or observed  Perception WDL  Hallucination None reported or observed  Judgment WDL  Confusion None  Danger to Self  Current suicidal ideation? Denies  Agreement Not to Harm Self Yes  Description of Agreement Verbal  Danger to Others  Danger to Others None reported or observed

## 2022-10-05 DIAGNOSIS — F250 Schizoaffective disorder, bipolar type: Secondary | ICD-10-CM | POA: Diagnosis not present

## 2022-10-05 MED ORDER — PROPRANOLOL HCL 10 MG PO TABS: 10.0000 mg | ORAL_TABLET | Freq: Two times a day (BID) | ORAL | 0 refills | Status: DC

## 2022-10-05 MED ORDER — RISPERIDONE 4 MG PO TBDP: 4.0000 mg | ORAL_TABLET | Freq: Two times a day (BID) | ORAL | 0 refills | Status: DC

## 2022-10-05 MED ORDER — TRAZODONE HCL 50 MG PO TABS: 50.0000 mg | ORAL_TABLET | Freq: Every day | ORAL | 0 refills | Status: DC

## 2022-10-05 MED ORDER — BENZTROPINE MESYLATE 1 MG PO TABS: 1.0000 mg | ORAL_TABLET | Freq: Two times a day (BID) | ORAL | 0 refills | Status: DC

## 2022-10-05 MED ORDER — RISPERIDONE MICROSPHERES ER 25 MG IM SRER: 25.0000 mg | INTRAMUSCULAR | 0 refills | Status: DC

## 2022-10-05 MED ORDER — HYDROXYZINE HCL 25 MG PO TABS: 25.0000 mg | ORAL_TABLET | Freq: Three times a day (TID) | ORAL | 0 refills | Status: DC | PRN

## 2022-10-05 NOTE — Progress Notes (Signed)
  [facility] Adult Case Management Discharge Plan :  Will you be returning to the same living situation after discharge:  Yes,  home with mother  At discharge, do you have transportation home?: Yes,  mother will pick patient up Do you have the ability to pay for your medications: Yes,  patient assistance and insurance  Release of information consent forms completed and in the chart;  Patient's signature needed at discharge.  Patient to Follow up at:  Follow-up [facility] Follow up.   Why: You have been connected to ACTT services through this agency.  You have already participated in initial assessment.  Counselor will meet with you at your house on Monday, October 30th, for wrap around mental health care. Contact information: [place] [address] [phone]                 Next level of care provider has access to [facility] and Suicide Prevention discussed: Yes,  mother, [place]     Has patient been referred to the Quitline?: N/A patient is not a smoker  Patient has been referred for addiction treatment: [place], LCSW 10/05/2022, 10:09 AM

## 2022-10-05 NOTE — Progress Notes (Signed)
No acute events overnight. Med compliant.    10/04/22 2300  Psych Admission Type (Psych Patients Only)  Admission Status Voluntary  Psychosocial Assessment  Patient Complaints None  Eye Contact Fair  Facial Expression Flat  Affect Appropriate to circumstance  Speech Logical/coherent  Interaction Minimal  Motor Activity Other (Comment) (WNL)  Appearance/Hygiene Unremarkable  Behavior Characteristics Cooperative  Mood Pleasant  Aggressive Behavior  Effect No apparent injury  Thought Process  Coherency WDL  Content WDL  Delusions None reported or observed  Perception WDL  Hallucination None reported or observed  Judgment WDL  Confusion None  Danger to Self  Current suicidal ideation? Denies  Agreement Not to Harm Self Yes  Description of Agreement Verbal  Danger to Others  Danger to Others None reported or observed

## 2022-10-05 NOTE — Progress Notes (Signed)
D: Pt A & O X 3. Denies SI, HI, AVH and pain at this time. D/C home as ordered. Picked up in lobby by his mother.  A: D/C instructions reviewed with pt including prescriptions and follow up appointments; compliance encouraged. All belongings (1 pair of shoes) from locker 13 given to pt at time of departure. Scheduled given with verbal education and effects monitored. Safety checks maintained without incident till time of d/c.  R: Pt receptive to care. Compliant with medications when offered. Denies adverse drug reactions when assessed. Verbalized understanding related to d/c instructions. Signed belonging sheet in agreement with items received from locker. Ambulatory with a steady gait. Appears to be in no physical distress at time of departure.

## 2022-10-05 NOTE — [facility] Suicide Risk Assessment (Addendum)
Suicide Risk Assessment  Discharge Assessment    [facility] Discharge Suicide Risk Assessment   Principal Problem: Schizoaffective disorder, bipolar type [facility]) Discharge Diagnoses: Principal Problem:   Schizoaffective disorder, bipolar type (HCC) Active Problems:   Anxiety state   Insomnia   Reason For Admission: Mario Thomas is a 21 yo African American male who walked into this [facility] on 09/24/22 with complaints of suicidal and homicidal ideations. Pt was admitted voluntarily for treatment and stabilization of his mood.  Pt with prior hospitalizations at this [facility] from 11/09/21 through 11/17/2021 for manic type behaviors, and from 06/11/22 through 06/20/22 for bizarre behaviors and making threats to his parents.                                              HOSPITAL COURSE During the patient's hospitalization, patient had extensive initial psychiatric evaluation, and follow-up psychiatric evaluations every day.  Psychiatric diagnoses provided upon initial assessment were: Principal Problem:   Schizoaffective disorder, bipolar type (HCC) Active Problems:   Anxiety state   Insomnia  Patient's psychiatric medications were adjusted on admission as follows: -Start Invega 12 mg nightly for psychosis -Start Trazodone 50 mg nightly PRN for insomnia -Start Hydroxyzine 25 mg TID PRN for anxiety -Start Agitation protocol (Zyprexa, Ativan, Geodon PRN)-See MAR  During the hospitalization, other adjustments were made to the patient's psychiatric medication regimen, with medications at discharge being as follows: -Given Risperdal Consta inj IM 25 mg on 10/24 -Given Risperdal Consta IM 25 mg on 10/25 -Give next dose of Risperdal Consta 50 mg 10/17/2022 -Continue Risperdal to 4 mg BID for psychosis x 3 weeks, then stop and continue with Long acting injectable Risperdal 50 mg every 2 weeks. -Continue Inderal 10 mg BID for anxiety and tachycardia -Continue Trazodone 50 mg nightly PRN for insomnia -Continue  Hydroxyzine 25 mg TID PRN for anxiety -Previously discontinued Invega 12 mg nightly for psychosis on 10/19 d/t ineffectiveness. 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  Patient's care was discussed during the interdisciplinary team meeting every day during the hospitalization. The patient denies having side effects to prescribed psychiatric medication. Gradually, patient started adjusting to milieu. The patient was evaluated each day by a clinical provider to ascertain response to treatment. Improvement was noted by the patient's report of decreasing symptoms, improved sleep and appetite, affect, medication tolerance, behavior, and participation in unit programming.  Patient was asked each day to complete a self inventory noting mood, mental status, pain, new symptoms, anxiety and concerns.    Symptoms were reported as significantly decreased or resolved completely by discharge. On day of discharge, the patient reports that their mood is stable. The patient denied having suicidal thoughts for more than 48 hours prior to discharge.  Patient denies having homicidal thoughts.  Patient denies having auditory hallucinations.  Patient denies any visual hallucinations or other symptoms of psychosis. The patient was motivated to continue taking medication with a goal of continued improvement in mental health.   The patient reports their target psychiatric symptoms of psychosis, insomnia & anxiety responded well to the psychiatric medications, and the patient reports overall benefit from this psychiatric hospitalization. Supportive psychotherapy was provided to the patient. The patient also participated in regular group therapy while hospitalized. Coping skills, problem solving as well as relaxation therapies were also part of the unit programming.  Labs were reviewed with the patient, and abnormal  results were discussed with the patient.  Total Time spent with patient: 30 minutes  Musculoskeletal: Strength &  Muscle Tone: within normal limits Gait & Station: normal Patient leans: N/A  Psychiatric Specialty Exam  Presentation  General Appearance:  Appropriate for Environment; Fairly Groomed  Eye Contact: Good  Speech: Clear and Coherent  Speech Volume: Normal  Handedness: Right  Mood and Affect  Mood: Euthymic  Duration of Depression Symptoms: Less than two weeks  Affect: Appropriate; Congruent  Thought Process  Thought Processes: Coherent  Descriptions of Associations:Intact  Orientation:Full (Time, Place and Person)  Thought Content:Logical  History of Schizophrenia/Schizoaffective disorder:Yes  Duration of Psychotic Symptoms:Greater than six months  Hallucinations:Hallucinations: None  Ideas of Reference:None  Suicidal Thoughts:Suicidal Thoughts: No  Homicidal Thoughts:Homicidal Thoughts: No   Sensorium  Memory: Immediate Good  Judgment: Fair  Insight: Fair   Art therapist  Concentration: Good  Attention Span: Good  Recall: Good  Fund of Knowledge: Good  Language: Good   Psychomotor Activity  Psychomotor Activity: Psychomotor Activity: Normal   Assets  Assets: Communication Skills; Housing; Social Support   Sleep  Sleep: Sleep: Good Number of Hours of Sleep: 10.5   Physical Exam: Physical Exam Constitutional:      Appearance: Normal appearance.  HENT:     Head: Normocephalic.     Nose: Nose normal. No congestion or rhinorrhea.  Eyes:     Pupils: Pupils are equal, round, and reactive to light.  Musculoskeletal:     Cervical back: Normal range of motion.  Neurological:     Mental Status: He is alert and oriented to person, place, and time.     Sensory: No sensory deficit.    Review of Systems  Constitutional:  Negative for fever.  HENT:  Negative for hearing loss and sore throat.   Eyes: Negative.   Respiratory:  Negative for cough.   Cardiovascular:  Negative for chest pain.  Gastrointestinal:   Negative for heartburn.  Skin: Negative.   Neurological: Negative.   Psychiatric/Behavioral:  Negative for depression, hallucinations, memory loss, substance abuse and suicidal ideas. The patient is not nervous/anxious and does not have insomnia.    Blood pressure 128/73, pulse 80, temperature 98.5 F (36.9 C), temperature source Oral, resp. rate 16, height 5\' 10"  (1.778 m), weight 97.1 kg, SpO2 100 %. Body mass index is 30.71 kg/m.  Mental Status Per Nursing Assessment::   On Admission:  Suicidal ideation indicated by patient, Thoughts of violence towards others, Plan includes specific time, place, or method, Belief that plan would result in death  Demographic Factors:  Male, Adolescent or young adult, and Unemployed  Loss Factors: NA  Historical Factors: NA  Risk Reduction Factors:   Living with another person, especially a relative and Positive social support  Continued Clinical Symptoms:  P't psychosis has resolved. He denies SI, denies HI, denies AVH, denies paranoia and there is no evidence of delusional thinking. Pt verbally contracts for safety outside of this [facility].  Cognitive Features That Contribute To Risk:  None    Suicide Risk:  Mild:  There are no identifiable suicide plans, no associated intent, mild dysphoria and related symptoms, good self-control (both objective and subjective assessment), few other risk factors, and identifiable protective factors, including available and accessible social support.    Follow-up Information     [facility] Follow up.   Why: You have been connected to ACTT services through this agency.  You have already participated in initial assessment.  Counselor will  meet with you at your house on Monday, October 30th, for wrap around mental health care. Contact information: [place] [address] [phone]                Plan Of Care/Follow-up recommendations:  The patient is able to  verbalize their individual safety plan to this provider.  # It is recommended to the patient to continue psychiatric medications as prescribed, after discharge from the hospital.    # It is recommended to the patient to follow up with your outpatient psychiatric provider and PCP.  # It was discussed with the patient, the impact of alcohol, drugs, tobacco have been there overall psychiatric and medical wellbeing, and total abstinence from substance use was recommended the patient.ed.  # Prescriptions provided or sent directly to preferred pharmacy at discharge. Patient agreeable to plan. Given opportunity to ask questions. Appears to feel comfortable with discharge.    # In the event of worsening symptoms, the patient is instructed to call the crisis hotline (988), 911 and or go to the nearest ED for appropriate evaluation and treatment of symptoms. To follow-up with primary care provider for other medical issues, concerns and or health care needs  # Patient was discharged home to parent's home with a plan to follow up as noted above.   Nicholes Rough, NP 10/05/2022, 12:57 PM

## 2022-10-05 NOTE — Group Note (Signed)
LCSW Group Therapy  Type of Therapy and Topic:  Group Therapy: Thoughts, Feelings, and Actions  Participation Level:  Minimal   Description of Group:   In this group, each patient discussed their previous experiencing and understanding of overthinking, identifying the harmful impact on their lives. As a group, each patient was introduced to the basic concepts of Cognitive Behavioral Therapy: that thoughts, feelings, and actions are all connected and influence one another. They were given examples of how overthinking can affect our feelings, actions, and vise versa. The group was then asked to analyze how overthinking was harmful and brainstorm alternative thinking patterns/reactions to the example situation. Then, each group member filled out and identified their own example situation in which a problem situation caused their thoughts, feelings, and actions to be negatively impacted; they were asked to come up with 3 new (more adaptive/positive) thoughts that led to 3 new feelings and actions.  Therapeutic Goals: Patients will review and discuss their past experience with overthinking. Patients will learn the basics of the CBT model through group-led examples.. Patients will identify situations where they may have negative thoughts, feelings, or actions and will then reframe the situation using more positive thoughts to react differently.  Summary of Patient Progress:  The patient shared that they are resilient. Patient contributed to the discussion of how thoughts, feelings, and actions interact, noting when they may have experienced a negative thought pattern and recognized it as harmful. They were attentive when other patients shared their experiences, and worked to reframe their own thoughts in an activity to identify future situations where they may typically overthink.  Patient minimally participated but participated when directly asked a questions or for their insight into group topic.    Therapeutic Modalities:   Cognitive Behavioral Therapy Mindfulness  Mario Thomas, [place] 10/05/2022  11:36 AM

## 2022-10-05 NOTE — Discharge Summary (Signed)
Physician Discharge Summary Note  Patient:  Mario Thomas is an 21 y.o., male MRN:  [id number] DOB:  04/30/2001 Patient phone:  [phone] (home)  Patient address:   [address] [place] [address],  Total Time spent with patient: 30 minutes  Date of Admission:  09/24/2022 Date of Discharge: 10/05/2022  Reason for Admission: Mario Thomas is a 21 yo African American male who walked into this [facility] on 09/24/22 with complaints of suicidal and homicidal ideations. Pt was admitted voluntarily for treatment and stabilization of his mood.  Pt with prior hospitalizations at this [facility] from 11/09/21 through 11/17/2021 for manic type behaviors, and from 06/11/22 through 06/20/22 for bizarre behaviors and making threats to his parents.      Principal Problem: Schizoaffective disorder, bipolar type [facility]) Discharge Diagnoses: Principal Problem:   Schizoaffective disorder, bipolar type ([place]) Active Problems:   Anxiety state   Insomnia  Past Psychiatric History: As above  Past Medical History:  Past Medical History:  Diagnosis Date   Heart murmur    MDD (major depressive disorder), severe ([place]) 08/23/2018   History reviewed. No pertinent surgical history. Family History:  Family History  Problem Relation Age of Onset   Hyperlipidemia Father    Family Psychiatric  History: none reported Social History:  Social History   Substance and Sexual Activity  Alcohol Use Not Currently     Social History   Substance and Sexual Activity  Drug Use Never   Comment: Denies    Social History   Socioeconomic History   Marital status: Single    Spouse name: Not on file   Number of children: Not on file   Years of education: Not on file   Highest education level: Not on file  Occupational History   Not on file  Tobacco Use   Smoking status: Never   Smokeless tobacco: Never  Vaping Use   Vaping Use: Never used  Substance and Sexual Activity   Alcohol use: Not Currently   Drug use: Never     Comment: Denies   Sexual activity: Never  Other Topics Concern   Not on file  Social History Narrative   01/23/21   From: the area   Living: with parents   Work: Primary school teacher: Estate manager/land agent at [employer]: good relationship with parents, 1 sibling      Enjoys: running      Exercise: daily running    Diet: meat, veggies, grains      Safety   Seat belts: Yes    Guns: No   Safe in relationships: Yes    Social Determinants of Radio broadcast assistant Strain: Not on file  Food Insecurity: No Food Insecurity (09/24/2022)   Hunger Vital Sign    Worried About Running Out of Food in the Last Year: Never true    [place] in the Last Year: Never true  Transportation Needs: No Transportation Needs (09/24/2022)   PRAPARE - Hydrologist (Medical): No    Lack of Transportation (Non-Medical): No  Physical Activity: Not on file  Stress: Not on file  Social Connections: Not on file                                          HOSPITAL COURSE During the patient's hospitalization, patient had  extensive initial psychiatric evaluation, and follow-up psychiatric evaluations every day.  Psychiatric diagnoses provided upon initial assessment were: Principal Problem:   Schizoaffective disorder, bipolar type ([facility]) Active Problems:   Anxiety state   Insomnia   Patient's psychiatric medications were adjusted on admission as follows: -Start Invega 12 mg nightly for psychosis -Start Trazodone 50 mg nightly PRN for insomnia -Start Hydroxyzine 25 mg TID PRN for anxiety -Start Agitation protocol (Zyprexa, Ativan, Geodon PRN)-See MAR   During the hospitalization, other adjustments were made to the patient's psychiatric medication regimen, with medications at discharge being as follows: -Given Risperdal Consta inj IM 25 mg on 10/24 -Given Risperdal Consta IM 25 mg on 10/25 -Give next dose of Risperdal Consta 50 mg 10/17/2022 -Continue  Risperdal to 4 mg BID for psychosis x 3 weeks, then stop and continue with Long acting injectable Risperdal 50 mg every 2 weeks. -Continue Inderal 10 mg BID for anxiety and tachycardia -Continue Trazodone 50 mg nightly PRN for insomnia -Continue Hydroxyzine 25 mg TID PRN for anxiety -Previously discontinued Invega 12 mg nightly for psychosis on 10/19 d/t ineffectiveness. 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   Patient's care was discussed during the interdisciplinary team meeting every day during the hospitalization. The patient denies having side effects to prescribed psychiatric medication. Gradually, patient started adjusting to milieu. The patient was evaluated each day by a clinical provider to ascertain response to treatment. Improvement was noted by the patient's report of decreasing symptoms, improved sleep and appetite, affect, medication tolerance, behavior, and participation in unit programming.  Patient was asked each day to complete a self inventory noting mood, mental status, pain, new symptoms, anxiety and concerns.     Symptoms were reported as significantly decreased or resolved completely by discharge. On day of discharge, the patient reports that their mood is stable. The patient denied having suicidal thoughts for more than 48 hours prior to discharge.  Patient denies having homicidal thoughts.  Patient denies having auditory hallucinations.  Patient denies any visual hallucinations or other symptoms of psychosis. The patient was motivated to continue taking medication with a goal of continued improvement in mental health.    The patient reports their target psychiatric symptoms of psychosis, insomnia & anxiety responded well to the psychiatric medications, and the patient reports overall benefit from this psychiatric hospitalization. Supportive psychotherapy was provided to the patient. The patient also participated in regular group therapy while hospitalized. Coping skills,  problem solving as well as relaxation therapies were also part of the unit programming.   Labs were reviewed with the patient, and abnormal results were discussed with the patient.   Physical Findings: AIMS: Facial and Oral Movements Muscles of Facial Expression: None, normal Lips and Perioral Area: None, normal Jaw: None, normal Tongue: None, normal,Extremity Movements Upper (arms, wrists, hands, fingers): None, normal Lower (legs, knees, ankles, toes): None, normal, Trunk Movements Neck, shoulders, hips: None, normal, Overall Severity Severity of abnormal movements (highest score from questions above): None, normal Incapacitation due to abnormal movements: None, normal Patient's awareness of abnormal movements (rate only patient's report): No Awareness, Dental Status Current problems with teeth and/or dentures?: No Does patient usually wear dentures?: No  CIWA:  n/a COWS: n/a AIMS: 0    Musculoskeletal: Strength & Muscle Tone: within normal limits Gait & Station: normal Patient leans: N/A   Psychiatric Specialty Exam:  Presentation  General Appearance:  Appropriate for Environment; Fairly Groomed  Eye Contact: Good  Speech: Clear and Coherent  Speech Volume: Normal  Handedness: Right  Mood and Affect  Mood: Euthymic  Affect: Appropriate; Congruent   Thought Process  Thought Processes: Coherent  Descriptions of Associations:Intact  Orientation:Full (Time, Place and Person)  Thought Content:Logical  History of Schizophrenia/Schizoaffective disorder:Yes  Duration of Psychotic Symptoms:Greater than six months  Hallucinations:Hallucinations: None  Ideas of Reference:None  Suicidal Thoughts:Suicidal Thoughts: No  Homicidal Thoughts:Homicidal Thoughts: No   Sensorium  Memory: Immediate Good  Judgment: Fair  Insight: Fair   Art therapist  Concentration: Good  Attention Span: Good  Recall: Good  Fund of  Knowledge: Good  Language: Good   Psychomotor Activity  Psychomotor Activity: Psychomotor Activity: Normal   Assets  Assets: Communication Skills; Housing; Social Support   Sleep  Sleep: Sleep: Good Number of Hours of Sleep: 10.5    Physical Exam: Physical Exam Constitutional:      Appearance: Normal appearance.  HENT:     Head: Normocephalic.     Nose: Nose normal. No congestion or rhinorrhea.  Eyes:     Pupils: Pupils are equal, round, and reactive to light.  Pulmonary:     Effort: Pulmonary effort is normal. No respiratory distress.  Musculoskeletal:     Cervical back: Normal range of motion.  Neurological:     Mental Status: He is alert and oriented to person, place, and time.  Psychiatric:        Behavior: Behavior normal.        Thought Content: Thought content normal.    Review of Systems  Constitutional: Negative.   HENT: Negative.    Eyes: Negative.   Respiratory: Negative.    Cardiovascular: Negative.   Gastrointestinal: Negative.   Genitourinary: Negative.   Musculoskeletal: Negative.   Skin: Negative.   Neurological: Negative.   Psychiatric/Behavioral:  Negative for depression, hallucinations, memory loss, substance abuse and suicidal ideas. The patient is not nervous/anxious and does not have insomnia.    Blood pressure 128/73, pulse 80, temperature 98.5 F (36.9 C), temperature source Oral, resp. rate 16, height 5\' 10"  (1.778 m), weight 97.1 kg, SpO2 100 %. Body mass index is 30.71 kg/m.   Social History   Tobacco Use  Smoking Status Never  Smokeless Tobacco Never   Tobacco Cessation:  N/A, patient does not currently use tobacco products   Blood Alcohol level:  Lab Results  Component Value Date   ETH <10 09/09/2022   ETH <10 08/18/2022    Metabolic Disorder Labs:  Lab Results  Component Value Date   HGBA1C 4.8 09/25/2022   MPG 91.06 09/25/2022   MPG 93.93 05/28/2022   Lab Results  Component Value Date   PROLACTIN  10.0 08/24/2018   Lab Results  Component Value Date   CHOL 122 09/25/2022   TRIG 27 09/25/2022   HDL 42 09/25/2022   CHOLHDL 2.9 09/25/2022   VLDL 5 09/25/2022   LDLCALC 75 09/25/2022   LDLCALC 41 05/28/2022    See Psychiatric Specialty Exam and Suicide Risk Assessment completed by Attending Physician prior to discharge.  Discharge destination:  Home  Is patient on multiple antipsychotic therapies at discharge:  No   Has Patient had three or more failed trials of antipsychotic monotherapy by history:  No  Recommended Plan for Multiple Antipsychotic Therapies: NA   Allergies as of 10/05/2022   No Known Allergies      Medication List     STOP taking these medications    Invega Sustenna 234 MG/1.5ML injection Generic drug: paliperidone   paliperidone 9 MG 24 hr tablet Commonly known as: INVEGA  TAKE these medications      Indication  benztropine 1 MG tablet Commonly known as: COGENTIN Take 1 tablet (1 mg total) by mouth 2 (two) times daily.  Indication: EPS prophylaxis   hydrOXYzine 25 MG tablet Commonly known as: ATARAX Take 1 tablet (25 mg total) by mouth 3 (three) times daily as needed for anxiety.  Indication: Feeling Anxious   propranolol 10 MG tablet Commonly known as: INDERAL Take 1 tablet (10 mg total) by mouth 2 (two) times daily.  Indication: tachycardia and anxiety   risperiDONE 4 MG disintegrating tablet Commonly known as: RISPERDAL M-TABS Take 1 tablet (4 mg total) by mouth 2 (two) times daily.  Indication: psychosis   risperiDONE microspheres 25 MG injection Commonly known as: RISPERDAL CONSTA Inject 2 mLs (25 mg total) into the muscle every 14 (fourteen) days. Start taking on: October 17, 2022  Indication: psychosis   traZODone 50 MG tablet Commonly known as: DESYREL Take 1 tablet (50 mg total) by mouth at bedtime. What changed:  when to take this reasons to take this  Indication: Trouble Sleeping        Follow-up  Information     [facility] Follow up.   Why: You have been connected to ACTT services through this agency.  You have already participated in initial assessment.  Counselor will meet with you at your house on Monday, October 30th, for wrap around mental health care. Contact information: [address] [place] [address] [phone]                Follow-up recommendations:   The patient is able to verbalize their individual safety plan to this provider.   # It is recommended to the patient to continue psychiatric medications as prescribed, after discharge from the hospital.     # It is recommended to the patient to follow up with your outpatient psychiatric provider and PCP.   # It was discussed with the patient, the impact of alcohol, drugs, tobacco have been there overall psychiatric and medical wellbeing, and total abstinence from substance use was recommended the patient.ed.   # Prescriptions provided or sent directly to preferred pharmacy at discharge. Patient agreeable to plan. Given opportunity to ask questions. Appears to feel comfortable with discharge.    # In the event of worsening symptoms, the patient is instructed to call the crisis hotline (988), 911 and or go to the nearest ED for appropriate evaluation and treatment of symptoms. To follow-up with primary care provider for other medical issues, concerns and or health care needs   # Patient was discharged home to parent's home with a plan to follow up as noted above.   Signed: Starleen Blue, NP 10/05/2022, 5:32 PM

## 2022-10-09 ENCOUNTER — Encounter (HOSPITAL_COMMUNITY): Payer: Self-pay

## 2022-10-09 ENCOUNTER — Emergency Department (HOSPITAL_COMMUNITY)
Admission: EM | Admit: 2022-10-09 | Discharge: 2022-10-11 | Disposition: A | Discharge status: Home / Self Care | Payer: 59 | Attending: Emergency Medicine | Admitting: Emergency Medicine

## 2022-10-09 DIAGNOSIS — Z046 Encounter for general psychiatric examination, requested by authority: Secondary | ICD-10-CM

## 2022-10-09 DIAGNOSIS — R45851 Suicidal ideations: Secondary | ICD-10-CM | POA: Diagnosis not present

## 2022-10-09 DIAGNOSIS — F29 Unspecified psychosis not due to a substance or known physiological condition: Secondary | ICD-10-CM | POA: Diagnosis not present

## 2022-10-09 DIAGNOSIS — F250 Schizoaffective disorder, bipolar type: Secondary | ICD-10-CM

## 2022-10-09 DIAGNOSIS — F22 Delusional disorders: Secondary | ICD-10-CM | POA: Insufficient documentation

## 2022-10-09 DIAGNOSIS — R45850 Homicidal ideations: Secondary | ICD-10-CM | POA: Diagnosis not present

## 2022-10-09 DIAGNOSIS — Z1152 Encounter for screening for COVID-19: Secondary | ICD-10-CM | POA: Diagnosis not present

## 2022-10-09 LAB — CBC WITH DIFFERENTIAL/PLATELET
Abs Immature Granulocytes: 0.02 10*3/uL (ref 0.00–0.07)
Basophils Absolute: 0.1 10*3/uL (ref 0.0–0.1)
Basophils Relative: 1 %
Eosinophils Absolute: 0.4 10*3/uL (ref 0.0–0.5)
Eosinophils Relative: 5 %
HCT: 45.2 % (ref 39.0–52.0)
Hemoglobin: 15.4 g/dL (ref 13.0–17.0)
Immature Granulocytes: 0 %
Lymphocytes Relative: 31 %
Lymphs Abs: 2.3 10*3/uL (ref 0.7–4.0)
MCH: 30.4 pg (ref 26.0–34.0)
MCHC: 34.1 g/dL (ref 30.0–36.0)
MCV: 89.2 fL (ref 80.0–100.0)
Monocytes Absolute: 0.7 10*3/uL (ref 0.1–1.0)
Monocytes Relative: 9 %
Neutro Abs: 4 10*3/uL (ref 1.7–7.7)
Neutrophils Relative %: 54 %
Platelets: 230 10*3/uL (ref 150–400)
RBC: 5.07 MIL/uL (ref 4.22–5.81)
RDW: 12.3 % (ref 11.5–15.5)
WBC: 7.4 10*3/uL (ref 4.0–10.5)
nRBC: 0 % (ref 0.0–0.2)

## 2022-10-09 LAB — COMPREHENSIVE METABOLIC PANEL
ALT: 13 U/L (ref 0–44)
AST: 22 U/L (ref 15–41)
Albumin: 4.3 g/dL (ref 3.5–5.0)
Alkaline Phosphatase: 82 U/L (ref 38–126)
Anion gap: 9 (ref 5–15)
BUN: 18 mg/dL (ref 6–20)
CO2: 26 mmol/L (ref 22–32)
Calcium: 9.8 mg/dL (ref 8.9–10.3)
Chloride: 102 mmol/L (ref 98–111)
Creatinine, Ser: 0.81 mg/dL (ref 0.61–1.24)
GFR, Estimated: >60 mL/min (ref >60)
Glucose, Bld: 88 mg/dL (ref 70–99)
Potassium: 4.1 mmol/L (ref 3.5–5.1)
Sodium: 137 mmol/L (ref 135–145)
Total Bilirubin: 0.4 mg/dL (ref 0.3–1.2)
Total Protein: 7.2 g/dL (ref 6.5–8.1)

## 2022-10-09 LAB — URINALYSIS, ROUTINE W REFLEX MICROSCOPIC
Bilirubin Urine: NEGATIVE
Glucose, UA: NEGATIVE mg/dL
Hgb urine dipstick: NEGATIVE
Ketones, ur: NEGATIVE mg/dL
Leukocytes,Ua: NEGATIVE
Nitrite: NEGATIVE
Protein, ur: NEGATIVE mg/dL
Specific Gravity, Urine: 1.028 (ref 1.005–1.030)
pH: 5 (ref 5.0–8.0)

## 2022-10-09 LAB — ACETAMINOPHEN LEVEL: Acetaminophen (Tylenol), Serum: <10 ug/mL — ABNORMAL LOW (ref 10–30)

## 2022-10-09 LAB — RAPID URINE DRUG SCREEN, HOSP PERFORMED
Amphetamines: NOT DETECTED
Barbiturates: NOT DETECTED
Benzodiazepines: NOT DETECTED
Cocaine: NOT DETECTED
Opiates: NOT DETECTED
Tetrahydrocannabinol: NOT DETECTED

## 2022-10-09 LAB — ETHANOL: Alcohol, Ethyl (B): <10 mg/dL (ref <10)

## 2022-10-09 LAB — RESP PANEL BY RT-PCR (FLU A&B, COVID) ARPGX2
Influenza A by PCR: NEGATIVE
Influenza B by PCR: NEGATIVE
SARS Coronavirus 2 by RT PCR: NEGATIVE

## 2022-10-09 LAB — SALICYLATE LEVEL: Salicylate Lvl: <7 mg/dL — ABNORMAL LOW (ref 7.0–30.0)

## 2022-10-09 MED ORDER — TRAZODONE HCL 50 MG PO TABS
50.0000 mg | ORAL_TABLET | Freq: Every day | ORAL | Status: DC
  Administered 2022-10-09 – 2022-10-10 (×2): 50 mg via ORAL
  Filled 2022-10-09 (×2): qty 1

## 2022-10-09 MED ORDER — RISPERIDONE 1 MG PO TBDP
4.0000 mg | ORAL_TABLET | Freq: Two times a day (BID) | ORAL | Status: DC
  Administered 2022-10-09 – 2022-10-10 (×3): 4 mg via ORAL
  Filled 2022-10-09 (×5): qty 4

## 2022-10-09 MED ORDER — BENZTROPINE MESYLATE 1 MG PO TABS
1.0000 mg | ORAL_TABLET | Freq: Two times a day (BID) | ORAL | Status: DC
  Administered 2022-10-09 – 2022-10-10 (×3): 1 mg via ORAL
  Filled 2022-10-09 (×5): qty 1

## 2022-10-09 MED ORDER — PROPRANOLOL HCL 10 MG PO TABS
10.0000 mg | ORAL_TABLET | Freq: Two times a day (BID) | ORAL | Status: DC
  Administered 2022-10-09 – 2022-10-11 (×4): 10 mg via ORAL
  Filled 2022-10-09 (×4): qty 1

## 2022-10-09 MED ORDER — STERILE WATER FOR INJECTION IJ SOLN
INTRAMUSCULAR | Status: AC
  Filled 2022-10-09: qty 10

## 2022-10-09 MED ORDER — HYDROXYZINE HCL 25 MG PO TABS: 25.0000 mg | ORAL_TABLET | Freq: Three times a day (TID) | ORAL | Status: DC | PRN

## 2022-10-09 MED ORDER — ZIPRASIDONE MESYLATE 20 MG IM SOLR
20.0000 mg | Freq: Two times a day (BID) | INTRAMUSCULAR | Status: DC | PRN
  Administered 2022-10-09: 20 mg via INTRAMUSCULAR
  Filled 2022-10-09: qty 20
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
## 2022-10-09 NOTE — BH Assessment (Signed)
Per Rogelio Seen, NP, patient meets criteria for inpatient psychiatric treatment. Patient referred to [facility]. However, the [facility] [name], RN) confirmed no bed availability.   Patient referred to the following facilities for consideration of bed placement.    Destination Service Provider Request Status Selected Services Address Phone Fax Patient Preferred  [place] [address], [place] [address] [phone] --  [place] [address], [place] [address] [phone] [address] [place] [address] [phone] --  [place], [place] [address] [phone] --  [address] [place] [address] [phone] --  [facility]  Pending - Request Sent N/A [address] [place] [address] [phone] --   [facility]  Pending - Request Sent N/A [address] [place] [address] [phone] --  [facility]  Pending - Request Sent N/A [place] [place] [address] [phone] --  [facility]  Pending - Request Sent N/A [address] [place] [address] [phone] --  [place] N/A [address], [place] [address] [phone] --  [facility]  Pending - Request Sent N/A [address] [place] [address] [phone] --  [facility]  Pending - Request Sent N/A [place], [place] [address] [phone] --  [facility]  Pending - Request Sent N/A [address] [place] [address] [phone] --  [facility]  Pending - Request Sent N/A [place] [place] [address] [phone] --  [facility]  Pending - Request Sent N/A [address] [place] [address] [phone] --  [place] N/A [address] [place] [address] [phone] --  [facility]  Pending - Request Sent N/A [address], [place] [address] [phone] --  [facility]  Pending - Request Sent N/A [address] [place] [address] [phone] --  [place] N/A [place], [place] [address] [phone] --  [facility]  Pending - Request Sent N/A [address] [place] [address] [phone] --  [facility]  Pending - Request Sent N/A [address] [place], [place] [address] [phone] --  [facility]  Pending - Request Sent N/A [place], [place] [address] [phone]

## 2022-10-09 NOTE — ED Notes (Signed)
Pt consistently walking around the lobby, making erratic statements. Pt claims "I have ebola" and then proceeds to march off dancing. Pt redirected and asked to sit down and wait to be called for triage multiple times and is not cooperating.
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
## 2022-10-09 NOTE — ED Notes (Signed)
IVC paperwork is done. The paperwork is in [place] zone purple clipboard. IVCed: 10/09/22. Exp: 10/16/22

## 2022-10-09 NOTE — ED Provider Notes (Addendum)
[facility] EMERGENCY DEPARTMENT Provider Note   CSN: [id number] Arrival date & time: 10/09/22  1431     History  Chief Complaint  Patient presents with   Psychiatric Evaluation    Mario Thomas is a 21 y.o. male.  HPI   21 year old male with medical history significant for schizoaffective disorder who presents to the emergency department with concern for decompensation.  The patient states that the [employer] came in to his house and he is "after him."  He was found wandering in triage in the emergency department making bizarre statements and dancing and jumping around.  He appears to be responding to internal stimuli.  He endorses auditory and visual hallucinations.  He states "I hear black people talking to me, tell me they are going to write me."  He endorses HI towards these people.  He endorses passive SI, when asked looks in the corner and then begins to deny it.  He is unsure of what medications he is currently on.  He was recently admitted to behavioral health from 10/16 to 10/27 and was just discharged a few days ago.  Home Medications Prior to Admission medications   Medication Sig Start Date End Date Taking? Authorizing Provider  benztropine (COGENTIN) 1 MG tablet Take 1 tablet (1 mg total) by mouth 2 (two) times daily. 10/05/22   Starleen Blue, NP  hydrOXYzine (ATARAX) 25 MG tablet Take 1 tablet (25 mg total) by mouth 3 (three) times daily as needed for anxiety. 10/05/22   Starleen Blue, NP  propranolol (INDERAL) 10 MG tablet Take 1 tablet (10 mg total) by mouth 2 (two) times daily. 10/05/22   Starleen Blue, NP  risperiDONE (RISPERDAL M-TABS) 4 MG disintegrating tablet Take 1 tablet (4 mg total) by mouth 2 (two) times daily. 10/05/22   Starleen Blue, NP  risperiDONE microspheres (RISPERDAL CONSTA) 25 MG injection Inject 2 mLs (25 mg total) into the muscle every 14 (fourteen) days. 10/17/22   Starleen Blue, NP  traZODone (DESYREL) 50 MG tablet Take 1 tablet (50 mg  total) by mouth at bedtime. 10/05/22   Starleen Blue, NP      Allergies    Patient has no known allergies.    Review of Systems   Review of Systems  All other systems reviewed and are negative.   Physical Exam Updated Vital Signs BP (!) 134/57 (BP Location: Right Arm)   Pulse (!) 101   Temp 98.1 F (36.7 C) (Oral)   Resp 16   Ht 5\' 10"  (1.778 m)   Wt 95.3 kg   SpO2 99%   BMI 30.13 kg/m  Physical Exam Vitals and nursing note reviewed.  Constitutional:      General: He is not in acute distress. HENT:     Head: Normocephalic and atraumatic.  Eyes:     Conjunctiva/sclera: Conjunctivae normal.     Pupils: Pupils are equal, round, and reactive to light.  Cardiovascular:     Rate and Rhythm: Normal rate and regular rhythm.  Pulmonary:     Effort: Pulmonary effort is normal. No respiratory distress.     Breath sounds: Normal breath sounds.  Abdominal:     General: There is no distension.     Tenderness: There is no guarding.  Musculoskeletal:        General: No deformity or signs of injury.     Cervical back: Neck supple.  Skin:    Findings: No lesion or rash.  Neurological:     General: No  focal deficit present.     Mental Status: He is alert. Mental status is at baseline.  Psychiatric:        Attention and Perception: He perceives auditory and visual hallucinations.        Mood and Affect: Affect is flat.        Speech: Speech normal.        Behavior: Behavior is slowed. Behavior is cooperative.        Thought Content: Thought content is paranoid and delusional. Thought content includes homicidal and suicidal ideation. Thought content does not include homicidal or suicidal plan.     ED Results / Procedures / Treatments   Labs (all labs ordered are listed, but only abnormal results are displayed) Labs Reviewed  URINALYSIS, ROUTINE W REFLEX MICROSCOPIC - Abnormal; Notable for the following components:      Result Value   APPearance HAZY (*)    All other  components within normal limits  RESP PANEL BY RT-PCR (FLU A&B, COVID) ARPGX2  CBC WITH DIFFERENTIAL/PLATELET  COMPREHENSIVE METABOLIC PANEL  ETHANOL  RAPID URINE DRUG SCREEN, HOSP PERFORMED  SALICYLATE LEVEL  ACETAMINOPHEN LEVEL    EKG None  Radiology No results found.  Procedures Procedures    Medications Ordered in ED Medications  benztropine (COGENTIN) tablet 1 mg (has no administration in time range)  hydrOXYzine (ATARAX) tablet 25 mg (has no administration in time range)  propranolol (INDERAL) tablet 10 mg (has no administration in time range)  risperiDONE (RISPERDAL M-TABS) disintegrating tablet 4 mg (has no administration in time range)  traZODone (DESYREL) tablet 50 mg (has no administration in time range)    ED Course/ Medical Decision Making/ A&P                           Medical Decision Making Risk Prescription drug management.    21 year old male with medical history significant for schizoaffective disorder who presents to the emergency department with concern for decompensation.  The patient states that the [employer] came in to his house and he is "after him."  He was found wandering in triage in the emergency department making bizarre statements and dancing and jumping around.  He appears to be responding to internal stimuli.  He endorses auditory and visual hallucinations.  He states "I hear black people talking to me, tell me they are going to write me."  He endorses HI towards these people.  He endorses passive SI, when asked looks in the corner and then begins to deny it.  He is unsure of what medications he is currently on.  He was recently admitted to behavioral health from 10/16 to 10/27 and was just discharged a few days ago.  On arrival, the patient was vitally stable.  Presenting with concern for decompensated schizoaffective disorder.  Unclear if the patient is taking his medications.  Just recently discharged from behavioral health.  Responding to internal  stimuli.  No medical complaints at this time and vitally stable.  His home medications reordered.  Screening laboratory evaluation collected and unremarkable.  IVC paperwork was taken out on the patient due to concern for paranoid delusions, auditory and visual hallucinations with decompensated schizoaffective disorder.  Patient also endorsing passive HI and SI in the setting of his decompensation.  Medically clear at this time for TTS consultation.  The patient became increasingly agitated in the ED, harassing other patients in hall beds, increasing psychomotor activity. Geodon IM administered for stabilization.   Final  Clinical Impression(s) / ED Diagnoses Final diagnoses:  Psychosis, unspecified psychosis [facility])    Rx / [place] Orders ED Discharge Orders     None         Regan Lemming, MD 10/09/22 1714    Regan Lemming, MD 10/09/22 1947

## 2022-10-09 NOTE — ED Triage Notes (Signed)
Pt arrives today acting strangely. Pt points to his groin and says he has pain in his vortex. Pt states that the guys on [employer] are hot.  Pt does not tell a story that makes any sense. Unsure if he is SI.

## 2022-10-09 NOTE — BH Assessment (Signed)
10/31: Vesta Mixer, NP recommends inpatient psychiatric treatment. Patient referred to [facility] for consideration of inpatient psychiatric treatment.

## 2022-10-10 DIAGNOSIS — F29 Unspecified psychosis not due to a substance or known physiological condition: Secondary | ICD-10-CM

## 2022-10-10 DIAGNOSIS — Z046 Encounter for general psychiatric examination, requested by authority: Secondary | ICD-10-CM

## 2022-10-10 DIAGNOSIS — F250 Schizoaffective disorder, bipolar type: Secondary | ICD-10-CM

## 2022-10-10 MED ORDER — LORAZEPAM 2 MG/ML IJ SOLN: 1.0000 mg | Freq: Four times a day (QID) | INTRAMUSCULAR | Status: DC | PRN

## 2022-10-10 MED ORDER — RISPERIDONE MICROSPHERES ER 25 MG IM SRER: 25.0000 mg | INTRAMUSCULAR | Status: DC

## 2022-10-10 MED ORDER — HYDROXYZINE HCL 25 MG PO TABS
25.0000 mg | ORAL_TABLET | Freq: Three times a day (TID) | ORAL | Status: DC
  Administered 2022-10-10 – 2022-10-11 (×4): 25 mg via ORAL
  Filled 2022-10-10 (×4): qty 1

## 2022-10-10 MED ORDER — LORAZEPAM 0.5 MG PO TABS: 0.5000 mg | ORAL_TABLET | Freq: Four times a day (QID) | ORAL | Status: DC | PRN

## 2022-10-10 NOTE — Progress Notes (Signed)
Patient has been faxed out due to no appropriate beds at [facility] available. Patient meets [place] inpatient criteria per Chana Bode, NP. Patient has been faxed out to the following facilities:    [place] [place] [address] [phone]  [facility]  [address] [place] [address] [phone] [facility]  [address] [place] [address] [phone] [place], [place] [address] [place]  [facility]  [address] [place] [address] [place]  [facility]  [address] [place] [address] [phone]  [facility]  [address] [place] [address] [phone] [place] [address] [place] [address] [phone]  [facility]  [address] [place] [address] [phone] [place]  [address] [place] [address] [phone] [place]  [address] [place] [address] [phone] [facility]  [address], [place] [address] [phone] [facility]  [address] [place] [address] [phone]  [facility]  [address] [place] [address] [phone] [facility]  [address], [place] [address] [phone] [facility]  [address] [place] [address] [phone] [facility]  [address] [place] [address] [phone]  [facility]  [address] [place] [address] [place]   [place]  [address], [place] [address] [phone]  [facility]  [address], [place] [address] [phone]  [facility]  [address] [place], [place] [address] [phone] [facility]  [place], [place] [address] [phone] [facility]  [place] [place] [address] [phone]   Mariea Clonts, MSW, LCSW-A  9:14 PM 10/10/2022

## 2022-10-10 NOTE — Progress Notes (Signed)
[facility] LCSW Progress Note   10/10/2022    9:59 PM  Mario Thomas   [id number]   Type of Contact and Topic:  Psychiatric Bed Placement   Pt accepted [facility]    Patient meets inpatient criteria per Chana Bode, NP  The attending provider will be Enzo Bi, MD   Call report to [phone]  Baldemar Friday, RN @ [facility]   Pt scheduled  to arrive at [facility] tomorrow after 0800.    Mariea Clonts, MSW, LCSW-A  10:00 PM 10/10/2022

## 2022-10-10 NOTE — ED Notes (Signed)
Pt belongings inventoried and placed in purple zone locker #3.

## 2022-10-10 NOTE — Progress Notes (Signed)
Pt has been denied at [facility].  This CSW started process for [facility] referral. Referral is under review. CSW and Disposition team will follow with placement.  Benjaman Kindler, MSW, LCSWA 10/10/2022 4:02 PM

## 2022-10-10 NOTE — ED Notes (Signed)
Patient walked back by sitter.

## 2022-10-10 NOTE — ED Provider Notes (Signed)
Emergency Medicine Observation Re-evaluation Note  Mario Thomas is a 21 y.o. male, seen on rounds today.  Pt initially presented to the ED for complaints of Psychiatric Evaluation Currently, the patient is standing awake, asking about breakfast.  Physical Exam  BP 131/78 (BP Location: Left Arm)   Pulse 65   Temp 97.7 F (36.5 C) (Oral)   Resp 17   Ht 5\' 10"  (1.778 m)   Wt 95.3 kg   SpO2 100%   BMI 30.13 kg/m  Physical Exam General: Awake and alert no acute distress Cardiac: Regular rate Lungs: No increased work of breathing Psych: Slightly anxious appearing but calm and cooperative  ED Course / MDM  EKG:   I have reviewed the labs performed to date as well as medications administered while in observation.  Recent changes in the last 24 hours include Patient was evaluated by psychiatry and recommended inpatient placement.  Home meds will be continued.  Plan  Current plan is for inpatient placement.    Kemper Durie, DO 10/10/22 [phone]

## 2022-10-10 NOTE — Progress Notes (Signed)
[facility] Psych ED Progress Note  10/10/2022 3:44 PM Mario Thomas  MRN:  [id number]   Subjective:  " I just do not feel like myself". Principal Problem: Schizoaffective disorder, bipolar type ([place]) Diagnosis:  Principal Problem:   Schizoaffective disorder, bipolar type ([place]) Active Problems:   Involuntary commitment   ED Assessment Time Calculation: Start Time: 1300 Stop Time: 1330 Total Time in Minutes (Assessment Completion): 40  Mario Thomas, 21 year old male, under IVC petition, with a history of schizoaffective disorder bipolar type, evaluated face to face, per TTS consult order for psychiatric evaluation. Patient admitted to [facility] on 10/09/2022,, accompanied by law enforcement, with worsening disorganized and delusion thoughts.  Patient was only discharged from [facility] on 1027 after an 11-day admission for similar progression of symptoms.  On evaluation, prior to this writer physically walking over to see patient, patient is a bit agitated and making statements such as "I'm  black" and "being black matters".  He was not directing his statements to anyone in particular however appeared anxious.  This writer increase his hydroxyzine to every 8 hours as patient in his current state of mind is unable to adequately requests as needed medication.  This Probation officer allowed time for patient to have medications and lower his level of anxiety, prior to approaching him.  Patient eventually lowered his level of anxiety and was approachable to conduct evaluation.  When approaching patient he stated, " I just do not feel like myself".  Patient still appears rather anxious though this writer asked the feel kind of anxious, patient replies "yes".  This Probation officer asked if he felt like himself while he was in the hospital, patient states "yeah I was in the hospital last week wasn't I".  Patient stated, "I did not feel good while I was in the hospital and I did not feel good when I came home."  Patient is unable to confirm whether or  not he had been taking his medications at home.  He subsequently yelled out, "hey did you know it was sanctification day".  He followed up with that he sees spirits morphing and wizardry all around him."  This Probation officer asked if this makes him afraid he stated, "sometimes".  Patient further stated that he practices witchcraft and asked this writer if she practices witchcraft.  This Probation officer responded no, and patient began to describe practicing witchcraft on the screen, when asked does he have a video game called witchcraft and he responded by nodding his head yes.    During evaluation Mario Thomas is sitting on the bed in no acute distress. He is alert, oriented x 3 (patient was able to tell me the year, current location being in the hospital, the president, and his full name.  Patient unable to articulate why he is in the hospital and why he does not feel like himself.  Patient is calm and cooperative during interview however is inattentive at times.  He has a liable mood ranging from dysphoric to elated. Clear and coherent but pressured. Objectively, patient appears paranoid, at present does not appear to be responding to internal stimuli.  Patient denied suicidal or homicidal ideations.  When asked if he was experiencing any visual or auditory hallucinations his response was "may be sometimes".  When asked if he was actively hearing things besides me talking to him or seeing things other than people around him, his response was "no",  Past Psychiatric History:  [facility] Admission 09/24/2022-10/05/2022 LAI  with Risperdal Concerta [id number] 25 mg IM  initiated during [facility] admission Next scheduled dose Risperdal Consta 10/17/2022 25 mg IM  (see [place] discharge encounter or review PTA meds) Order placed for next scheduled dose of Risperdal Consta for this current admission   [place] Scale:  [facility] ED from 10/09/2022 in [place] Admission (Discharged) from OP Visit from  09/24/2022 in [facility] ED from 09/09/2022 in [place] DEPT  C-SSRS RISK CATEGORY High Risk High Risk High Risk       Past Medical History:  Past Medical History:  Diagnosis Date   Heart murmur    MDD (major depressive disorder), severe ([place]) 08/23/2018   History reviewed. No pertinent surgical history. Family History:  Family History  Problem Relation Age of Onset   Hyperlipidemia Father    Social History:  Social History   Substance and Sexual Activity  Alcohol Use Not Currently     Social History   Substance and Sexual Activity  Drug Use Never   Comment: Denies    Social History   Socioeconomic History   Marital status: Single    Spouse name: Not on file   Number of children: Not on file   Years of education: Not on file   Highest education level: Not on file  Occupational History   Not on file  Tobacco Use   Smoking status: Never   Smokeless tobacco: Never  Vaping Use   Vaping Use: Never used  Substance and Sexual Activity   Alcohol use: Not Currently   Drug use: Never    Comment: Denies   Sexual activity: Never  Other Topics Concern   Not on file  Social History Narrative   01/23/21   From: the area   Living: with parents   Work: Primary school teacher: Estate manager/land agent at [employer]: good relationship with parents, 1 sibling      Enjoys: running      Exercise: daily running    Diet: meat, veggies, grains      Safety   Seat belts: Yes    Guns: No   Safe in relationships: Yes    Social Determinants of Radio broadcast assistant Strain: Not on file  Food Insecurity: No Food Insecurity (09/24/2022)   Hunger Vital Sign    Worried About Running Out of Food in the Last Year: Never true    [place] in the Last Year: Never true  Transportation Needs: No Transportation Needs (09/24/2022)   PRAPARE - Hydrologist (Medical): No    Lack  of Transportation (Non-Medical): No  Physical Activity: Not on file  Stress: Not on file  Social Connections: Not on file    Sleep: Fair  Appetite:  Good  Current Medications: Current Facility-Administered Medications  Medication Dose Route Frequency Provider Last Rate Last Admin   benztropine (COGENTIN) tablet 1 mg  1 mg Oral BID Regan Lemming, MD   1 mg at 10/10/22 0901   hydrOXYzine (ATARAX) tablet 25 mg  25 mg Oral TID Scot Jun, FNP   25 mg at 10/10/22 1102   LORazepam (ATIVAN) tablet 0.5 mg  0.5 mg Oral Q6H PRN Scot Jun, FNP       Or   LORazepam (ATIVAN) injection 1 mg  1 mg Intramuscular Q6H PRN Scot Jun, FNP       propranolol (INDERAL) tablet 10 mg  10 mg  Oral BID Regan Lemming, MD   10 mg at 10/10/22 0901   risperiDONE (RISPERDAL M-TABS) disintegrating tablet 4 mg  4 mg Oral BID Regan Lemming, MD   4 mg at 10/10/22 0901   traZODone (DESYREL) tablet 50 mg  50 mg Oral QHS Regan Lemming, MD   50 mg at 10/09/22 2159   ziprasidone (GEODON) injection 20 mg  20 mg Intramuscular Q12H PRN Regan Lemming, MD   20 mg at 10/09/22 1801   Current Outpatient Medications  Medication Sig Dispense Refill   benztropine (COGENTIN) 1 MG tablet Take 1 tablet (1 mg total) by mouth 2 (two) times daily. 60 tablet 0   hydrOXYzine (ATARAX) 25 MG tablet Take 1 tablet (25 mg total) by mouth 3 (three) times daily as needed for anxiety. 30 tablet 0   propranolol (INDERAL) 10 MG tablet Take 1 tablet (10 mg total) by mouth 2 (two) times daily. 60 tablet 0   traZODone (DESYREL) 50 MG tablet Take 1 tablet (50 mg total) by mouth at bedtime. 30 tablet 0   risperiDONE (RISPERDAL M-TABS) 4 MG disintegrating tablet Take 1 tablet (4 mg total) by mouth 2 (two) times daily. 45 tablet 0   [START ON 10/17/2022] risperiDONE microspheres (RISPERDAL CONSTA) 25 MG injection Inject 2 mLs (25 mg total) into the muscle every 14 (fourteen) days. (Patient not taking: Reported on 10/09/2022) 1 each 0     Lab Results:  Results for orders placed or performed during the hospital encounter of 10/09/22 (from the past 48 hour(s))  Comprehensive metabolic panel     Status: None   Collection Time: 10/09/22  4:12 PM  Result Value Ref Range   Sodium 137 135 - 145 mmol/L   Potassium 4.1 3.5 - 5.1 mmol/L   Chloride 102 98 - 111 mmol/L   CO2 26 22 - 32 mmol/L   Glucose, Bld 88 70 - 99 mg/dL    Comment: Glucose reference range applies only to samples taken after fasting for at least 8 hours.   BUN 18 6 - 20 mg/dL   Creatinine, Ser 0.81 0.61 - 1.24 mg/dL   Calcium 9.8 8.9 - 10.3 mg/dL   Total Protein 7.2 6.5 - 8.1 g/dL   Albumin 4.3 3.5 - 5.0 g/dL   AST 22 15 - 41 U/L   ALT 13 0 - 44 U/L   Alkaline Phosphatase 82 38 - 126 U/L   Total Bilirubin 0.4 0.3 - 1.2 mg/dL   GFR, Estimated >60 >60 mL/min    Comment: (NOTE) Calculated using the CKD-EPI Creatinine Equation (2021)    Anion gap 9 5 - 15    Comment: Performed at [facility] [address] [place], [place] [address]  Ethanol     Status: None   Collection Time: 10/09/22  4:12 PM  Result Value Ref Range   Alcohol, Ethyl (B) <10 <10 mg/dL    Comment: (NOTE) Lowest detectable limit for serum alcohol is 10 mg/dL.  For medical purposes only. Performed at [facility], [place] [address] [place], [place] [address]   Urine rapid drug screen (hosp performed)     Status: None   Collection Time: 10/09/22  4:12 PM  Result Value Ref Range   Opiates NONE DETECTED NONE DETECTED   Cocaine NONE DETECTED NONE DETECTED   Benzodiazepines NONE DETECTED NONE DETECTED   Amphetamines NONE DETECTED NONE DETECTED   Tetrahydrocannabinol NONE DETECTED NONE DETECTED   Barbiturates NONE DETECTED NONE DETECTED  Comment: (NOTE) DRUG SCREEN FOR MEDICAL PURPOSES ONLY.  IF CONFIRMATION IS NEEDED FOR ANY PURPOSE, NOTIFY LAB WITHIN 5 DAYS.  LOWEST DETECTABLE LIMITS FOR URINE DRUG SCREEN Drug Class                     Cutoff  (ng/mL) Amphetamine and metabolites    1000 Barbiturate and metabolites    200 Benzodiazepine                 200 Opiates and metabolites        300 Cocaine and metabolites        300 THC                            50 Performed at [facility], [place] [address], [place], [place] [address]   CBC with Diff     Status: None   Collection Time: 10/09/22  4:12 PM  Result Value Ref Range   WBC 7.4 4.0 - 10.5 K/uL   RBC 5.07 4.22 - 5.81 MIL/uL   Hemoglobin 15.4 13.0 - 17.0 g/dL   HCT 45.2 39.0 - 52.0 %   MCV 89.2 80.0 - 100.0 fL   MCH 30.4 26.0 - 34.0 pg   MCHC 34.1 30.0 - 36.0 g/dL   RDW 12.3 11.5 - 15.5 %   Platelets 230 150 - 400 K/uL   nRBC 0.0 0.0 - 0.2 %   Neutrophils Relative % 54 %   Neutro Abs 4.0 1.7 - 7.7 K/uL   Lymphocytes Relative 31 %   Lymphs Abs 2.3 0.7 - 4.0 K/uL   Monocytes Relative 9 %   Monocytes Absolute 0.7 0.1 - 1.0 K/uL   Eosinophils Relative 5 %   Eosinophils Absolute 0.4 0.0 - 0.5 K/uL   Basophils Relative 1 %   Basophils Absolute 0.1 0.0 - 0.1 K/uL   Immature Granulocytes 0 %   Abs Immature Granulocytes 0.02 0.00 - 0.07 K/uL    Comment: Performed at [facility], [address], [place], [place] [id number]  Salicylate level     Status: Abnormal   Collection Time: 10/09/22  4:12 PM  Result Value Ref Range   Salicylate Lvl Q000111Q (L) 7.0 - 30.0 mg/dL    Comment: Performed at [facility] [address] [place], [place] [address]  Acetaminophen level     Status: Abnormal   Collection Time: 10/09/22  4:12 PM  Result Value Ref Range   Acetaminophen (Tylenol), Serum <10 (L) 10 - 30 ug/mL    Comment: (NOTE) Therapeutic concentrations vary significantly. A range of 10-30 ug/mL  may be an effective concentration for many patients. However, some  are best treated at concentrations outside of this range. Acetaminophen concentrations >150 ug/mL at 4 hours after ingestion  and >50 ug/mL at 12 hours after ingestion are often associated with   toxic reactions.  Performed at [facility], [place] [address], [place], [place] [address]   Urinalysis, Routine w reflex microscopic Urine, Clean Catch     Status: Abnormal   Collection Time: 10/09/22  4:12 PM  Result Value Ref Range   Color, Urine YELLOW YELLOW   APPearance HAZY (A) CLEAR   Specific Gravity, Urine 1.028 1.005 - 1.030   pH 5.0 5.0 - 8.0   Glucose, UA NEGATIVE NEGATIVE mg/dL   Hgb urine dipstick NEGATIVE NEGATIVE   Bilirubin Urine NEGATIVE NEGATIVE   Ketones,  ur NEGATIVE NEGATIVE mg/dL   Protein, ur NEGATIVE NEGATIVE mg/dL   Nitrite NEGATIVE NEGATIVE   Leukocytes,Ua NEGATIVE NEGATIVE    Comment: Performed at [facility] [address], [place], [place] [address]  Resp Panel by RT-PCR (Flu A&B, Covid) Anterior Nasal Swab     Status: None   Collection Time: 10/09/22  5:02 PM   Specimen: Anterior Nasal Swab  Result Value Ref Range   SARS Coronavirus 2 by RT PCR NEGATIVE NEGATIVE    Comment: (NOTE) SARS-CoV-2 target nucleic acids are NOT DETECTED.  The SARS-CoV-2 RNA is generally detectable in upper respiratory specimens during the acute phase of infection. The lowest concentration of SARS-CoV-2 viral copies this assay can detect is 138 copies/mL. A negative result does not preclude SARS-Cov-2 infection and should not be used as the sole basis for treatment or other patient management decisions. A negative result may occur with  improper specimen collection/handling, submission of specimen other than nasopharyngeal swab, presence of viral mutation(s) within the areas targeted by this assay, and inadequate number of viral copies(<138 copies/mL). A negative result must be combined with clinical observations, patient history, and epidemiological information. The expected result is Negative.  Fact Sheet for Patients:  EntrepreneurPulse.com.au  Fact Sheet for Healthcare Providers:  IncredibleEmployment.be  This  test is no t yet approved or cleared by the [place] FDA and  has been authorized for detection and/or diagnosis of SARS-CoV-2 by FDA under an Emergency Use Authorization (EUA). This EUA will remain  in effect (meaning this test can be used) for the duration of the COVID-19 declaration under Section 564(b)(1) of the Act, 21 U.S.C.section 360bbb-3(b)(1), unless the authorization is terminated  or revoked sooner.       Influenza A by PCR NEGATIVE NEGATIVE   Influenza B by PCR NEGATIVE NEGATIVE    Comment: (NOTE) The Xpert Xpress SARS-CoV-2/FLU/RSV plus assay is intended as an aid in the diagnosis of influenza from Nasopharyngeal swab specimens and should not be used as a sole basis for treatment. Nasal washings and aspirates are unacceptable for Xpert Xpress SARS-CoV-2/FLU/RSV testing.  Fact Sheet for Patients: EntrepreneurPulse.com.au  Fact Sheet for Healthcare Providers: IncredibleEmployment.be  This test is not yet approved or cleared by the [place] FDA and has been authorized for detection and/or diagnosis of SARS-CoV-2 by FDA under an Emergency Use Authorization (EUA). This EUA will remain in effect (meaning this test can be used) for the duration of the COVID-19 declaration under Section 564(b)(1) of the Act, 21 U.S.C. section 360bbb-3(b)(1), unless the authorization is terminated or revoked.  Performed at [facility], [place] [address] [place],  [address]     Blood Alcohol level:  Lab Results  Component Value Date   [facility] <10 10/09/2022   ETH <10 09/09/2022    Physical Findings:    Musculoskeletal: Strength & Muscle Tone: within normal limits Gait & Station: normal Patient leans: N/A  Psychiatric Specialty Exam:  Presentation  General Appearance:  Appropriate for Environment  Eye Contact: Fair  Speech: Pressured; Clear and Coherent  Speech Volume: Normal  Handedness: Right   Mood and  Affect  Mood: Labile  Affect: Labile   Thought Process  Thought Processes: Disorganized  Descriptions of Associations:Tangential  Orientation:Full (Time, Place and Person)  Thought Content:Delusions; Paranoid Ideation; Illogical  History of Schizophrenia/Schizoaffective disorder:Yes  Duration of Psychotic Symptoms:Greater than six months  Hallucinations:Hallucinations: Auditory Description of Auditory Hallucinations: The spirits are morphing and coming to take me. Its the day  of santification. Description of Visual Hallucinations: Spirits morphing and changing shapes  Ideas of Reference:Delusions; Paranoia  Suicidal Thoughts:Suicidal Thoughts: No  Homicidal Thoughts:Homicidal Thoughts: No   Sensorium  Memory: Recent Fair; Immediate Fair; Remote Poor  Judgment: Impaired  Insight: Lacking   Executive Functions  Concentration: Fair  Attention Span: Poor  Recall: [place] of Knowledge: Poor  Language: Fair   Psychomotor Activity  Psychomotor Activity: Psychomotor Activity: Normal   Assets  Assets: Communication Skills; Desire for Improvement; Resilience; Physical Health; Social Support   Sleep  Sleep: Sleep: Fair Number of Hours of Sleep: 0 (due to mental condition unable to report)    Physical Exam: Physical Exam Constitutional:      Appearance: Normal appearance.  HENT:     Head: Normocephalic and atraumatic.     Nose: Nose normal.  Eyes:     Extraocular Movements: Extraocular movements intact.     Conjunctiva/sclera: Conjunctivae normal.     Pupils: Pupils are equal, round, and reactive to light.  Cardiovascular:     Rate and Rhythm: Normal rate.  Pulmonary:     Effort: Pulmonary effort is normal.     Breath sounds: Normal breath sounds.  Musculoskeletal:        General: Normal range of motion.     Cervical back: Normal range of motion.  Skin:    General: Skin is warm.  Neurological:     General: No focal deficit  present.     Mental Status: He is alert.    Review of Systems  Psychiatric/Behavioral:  Positive for hallucinations. The patient is nervous/anxious.    Blood pressure 116/76, pulse 74, temperature 98.5 F (36.9 C), temperature source Oral, resp. rate 18, height 5\' 10"  (1.778 m), weight 95.3 kg, SpO2 100 %. Body mass index is 30.13 kg/m.   Medical Decision Making: Patient case review and discussed with Dr. Dwyane Dee . Patient continues to meet inpatient criteria for safety, stabilization, and medication management.  Patient is followed by Dr. Zollie Scale outpatient ACT team who had updated our social work team and requested that patient be referred to [facility] due to worsening decompensation following his recent inpatient admission.  CSW notified and is currently working on referral to [facility] for patient.  Patient previously faxed out overnight to multiple facilities which are currently reviewing patient for admission. EDP, RN, LCSW, notified of disposition.    Schizoaffective disorder, Bipolar type, continue Risperdal, for anxiety symptoms associated with disorder scheduled hydroxyzine every 8 hours to minimize agitation anxiety, continue Cogentin.  Patient's next dose of  Risperdal Consta 25 mg (2 ml)  scheduled for 10/17/2022 and updated in patients med orders to reflect next dose.   Molli Barrows, FNP 10/10/2022, 3:44 PM

## 2022-10-10 NOTE — BHH Counselor (Signed)
CSW received contact from team lead, Abby, from psychotherapeutic services.  Patient was referred and connected to ACTT from previous admission last week.  Abby states that their doctor went to visit and was very concerned with patient behavior.  Doctor, Dr. Holley Raring, recommending that patient be referred to [facility] from the ED. Contact for ACTT doctor below:  Dr. Holley Raring [phone].   Floye Fesler, LCSW, [place] Social Worker  [facility]

## 2022-10-11 NOTE — ED Notes (Signed)
Sheriff arrived for transport

## 2022-10-11 NOTE — ED Provider Notes (Signed)
Emergency Medicine Observation Re-evaluation Note  Mario Thomas is a 21 y.o. male, seen on rounds today.  Pt initially presented to the ED for complaints of Psychiatric Evaluation Currently, the patient is awaiting transfer.  Physical Exam  BP 137/87 (BP Location: Right Arm)   Pulse 65   Temp 98.7 F (37.1 C) (Oral)   Resp 15   Ht 5\' 10"  (1.778 m)   Wt 95.3 kg   SpO2 100%   BMI 30.13 kg/m  Physical Exam General: Calm Cardiac: Well perfused.  Lungs: Even respirations.  Psych: Calm.   ED Course / MDM  EKG:   I have reviewed the labs performed to date as well as medications administered while in observation.  Recent changes in the last 24 hours include patient accepted to [facility].  Plan  Current plan is for [facility]. Accepted by Dr. Abbey Chatters to [facility]. Sheriff to transport. IVC to remain in place. Order placed to this effect.     Margette Fast, MD 10/11/22 1430

## 2022-10-11 NOTE — ED Notes (Signed)
Review of Chart documentation: Confirmed IVC dated 10/09/22, exp. 10/16/22.

## 2022-10-11 NOTE — ED Notes (Signed)
Attempted to call report to [facility] no answer

## 2022-10-11 NOTE — ED Notes (Signed)
IVC paper work Faxed to [facility]

## 2022-11-12 ENCOUNTER — Encounter: Payer: 59 | Admitting: Nurse Practitioner

## 2022-11-14 ENCOUNTER — Encounter (HOSPITAL_COMMUNITY): Payer: Self-pay | Admitting: Registered Nurse

## 2022-11-14 ENCOUNTER — Ambulatory Visit (HOSPITAL_COMMUNITY)
Admission: EM | Admit: 2022-11-14 | Discharge: 2022-11-14 | Disposition: A | Discharge status: Other Acute Inpatient Hospital | Payer: 59 | Attending: Registered Nurse | Admitting: Registered Nurse

## 2022-11-14 ENCOUNTER — Emergency Department (HOSPITAL_COMMUNITY)
Admission: EM | Admit: 2022-11-14 | Discharge: 2022-11-15 | Disposition: A | Discharge status: Other Acute Inpatient Hospital | Payer: 59 | Source: Home / Self Care | Attending: Emergency Medicine | Admitting: Emergency Medicine

## 2022-11-14 ENCOUNTER — Other Ambulatory Visit: Payer: Self-pay

## 2022-11-14 DIAGNOSIS — R451 Restlessness and agitation: Secondary | ICD-10-CM | POA: Insufficient documentation

## 2022-11-14 DIAGNOSIS — Z1152 Encounter for screening for COVID-19: Secondary | ICD-10-CM | POA: Insufficient documentation

## 2022-11-14 DIAGNOSIS — F250 Schizoaffective disorder, bipolar type: Secondary | ICD-10-CM

## 2022-11-14 DIAGNOSIS — F259 Schizoaffective disorder, unspecified: Secondary | ICD-10-CM

## 2022-11-14 DIAGNOSIS — R462 Strange and inexplicable behavior: Secondary | ICD-10-CM | POA: Diagnosis not present

## 2022-11-14 LAB — CBC WITH DIFFERENTIAL/PLATELET
Abs Immature Granulocytes: 0.02 10*3/uL (ref 0.00–0.07)
Basophils Absolute: 0.1 10*3/uL (ref 0.0–0.1)
Basophils Relative: 1 %
Eosinophils Absolute: 0.2 10*3/uL (ref 0.0–0.5)
Eosinophils Relative: 3 %
HCT: 45 % (ref 39.0–52.0)
Hemoglobin: 14.6 g/dL (ref 13.0–17.0)
Immature Granulocytes: 0 %
Lymphocytes Relative: 26 %
Lymphs Abs: 1.9 10*3/uL (ref 0.7–4.0)
MCH: 29.4 pg (ref 26.0–34.0)
MCHC: 32.4 g/dL (ref 30.0–36.0)
MCV: 90.7 fL (ref 80.0–100.0)
Monocytes Absolute: 0.8 10*3/uL (ref 0.1–1.0)
Monocytes Relative: 11 %
Neutro Abs: 4.3 10*3/uL (ref 1.7–7.7)
Neutrophils Relative %: 59 %
Platelets: 190 10*3/uL (ref 150–400)
RBC: 4.96 MIL/uL (ref 4.22–5.81)
RDW: 12.7 % (ref 11.5–15.5)
WBC: 7.3 10*3/uL (ref 4.0–10.5)
nRBC: 0 % (ref 0.0–0.2)

## 2022-11-14 LAB — SALICYLATE LEVEL: Salicylate Lvl: <7 mg/dL — ABNORMAL LOW (ref 7.0–30.0)

## 2022-11-14 LAB — COMPREHENSIVE METABOLIC PANEL
ALT: 12 U/L (ref 0–44)
AST: 23 U/L (ref 15–41)
Albumin: 4.2 g/dL (ref 3.5–5.0)
Alkaline Phosphatase: 68 U/L (ref 38–126)
Anion gap: 10 (ref 5–15)
BUN: 14 mg/dL (ref 6–20)
CO2: 24 mmol/L (ref 22–32)
Calcium: 9.6 mg/dL (ref 8.9–10.3)
Chloride: 107 mmol/L (ref 98–111)
Creatinine, Ser: 0.89 mg/dL (ref 0.61–1.24)
GFR, Estimated: >60 mL/min (ref >60)
Glucose, Bld: 77 mg/dL (ref 70–99)
Potassium: 3.8 mmol/L (ref 3.5–5.1)
Sodium: 141 mmol/L (ref 135–145)
Total Bilirubin: 0.5 mg/dL (ref 0.3–1.2)
Total Protein: 7 g/dL (ref 6.5–8.1)

## 2022-11-14 LAB — ETHANOL: Alcohol, Ethyl (B): <10 mg/dL (ref <10)

## 2022-11-14 LAB — ACETAMINOPHEN LEVEL: Acetaminophen (Tylenol), Serum: <10 ug/mL — ABNORMAL LOW (ref 10–30)

## 2022-11-14 MED ORDER — RISPERIDONE 1 MG PO TBDP
4.0000 mg | ORAL_TABLET | Freq: Two times a day (BID) | ORAL | Status: DC
  Administered 2022-11-15: 4 mg via ORAL
  Filled 2022-11-14 (×2): qty 4

## 2022-11-14 MED ORDER — TRAZODONE HCL 50 MG PO TABS
50.0000 mg | ORAL_TABLET | Freq: Every day | ORAL | Status: DC
  Filled 2022-11-14: qty 1

## 2022-11-14 MED ORDER — HALOPERIDOL LACTATE 5 MG/ML IJ SOLN
5.0000 mg | Freq: Once | INTRAMUSCULAR | Status: AC
  Administered 2022-11-14: 5 mg via INTRAMUSCULAR
  Filled 2022-11-14: qty 1

## 2022-11-14 MED ORDER — LORAZEPAM 2 MG/ML IJ SOLN
2.0000 mg | Freq: Once | INTRAMUSCULAR | Status: AC
  Administered 2022-11-14: 2 mg via INTRAMUSCULAR
  Filled 2022-11-14: qty 1

## 2022-11-14 NOTE — ED Notes (Signed)
Pt attempting to swing from over head light, stripping clothes off, throwing stuff in the hall and at staff. Pt has been redirected and not cooperating. Provider made aware.

## 2022-11-14 NOTE — BH Assessment (Signed)
Comprehensive Clinical Assessment (CCA) Note  11/14/2022 Joah Patlan [id number]  Disposition: Per Assunta Found, NP inpatient treatment is recommended.  [facility] to review.  CSW to pursue appropriate inpatient options.    The patient demonstrates the following risk factors for suicide: Chronic risk factors for suicide include: psychiatric disorder of Schizoaffective Disorder, bipolar type . Acute risk factors for suicide include: family or marital conflict and loss (financial, interpersonal, professional). Protective factors for this patient include: positive social support and responsibility to others (children, family). Considering these factors, the overall suicide risk at this point appears to be UTA. Patient is appropriate for outpatient follow once stabilized.   Patient is a 21 year old male with a history of Schizoaffective Disorder, bipolar type who presents voluntarily via [employer] to [facility] for assessment.  GPD has left the patient and minimal information provided when he arrived. Patient presents in torn pants and without a shirt. He is disorganized, laughing inappropriately and unable to process any questions from staff. His mother presented to [facility] and then to [facility] looking for patient to request the car key, which she believes he has in his belongings. It appears patient's car is abandoned and needing to be moved.  Patient is unable to process a question about giving mother his key. He stared at staff and answered with [employer], which security knew to be [place] [employer]. Patient is unable to engage in assessment at this time d/t current mental state.   Clinician and provider, Assunta Found, NP attempted to reach patient's mother.  Her voicemail has no identifying information and she did not answer the calls.  Clinician contacted [facility], as EHR reflects patient is currently followed by them.  Piedad Climes confirmed patient is one of their clients.  She had limited information, as she is not one  of the RN's, however she was able to message staff to confirm he is Rx Benztropine, Hydroxyzine, Risperdal and Trazadone.  Piedad Climes reports she saw patient last week and he appeared to "be doing good."  He told Piedad Climes that he didn't need ACTT services last week and told her he was taking his medications.  She also confirms patient has never received Risperdal Consta from them, althought EHR reflects he was due for this on 11/8.  Unclear if he got the injection at that time.  @ 1845 clinician was able to reach patient's mother, Mrs. Caryn Bee.   She shares patient was "doing great.  He was sleeping, talking normally and registered for classes last week at [facility]."  Upon discussion of medication compliance, patient's mother was under the impression that since he was "doing well, he would be okay off of the medication."  She states he didn't want to take the "injection on 11/22 since his arm was sore when he had the previous injection."  Clinician provided education regarding the importance on medication compliance for stability.  She stated, "I can't make him take medicine."  She was encouraged to continue to have discussions about the importance of staying on medications for stability.  She then shares she is exhausted and "this is now affecting my life and my mental health."  She reports she is "always worried about him and it's too much."  She expressed interest in out of home placement options if possible.       Chief Complaint: No chief complaint on file.  Visit Diagnosis: Schizoaffective Disorder, bipolar type   PHQ9 - Unable to complete due to patient's current altered mental status  CCA Screening, Triage and Referral (STR)  Patient Reported Information How did you hear about [place]? Legal System  What Is the Reason for Your Visit/Call Today? Patient is a 21 y.o. male with a history of Bipolar Disorder who presents with GPD due to bizarre behavior.  GPD has left the patient and minimal information  provided when he arrived.  Patient presents in torn pants and without a shirt.  He is disorganized, laughing inappropriately and unable to process any questions from staff.   His mother presented to request the car key, which she believes he has in his belongings.  Patient is unable to process a question about giving mother his key.  He stared at staff and answered with [employer], which security knew to be [place] [employer].  Patient is unable to engage in assessment at this time d/t current mental state.  How Long Has This Been Causing You Problems? 1 wk - 1 month  What Do You Feel Would Help You the Most Today? Treatment for Depression or other mood problem   Have You Recently Had Any Thoughts About Hurting Yourself? -- (UTA)  Are You Planning to Commit Suicide/Harm Yourself At This time? -- (UTA)   Flowsheet Row ED from 10/09/2022 in [facility] EMERGENCY DEPARTMENT Admission (Discharged) from [facility] from 09[facility]09/2022 in [facility]  C-SSRS RISK CATEGORY High Risk High Risk High Risk       Have you Recently Had Thoughts About Hurting Someone Else? -- (UTA)  Are You Planning to Harm Someone at This Time? -- (UTA)  Explanation: N/A   Have You Used Any Alcohol or Drugs in the Past 24 Hours? -- (UTA)  What Did You Use and How Much? unknown   Do You Currently Have a Therapist/Psychiatrist? No  Name of Therapist/Psychiatrist:    Have You Been Recently Discharged From Any Office Practice or Programs? Yes  Explanation of Discharge From Practice/Program: d/c from [facility] 10/04/22     CCA Screening Triage Referral Assessment Type of Contact: Face-to-Face  Telemedicine Service Delivery:   Is this Initial or Reassessment?   Date Telepsych consult ordered in [facility]:    Time Telepsych consult ordered in [facility]:    Location of Assessment: [facility]  Provider  Location: [facility]   Collateral Involvement: Medical record   Does Patient Have a Automotive engineer Guardian? No  Legal Guardian Contact Information: N/A  Copy of Legal Guardianship Form: No data recorded Legal Guardian Notified of Arrival: No data recorded Legal Guardian Notified of Pending Discharge: No data recorded If Minor and Not Living with Parent(s), Who has Custody? N/A  Is CPS involved or ever been involved? Never  Is APS involved or ever been involved? Never   Patient Determined To Be At Risk for Harm To Self or Others Based on Review of Patient Reported Information or Presenting Complaint? No  Method: No data recorded Availability of Means: No data recorded Intent: No data recorded Notification Required: No data recorded Additional Information for Danger to Others Potential: Active psychosis  Additional Comments for Danger to Others Potential: Patient is psychotic currently, however not aggressive currently  Are There Guns or Other Weapons in Your Home? -- (UTA)  Types of Guns/Weapons: N/A  Are These Weapons Safely Secured?  No data recorded Who Could Verify You Are Able To Have These Secured: N/A`  Do You Have any Outstanding Charges, Pending Court Dates, Parole/Probation? N/A  Contacted To Inform of Risk of Harm To Self or Others: Family/Significant Other:    Does Patient Present under Involuntary Commitment? No    [place]   Patient Currently Receiving the Following Services: ACTT Psychologist, educational([facility]) (Unclear if patient is currently seeing ACTT providers.)   Determination of Need: Urgent (48 hours)   Options For Referral: Inpatient Hospitalization; BH Urgent Care     CCA Biopsychosocial Patient Reported Schizophrenia/Schizoaffective Diagnosis in Past: Yes   Strengths: Familyis supportive   Mental Health Symptoms Depression:   Difficulty Concentrating;  Irritability   Duration of Depressive symptoms:  Duration of Depressive Symptoms: Greater than two weeks   Mania:   Change in energy/activity; Euphoria; Increased Energy; Racing thoughts; Recklessness   Anxiety:    Worrying; Difficulty concentrating   Psychosis:   Hallucinations; Delusions   Duration of Psychotic symptoms:  Duration of Psychotic Symptoms: Greater than six months   Trauma:   None   Obsessions:   None   Compulsions:   None   Inattention:   None   Hyperactivity/Impulsivity:   None   Oppositional/Defiant Behaviors:   None   Emotional Irregularity:   Mood lability   Other Mood/Personality Symptoms:   None    Mental Status Exam Appearance and self-care  Stature:   Average   Weight:   Average weight   Clothing:   Disheveled; Careless/inappropriate (Scrubs)   Grooming:   Bizarre   Cosmetic use:   None   Posture/gait:   Tense   Motor activity:   Repetitive; Restless   Sensorium  Attention:   Distractible; Unaware   Concentration:   Preoccupied; Focuses on irrelevancies   Orientation:   Object; Person   Recall/memory:   Defective in Immediate; Defective in Short-term; Defective in Recent   Affect and Mood  Affect:   Constricted; Not Congruent   Mood:   Euphoric; Hypomania   Relating  Eye contact:   Fleeting   Facial expression:   Tense   Attitude toward examiner:   Silly; Uninterested   Thought and Language  Speech flow:  Flight of Ideas; Pressured; Loud   Thought content:   Delusions   Preoccupation:   None   Hallucinations:   -- (UTA)   Organization:   Irrelevant; Loose   Company secretaryxecutive Functions  Fund of Knowledge:   Fair   Intelligence:   Average   Abstraction:   Normal   Judgement:   Poor   Reality Testing:   Distorted   Insight:   Lacking   Decision Making:   Impulsive; Vacilates   Social Functioning  Social Maturity:   Irresponsible   Social Judgement:   Impropriety;  Heedless   Stress  Stressors:   Transitions   Coping Ability:   Overwhelmed   Skill Deficits:   Responsibility; Self-control; Interpersonal; Decision making; Communication   Supports:   Family     Religion: Religion/Spirituality Are You A Religious Person?: Yes What is Your Religious Affiliation?: None How Might This Affect Treatment?: NA  Leisure/Recreation: Leisure / Recreation Do You Have Hobbies?: Yes Leisure and Hobbies: UTA  Exercise/Diet: Exercise/Diet Do You Exercise?: No Have You Gained or Lost A Significant Amount of Weight in the Past Six Months?: No Do You Follow a Special Diet?: No Do You Have Any Trouble Sleeping?: Yes Explanation of Sleeping Difficulties: UTA  CCA Employment/Education Employment/Work Situation:    Education:     CCA Family/Childhood History Family and Relationship History: Family history Marital status: Single Does patient have children?: No  Childhood History:  Childhood History By whom was/is the patient raised?: Both parents Did patient suffer any verbal/emotional/physical/sexual abuse as a child?:  (UTA) Did patient suffer from severe childhood neglect?:  (UTA) Has patient ever been sexually abused/assaulted/raped as an adolescent or adult?:  (UTA) Was the patient ever a victim of a crime or a disaster?:  (UTA) Witnessed domestic violence?:  (UTA) Has patient been affected by domestic 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) Description of domestic violence: UTA       CCA Substance Use Alcohol/Drug Use: Alcohol / Drug Use Pain Medications: See MAR Prescriptions: See MAR Over the Counter: See MAR History of alcohol / drug use?: Yes Longest period of sobriety (when/how long): UTA - patient is unable process questions d/t to current mental status                         ASAM's:  Six Dimensions of Multidimensional Assessment  Dimension 1:  Acute Intoxication and/or Withdrawal Potential:      Dimension 2:   Biomedical Conditions and Complications:      Dimension 3:  Emotional, Behavioral, or Cognitive Conditions and Complications:     Dimension 4:  Readiness to Change:     Dimension 5:  Relapse, Continued use, or Continued Problem Potential:     Dimension 6:  Recovery/Living Environment:     ASAM Severity Score:    ASAM Recommended Level of Treatment:     Substance use Disorder (SUD)    Recommendations for Services/Supports/Treatments:    Discharge Disposition:    DSM5 Diagnoses: Patient Active Problem List   Diagnosis Date Noted   Schizoaffective disorder, bipolar type (HCC) 09/24/2022   Schizoaffective disorder (HCC) 08/19/2022   Anxiety state 06/12/2022   Insomnia 06/12/2022   Alcohol use 06/12/2022   Bipolar disorder with psychotic features (HCC) 11/09/2021   Involuntary commitment 11/06/2021   Allergic rhinitis 05/09/2012   Functional heart murmur 04/04/2012     Referrals to Alternative Service(s): Referred to Alternative Service(s):   Place:   Date:   Time:    Referred to Alternative Service(s):   Place:   Date:   Time:    Referred to Alternative Service(s):   Place:   Date:   Time:    Referred to Alternative Service(s):   Place:   Date:   Time:     Yetta Glassman, [facility]

## 2022-11-14 NOTE — ED Notes (Incomplete)
[phone] Meserit pt mother.

## 2022-11-14 NOTE — ED Provider Notes (Signed)
Behavioral Health Urgent Care Medical Screening Exam  Patient Name: Mario Thomas MRN: [id number] Date of Evaluation: 11/14/22 Chief Complaint:   Diagnosis:  Final diagnoses:  None    History of Present illness: Mario Thomas is a 21 y.o. male  with a history of schizoaffective disorder, bipolar type who presents voluntarily as walk in via [employer] ([employer]) to [facility]. GPD left patient with very little information.  Only that patient was brought in related to bizarre behavior.  There is no mention to where patient was picked up from or what the patient was doing.  Per chart review patient has [facility]    Mario Thomas, 21 y.o. seen face to face by this provider, consulted with Dr. Hampton Abbot; and chart reviewed on 11/14/22.  On evaluation Mario Thomas wearing pajama pant with one leg torn down the middle.  He is disorganized, laughing inappropriately and unable to process questions that are asked.  He is pacing the hall and yelling.  He does go in room and sit down when asked to do so.  Whenever he is asked a question, he laughs.  The counselor and I attempted to introduce ourselves to patient, but he only stared at [place] and smiled.  We both attempted to get patient to interact and participate in assessment, but he only stare and laughed.  He would ask irrelevant question "How did you do that" and start laughing again.  Prior to the start of assessment patient was pacing, pulling legs of pajamas up over to calf to make look like wearing a pair of short.  During evaluation Mario Thomas sits in chair when asked.  There is no noted distress.  He is disorganized, unable to process questions, laughing inappropriately, and restless.  He appears to be responding to auditory hallucinations but only laughs when asked.  He is not a historian and attempted to contact his mother. And [facility] for collateral information.  Recommended for inpatient psychiatric  treatment.  Patient meets criteria for involuntary commitment.   Collateral Information:  Spoke to [place] at [employer] (crisis line)  States that she saw Mario Thomas last week and he was doing well.  Reported that patient told her he was taking his medications and going to class.  States he was alert and communication appropriately.  States she is unsure of what could have happen this quickly.    Mario Thomas (TTS counselor) spoke to patients mother who reported that patient has been off of his medication since before Thanksgiving.  Reports that patient was doing well, registering for classes at [facility].  States patient told her that he did not need the medicine because he was doing good, so she did not think he need them.  States he hs not had his long acting injectable because it hurt his arm and he didn't want to take it.    Mario Thomas from 10/09/2022 in [place] Admission (Discharged) from OP Visit from 09/24/2022 in [place] [facility] Thomas from 09/09/2022 in [place] DEPT  C-SSRS RISK CATEGORY High Risk High Risk High Risk       Psychiatric Specialty Exam  Presentation  General Appearance:Disheveled; Other (comment) (Wearing a pair of pajama pants with one leg ripped, no shoes or shirt)  Eye Contact:Good  Speech:Clear and Coherent  Speech Volume:Increased  Handedness:Right   Mood and Affect  Mood: Euphoric; Labile  Affect: Labile   Thought Process  Thought Processes: Disorganized; Irrevelant  Descriptions of Associations:Tangential  Orientation:Other (comment) (Oriented to self)  Thought Content:Illogical  Diagnosis of Schizophrenia or Schizoaffective disorder in past: Yes  Duration of Psychotic Symptoms: Greater than six months  Hallucinations:Other (comment) (Unable to assess.  Patient would not answer only laugh when asked.) The spirits are morphing and coming to take me. Its the day  of santification. Spirits morphing and changing shapes  Ideas of Reference:Other (comment) (Unable to assess. Patient would not answer only laugh when asked.)  Suicidal Thoughts:-- (Unable to assess. Patient would not answer only laugh when asked.) With Plan; Without Intent -- (Not applicable)  Homicidal Thoughts:-- (Unable to assess. Patient would not answer only laugh when asked.) With Intent; With Plan; With Access to Means   Sensorium  Memory: Immediate Poor; Recent Poor; Remote Poor  Judgment: Impaired  Insight: Other (comment) (Unable to assess.)   Executive Functions  Concentration: Poor  Attention Span: Poor  Recall: Poor  Fund of Knowledge: Poor  Language: Poor   Psychomotor Activity  Psychomotor Activity: Restlessness   Assets  Assets: Catering manager; Housing; Social Support; Transportation; Vocational/Educational   Sleep  Sleep: -- (Unable to assess. Patient would not answer only laugh when asked.)  Number of hours:  0 (due to mental condition unable to report)   Nutritional Assessment (For OBS and [facility] admissions only) Has the patient had a weight loss or gain of 10 pounds or more in the last 3 months?: -- (Unable to assess. Patient would not answer only laugh when asked.)    Physical Exam: Physical Exam Vitals and nursing note reviewed.  Eyes:     Conjunctiva/sclera: Conjunctivae normal.  Cardiovascular:     Rate and Rhythm: Normal rate.  Pulmonary:     Effort: Pulmonary effort is normal.  Musculoskeletal:        General: Normal range of motion.     Cervical back: Normal range of motion.  Skin:    General: Skin is warm and dry.  Neurological:     Mental Status: He is alert. He is disoriented.  Psychiatric:        Attention and Perception: He is inattentive.        Mood and Affect: Mood is elated. Affect is labile.        Behavior: Behavior is hyperactive.        Thought Content: Thought content is paranoid.         Judgment: Judgment is impulsive.    Review of Systems  Unable to perform ROS: Acuity of condition (Unable to assess. Patient would not answer only laugh when asked. questions)   Blood pressure 130/83, pulse 72, temperature 97.8 F (36.6 C), temperature source Oral, resp. rate 20, SpO2 100 %. There is no height or weight on file to calculate BMI.  Musculoskeletal: Strength & Muscle Tone: within normal limits Gait & Station: normal Patient leans: N/A   [facility] Discharge Disposition for Follow up and Recommendations: Based on my evaluation the patient does not appear to have an emergency medical condition and can be discharged with resources and follow up care in outpatient services for [place] psychiatric treatment.   IVC petitioned:  Per IVC:  Responded presented to [facility] via [facility] with complaints of bizarre behavior.  Respondent has a history or schizoaffective disorder bipolar type, with multiple psychiatric hospitalizations related to noncompliance with medications.  Currently patient is dressed inappropriately for weather wearing a pair of pajama pants with one  leg ripped, no shirt, or shoes.  He is disorganized, responding to auditory hallucinations, unable to process questions, or information, laughing inappropriately, pacing, yelling "I want to have sex."  Collateral information from the mother of respondent is that he has been off of his psychotropic medications since before Thanksgiving leading to this decompensation of mental health.  At patients' current state he is a danger to himself and other and is recommended for inpatient psychiatric treatment for stabilization and safety.    Since patient has history of Risperidone; Uzedy may be a good medication option since it can be given monthly or every 2 months and he is tolerable to Risperidone already.     Ynez Eugenio, NP 11/14/2022, 6:32 PM

## 2022-11-14 NOTE — Progress Notes (Signed)
   11/14/22 1635  [facility] Triage Screening (Walk-ins at [facility] only)  What Is the Reason for Your Visit/Call Today? Patient is a 21 y.o. male with a history of Bipolar Disorder who presents with GPD due to bizarre behavior.  GPD has left the patient and minimal information provided when he arrived.  Patient presents in torn pants and without a shirt.  He is disorganized, laughing inappropriately and unable to process any questions from staff.   His mother presented to request the car key, which she believes he has in his belongings.  Patient is unable to process a question about giving mother his key.  He stared at staff and answered with [employer], which security knew to be [place] [employer].  Patient is unable to engage in assessment at this time d/t current mental state.  How Long Has This Been Causing You Problems? 1 wk - 1 month  Have You Recently Had Any Thoughts About Hurting Yourself?  (UTA)  Are You Planning to Commit Suicide/Harm Yourself At This time?  (UTA)  Have you Recently Had Thoughts About Hurting Someone Else?  (UTA)  Are You Planning To Harm Someone At This Time?  (UTA)  Are you currently experiencing any auditory, visual or other hallucinations?  (UTA)  Have You Used Any Alcohol or Drugs in the Past 24 Hours?  (UTA)  Do you have any current medical co-morbidities that require immediate attention?  (UTA)  Clinician description of patient physical appearance/behavior: follows basic directives, however unable to answer questions appropriately or engaged in assessment d/t AMS  What Do You Feel Would Help You the Most Today? Treatment for Depression or other mood problem  If access to [facility] was not available, would you have sought care in the Emergency Department? Yes  Determination of Need Urgent (48 hours)  Options For Referral Inpatient Hospitalization;BH Urgent Care

## 2022-11-14 NOTE — ED Notes (Signed)
[employer] transport requested for transport & Serve to [facility]. Charge Nurse Brittnni accepted report.  Dr Theresia Lo Accepting MD.

## 2022-11-14 NOTE — ED Notes (Signed)
Spoke with pt about his car keys.  Pt stated it would be okay for his mother to present to the [facility] and take possession of keys.  Pt unable to describe where car is at at this time.

## 2022-11-14 NOTE — ED Provider Notes (Signed)
[facility] EMERGENCY DEPARTMENT Provider Note   CSN: [id number] Arrival date & time: 11/14/22  2117     History  Chief Complaint  Patient presents with   Agitation    Mario Thomas is a 21 y.o. male.  Patient is a 21 year old male with a past medical history of schizoaffective who presented to the emergency department with agitation.  Per Assunta Found NP at [facility], the patient was brought in under IVC for increasing agitation, responding to internal stimuli and decompensation from his schizoaffective disorder.  No beds were available at [facility] and the patient was transferred here for medical evaluation and will require inpatient management.  Further history is limited from the patient due to his mental status.  The history is provided by medical records. The history is limited by the condition of the patient.       Home Medications Prior to Admission medications   Medication Sig Start Date End Date Taking? Authorizing Provider  benztropine (COGENTIN) 1 MG tablet Take 1 tablet (1 mg total) by mouth 2 (two) times daily. 10/05/22   Starleen Blue, NP  hydrOXYzine (ATARAX) 25 MG tablet Take 1 tablet (25 mg total) by mouth 3 (three) times daily as needed for anxiety. 10/05/22   Starleen Blue, NP  propranolol (INDERAL) 10 MG tablet Take 1 tablet (10 mg total) by mouth 2 (two) times daily. 10/05/22   Starleen Blue, NP  risperiDONE (RISPERDAL M-TABS) 4 MG disintegrating tablet Take 1 tablet (4 mg total) by mouth 2 (two) times daily. 10/05/22   Starleen Blue, NP  risperiDONE microspheres (RISPERDAL CONSTA) 25 MG injection Inject 2 mLs (25 mg total) into the muscle every 14 (fourteen) days. Patient not taking: Reported on 10/09/2022 10/17/22   Starleen Blue, NP  traZODone (DESYREL) 50 MG tablet Take 1 tablet (50 mg total) by mouth at bedtime. 10/05/22   Starleen Blue, NP      Allergies    Patient has no known allergies.    Review of Systems   Review of  Systems  Physical Exam Updated Vital Signs BP 139/86 (BP Location: Left Arm)   Pulse 90   Temp 98 F (36.7 C) (Oral)   Resp 18   Ht 5\' 10"  (1.778 m)   Wt 95.6 kg   SpO2 98%   BMI 30.24 kg/m  Physical Exam Vitals and nursing note reviewed.  Constitutional:      General: He is not in acute distress.    Appearance: Normal appearance.  HENT:     Head: Normocephalic and atraumatic.     Nose: Nose normal.     Mouth/Throat:     Mouth: Mucous membranes are moist.     Pharynx: Oropharynx is clear.  Eyes:     Extraocular Movements: Extraocular movements intact.     Conjunctiva/sclera: Conjunctivae normal.  Cardiovascular:     Rate and Rhythm: Normal rate.     Pulses: Normal pulses.  Pulmonary:     Effort: Pulmonary effort is normal.  Abdominal:     General: Abdomen is flat.     Palpations: Abdomen is soft.  Musculoskeletal:        General: Normal range of motion.     Cervical back: Normal range of motion and neck supple.     Right lower leg: No edema.     Left lower leg: No edema.  Skin:    General: Skin is warm and dry.  Neurological:     General: No focal deficit present.  Mental Status: He is alert.  Psychiatric:     Comments: Responding to internal stimuli, not answering questions appropriately     ED Results / Procedures / Treatments   Labs (all labs ordered are listed, but only abnormal results are displayed) Labs Reviewed  ACETAMINOPHEN LEVEL - Abnormal; Notable for the following components:      Result Value   Acetaminophen (Tylenol), Serum <10 (*)    All other components within normal limits  SALICYLATE LEVEL - Abnormal; Notable for the following components:   Salicylate Lvl <7.0 (*)    All other components within normal limits  COMPREHENSIVE METABOLIC PANEL  ETHANOL  CBC WITH DIFFERENTIAL/PLATELET  RAPID URINE DRUG SCREEN, HOSP PERFORMED    EKG EKG Interpretation  Date/Time:  Wednesday November 14 2022 22:10:08 EST Ventricular Rate:  78 PR  Interval:  165 QRS Duration: 77 QT Interval:  377 QTC Calculation: 430 R Axis:   64 Text Interpretation: Sinus rhythm ST elev, probable normal early repol pattern No significant change since last tracing Confirmed by Elayne Snare ([id number]) on 11/14/2022 10:22:08 PM  Radiology No results found.  Procedures Procedures    Medications Ordered in ED Medications  LORazepam (ATIVAN) injection 2 mg (has no administration in time range)  haloperidol lactate (HALDOL) injection 5 mg (has no administration in time range)  risperiDONE (RISPERDAL M-TABS) disintegrating tablet 4 mg (has no administration in time range)  traZODone (DESYREL) tablet 50 mg (has no administration in time range)    ED Course/ Medical Decision Making/ A&P Clinical Course as of 11/14/22 2315  Wed Nov 14, 2022  2314 Patient is medically cleared for psychiatry disposition.  [VK]    Clinical Course User Index [VK] Rexford Maus, DO                           Medical Decision Making This patient presents to the ED with chief complaint(s) of agitation with pertinent past medical history of schizoaffective which further complicates the presenting complaint. The complaint involves an extensive differential diagnosis and also carries with it a high risk of complications and morbidity.    The differential diagnosis includes decompensated schizoaffective, substance use, no signs of trauma, no other medical complaints from patient   Additional history obtained: Additional history obtained from [facility] NP Records reviewed previous admission documents  ED Course and Reassessment: Patient's arrival to the emergency department he is calm and cooperative however responding to internal stimuli and not answering questions appropriately.  He has no physical complaints and no external signs of trauma.  He will have medical clearance and will require inpatient psych treatment once medically cleared.  He was placed under IVC and is  placed on one-to-one observation.  Independent labs interpretation:  The following labs were independently interpreted: within normal range  Independent visualization of imaging: N/A  Consultation: - Consulted or discussed management/test interpretation w/ external professional: TTS     Amount and/or Complexity of Data Reviewed Labs: ordered.  Risk Prescription drug management.          Final Clinical Impression(s) / ED Diagnoses Final diagnoses:  None    Rx / DC Orders ED Discharge Orders     None         Rexford Maus, DO 11/14/22 2315

## 2022-11-14 NOTE — ED Triage Notes (Addendum)
Pt BIB [facility] from [facility] d/t pt becoming agitated at [place] and saying inappropriate comments. Per [facility], [facility] IVC'd pt. Pt denies HI/SI. VSS

## 2022-11-15 ENCOUNTER — Encounter (HOSPITAL_COMMUNITY): Payer: Self-pay

## 2022-11-15 ENCOUNTER — Inpatient Hospital Stay (HOSPITAL_COMMUNITY)
Admission: AD | Admit: 2022-11-15 | Discharge: 2022-11-22 | DRG: 885 | Disposition: A | Discharge status: Home / Self Care | Payer: 59 | Source: Intra-hospital | Attending: Psychiatry | Admitting: Psychiatry

## 2022-11-15 ENCOUNTER — Encounter (HOSPITAL_COMMUNITY): Payer: Self-pay | Admitting: Registered Nurse

## 2022-11-15 DIAGNOSIS — F419 Anxiety disorder, unspecified: Secondary | ICD-10-CM | POA: Diagnosis present

## 2022-11-15 DIAGNOSIS — F250 Schizoaffective disorder, bipolar type: Secondary | ICD-10-CM | POA: Diagnosis present

## 2022-11-15 DIAGNOSIS — Z20822 Contact with and (suspected) exposure to covid-19: Secondary | ICD-10-CM | POA: Diagnosis present

## 2022-11-15 DIAGNOSIS — Z91148 Patient's other noncompliance with medication regimen for other reason: Secondary | ICD-10-CM

## 2022-11-15 DIAGNOSIS — Z79899 Other long term (current) drug therapy: Secondary | ICD-10-CM

## 2022-11-15 DIAGNOSIS — R462 Strange and inexplicable behavior: Secondary | ICD-10-CM | POA: Diagnosis present

## 2022-11-15 LAB — RAPID URINE DRUG SCREEN, HOSP PERFORMED
Amphetamines: NOT DETECTED
Barbiturates: NOT DETECTED
Benzodiazepines: NOT DETECTED
Cocaine: NOT DETECTED
Opiates: NOT DETECTED
Tetrahydrocannabinol: NOT DETECTED

## 2022-11-15 LAB — SARS CORONAVIRUS 2 BY RT PCR: SARS Coronavirus 2 by RT PCR: NEGATIVE

## 2022-11-15 MED ORDER — BENZTROPINE MESYLATE 1 MG PO TABS
1.0000 mg | ORAL_TABLET | Freq: Four times a day (QID) | ORAL | Status: DC | PRN
  Administered 2022-11-16 – 2022-11-18 (×2): 1 mg via ORAL
  Filled 2022-11-15 (×2): qty 1

## 2022-11-15 MED ORDER — LORAZEPAM 1 MG PO TABS
2.0000 mg | ORAL_TABLET | Freq: Four times a day (QID) | ORAL | Status: DC | PRN
  Administered 2022-11-16 – 2022-11-18 (×2): 2 mg via ORAL
  Filled 2022-11-15 (×2): qty 2

## 2022-11-15 MED ORDER — BENZTROPINE MESYLATE 1 MG PO TABS
1.0000 mg | ORAL_TABLET | Freq: Two times a day (BID) | ORAL | Status: DC
  Administered 2022-11-15 – 2022-11-22 (×14): 1 mg via ORAL
  Filled 2022-11-15 (×20): qty 1

## 2022-11-15 MED ORDER — MAGNESIUM HYDROXIDE 400 MG/5ML PO SUSP: 30.0000 mL | Freq: Every day | ORAL | Status: DC | PRN

## 2022-11-15 MED ORDER — HALOPERIDOL LACTATE 5 MG/ML IJ SOLN
5.0000 mg | Freq: Four times a day (QID) | INTRAMUSCULAR | Status: DC | PRN
  Administered 2022-11-15: 5 mg via INTRAMUSCULAR
  Filled 2022-11-15: qty 1

## 2022-11-15 MED ORDER — BENZTROPINE MESYLATE 1 MG/ML IJ SOLN
1.0000 mg | Freq: Four times a day (QID) | INTRAMUSCULAR | Status: DC | PRN
  Administered 2022-11-15: 1 mg via INTRAMUSCULAR
  Filled 2022-11-15: qty 2

## 2022-11-15 MED ORDER — RISPERIDONE 2 MG PO TBDP
4.0000 mg | ORAL_TABLET | Freq: Two times a day (BID) | ORAL | Status: DC
  Administered 2022-11-15 – 2022-11-18 (×6): 4 mg via ORAL
  Filled 2022-11-15 (×9): qty 2

## 2022-11-15 MED ORDER — HYDROXYZINE HCL 25 MG PO TABS
25.0000 mg | ORAL_TABLET | Freq: Three times a day (TID) | ORAL | Status: DC | PRN
  Administered 2022-11-16 – 2022-11-21 (×9): 25 mg via ORAL
  Filled 2022-11-15 (×10): qty 1

## 2022-11-15 MED ORDER — PROPRANOLOL HCL 10 MG PO TABS
10.0000 mg | ORAL_TABLET | Freq: Two times a day (BID) | ORAL | Status: DC
  Administered 2022-11-15 – 2022-11-19 (×8): 10 mg via ORAL
  Filled 2022-11-15 (×14): qty 1

## 2022-11-15 MED ORDER — ACETAMINOPHEN 325 MG PO TABS: 650.0000 mg | ORAL_TABLET | Freq: Four times a day (QID) | ORAL | Status: DC | PRN

## 2022-11-15 MED ORDER — ALUM & MAG HYDROXIDE-SIMETH 200-200-20 MG/5ML PO SUSP: 30.0000 mL | ORAL | Status: DC | PRN

## 2022-11-15 MED ORDER — LORAZEPAM 2 MG/ML IJ SOLN
2.0000 mg | Freq: Four times a day (QID) | INTRAMUSCULAR | Status: DC | PRN
  Administered 2022-11-15: 2 mg via INTRAVENOUS
  Filled 2022-11-15: qty 1

## 2022-11-15 MED ORDER — TRAZODONE HCL 50 MG PO TABS
50.0000 mg | ORAL_TABLET | Freq: Every day | ORAL | Status: DC
  Administered 2022-11-16 – 2022-11-21 (×7): 50 mg via ORAL
  Filled 2022-11-15 (×13): qty 1

## 2022-11-15 MED ORDER — HALOPERIDOL 5 MG PO TABS
5.0000 mg | ORAL_TABLET | Freq: Four times a day (QID) | ORAL | Status: DC | PRN
  Administered 2022-11-16 – 2022-11-18 (×3): 5 mg via ORAL
  Filled 2022-11-15 (×3): qty 1

## 2022-11-15 NOTE — [facility] Group Notes (Signed)
Pt did not attend group. 

## 2022-11-15 NOTE — BH Specialist Note (Signed)
Pt was accepted to [facility] TODAY 11/15/2022. Bed assignment: 504-2   Sign and Held orders placed by [facility] NP.

## 2022-11-15 NOTE — ED Notes (Signed)
Pt given 3 coloring pages and a box of 6 crayons. Door open, lights on and pt sitting on bed, coloring.

## 2022-11-15 NOTE — ED Provider Notes (Addendum)
Emergency Medicine Observation Re-evaluation Note  Mohsen Ceci is a 21 y.o. male, seen on rounds today.  Pt initially presented to the ED for complaints of Agitation Currently, the patient is eating breakfast.  Physical Exam  BP (!) 140/70 (BP Location: Left Arm)   Pulse 88   Temp (!) 97.5 F (36.4 C) (Oral)   Resp (!) 22   Ht 5\' 10"  (1.778 m)   Wt 95.6 kg   SpO2 99%   BMI 30.24 kg/m  Physical Exam General: No distress Cardiac: Regular rate Lungs: No respiratory distress Psych: calm and cooperative  ED Course / MDM  EKG:EKG Interpretation  Date/Time:  Wednesday November 14 2022 22:10:08 EST Ventricular Rate:  78 PR Interval:  165 QRS Duration: 77 QT Interval:  377 QTC Calculation: 430 R Axis:   64 Text Interpretation: Sinus rhythm ST elev, probable normal early repol pattern No significant change since last tracing Confirmed by [id number] ([id number]) on 11/14/2022 10:22:08 PM  I have reviewed the labs performed to date as well as medications administered while in observation.  Recent changes in the last 24 hours include arrived in ER, medically cleared. Was agitated last night and required medication.  Plan  Current plan is for TTS consult .    14/05/2022, MD 11/15/22 [phone]  Patient recommended for inpatient admission. Accepted by Dr. [id number] at [facility]. Will transfer.     [facility], MD 11/15/22 1012

## 2022-11-15 NOTE — ED Notes (Signed)
PT waiting for [facility] transport to [facility]

## 2022-11-15 NOTE — Tx Team (Addendum)
Initial Treatment Plan 11/15/2022 1:10 PM Mario Thomas [id number]:[id number]    PATIENT STRESSORS: Medication change or noncompliance     PATIENT STRENGTHS: Supportive family/friends    PATIENT IDENTIFIED PROBLEMS: Med noncompliance and active psychosis. Patient unable to identify problems at this time. Patient sexually inappropriate on the unit, disorganized thoughts, and psychotic. Patient reports "I haven't taken any meds since the last time I was here."                     DISCHARGE CRITERIA:  Adequate post-discharge living arrangements Need for constant or close observation no longer present Safe-care adequate arrangements made Verbal commitment to aftercare and medication compliance  PRELIMINARY DISCHARGE PLAN: Outpatient therapy Return to previous living arrangement  PATIENT/FAMILY INVOLVEMENT: This treatment plan has been presented to and reviewed with the patient, Mario Thomas. The patient and family have been given the opportunity to ask questions and make suggestions.  Cherre Blanc, RN 11/15/2022, 1:10 PM

## 2022-11-15 NOTE — Progress Notes (Signed)
   11/15/22 2145  Psych Admission Type (Psych Patients Only)  Admission Status Involuntary  Psychosocial Assessment  Patient Complaints None  Eye Contact Fair  Facial Expression Anxious  Affect Anxious  Speech Incoherent  Interaction Avoidant  Motor Activity Lethargic  Appearance/Hygiene In scrubs  Behavior Characteristics Cooperative  Mood Preoccupied  Aggressive Behavior  Effect No apparent injury  Thought Process  Coherency Disorganized  Content Preoccupation  Delusions None reported or observed  Perception Hallucinations  Hallucination Auditory  Judgment Poor  Confusion Mild  Danger to Self  Current suicidal ideation? Denies  Danger to Others  Danger to Others None reported or observed

## 2022-11-15 NOTE — ED Notes (Signed)
Pt resting at this time. Warm blanket and pillow given to the pt.

## 2022-11-15 NOTE — Progress Notes (Addendum)
Pt was accepted to [facility] TODAY 11/15/2022. Bed assignment: 504-2  Pt meets inpatient criteria per Joaquin Courts, FNP  Attending Physician will be Phineas Inches, MD   Report can be called to: -Adult unit: [phone]  Pt can arrive at anytime today; pending transportation.  Care Team Notified: Joaquin Courts, FNP, [facility], RN, Keenan Bachelor, RN, and [address] LCSWA  [place], [place]  11/15/2022 9:40 AM

## 2022-11-15 NOTE — Plan of Care (Signed)
°  Problem: Activity: °Goal: Will verbalize the importance of balancing activity with adequate rest periods °Outcome: Not Progressing °  °Problem: Education: °Goal: Will be free of psychotic symptoms °Outcome: Not Progressing °Goal: Knowledge of the prescribed therapeutic regimen will improve °Outcome: Not Progressing °  °Problem: Coping: °Goal: Coping ability will improve °Outcome: Not Progressing °Goal: Will verbalize feelings °Outcome: Not Progressing °  °Problem: Health Behavior/Discharge Planning: °Goal: Compliance with prescribed medication regimen will improve °Outcome: Not Progressing °  °Problem: Nutritional: °Goal: Ability to achieve adequate nutritional intake will improve °Outcome: Not Progressing °  °Problem: Role Relationship: °Goal: Ability to communicate needs accurately will improve °Outcome: Not Progressing °Goal: Ability to interact with others will improve °Outcome: Not Progressing °  °Problem: Safety: °Goal: Ability to redirect hostility and anger into socially appropriate behaviors will improve °Outcome: Not Progressing °Goal: Ability to remain free from injury will improve °Outcome: Not Progressing °  °Problem: Self-Care: °Goal: Ability to participate in self-care as condition permits will improve °Outcome: Not Progressing °  °Problem: Self-Concept: °Goal: Will verbalize positive feelings about self °Outcome: Not Progressing °  °Problem: Education: °Goal: Knowledge of [facility] Education information/materials will improve °Outcome: Not Progressing °Goal: Emotional status will improve °Outcome: Not Progressing °Goal: Mental status will improve °Outcome: Not Progressing °Goal: Verbalization of understanding the information provided will improve °Outcome: Not Progressing °  °Problem: Activity: °Goal: Interest or engagement in activities will improve °Outcome: Not Progressing °Goal: Sleeping patterns will improve °Outcome: Not Progressing °  °Problem: Coping: °Goal: Ability to verbalize  frustrations and anger appropriately will improve °Outcome: Not Progressing °Goal: Ability to demonstrate self-control will improve °Outcome: Not Progressing °  °Problem: Health Behavior/Discharge Planning: °Goal: Identification of resources available to assist in meeting health care needs will improve °Outcome: Not Progressing °Goal: Compliance with treatment plan for underlying cause of condition will improve °Outcome: Not Progressing °  °Problem: Physical Regulation: °Goal: Ability to maintain clinical measurements within normal limits will improve °Outcome: Not Progressing °  °Problem: Safety: °Goal: Periods of time without injury will increase °Outcome: Not Progressing °  °

## 2022-11-15 NOTE — Progress Notes (Signed)
Pt admitted to [facility] from [facility] under IVC status where he presented initially, disorganized with bizarre behavior, inappropriate laughter in torn pants and unable to process assessment questions. Per pt "I hang myself at the other hospital last night. I didn't take no medications when I left. I didn't take anything" when asked about events leading to admission. Pt remains disorganized talking to unseen others others with inappropriate laughter. Observed to be sexually inappropriate as well with gestures in chair in search room, shadow boxing, yelling in dayroom and throwing Bible in hall and is intrusive on arrival to unit. Affect is animated, euphoric with intense stares, he's hyperactive / restless on interactions. Denies SI, HI and pain when assessed. Skin assessment done without areas of breakdown to note. Belongings searched, items deemed contraband secure in assigned locker. Safety checks maintained at Q 15 minutes intervals. Pt continues to need multiple verbal redirections for intrusiveness sexual and karate gestures towards others. Emotional support, encouragement and reassurance provided to pt. PRN Ativan, Haldol & Cogentin (See EMAR) given for continued escalation in behavior. Pt tolerated meals, fluids and medications well.

## 2022-11-15 NOTE — ED Notes (Signed)
Pt changed into purple scrubs and food given.

## 2022-11-16 ENCOUNTER — Encounter (HOSPITAL_COMMUNITY): Payer: Self-pay

## 2022-11-16 MED ORDER — RISPERIDONE 2 MG PO TBDP
2.0000 mg | ORAL_TABLET | Freq: Every day | ORAL | Status: DC
  Administered 2022-11-16: 2 mg via ORAL
  Filled 2022-11-16 (×3): qty 1

## 2022-11-16 MED ORDER — RISPERIDONE 1 MG PO TBDP
1.0000 mg | ORAL_TABLET | Freq: Every morning | ORAL | Status: DC
  Administered 2022-11-17: 1 mg via ORAL
  Filled 2022-11-16 (×4): qty 1

## 2022-11-16 NOTE — [facility] Suicide Risk Assessment (Signed)
Suicide Risk Assessment  Admission Assessment    [facility] Admission Suicide Risk Assessment   Nursing information obtained from:  Patient Demographic factors:  Male, Adolescent or young adult Current Mental Status:  NA Loss Factors:  NA Historical Factors:  NA Risk Reduction Factors:  Living with another person, especially a relative, Positive social support, Sense of responsibility to family  Total Time spent with patient: 45 minutes Principal Problem: Schizoaffective disorder, bipolar type (HCC) Diagnosis:  Principal Problem:   Schizoaffective disorder, bipolar type (HCC)   Subjective Data:  CC: "The devil makes me sick" Mario Thomas is a 21 yo male with documented PPHx of Bipolar I disorder, schizophrenia, MDD, GAD, and previous IVCs who presents involuntarily under IVC for bizarre, erratic behavior in the setting of medication non-compliance.    Mode of transport to Hospital: Transported to [facility] via [facility]   PRN medication prior to evaluation:  For agitation: Haldol 5 mg, benztropine 1 mg, Ativan 2 mg   ED course: Patient presented to [facility] via [facility] for erratic, bizzare behavior.  Per collateral information collected at [facility], mother reports that he has been off his psychotropic medications since before Thanksgiving. Collateral Information: Attempted to contact mother, Felecia Shelling, at [phone].  POA/Legal Guardian: None   HPI:  Patient evaluated on the unit, states "the devil is making me sick".  Patient believes that the devil is making him lose his mind, is the cause for all of his recent difficulties.  The patient is unable to elaborate on said difficulties.  He denies any suicidal ideation.  He expresses guilt for things he has done, things he says he is ashamed of.  When asked about drug history, patient says he enjoys inhaling his sisters underwear.  Patient is difficult to redirect at this time and perseverates on how his obsession with smelling his sisters underwear has caused  him to become schizophrenic.  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  He denies any symptoms of anxiety.  Patient is unable to provide an accurate timeline of the onset of his symptoms, he is unable to endorse any symptoms of mania given his distractibility.   Patient denies any auditory hallucinations.  He denies any visual hallucinations.  There are no apparent paranoid ideations.   Past Psychiatric Hx: Previous Psych Diagnoses: Per chart review, there are past diagnoses of bipolar type I, GAD, MDD Prior inpatient treatment: Per chart review,  [facility] from 11/09/21 under IVC through 11/17/2021 for manic type behaviors, and from 06/11/22 through 06/20/22 for bizarre behaviors and making threats to his parents.   Current/prior outpatient treatment: Unable to assess Prior rehab hx: unable to assess  Psychotherapy hx: unable to asses  History of suicide: Denies History of homicide or aggression: Denies Psychiatric medication history: LAI with Risperdal Concerta 10/03/2022 25 mg IM initiated during [facility] admission at that time Psychiatric medication compliance history: Patient has history of medication noncompliance, unable to assess the reasons behind this. Current Psychiatrist: Unable to assess Current therapist: Unable to assess   Substance Abuse Hx: Alcohol: Patient endorses drinking alcohol when he was in college, had a period of binge drinking a couple of months ago.  Otherwise denies chronic use of alcohol. Tobacco: Denies, says "I wish" Illicit drugs: Unable to assess, patient says he likes inhaling several things, one of which includes his sister's panties. Rx drug abuse: Unable to assess Rehab hx: No rehab history found  in chart review   Past Medical History: Medical Diagnoses: Patient denies any medical diagnoses Home Rx: Denies Prior Hosp: Denies Prior Surgeries/Trauma: Patient reports knee injury,  confirmed on chart review and ankle sprain in 2014 Head trauma, LOC, concussions, seizures: Denies Allergies: Denies     Family History: Medical: Denies Psych: Reports and month who committed suicide.  Otherwise denies any psychiatric history in his family Psych Rx: Denies SA/HA: Denies Substance use family hx: Denies   Social History: Childhood (bring, raised, lives now, parents, siblings, schooling, education): Abuse: Denies any history of abuse Marital Status: Single Children: Reports having a very happy childhood in which she was given a lot of candy. Employment: Reports that he works for [employer]: Identifies his sister and cousin as his support group. Housing: Lives with his parents Legal: Unable to assess, no past legal charges found on chart review Military: No  Continued Clinical Symptoms:  Alcohol Use Disorder Identification Test Final Score (AUDIT): 0 The "Alcohol Use Disorders Identification Test", Guidelines for Use in Primary Care, Second Edition.  World Science writer [facility]). Score between 0-7:  no or low risk or alcohol related problems. Score between 8-15:  moderate risk of alcohol related problems. Score between 16-19:  high risk of alcohol related problems. Score 20 or above:  warrants further diagnostic evaluation for alcohol dependence and treatment.   CLINICAL FACTORS:  Currently psychotic, schizoaffective bipolar type.   Musculoskeletal: Strength & Muscle Tone: within normal limits Gait & Station: normal Patient leans: N/A  Psychiatric Specialty Exam: See H&P  Physical Exam: See H&P   COGNITIVE FEATURES THAT CONTRIBUTE TO RISK:  Loss of executive function    SUICIDE RISK:   Moderate:  Frequent suicidal ideation with limited intensity, and duration, some specificity in terms of plans, no associated intent, good self-control, limited dysphoria/symptomatology, some risk factors present, and identifiable protective factors,  including available and accessible social support.  PLAN OF CARE: See H&P for assessment and plan.   I certify that inpatient services furnished can reasonably be expected to improve the patient's condition.   Lorri Frederick, MD 11/16/2022, 5:11 PM

## 2022-11-16 NOTE — Progress Notes (Signed)
Pt came out the room in the morning talking bizarre, pt given PRN Haldol, Ativan, and Cogentin per MAR. Pt walking back to his room appeared to trow up clear liquid. Un sure if pt medication came up. Pt continues to be bizarre at this time "Are we humans, Is Iona Coach real"
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
## 2022-11-16 NOTE — Group Note (Signed)
LCSW Group Therapy Note   Group Date: 11/16/2022 Start Time: 1100 End Time: 1200   Type of Therapy and Topic:  Group Therapy: Are You Under Stress?!?!  Participation Level:  Active  Description of Group: This process group involved patients examining stress and how it impacts their lives. Distress and eustress definitions were explained and patients identified both good and bad stressors in their lives. Accompanying worksheet was used to help patients identify the physical and emotional symptoms of stress and techniques/copings skills they utilize to help reduce these symptoms.    Therapeutic Goals:  1.  Patients will talk about their experiences with distress and eustress. 2. Patients will identify stressors in their lives and the physical and emotional  symptoms they experience while under stress. 3. Patients to identify actions/coping skills that they utilize to help ameliorate  symptoms of stress.  Summary of Patient Progress:   Patient was active and answered all questions in group. Patient provided positive feedback and insight on his own definition of stress and how he copes with stress. Patient was encouraging others during group, respectful, and in pleasant spirits. Patient accepted worksheet and followed along. Patient remained in group the entire time.    Therapeutic Modalities:  Cognitive Behavioral Therapy Solution-Focused Therapy  Isabella Bowens LCSW 11/16/2022 2:30 PM

## 2022-11-16 NOTE — Progress Notes (Signed)
Recreation Therapy Notes  Patient admitted to unit 12.7.2023. Due to admission within last year, no new recreation therapy assessment conducted at this time. Last assessment conducted on 7.52023.    Reason for current admission per patient, "I don't remember".  Patient reports no stressors at this time.  Patient identified strengths as perseverance,   Patient identified areas of improvement as pray more and having faith in God.  Patient reports goal of "work on discharge, have a good time here, learn and grow".  Patient denies SI, HI, AVH at this time.    Information found below from assessment conducted 7.5.2023.  Coping Skills: Sports, TV, Music, Exercise, Meditate, Deep Breathing, Prayer, Talk, Avoidance, Read, Hot Bath/Shower  Leisure Interests: Reading, Explore Topics, Technology  Patient Strengths: Open Minded, Learned to do things that are necessary  Areas of Improvement: Patience, Listen More   Shantai Tiedeman-McCall, LRT,CTRS Kaylean Tupou A Jacaria Colburn-McCall 11/16/2022 11:53 AM

## 2022-11-16 NOTE — Group Note (Signed)
Recreation Therapy Group Note   Group Topic:Self-Esteem  Group Date: 11/16/2022 Start Time: 1005 End Time: 1025 Facilitators: Gera Inboden-McCall, LRT,CTRS Location: [address] Day[address]   Goal Area(s) Addresses:  Patient will successfully identify positive attributes about themselves.  Patient will identify healthy ways to increase self-esteem. Patient will acknowledge benefit(s) of improved self-esteem.   Group Description:  My Strengths and Qualities.  LRT discussed what self esteem is and why it is important.  Patients were then given a worksheet where they were to identify things they are good at, what they like about their appearance, how they've helped others, what they value, compliments received, challenges overcome, things that make them unique and times they've made others happy.   Affect/Mood: Anxious   Participation Level: Engaged   Participation Quality: Independent   Behavior: Appropriate   Speech/Thought Process: Irrational and Rational   Insight: Fair   Judgement: Fair    Modes of Intervention: Worksheet   Patient Response to Interventions:  Engaged   Education Outcome:  Acknowledges education and In group clarification offered    Clinical Observations/Individualized Feedback: Pt identified self esteem as "how you feel about yourself".  Pt stated self esteem is important because "you feel better and other people around you feel better".  Pt identified his positive qualities.  Some of pt answers made sense, others seemed irrational.  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       Plan: Continue to engage patient in RT group sessions 2-3x/week.   Grant Swager-McCall, LRT,CTRS 11/16/2022 10:50 AM

## 2022-11-16 NOTE — Progress Notes (Signed)
Patient observed with suspicious and anxious affect, appeared to be responding to internal stimuli with auditory hallucination. Patient denies SI, HI, and pain. Patient up and walked to the med room for his HS meds, patient tolerated scheduled medications without issues. Safety checks maintained at Q 15 minutes, patient kept safe on  the unit.

## 2022-11-16 NOTE — H&P (Signed)
Psychiatric Admission Assessment Adult  Patient Identification: Mario Thomas MRN:  [id number] Date of Evaluation:  11/16/2022 Chief Complaint:  Schizoaffective disorder, bipolar type (HCC) [F25.0] Principal Diagnosis: Schizoaffective disorder, bipolar type (HCC) Diagnosis:  Principal Problem:   Schizoaffective disorder, bipolar type (HCC)    CC: "The devil makes me sick" Mario Thomas is a 21 yo male with documented PPHx of Bipolar I disorder, schizophrenia, MDD, GAD, and previous IVCs who presents involuntarily under IVC for bizarre, erratic behavior in the setting of medication non-compliance.   Mode of transport to Hospital: [facility]  PRN medication prior to evaluation:  For agitation: Haldol 5 mg, benztropine 1 mg, Ativan 2 mg  ED course: [facility] for erratic, bizzare behavior.  Per collateral [facility], mother reports that he has been off his psychotropic medications since before Thanksgiving. Collateral Information: Attempted to contact mother, Felecia ShellingMeseret Senebeta, at [phone].  POA/Legal Guardian: None  HPI:  Patient evaluated on the unit, states "the devil is making me sick".  Patient believes that the devil is making him lose his mind, is the cause for all of his recent difficulties.  The patient is unable to elaborate on said difficulties.  He denies any suicidal ideation.  He expresses guilt for things he has done, things he says he is ashamed of.  When asked about drug history, patient says he enjoys inhaling his sisters underwear.  Patient is difficult to redirect at this time and perseverates on how his obsession with smelling his sisters underwear has caused him to become schizophrenic.  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  He denies any symptoms of anxiety.  Patient is unable to provide an accurate timeline of the onset of his symptoms,  he is unable to endorse any symptoms of mania given his distractibility.  Patient denies any auditory hallucinations.  He denies any visual hallucinations.  There are no apparent paranoid ideations.  Past Psychiatric Hx: Previous Psych Diagnoses: Per chart review, there are past diagnoses of bipolar type I, GAD, MDD Prior inpatient treatment: Per chart review,  [facility] from 11/09/21 under IVC through 11/17/2021 for manic type behaviors, and from 06/11/22 through 06/20/22 for bizarre behaviors and making threats to his parents.   Current/prior outpatient treatment: Unable to assess Prior rehab hx: unable to assess  Psychotherapy hx: unable to asses  History of suicide: Denies History of homicide or aggression: Denies Psychiatric medication history: LAI with Risperdal Concerta 10/03/2022 25 mg IM [facility] Psychiatric medication compliance history: Patient has history of medication noncompliance, unable to assess the reasons behind this. Current Psychiatrist: Unable to assess Current therapist: Unable to assess  Substance Abuse Hx: Alcohol: Patient endorses drinking alcohol when he was in college, had a period of binge drinking a couple of months ago.  Otherwise denies chronic use of alcohol. Tobacco: Denies, says "I wish" Illicit drugs: Unable to assess, patient says he likes inhaling several things, one of which includes his sister's panties. Rx drug abuse: Unable to assess Rehab hx: No rehab history found in chart review  Past Medical History: Medical Diagnoses: Patient denies any medical diagnoses Home Rx: Denies Prior Hosp: Denies Prior Surgeries/Trauma: Patient reports knee injury, confirmed on chart review and ankle sprain in 2014 Head trauma, LOC, concussions, seizures: Denies Allergies: Denies   Family History: Medical: Denies Psych: Reports and month  who committed suicide.  Otherwise denies any psychiatric history in his family Psych Rx:  Denies SA/HA: Denies Substance use family hx: Denies  Social History: Childhood (bring, raised, lives now, parents, siblings, schooling, education): Abuse: Denies any history of abuse Marital Status: Single Children: Reports having a very happy childhood in which she was given a lot of candy. Employment: Reports that he works for [employer]: Identifies his sister and cousin as his support group. Housing: Lives with his parents Legal: Unable to assess, no past legal charges found on chart review Military: No   Total Time spent with patient: 45 minutes  Is the patient at risk to self? Yes.    Has the patient been a risk to self in the past 6 months? Yes.    Has the patient been a risk to self within the distant past? Yes.    Is the patient a risk to others? No.  Has the patient been a risk to others in the past 6 months? No.  Has the patient been a risk to others within the distant past? Yes.     [place] Scale:  Flowsheet Row [facility]) [facility] 11[facility]14[facility]2022 in [facility]PART[facility] ED from 10/09/2022 in [facility]MENT  C-SSRS RISK CATEGORY No Risk No Risk High Risk        Tobacco Screening:  Social History   Tobacco Use  Smoking Status Never  Smokeless Tobacco Never    BH Tobacco Counseling     Are you interested in Tobacco Cessation Medications?  No, patient refused Counseled patient on smoking cessation:  Refused/Declined practical counseling Reason Tobacco Screening Not Completed: Patient Refused Screening       Social History:  Social History   Substance and Sexual Activity  Alcohol Use Not Currently     Social History   Substance and Sexual Activity  Drug Use Never   Comment: Denies    Allergies:  No Known Allergies Lab Results:  Results for orders placed or performed during the hospital encounter of 11/14/22  (from the past 48 hour(s))  Comprehensive metabolic panel     Status: None   Collection Time: 11/14/22  9:39 PM  Result Value Ref Range   Sodium 141 135 - 145 mmol/L   Potassium 3.8 3.5 - 5.1 mmol/L   Chloride 107 98 - 111 mmol/L   CO2 24 22 - 32 mmol/L   Glucose, Bld 77 70 - 99 mg/dL    Comment: Glucose reference range applies only to samples taken after fasting for at least 8 hours.   BUN 14 6 - 20 mg/dL   Creatinine, Ser 2.42 0.61 - 1.24 mg/dL   Calcium 9.6 8.9 - 35.3 mg/dL   Total Protein 7.0 6.5 - 8.1 g/dL   Albumin 4.2 3.5 - 5.0 g/dL   AST 23 15 - 41 U/L   ALT 12 0 - 44 U/L   Alkaline Phosphatase 68 38 - 126 U/L   Total Bilirubin 0.5 0.3 - 1.2 mg/dL   GFR, Estimated >61 >44 mL/min    Comment: (NOTE) Calculated using the CKD-EPI Creatinine Equation (2021)    Anion gap 10 5 - 15    Comment: Performed at [facility], [address], [place], [place] [address]  Ethanol     Status: None   Collection Time: 11/14/22  9:39 PM  Result Value Ref Range   Alcohol,  Ethyl (B) <10 <10 mg/dL    Comment: (NOTE) Lowest detectable limit for serum alcohol is 10 mg/dL.  For medical purposes only. Performed at [facility], [address] [place], [place] [address]   Urine rapid drug screen (hosp performed)     Status: None   Collection Time: 11/14/22  9:39 PM  Result Value Ref Range   Opiates NONE DETECTED NONE DETECTED   Cocaine NONE DETECTED NONE DETECTED   Benzodiazepines NONE DETECTED NONE DETECTED   Amphetamines NONE DETECTED NONE DETECTED   Tetrahydrocannabinol NONE DETECTED NONE DETECTED   Barbiturates NONE DETECTED NONE DETECTED    Comment: (NOTE) DRUG SCREEN FOR MEDICAL PURPOSES ONLY.  IF CONFIRMATION IS NEEDED FOR ANY PURPOSE, NOTIFY LAB WITHIN 5 DAYS.  LOWEST DETECTABLE LIMITS FOR URINE DRUG SCREEN Drug Class                     Cutoff (ng/mL) Amphetamine and metabolites    1000 Barbiturate and metabolites    200 Benzodiazepine                  200 Opiates and metabolites        300 Cocaine and metabolites        300 THC                            50 Performed at [facility], [address] [place], [place] [address]   CBC with Diff     Status: None   Collection Time: 11/14/22  9:39 PM  Result Value Ref Range   WBC 7.3 4.0 - 10.5 K/uL   RBC 4.96 4.22 - 5.81 MIL/uL   Hemoglobin 14.6 13.0 - 17.0 g/dL   HCT 21.3 08.6 - 57.8 %   MCV 90.7 80.0 - 100.0 fL   MCH 29.4 26.0 - 34.0 pg   MCHC 32.4 30.0 - 36.0 g/dL   RDW 46.9 62.9 - 52.8 %   Platelets 190 150 - 400 K/uL   nRBC 0.0 0.0 - 0.2 %   Neutrophils Relative % 59 %   Neutro Abs 4.3 1.7 - 7.7 K/uL   Lymphocytes Relative 26 %   Lymphs Abs 1.9 0.7 - 4.0 K/uL   Monocytes Relative 11 %   Monocytes Absolute 0.8 0.1 - 1.0 K/uL   Eosinophils Relative 3 %   Eosinophils Absolute 0.2 0.0 - 0.5 K/uL   Basophils Relative 1 %   Basophils Absolute 0.1 0.0 - 0.1 K/uL   Immature Granulocytes 0 %   Abs Immature Granulocytes 0.02 0.00 - 0.07 K/uL    Comment: Performed at [facility], [address], [place], [place] [address]  Acetaminophen level     Status: Abnormal   Collection Time: 11/14/22  9:39 PM  Result Value Ref Range   Acetaminophen (Tylenol), Serum <10 (L) 10 - 30 ug/mL    Comment: (NOTE) Therapeutic concentrations vary significantly. A range of 10-30 ug/mL  may be an effective concentration for many patients. However, some  are best treated at concentrations outside of this range. Acetaminophen concentrations >150 ug/mL at 4 hours after ingestion  and >50 ug/mL at 12 hours after ingestion are often associated with  toxic reactions.  Performed at [facility], [address], [place], [place] [address]   Salicylate level     Status: Abnormal   Collection Time: 11/14/22  9:39 PM  Result Value  Ref Range   Salicylate Lvl <7.0 (L) 7.0 - 30.0 mg/dL    Comment: Performed at [facility], [address] [place], [place] [address]  SARS  Coronavirus 2 by RT PCR (hospital order, performed in [facility] hospital lab) *cepheid single result test* Anterior Nasal Swab     Status: None   Collection Time: 11/15/22  6:39 AM   Specimen: Anterior Nasal Swab  Result Value Ref Range   SARS Coronavirus 2 by RT PCR NEGATIVE NEGATIVE    Comment: (NOTE) SARS-CoV-2 target nucleic acids are NOT DETECTED.  The SARS-CoV-2 RNA is generally detectable in upper and lower respiratory specimens during the acute phase of infection. The lowest concentration of SARS-CoV-2 viral copies this assay can detect is 250 copies / mL. A negative result does not preclude SARS-CoV-2 infection and should not be used as the sole basis for treatment or other patient management decisions.  A negative result may occur with improper specimen collection / handling, submission of specimen other than nasopharyngeal swab, presence of viral mutation(s) within the areas targeted by this assay, and inadequate number of viral copies (<250 copies / mL). A negative result must be combined with clinical observations, patient history, and epidemiological information.  Fact Sheet for Patients:   RoadLapTop.co.za  Fact Sheet for Healthcare Providers: http://kim-miller.com/  This test is not yet approved or  cleared by the [place] FDA and has been authorized for detection and/or diagnosis of SARS-CoV-2 by FDA under an Emergency Use Authorization (EUA).  This EUA will remain in effect (meaning this test can be used) for the duration of the COVID-19 declaration under Section 564(b)(1) of the Act, 21 U.S.C. section 360bbb-3(b)(1), unless the authorization is terminated or revoked sooner.  Performed at [facility], [address], [place], [place] [address]     Blood Alcohol level:  Lab Results  Component Value Date   [facility] <10 11/14/2022   ETH <10 10/09/2022    Metabolic Disorder Labs:  Lab Results  Component  Value Date   HGBA1C 4.8 09/25/2022   MPG 91.06 09/25/2022   MPG 93.93 05/28/2022   Lab Results  Component Value Date   PROLACTIN 10.0 08/24/2018   Lab Results  Component Value Date   CHOL 122 09/25/2022   TRIG 27 09/25/2022   HDL 42 09/25/2022   CHOLHDL 2.9 09/25/2022   VLDL 5 09/25/2022   LDLCALC 75 09/25/2022   LDLCALC 41 05/28/2022    Current Medications: Current Facility-Administered Medications  Medication Dose Route Frequency Provider Last Rate Last Admin   acetaminophen (TYLENOL) tablet 650 mg  650 mg Oral Q6H PRN Adegbola, Bukola O, NP       alum & mag hydroxide-simeth (MAALOX/MYLANTA) 200-200-20 MG/5ML suspension 30 mL  30 mL Oral Q4H PRN Adegbola, Bukola O, NP       benztropine (COGENTIN) tablet 1 mg  1 mg Oral BID Hill, Shelbie Hutching, MD   1 mg at 11/16/22 0811   haloperidol (HALDOL) tablet 5 mg  5 mg Oral Q6H PRN Roselle Locus, MD   5 mg at 11/16/22 0400   And   benztropine (COGENTIN) tablet 1 mg  1 mg Oral Q6H PRN Roselle Locus, MD   1 mg at 11/16/22 0400   And   LORazepam (ATIVAN) tablet 2 mg  2 mg Oral Q6H PRN Roselle Locus, MD   2 mg at 11/16/22 0400   haloperidol lactate (HALDOL) injection 5 mg  5 mg Intramuscular Q6H  PRN Roselle Locus, MD   5 mg at 11/15/22 1258   And   benztropine mesylate (COGENTIN) injection 1 mg  1 mg Intramuscular Q6H PRN Roselle Locus, MD   1 mg at 11/15/22 1258   And   LORazepam (ATIVAN) injection 2 mg  2 mg Intravenous Q6H PRN Roselle Locus, MD   2 mg at 11/15/22 1258   hydrOXYzine (ATARAX) tablet 25 mg  25 mg Oral TID PRN Adin Hector, NP   25 mg at 11/16/22 0050   magnesium hydroxide (MILK OF MAGNESIA) suspension 30 mL  30 mL Oral Daily PRN Adegbola, Denny Levy, NP       propranolol (INDERAL) tablet 10 mg  10 mg Oral BID Roselle Locus, MD   10 mg at 11/16/22 4097   risperiDONE (RISPERDAL M-TABS) disintegrating tablet 4 mg  4 mg Oral BID Roselle Locus, MD   4 mg at  11/16/22 3532   traZODone (DESYREL) tablet 50 mg  50 mg Oral QHS Hill, Shelbie Hutching, MD   50 mg at 11/16/22 0050   PTA Medications: Medications Prior to Admission  Medication Sig Dispense Refill Last Dose   benztropine (COGENTIN) 1 MG tablet Take 1 tablet (1 mg total) by mouth 2 (two) times daily. 60 tablet 0    hydrOXYzine (ATARAX) 25 MG tablet Take 1 tablet (25 mg total) by mouth 3 (three) times daily as needed for anxiety. 30 tablet 0    propranolol (INDERAL) 10 MG tablet Take 1 tablet (10 mg total) by mouth 2 (two) times daily. 60 tablet 0    risperiDONE (RISPERDAL M-TABS) 4 MG disintegrating tablet Take 1 tablet (4 mg total) by mouth 2 (two) times daily. 45 tablet 0    risperiDONE microspheres (RISPERDAL CONSTA) 25 MG injection Inject 2 mLs (25 mg total) into the muscle every 14 (fourteen) days. (Patient not taking: Reported on 11/14/2022) 1 each 0    traZODone (DESYREL) 50 MG tablet Take 1 tablet (50 mg total) by mouth at bedtime. 30 tablet 0     Musculoskeletal: Strength & Muscle Tone: within normal limits Gait & Station: normal Patient leans: N/A  Psychiatric Specialty Exam:  Presentation  General Appearance: Dressed appropriately, fairly groomed Eye Contact:Good Speech:clear and coherent, normal rate Speech Volume:normal Handedness: not assessed  Mood and Affect  Mood:"my heart is broken" Affect:Flat, non-congruent, smiles inappropriately  Thought Process  Thought Processes:Non-linear, disorganized Descriptions of Associations:Tangential, loose Orientation: Full (time, place, and person) Thought Content: delusions, illogical, disorganized, scattered History of Schizophrenia/Schizoaffective disorder: Yes Duration of Psychotic Symptoms:Greater than 6 months Hallucinations:Denies  Ideas of Reference: Delusions Suicidal Thoughts:Denies Homicidal Thoughts: No   Sensorium  Memory: Immediate, recent, remote are all poor Judgment:Impaired Insight:None  Executive  Functions  Concentration: Poor Attention Span: Poor Recall: Poor Fund of Knowledge: Poor Language: Poor  Psychomotor Activity  Psychomotor Activity: Normal  Assets  Assets: Social support  Sleep  Sleep: 7 hours    Physical Exam Constitutional:      Appearance: He is normal weight.  Pulmonary:     Effort: Pulmonary effort is normal. No respiratory distress.  Musculoskeletal:        General: Normal range of motion.  Neurological:     General: No focal deficit present.     Mental Status: He is alert.     Cranial Nerves: No cranial nerve deficit.  Psychiatric:        Attention and Perception: He is inattentive.  Mood and Affect: Affect is inappropriate.        Cognition and Memory: Cognition is impaired.        Judgment: Judgment is inappropriate. Blood pressure 137/75, pulse 72, temperature 98 F (36.7 C), temperature source Oral, resp. rate 20, height  (1.778 m), weight 97.5 kg, SpO2 100 %. Body mass index is 30.85 kg/m.    Treatment Plan Summary: Daily contact with patient to assess and evaluate symptoms and progress in treatment and Medication management     ASSESSMENT: Mario Thomas is a 20 yo male with documented PPHx of Bipolar I disorder, schizophrenia, MDD, GAD, and previous IVCs who presents involuntarily under IVC for bizarre, erratic behavior in the setting of medication non-compliance.  Patient appears to be acutely psychotic, with disorganized thoughts, he is illogical and delusional.  Makes sexually inappropriate comments, is fixated on telling this interviewer that he enjoyed smelling his sisters underwear.  He denies any perceptual disturbances, did not seem internally preoccupied.  Patient appears to meet criteria for schizoaffective disorder bipolar type as there is a major mood component concurrent with his delusions, disorganized speech, and negative symptoms. There is an extensive history of medication noncompliance, per previous notes there have  been plans to place patient on LAI and has refused injections.   Diagnoses / Active Problems: Schizoaffective Disorder, Bipolar Type  PLAN: Safety and Monitoring:  -- Involuntary admission to inpatient psychiatric unit for safety, stabilization and treatment  -- Daily contact with patient to assess and evaluate symptoms and progress in treatment  -- Patient's case to be discussed in multi-disciplinary team meeting  -- Observation Level : q15 minute checks  -- Vital signs:  q12 hours  -- Precautions: suicide, elopement, and assault  2. Psychiatric Diagnoses and Treatment:   Start Risperdal 1 mg daily and 2 mg nightly -- The risks/benefits/side-effects/alternatives to this medication were discussed in detail with the patient and time was given for questions. The patient consents to medication trial.   -- Metabolic profile and EKG monitoring obtained while on an atypical antipsychotic  Body mass index is 30.85 kg/m. Qtc: 430, A1C: 4.8 Lipid Panel     Component Value Date/Time   CHOL 122 09/25/2022 0647   TRIG 27 09/25/2022 0647   HDL 42 09/25/2022 0647   CHOLHDL 2.9 09/25/2022 0647   VLDL 5 09/25/2022 0647   LDLCALC 75 09/25/2022 0647    -- Encouraged patient to participate in unit milieu and in scheduled group therapies   -- Short Term Goals: Ability to maintain clinical measurements within normal limits will improve  -- Long Term Goals: Improvement in symptoms so as ready for discharge    3. Medical Issues Being Addressed:   Tobacco Use Disorder  -- Nicotine patch /24 hours ordered  -- Smoking cessation encouraged  4. Discharge Planning:   -- Social work and case management to assist with discharge planning and identification of hospital follow-up needs prior to discharge  -- Estimated LOS: 5-7 days  -- Discharge Concerns: Need to establish a safety plan; Medication compliance and effectiveness  -- Discharge Goals: Return home with outpatient referrals for mental  health follow-up including medication management/psychotherapy   I certify that inpatient services furnished can reasonably be expected to improve the patient's condition.    Lorri Frederick, MD 12/8/20238:54 AM

## 2022-11-16 NOTE — BH IP Treatment Plan (Signed)
Interdisciplinary Treatment and Diagnostic Plan Update  11/16/2022 Time of Session: 9:45 AM  Mario Thomas MRN: [id number]  Principal Diagnosis: Schizoaffective disorder, bipolar type ([place])  Secondary Diagnoses: Principal Problem:   Schizoaffective disorder, bipolar type ([place])   Current Medications:  Current Facility-Administered Medications  Medication Dose Route Frequency Provider Last Rate Last Admin   acetaminophen (TYLENOL) tablet 650 mg  650 mg Oral Q6H PRN Adegbola, Bukola O, NP       alum & mag hydroxide-simeth (MAALOX/MYLANTA) 200-200-20 MG/5ML suspension 30 mL  30 mL Oral Q4H PRN Adegbola, Bukola O, NP       benztropine (COGENTIN) tablet 1 mg  1 mg Oral BID Hill, Jackie Plum, MD   1 mg at 11/16/22 3875   haloperidol (HALDOL) tablet 5 mg  5 mg Oral Q6H PRN Maida Sale, MD   5 mg at 11/16/22 0400   And   benztropine (COGENTIN) tablet 1 mg  1 mg Oral Q6H PRN Maida Sale, MD   1 mg at 11/16/22 0400   And   LORazepam (ATIVAN) tablet 2 mg  2 mg Oral Q6H PRN Maida Sale, MD   2 mg at 11/16/22 0400   haloperidol lactate (HALDOL) injection 5 mg  5 mg Intramuscular Q6H PRN Maida Sale, MD   5 mg at 11/15/22 1258   And   benztropine mesylate (COGENTIN) injection 1 mg  1 mg Intramuscular Q6H PRN Maida Sale, MD   1 mg at 11/15/22 1258   And   LORazepam (ATIVAN) injection 2 mg  2 mg Intravenous Q6H PRN Maida Sale, MD   2 mg at 11/15/22 1258   hydrOXYzine (ATARAX) tablet 25 mg  25 mg Oral TID PRN Earney Mallet, NP   25 mg at 11/16/22 0050   magnesium hydroxide (MILK OF MAGNESIA) suspension 30 mL  30 mL Oral Daily PRN Adegbola, Hassan Rowan, NP       propranolol (INDERAL) tablet 10 mg  10 mg Oral BID Maida Sale, MD   10 mg at 11/16/22 0811   risperiDONE (RISPERDAL M-TABS) disintegrating tablet 4 mg  4 mg Oral BID Maida Sale, MD   4 mg at 11/16/22 6433   traZODone (DESYREL) tablet 50 mg  50 mg Oral QHS Hill,  Jackie Plum, MD   50 mg at 11/16/22 0050   PTA Medications: Medications Prior to Admission  Medication Sig Dispense Refill Last Dose   benztropine (COGENTIN) 1 MG tablet Take 1 tablet (1 mg total) by mouth 2 (two) times daily. 60 tablet 0    hydrOXYzine (ATARAX) 25 MG tablet Take 1 tablet (25 mg total) by mouth 3 (three) times daily as needed for anxiety. 30 tablet 0    propranolol (INDERAL) 10 MG tablet Take 1 tablet (10 mg total) by mouth 2 (two) times daily. 60 tablet 0    risperiDONE (RISPERDAL M-TABS) 4 MG disintegrating tablet Take 1 tablet (4 mg total) by mouth 2 (two) times daily. 45 tablet 0    risperiDONE microspheres (RISPERDAL CONSTA) 25 MG injection Inject 2 mLs (25 mg total) into the muscle every 14 (fourteen) days. (Patient not taking: Reported on 11/14/2022) 1 each 0    traZODone (DESYREL) 50 MG tablet Take 1 tablet (50 mg total) by mouth at bedtime. 30 tablet 0     Patient Stressors: Medication change or noncompliance    Patient Strengths: Supportive family/friends   Treatment Modalities: Medication Management, Group therapy, Case management,  1 to 1  session with clinician, Psychoeducation, Recreational therapy.   Physician Treatment Plan for Primary Diagnosis: Schizoaffective disorder, bipolar type ([place]) Long Term Goal(s):     Short Term Goals:    Medication Management: Evaluate patient's response, side effects, and tolerance of medication regimen.  Therapeutic Interventions: 1 to 1 sessions, Unit Group sessions and Medication administration.  Evaluation of Outcomes: Not Met  Physician Treatment Plan for Secondary Diagnosis: Principal Problem:   Schizoaffective disorder, bipolar type ([place])  Long Term Goal(s):     Short Term Goals:       Medication Management: Evaluate patient's response, side effects, and tolerance of medication regimen.  Therapeutic Interventions: 1 to 1 sessions, Unit Group sessions and Medication administration.  Evaluation of  Outcomes: Not Met   RN Treatment Plan for Primary Diagnosis: Schizoaffective disorder, bipolar type ([place]) Long Term Goal(s): Knowledge of disease and therapeutic regimen to maintain health will improve  Short Term Goals: Ability to remain free from injury will improve, Ability to verbalize frustration and anger appropriately will improve, Ability to demonstrate self-control, Ability to participate in decision making will improve, Ability to verbalize feelings will improve, Ability to disclose and discuss suicidal ideas, Ability to identify and develop effective coping behaviors will improve, and Compliance with prescribed medications will improve  Medication Management: RN will administer medications as ordered by provider, will assess and evaluate patient's response and provide education to patient for prescribed medication. RN will report any adverse and/or side effects to prescribing provider.  Therapeutic Interventions: 1 on 1 counseling sessions, Psychoeducation, Medication administration, Evaluate responses to treatment, Monitor vital signs and CBGs as ordered, Perform/monitor CIWA, COWS, AIMS and Fall Risk screenings as ordered, Perform wound care treatments as ordered.  Evaluation of Outcomes: Not Met   LCSW Treatment Plan for Primary Diagnosis: Schizoaffective disorder, bipolar type ([place]) Long Term Goal(s): Safe transition to appropriate next level of care at discharge, Engage patient in therapeutic group addressing interpersonal concerns.  Short Term Goals: Engage patient in aftercare planning with referrals and resources, Increase social support, Increase ability to appropriately verbalize feelings, Increase emotional regulation, Facilitate acceptance of mental health diagnosis and concerns, Facilitate patient progression through stages of change regarding substance use diagnoses and concerns, Identify triggers associated with mental health/substance abuse issues, and Increase skills for  wellness and recovery  Therapeutic Interventions: Assess for all discharge needs, 1 to 1 time with Social worker, Explore available resources and support systems, Assess for adequacy in community support network, Educate family and significant other(s) on suicide prevention, Complete Psychosocial Assessment, Interpersonal group therapy.  Evaluation of Outcomes: Not Met   Progress in Treatment: Attending groups: Yes. Participating in groups: Yes. Taking medication as prescribed: Yes. Toleration medication: Yes. Family/Significant other contact made: No, will contact:  Meseret (mother) [phone] Patient understands diagnosis: No. Discussing patient identified problems/goals with staff: Yes. Medical problems stabilized or resolved: Yes. Denies suicidal/homicidal ideation: Yes. Issues/concerns per patient self-inventory: No.  New problem(s) identified: No, Describe:  None mentioned   New Short Term/Long Term Goal(s): medication stabilization, elimination of SI thoughts, development of comprehensive mental wellness plan.    Patient Goals:  "Be happy, communicate, be joyful, celebrate, and work on DC plans"  Discharge Plan or Barriers: Patient recently admitted. CSW will continue to follow and assess for appropriate referrals and possible discharge planning.    Reason for Continuation of Hospitalization: Aggression Delusions  Medication stabilization Suicidal ideation  Estimated Length of Stay: estimated 2-3 weeks   Last 3 [place] Suicide Severity Risk Score: [employer]  Admission (Current) from 11/15/2022 in [facility] ED from 11/14/2022 in [facility] ED from 10/09/2022 in [facility] No Risk No Risk High Risk       Last PHQ 2/9 Scores:    11/27/2019    9:41 AM 08/04/2018    9:03 AM  Depression screen PHQ 2/9  Decreased Interest 0 0  Down,  Depressed, Hopeless 0 0  PHQ - 2 Score 0 0    Scribe for Treatment Team: Charlett Lango 11/16/2022 11:51 AM

## 2022-11-16 NOTE — Progress Notes (Signed)
Patient did not attend morning orientation group.  

## 2022-11-16 NOTE — BHH Group Notes (Signed)
Adult Psychoeducational Group Note  Date:  11/16/2022 Time:  8:29 PM  Group Topic/Focus:  Wrap-Up Group:   The focus of this group is to help patients review their daily goal of treatment and discuss progress on daily workbooks.  Participation Level:  Active  Participation Quality:  Attentive  Affect:  Appropriate  Cognitive:  Appropriate  Insight: Appropriate  Engagement in Group:  Engaged  Modes of Intervention:  Discussion  Additional Comments:  Patient attended and participated in the Wrap-up group.  Jearl Klinefelter 11/16/2022, 8:29 PM

## 2022-11-16 NOTE — Progress Notes (Signed)
   11/16/22 0530  Sleep  Number of Hours 7

## 2022-11-16 NOTE — Progress Notes (Signed)
   11/16/22 1000  Psych Admission Type (Psych Patients Only)  Admission Status Involuntary  Psychosocial Assessment  Patient Complaints None  Eye Contact Fair  Facial Expression Anxious  Affect Preoccupied  Speech Tangential  Interaction Avoidant  Motor Activity Pacing  Appearance/Hygiene In scrubs  Behavior Characteristics Cooperative  Mood Preoccupied  Thought Process  Coherency Disorganized  Content Preoccupation  Delusions Religious  Perception Hallucinations  Hallucination Auditory  Judgment Poor  Confusion None  Danger to Self  Current suicidal ideation? Denies  Danger to Others  Danger to Others None reported or observed   Dar Note: Patient presents with anxious affect and paranoid behaviors.  Patient observed pacing the hallway, preoccupied, intrusive and responsive to internal stimuli.  Denies suicidal thoughts, auditory and visual hallucinations.  Routine safety checks maintained.  Patient is safe on and off the unit.

## 2022-11-17 NOTE — Plan of Care (Signed)
  Problem: Education: Goal: Knowledge of the prescribed therapeutic regimen will improve Outcome: Progressing   Problem: Coping: Goal: Coping ability will improve Outcome: Progressing   Problem: Health Behavior/Discharge Planning: Goal: Compliance with prescribed medication regimen will improve Outcome: Progressing

## 2022-11-17 NOTE — Group Note (Signed)
  BHH/BMU LCSW Group Therapy Note  Date/Time:  11/17/2022   Type of Therapy and Topic:  Group Therapy:  Feelings About Hospitalization  Participation Level:  Active   Description of Group This process group involved patients discussing their feelings related to being hospitalized, as well as the benefits they see to being in the hospital.  These feelings and benefits were itemized.  The group then brainstormed specific ways in which they could seek those same benefits when they discharge and return home.  Therapeutic Goals Patient will identify and describe positive and negative feelings related to hospitalization Patient will verbalize benefits of hospitalization to themselves personally Patients will brainstorm together ways they can obtain similar benefits in the outpatient setting, identify barriers to wellness and possible solutions  Summary of Patient Progress:  The patient expressed his primary feelings about being hospitalized are "I don't know - sometimes it makes me believe in God, and I think everything is inevitable."  His comments throughout group did not make sense.  Therapeutic Modalities Cognitive Behavioral Therapy Motivational Interviewing    Ambrose Mantle, LCSW 11/17/2022, 3:18 PM  .

## 2022-11-17 NOTE — Progress Notes (Signed)
Adult Psychoeducational Group Note  Date:  11/17/2022 Time:  12:47 PM  Group Topic/Focus:  Goals Group:   The focus of this group is to help patients establish daily goals to achieve during treatment and discuss how the patient can incorporate goal setting into their daily lives to aide in recovery.  Participation Level:  Active  Participation Quality:  Appropriate  Affect:  Appropriate  Cognitive:  Appropriate  Insight: Appropriate  Engagement in Group:  Engaged  Modes of Intervention:  Discussion  Additional Comments: The patient expressed that his gola was to take his medications.The patient also said to achieve his goal is taking his meds.Octavio Manns 11/17/2022, 12:47 PM

## 2022-11-17 NOTE — Progress Notes (Signed)
[facility] MD Progress Note  11/17/2022 4:05 PM Mario Thomas  MRN:  [id number] Subjective:   Mario Thomas is a 21 yo male with documented PPHx of Bipolar I disorder, schizophrenia, MDD, GAD, and previous IVCs who presents involuntarily under IVC for bizarre, erratic behavior in the setting of medication non-compliance.   On interview today, the patient exhibits severe religious preoccupation in addition to an appropriate affect.  He smiles frequently throughout the interview even when discussing negative topics.  He greets the interviewer by saying "I am holy and God's child".  He continues to return to religious content throughout the interview.  He reports going to groups and is unable to state what was done at the groups.  He denies experiencing suicidal or homicidal thoughts.  He denies experiencing auditory or visual hallucinations.  He denies experiencing any signs or symptoms of dystonia.  He denies experiencing any medication side effects.  Nursing report from the afternoon states that the patient was strongly responding to internal stimuli and rolling around on the floor and singing loudly.  Informed her about the plan for addition of a second antipsychotic if the patient remains psychotic tomorrow.  Principal Problem: Schizoaffective disorder, bipolar type ([facility]) Diagnosis: Principal Problem:   Schizoaffective disorder, bipolar type ([facility])  Total Time spent with patient: 20 minutes  Past Psychiatric History: as above  Past Medical History:  Past Medical History:  Diagnosis Date   Heart murmur    MDD (major depressive disorder), severe (H[facility]) 08/23/2018   History reviewed. No pertinent surgical history. Family History:  Family History  Problem Relation Age of Onset   Hyperlipidemia Father    Family Psychiatric  History: none Social History:  Social History   Substance and Sexual Activity  Alcohol Use Not Currently     Social History   Substance and Sexual Activity  Drug Use Never    Comment: Denies    Social History   Socioeconomic History   Marital status: Single    Spouse name: Not on file   Number of children: Not on file   Years of education: Not on file   Highest education level: Not on file  Occupational History   Not on file  Tobacco Use   Smoking status: Never   Smokeless tobacco: Never  Vaping Use   Vaping Use: Never used  Substance and Sexual Activity   Alcohol use: Not Currently   Drug use: Never    Comment: Denies   Sexual activity: Never  Other Topics Concern   Not on file  Social History Narrative   01/23/21   From: the area   Living: with parents   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, 1 sibling      Enjoys: running      Exercise: daily running    Diet: meat, veggies, grains      Safety   Seat belts: Yes    Guns: No   Safe in relationships: Yes    Social Determinants of Corporate investment banker Strain: Not on file  Food Insecurity: No Food Insecurity (11/15/2022)   Hunger Vital Sign    Worried About Running Out of Food in the Last Year: Never true    Ran Out of Food in the Last Year: Never true  Transportation Needs: No Transportation Needs (11/15/2022)   PRAPARE - Transportation    Lack of Transportation (Medical): No    Lack of  Transportation (Non-Medical): No  Physical Activity: Not on file  Stress: Not on file  Social Connections: Not on file   Additional Social History:                         Sleep: Fair  Appetite:  Fair  Current Medications: Current Facility-Administered Medications  Medication Dose Route Frequency Provider Last Rate Last Admin   acetaminophen (TYLENOL) tablet 650 mg  650 mg Oral Q6H PRN Adegbola, Bukola O, NP       alum & mag hydroxide-simeth (MAALOX/MYLANTA) 200-200-20 MG/5ML suspension 30 mL  30 mL Oral Q4H PRN Adegbola, Bukola O, NP       benztropine (COGENTIN) tablet 1 mg  1 mg Oral BID Hill, Shelbie Hutching,  MD   1 mg at 11/17/22 0751   haloperidol (HALDOL) tablet 5 mg  5 mg Oral Q6H PRN Roselle Locus, MD   5 mg at 11/17/22 1358   And   benztropine (COGENTIN) tablet 1 mg  1 mg Oral Q6H PRN Roselle Locus, MD   1 mg at 11/16/22 0400   And   LORazepam (ATIVAN) tablet 2 mg  2 mg Oral Q6H PRN Roselle Locus, MD   2 mg at 11/16/22 0400   haloperidol lactate (HALDOL) injection 5 mg  5 mg Intramuscular Q6H PRN Roselle Locus, MD   5 mg at 11/15/22 1258   And   benztropine mesylate (COGENTIN) injection 1 mg  1 mg Intramuscular Q6H PRN Roselle Locus, MD   1 mg at 11/15/22 1258   And   LORazepam (ATIVAN) injection 2 mg  2 mg Intravenous Q6H PRN Roselle Locus, MD   2 mg at 11/15/22 1258   hydrOXYzine (ATARAX) tablet 25 mg  25 mg Oral TID PRN Adin Hector, NP   25 mg at 11/17/22 0751   magnesium hydroxide (MILK OF MAGNESIA) suspension 30 mL  30 mL Oral Daily PRN Adegbola, Bukola O, NP       propranolol (INDERAL) tablet 10 mg  10 mg Oral BID Hill, Shelbie Hutching, MD   10 mg at 11/17/22 0751   risperiDONE (RISPERDAL M-TABS) disintegrating tablet 1 mg  1 mg Oral q AM Carrion-Carrero, Margely, MD   1 mg at 11/17/22 6195   risperiDONE (RISPERDAL M-TABS) disintegrating tablet 2 mg  2 mg Oral QHS Carrion-Carrero, Margely, MD   2 mg at 11/16/22 2027   risperiDONE (RISPERDAL M-TABS) disintegrating tablet 4 mg  4 mg Oral BID Roselle Locus, MD   4 mg at 11/17/22 0751   traZODone (DESYREL) tablet 50 mg  50 mg Oral QHS Hill, Shelbie Hutching, MD   50 mg at 11/16/22 2027    Lab Results: No results found for this or any previous visit (from the past 48 hour(s)).  Blood Alcohol level:  Lab Results  Component Value Date   [facility] <10 11/14/2022   ETH <10 10/09/2022    Metabolic Disorder Labs: Lab Results  Component Value Date   HGBA1C 4.8 09/25/2022   MPG 91.06 09/25/2022   MPG 93.93 05/28/2022   Lab Results  Component Value Date   PROLACTIN 10.0 08/24/2018    Lab Results  Component Value Date   CHOL 122 09/25/2022   TRIG 27 09/25/2022   HDL 42 09/25/2022   CHOLHDL 2.9 09/25/2022   VLDL 5 09/25/2022   LDLCALC 75 09/25/2022   LDLCALC 41 05/28/2022   Psychiatric Specialty Exam: Physical Exam Constitutional:  Appearance: the patient is not toxic-appearing.  Pulmonary:     Effort: Pulmonary effort is normal.  Neurological:     General: No focal deficit present.     Mental Status: the patient is alert and oriented to person, place, and time.   Review of Systems  Respiratory:  Negative for shortness of breath.   Cardiovascular:  Negative for chest pain.  Gastrointestinal:  Negative for abdominal pain, constipation, diarrhea, nausea and vomiting.  Neurological:  Negative for headaches.      BP 125/77   Pulse 84   Temp 97.8 F (36.6 C) (Oral)   Resp 20   Ht 5\' 10"  (1.778 m)   Wt 97.5 kg   SpO2 99%   BMI 30.85 kg/m   General Appearance: Fairly Groomed  Eye Contact:  Good  Speech:  Clear and Coherent  Volume:  Normal  Mood:  "good"  Affect:  bizarre  Thought Process:  illogical  Orientation:  Full (Time, Place, and Person)  Thought Content: delusional content  Suicidal Thoughts:  No  Homicidal Thoughts:  No  Memory:  Immediate;   Good  Judgement:  poor  Insight:  poor  Psychomotor Activity:  Normal  Concentration:  Concentration: Good  Recall:  Good  Fund of Knowledge: Good  Language: Good  Akathisia:  No  Handed:    AIMS (if indicated): not done  Assets:  Communication Skills Desire for Improvement Financial Resources/Insurance Housing Leisure Time Physical Health  ADL's:  Intact  Cognition: WNL  Sleep:  Fair    Treatment Plan Summary: Daily contact with patient to assess and evaluate symptoms and progress in treatment and Medication management ASSESSMENT: Mario Thomas is a 21 yo male with documented PPHx of Bipolar I disorder, schizophrenia, MDD, GAD, and previous IVCs who presents involuntarily under  IVC for bizarre, erratic behavior in the setting of medication non-compliance.  Patient appears to be acutely psychotic, with disorganized thoughts, he is illogical and delusional. There is an extensive history of medication noncompliance, per previous notes there have been plans to place patient on LAI and has refused injections.     Diagnoses / Active Problems: Schizoaffective Disorder, Bipolar Type   PLAN: Safety and Monitoring:             -- Involuntary admission to inpatient psychiatric unit for safety, stabilization and treatment             -- Daily contact with patient to assess and evaluate symptoms and progress in treatment             -- Patient's case to be discussed in multi-disciplinary team meeting             -- Observation Level : q15 minute checks             -- Vital signs:  q12 hours             -- Precautions: suicide, elopement, and assault   2. Psychiatric Diagnoses and Treatment:              Continue home risperidone 4 mg BID (since PM 12/7) Plan to add second antipsychotic 12/10 if unimproved -- The risks/benefits/side-effects/alternatives to this medication were discussed in detail with the patient and time was given for questions. The patient consents to medication trial.              -- Metabolic profile and EKG monitoring obtained while on an atypical antipsychotic  Body mass index is 30.85 kg/m. Qtc:  430, A1C: 4.8 Lipid Panel  Labs (Brief)          Component Value Date/Time    CHOL 122 09/25/2022 0647    TRIG 27 09/25/2022 0647    HDL 42 09/25/2022 0647    CHOLHDL 2.9 09/25/2022 0647    VLDL 5 09/25/2022 0647    LDLCALC 75 09/25/2022 0647                  -- Encouraged patient to participate in unit milieu and in scheduled group therapies              -- Short Term Goals: Ability to maintain clinical measurements within normal limits will improve             -- Long Term Goals: Improvement in symptoms so as ready for discharge                3.  Medical Issues Being Addressed:              Tobacco Use Disorder             -- Nicotine patch 21mg /24 hours ordered             -- Smoking cessation encouraged   4. Discharge Planning:              -- Social work and case management to assist with discharge planning and identification of hospital follow-up needs prior to discharge             -- Estimated LOS: 5-7 days             -- Discharge Concerns: Need to establish a safety plan; Medication compliance and effectiveness             -- Discharge Goals: Return home with outpatient referrals for mental health follow-up including medication management/psychotherapy    05-28-1985, MD 11/17/2022, 4:05 PM

## 2022-11-17 NOTE — Progress Notes (Signed)
[place] rates sleep as "Good". He denies SI/HI/AVH. Pt was observed spinning on the floor on his back; redirected multiple times for him to get off the floor. Pt was disorganized in speach with flight of ideas, hyperreligious. Pt frequently paces the hall observed talking to himself loudly. PRN vistaril requested and given. Pt remains safe.

## 2022-11-17 NOTE — B[facility] Group Notes (Signed)
Adult Psychoeducational Group Note  Date:  11/17/2022 Time:  8:25 PM  Group Topic/Focus:  Coping With Mental Health Crisis:   The purpose of this group is to help patients identify strategies for coping with mental health crisis.  Group discusses possible causes of crisis and ways to manage them effectively.  Participation Level:  Minimal  Participation Quality:  Redirectable  Affect:  Appropriate  Cognitive:  Alert  Insight: Lacking  Engagement in Group:  Limited  Modes of Intervention:  Discussion  Additional Comment  Jacalyn Lefevre 11/17/2022, 8:25 PM

## 2022-11-17 NOTE — Progress Notes (Signed)
Pt increasingly hyperactive/restless/disorganized on the unit. Pt observed engaging inappropriate conversations with other patients regarding religion, calling others "demons". Pt observed playing with the ceiling camera dome, and singing loudly. PRN haldol given.

## 2022-11-17 NOTE — Plan of Care (Signed)
  Problem: Education: Goal: Knowledge of the prescribed therapeutic regimen will improve Outcome: Progressing   Problem: Safety: Goal: Ability to remain free from injury will improve Outcome: Progressing   Problem: Self-Care: Goal: Ability to participate in self-care as condition permits will improve Outcome: Progressing   Problem: Self-Concept: Goal: Will verbalize positive feelings about self Outcome: Progressing   Problem: Education: Goal: Mental status will improve Outcome: Progressing   Problem: Activity: Goal: Sleeping patterns will improve Outcome: Progressing   Problem: Safety: Goal: Periods of time without injury will increase Outcome: Progressing   Problem: Coping: Goal: Ability to demonstrate self-control will improve Outcome: Progressing

## 2022-11-17 NOTE — Progress Notes (Signed)
     11/17/22 2040  Psych Admission Type (Psych Patients Only)  Admission Status Involuntary  Psychosocial Assessment  Patient Complaints Anxiety  Eye Contact Brief  Facial Expression Anxious  Affect Preoccupied  Speech Soft  Interaction Minimal;Forwards little  Motor Activity Slow;Pacing  Appearance/Hygiene Unremarkable  Behavior Characteristics Cooperative  Mood Anxious;Suspicious;Preoccupied  Thought Process  Coherency Disorganized;Circumstantial  Content Preoccupation  Delusions Religious  Perception Hallucinations  Hallucination None reported or observed  Judgment Poor  Confusion Moderate  Danger to Self  Current suicidal ideation? Denies  Agreement Not to Harm Self Yes  Description of Agreement verbal  Danger to Others  Danger to Others None reported or observed

## 2022-11-18 MED ORDER — RISPERIDONE 3 MG PO TBDP
3.0000 mg | ORAL_TABLET | Freq: Two times a day (BID) | ORAL | Status: AC
  Administered 2022-11-18: 3 mg via ORAL
  Filled 2022-11-18: qty 1

## 2022-11-18 MED ORDER — DIVALPROEX SODIUM 500 MG PO DR TAB
500.0000 mg | DELAYED_RELEASE_TABLET | Freq: Two times a day (BID) | ORAL | Status: DC
  Administered 2022-11-18 – 2022-11-19 (×3): 500 mg via ORAL
  Filled 2022-11-18 (×6): qty 1

## 2022-11-18 MED ORDER — RISPERIDONE 2 MG PO TBDP
2.0000 mg | ORAL_TABLET | Freq: Two times a day (BID) | ORAL | Status: AC
  Administered 2022-11-19: 2 mg via ORAL
  Filled 2022-11-18: qty 1

## 2022-11-18 MED ORDER — HALOPERIDOL 5 MG PO TABS
5.0000 mg | ORAL_TABLET | Freq: Two times a day (BID) | ORAL | Status: DC
  Administered 2022-11-18 – 2022-11-19 (×2): 5 mg via ORAL
  Filled 2022-11-18 (×6): qty 1

## 2022-11-18 MED ORDER — RISPERIDONE 1 MG PO TBDP
1.0000 mg | ORAL_TABLET | Freq: Two times a day (BID) | ORAL | Status: AC
  Administered 2022-11-19 – 2022-11-20 (×2): 1 mg via ORAL
  Filled 2022-11-18 (×3): qty 1

## 2022-11-18 NOTE — Group Note (Signed)
[facility] LCSW Group Therapy Note  Date/Time:    11/18/2022   Type of Therapy and Topic:  Group Therapy:  Shame and its Impact on My Life  Participation Level:  Did Not Attend   Description of Group:  The focus of this group was to examine our tendency to be hyper-critical of self and how this leads to feelings of worthlessness, hopelessness, and shame.  Patients were guided to the concept that shame is universal and is worsened by being kept hidden, but improved by being revealed.  We discussed how feeling unworthy is the result of shame and discussed the differences between guilt  ("I did something bad" "I made a mistake" "I did something stupid") and shame ("I am bad" "I am a mistake" "I am stupid") .  We discussed that feelings are not necessarily based in facts.  We also talked about what happens when we do something to numb our negative feelings and how that actually numbs our positive feelings at the same time.  Therapeutic Goals Identify statements patients automatically say to themselves, "I'll be worthy when...."  Examine how this is unkind to ourselves because it indicates we cannot be worthy until some far-reaching, possibly even unreachable, goal is achieved. Talk about the frequent use of unhealthy coping skills used when feeling unworthy Practice 4 coping skills briefly (5 senses mindfulness, Camera Zoom In and Out, Grounding, 5-finger breathing) Allow patients to discuss their shame out loud in order to reduce its power over them  Summary of Patient Progress: Patient was invited to group, did not attend.   Therapeutic Modalities Processing  Ambrose Mantle, LCSW 11/18/2022, 12:30 PM

## 2022-11-18 NOTE — Progress Notes (Signed)
     11/18/22 2008  Psych Admission Type (Psych Patients Only)  Admission Status Involuntary  Psychosocial Assessment  Patient Complaints Anxiety;Restlessness;Irritability  Eye Contact Brief  Facial Expression Animated;Anxious  Affect Preoccupied  Speech Pressured  Interaction Intrusive  Motor Activity Fidgety;Hyperactive;Pacing  Appearance/Hygiene Unremarkable  Behavior Characteristics Cooperative  Mood Preoccupied;Anxious  Thought Process  Coherency Disorganized;Flight of ideas;Tangential  Content Religiosity  Delusions Religious  Perception Hallucinations  Hallucination None reported or observed  Judgment Poor  Confusion Mild  Danger to Self  Current suicidal ideation? Denies  Agreement Not to Harm Self Yes  Description of Agreement verbal  Danger to Others  Danger to Others None reported or observed

## 2022-11-18 NOTE — [facility] Group Notes (Signed)
Pt attended wrap-up group. Pt was overall happy about the day.

## 2022-11-18 NOTE — Progress Notes (Signed)
  Administered PRN Hydroxyzine per MAR per pt request. 

## 2022-11-18 NOTE — Plan of Care (Signed)
  Problem: Health Behavior/Discharge Planning: Goal: Compliance with prescribed medication regimen will improve Outcome: Progressing   Problem: Nutritional: Goal: Ability to achieve adequate nutritional intake will improve Outcome: Progressing   Problem: Self-Care: Goal: Ability to participate in self-care as condition permits will improve Outcome: Progressing

## 2022-11-18 NOTE — Progress Notes (Signed)
Pt is asleep. Respirations even and unlabored.

## 2022-11-18 NOTE — Progress Notes (Addendum)
  Patient is in hallway pacing back and forth with Bible in hand, reading scripture aloud.  Patient approached peer abruptly, invading personal space, and began asking peer religious questions.  Patient was re-directed.  Writer asked patient if he would like something for anxiety before breakfast.  Patient agreed.  Charge Nurse administered PRN Hydroxyzine per MAR per pt request.

## 2022-11-18 NOTE — Progress Notes (Signed)
[facility]  11/18/2022 11:09 AM Mario Thomas  MRN:  [id number]  .probl   Reason for Admission:  Mario Thomas is a 21 yo male with documented PPHx of Bipolar I disorder, schizophrenia, MDD, GAD, and previous IVCs who presents involuntarily under IVC for bizarre, erratic behavior in the setting of medication non-compliance   Chart Review from last 24 hours:  The patient's chart was reviewed and nursing notes were reviewed. The patient's case was discussed in multidisciplinary team meeting.  Patient has continued to make sexually inappropriate remarks to male staff on the unit.  Continues to behave erratically and exhibiting bizarre, hyperreligious delusions. - Overnight events to report per chart review: No major overnight events.  Patient had complaints of anxiety, was given Atarax - Patient received all scheduled medications - Patient received the following PRN medications: Atarax (2X) Haldol (1X)  Subjective:   Patient evaluated on the unit, had difficulty remaining seated.  When asked how he feels today he replies "what?".  Continue to attempt to interview patient, states "I do not understand what you mean, why do I feel this way".  He denies any auditory or visual hallucinations, but appears internally preoccupied.  He denies experiencing any suicidal or homicidal thoughts.  There are no apparent paranoid ideations.  Interview is concluded prematurely, as patient stands up and begins to run labs in the hallway.  He denies any side effects to currently prescribed psychiatric medications.  He denies any somatic symptoms  Principal Problem: Schizoaffective disorder, bipolar type (HCC) Diagnosis: Principal Problem:   Schizoaffective disorder, bipolar type (HCC)  Total Time spent with patient: 20 minutes  Past Psychiatric History: as above  Past Medical History:  Past Medical History:  Diagnosis Date   Heart murmur    MDD (major depressive disorder), severe (HCC) 08/23/2018    History reviewed. No pertinent surgical history. Family History:  Family History  Problem Relation Age of Onset   Hyperlipidemia Father    Family Psychiatric  History: none Social History:  Social History   Substance and Sexual Activity  Alcohol Use Not Currently     Social History   Substance and Sexual Activity  Drug Use Never   Comment: Denies    Social History   Socioeconomic History   Marital status: Single    Spouse name: Not on file   Number of children: Not on file   Years of education: Not on file   Highest education level: Not on file  Occupational History   Not on file  Tobacco Use   Smoking status: Never   Smokeless tobacco: Never  Vaping Use   Vaping Use: Never used  Substance and Sexual Activity   Alcohol use: Not Currently   Drug use: Never    Comment: Denies   Sexual activity: Never  Other Topics Concern   Not on file  Social History Narrative   01/23/21   From: the area   Living: with parents   Work: Counselling psychologist[employer]   School: Actuarylectrical engineering at [employer]      Family: good relationship with parents, 1 sibling      Enjoys: running      Exercise: daily running    Diet: meat, veggies, grains      Safety   Seat belts: Yes    Guns: No   Safe in relationships: Yes    Social Determinants of Corporate investment bankerHealth   Financial Resource Strain: Not on file  Food Insecurity: No Food Insecurity (11/15/2022)   Hunger  Vital Sign    Worried About Programme researcher, broadcasting/film/video in the Last Year: Never true    Ran Out of Food in the Last Year: Never true  Transportation Needs: No Transportation Needs (11/15/2022)   PRAPARE - Administrator, Civil Service (Medical): No    Lack of Transportation (Non-Medical): No  Physical Activity: Not on file  Stress: Not on file  Social Connections: Not on file   Additional Social History:   Sleep: Fair  Appetite:  Fair  Current Medications: Current Facility-Administered Medications  Medication Dose Route Frequency Provider  Last Rate Last Admin   acetaminophen (TYLENOL) tablet 650 mg  650 mg Oral Q6H PRN Adegbola, Bukola O, NP       alum & mag hydroxide-simeth (MAALOX/MYLANTA) 200-200-20 MG/5ML suspension 30 mL  30 mL Oral Q4H PRN Adegbola, Bukola O, NP       benztropine (COGENTIN) tablet 1 mg  1 mg Oral BID Hill, Shelbie Hutching, MD   1 mg at 11/18/22 0733   haloperidol (HALDOL) tablet 5 mg  5 mg Oral Q6H PRN Roselle Locus, MD   5 mg at 11/18/22 0810   And   benztropine (COGENTIN) tablet 1 mg  1 mg Oral Q6H PRN Roselle Locus, MD   1 mg at 11/18/22 1610   And   LORazepam (ATIVAN) tablet 2 mg  2 mg Oral Q6H PRN Roselle Locus, MD   2 mg at 11/18/22 0810   haloperidol lactate (HALDOL) injection 5 mg  5 mg Intramuscular Q6H PRN Roselle Locus, MD   5 mg at 11/15/22 1258   And   benztropine mesylate (COGENTIN) injection 1 mg  1 mg Intramuscular Q6H PRN Roselle Locus, MD   1 mg at 11/15/22 1258   And   LORazepam (ATIVAN) injection 2 mg  2 mg Intravenous Q6H PRN Roselle Locus, MD   2 mg at 11/15/22 1258   divalproex (DEPAKOTE) DR tablet 500 mg  500 mg Oral BID Roselle Locus, MD   500 mg at 11/18/22 0815   haloperidol (HALDOL) tablet 5 mg  5 mg Oral BID Roselle Locus, MD       hydrOXYzine (ATARAX) tablet 25 mg  25 mg Oral TID PRN Adin Hector, NP   25 mg at 11/18/22 9604   magnesium hydroxide (MILK OF MAGNESIA) suspension 30 mL  30 mL Oral Daily PRN Adegbola, Denny Levy, NP       propranolol (INDERAL) tablet 10 mg  10 mg Oral BID Roselle Locus, MD   10 mg at 11/18/22 5409   risperiDONE (RISPERDAL M-TABS) disintegrating tablet 3 mg  3 mg Oral BID Roselle Locus, MD       Followed by   Melene Muller ON 11/19/2022] risperiDONE (RISPERDAL M-TABS) disintegrating tablet 2 mg  2 mg Oral BID Hill, Shelbie Hutching, MD       Followed by   Melene Muller ON 11/19/2022] risperiDONE (RISPERDAL M-TABS) disintegrating tablet 1 mg  1 mg Oral BID Hill, Shelbie Hutching, MD        traZODone (DESYREL) tablet 50 mg  50 mg Oral QHS Hill, Shelbie Hutching, MD   50 mg at 11/17/22 2042    Lab Results: No results found for this or any previous visit (from the past 48 hour(s)).  Blood Alcohol level:  Lab Results  Component Value Date   [facility] <10 11/14/2022   ETH <10 10/09/2022    Metabolic Disorder Labs: Lab Results  Component Value Date   HGBA1C 4.8 09/25/2022   MPG 91.06 09/25/2022   MPG 93.93 05/28/2022   Lab Results  Component Value Date   PROLACTIN 10.0 08/24/2018   Lab Results  Component Value Date   CHOL 122 09/25/2022   TRIG 27 09/25/2022   HDL 42 09/25/2022   CHOLHDL 2.9 09/25/2022   VLDL 5 09/25/2022   LDLCALC 75 09/25/2022   LDLCALC 41 05/28/2022   Psychiatric Specialty Exam:Psychiatric Specialty Exam:  Presentation  General Appearance: Bizarre, patient wearing short wrapped around his head; disheveled Eye Contact: Good Speech: Intermittently incoherent Speech Volume: Normal Handedness: N/AA  Mood and Affect  Mood: "Doing great" Affect: Bizarre, inappropriately smiling and laughing  Thought Process  Thought Processes: Disorganized; irrelevant; incoherent Descriptions of Associations: Loose Orientation: Partial Thought Content: Hyperreligious delusions are present; illogical as evidenced by patient's inability to answer questions appropriately; scattered History of Schizophrenia/Schizoaffective disorder: Yes Duration of Psychotic Symptoms: Less than 6 months Hallucinations: Patient denies any hallucinations, appears internally preoccupied Ideas of Reference: Delusions Suicidal Thoughts: Denies any Homicidal Thoughts: Denies any  Sensorium  Memory: Poor Judgment: Poor Insight: None  Executive Functions  Concentration: Poor Attention Span: Poor Recall: Poor Fund of Knowledge: Poor Language: Poor  Psychomotor Activity  Psychomotor Activity: Increased, hyperactive, erratic; gait appears grossly normal  Assets  Assets:  Physical health  Sleep  Sleep: Good, 7 hours   Physical Exam: Physical Exam Constitutional:      General: He is not in acute distress. Eyes:     Extraocular Movements: Extraocular movements intact.     Conjunctiva/sclera: Conjunctivae normal.  Pulmonary:     Effort: Pulmonary effort is normal. No respiratory distress.  Musculoskeletal:        General: Normal range of motion.  Skin:    General: Skin is warm and dry.  Neurological:     General: No focal deficit present.     Mental Status: He is alert.     Cranial Nerves: No cranial nerve deficit.  Psychiatric:     Comments: See PSE    Review of Systems  Cardiovascular:  Negative for chest pain.  Gastrointestinal:  Negative for abdominal pain.  Musculoskeletal:  Negative for myalgias.  Neurological:  Negative for dizziness and headaches.   Blood pressure (!) 141/68, pulse (!) 117, temperature 98.2 F (36.8 C), temperature source Oral, resp. rate 20, height 5\' 10"  (1.778 m), weight 97.5 kg, SpO2 99 %. Body mass index is 30.85 kg/m.    Treatment Plan Summary: Daily contact with patient to assess and evaluate symptoms and progress in treatment and Medication management ASSESSMENT: Damire Foell is a 21 yo male with documented PPHx of Bipolar I disorder, schizophrenia, MDD, GAD, and previous IVCs who presents involuntarily under IVC for bizarre, erratic behavior in the setting of medication non-compliance.  Patient appears to be acutely psychotic, with disorganized thoughts, he is illogical and delusional. There is an extensive history of medication noncompliance, per previous notes there have been plans to place patient on LAI and has refused injections.  Patient's status has not improved, has not responded to Risperdal oral 4 mg twice daily.  Continues to make inappropriate sexual remarks towards staff, overall exhibiting erratic and bizarre behavior such as running through the halls.  He has received Haldol as needed for agitation  since admission , no EPS noted as there was concern that there was a prior history of dystonia associated with Haldol.  Risperdal will be discontinued and cross tapered with Haldol and Depakote  as shown below.  Will continue to monitor for improvements in mentation.     Diagnoses / Active Problems: Schizoaffective Disorder, Bipolar Type   PLAN: Safety and Monitoring:             -- Involuntary admission to inpatient psychiatric unit for safety, stabilization and treatment             -- Daily contact with patient to assess and evaluate symptoms and progress in treatment             -- Patient's case to be discussed in multi-disciplinary team meeting             -- Observation Level : q15 minute checks             -- Vital signs:  q12 hours             -- Precautions: suicide, elopement, and assault   2. Psychiatric Diagnoses and Treatment:  Plan to cross taper as planned below as patient has not responded to Risperdal 4 mg BID: Sunday 12/10 Risperdal 3 mg BID ; Haldol 5 mg daily + Depakote 500 mg  as tolerated Monday 12/11 Risperdal 2 mg BID ; Haldol 5 mg daily + Depakote 500 mg  as tolerated Tuesday 12/12 Risperdal 1 mg BID ; Haldol 5 mg daily + Depakote 500 mg  as tolerated -- The risks/benefits/side-effects/alternatives to this medication were discussed in detail with the patient and time was given for questions. The patient consents to medication trial.              -- Metabolic profile and EKG monitoring obtained while on an atypical antipsychotic  Body mass index is 30.85 kg/m.  Qtc: 430 A1C: 4.8 Lipid Panel  Lipid Panel     Component Value Date/Time   CHOL 122 09/25/2022 0647   TRIG 27 09/25/2022 0647   HDL 42 09/25/2022 0647   CHOLHDL 2.9 09/25/2022 0647   VLDL 5 09/25/2022 0647   LDLCALC 75 09/25/2022 0647                -- Encouraged patient to participate in unit milieu and in scheduled group therapies              -- Short Term Goals: Ability to maintain clinical  measurements within normal limits will improve             -- Long Term Goals: Improvement in symptoms so as ready for discharge                3. Medical Issues Being Addressed:              Tobacco Use Disorder             -- Nicotine patch 21mg /24 hours ordered             -- Smoking cessation encouraged   4. Discharge Planning:              -- Social work and case management to assist with discharge planning and identification of hospital follow-up needs prior to discharge             -- Estimated LOS: 5-7 days             -- Discharge Concerns: Need to establish a safety plan; Medication compliance and effectiveness             -- Discharge Goals: Return home with outpatient referrals for mental health follow-up  including medication management/psychotherapy    Lorri Frederick, MD 11/18/2022, 11:09 AM

## 2022-11-18 NOTE — Progress Notes (Signed)
    11/18/22 0500  Sleep  Number of Hours 7    

## 2022-11-18 NOTE — Progress Notes (Signed)
Randy rates sleep as "Good". He denies SI/HI/AVH. Pt observed pacing the hallway nonstop. Pt is intrusive on the unit, interrupting others conversations in which he needs frequent redirection. Pt remains disorganized and hyperreligious. PRN haldol offered and given. Pt remains safe.

## 2022-11-19 MED ORDER — DIVALPROEX SODIUM 500 MG PO DR TAB
750.0000 mg | DELAYED_RELEASE_TABLET | Freq: Two times a day (BID) | ORAL | Status: DC
  Administered 2022-11-19 – 2022-11-22 (×6): 750 mg via ORAL
  Filled 2022-11-19 (×11): qty 1

## 2022-11-19 MED ORDER — DIVALPROEX SODIUM 250 MG PO DR TAB
750.0000 mg | DELAYED_RELEASE_TABLET | Freq: Two times a day (BID) | ORAL | Status: DC
  Filled 2022-11-19 (×2): qty 3

## 2022-11-19 MED ORDER — HALOPERIDOL 5 MG PO TABS
5.0000 mg | ORAL_TABLET | Freq: Two times a day (BID) | ORAL | Status: DC
  Administered 2022-11-19 – 2022-11-22 (×6): 5 mg via ORAL
  Filled 2022-11-19 (×11): qty 1

## 2022-11-19 MED ORDER — PROPRANOLOL HCL 10 MG PO TABS
10.0000 mg | ORAL_TABLET | Freq: Two times a day (BID) | ORAL | Status: DC
  Administered 2022-11-19 – 2022-11-22 (×6): 10 mg via ORAL
  Filled 2022-11-19 (×11): qty 1

## 2022-11-19 MED ORDER — LORAZEPAM 1 MG PO TABS
1.0000 mg | ORAL_TABLET | Freq: Two times a day (BID) | ORAL | Status: DC
  Administered 2022-11-19 – 2022-11-22 (×6): 1 mg via ORAL
  Filled 2022-11-19 (×6): qty 1

## 2022-11-19 NOTE — Group Note (Signed)
Recreation Therapy Group Note   Group Topic:Goal Setting  Group Date: 11/19/2022 Start Time: 1000 End Time: 1025 Facilitators: Brynne Doane-McCall, LRT,CTRS Location: 500 Hall Dayroom   Goal Area(s) Addresses:  Patient will identify the importance of setting life goals. Patient will identify how to improve in specific areas. Patient will express the importance of setting goals post d/c.  Group Description: Setting Life Goals.  Patients were given a worksheet that focused on patients setting goals in 6 different categories (family, friends, work/school, spirituality, body and mental health).  Patients were to identify what needed to be improved and set a goal of how to accomplish that goal.  LRT encouraged patients to be as specific as possible when coming up with their goals.  Patients would then share their top 4 categories they want to work on and share with group.     Affect/Mood: Appropriate   Participation Level: Engaged   Participation Quality: Independent   Behavior: Appropriate   Speech/Thought Process: Focused   Insight: Moderate   Judgement: Moderate   Modes of Intervention: Worksheet   Patient Response to Interventions:  Engaged   Education Outcome:  Acknowledges education and In group clarification offered    Clinical Observations/Individualized Feedback: Pt was engaged in activity.  Pt identified top four categories as family, friends, spirituality and work/school.  Pt expressed wanting to improve being good, being nicer to sister and be giving.  Pt made goal of always having love for family and friends.  For friends, pt wanted to improve loving/caring for them and not being jealous.  Pt set goal to hang out with his friends.  Pt expressed in spirituality, he wants to improve serving God, hating the devil and loving God.  Pt has goal of "learn about the one true God".  Lastly, pt wants to work on work/school by not being lazy, being hard working and being  respectful to teachers.  Pt has goal of asking questions and working hard.      Plan: Continue to engage patient in RT group sessions 2-3x/week.   Haroldine Redler-McCall, LRT,CTRS  11/19/2022 12:39 PM

## 2022-11-19 NOTE — Progress Notes (Signed)
  Administered PRN Hydroxyzine per MAR per pt request. 

## 2022-11-19 NOTE — Progress Notes (Signed)
    11/19/22 0500  Sleep  Number of Hours 8.25

## 2022-11-19 NOTE — Progress Notes (Signed)
Adult Psychoeducational Group Note  Date:  11/19/2022 Time:  9:32 PM  Group Topic/Focus:  Wrap-Up Group:   The focus of this group is to help patients review their daily goal of treatment and discuss progress on daily workbooks.  Participation Level:  Active  Participation Quality:  Appropriate  Affect:  Appropriate  Cognitive:  Appropriate  Insight: Appropriate  Engagement in Group:  Engaged  Modes of Intervention:  Discussion  Additional Comments:   Pt states that he had a good day. Pt had a visit with his mom earlier that he said he enjoyed. Pt denied everything.   Vevelyn Pat 11/19/2022, 9:32 PM

## 2022-11-19 NOTE — Progress Notes (Signed)
   11/19/22 2015  Psych Admission Type (Psych Patients Only)  Admission Status Involuntary  Psychosocial Assessment  Patient Complaints Anxiety  Eye Contact Fair  Facial Expression Animated;Anxious  Affect Preoccupied  Speech Pressured  Interaction Intrusive  Motor Activity Pacing  Appearance/Hygiene Disheveled  Behavior Characteristics Cooperative  Mood Preoccupied  Aggressive Behavior  Effect No apparent injury  Thought Process  Coherency Disorganized;Circumstantial  Content Religiosity  Delusions Religious  Perception Hallucinations  Hallucination Auditory  Judgment Poor  Confusion None  Danger to Self  Current suicidal ideation? Denies  Danger to Others  Danger to Others None reported or observed

## 2022-11-19 NOTE — Progress Notes (Addendum)
Pt visible in milieu, paces in halls, restless / fidgety on interactions. Unable to sit still during scheduled groups. Denies SI, HI, AVH and pain when assessed. However, pt observed to be intrusive, disorganized, hypr-religious, hyperactive and hyper-verbal with tangential speech. Pointed to his picture on computer "that's how the devil looks like" reading the Bible loudly multiple times throughout this shift. PRN Vistaril given at [phone] for anxiety with minimal effect.  Safety maintained in milieu at Q 15 minutes intervals without incident. Emotional support, encouragement and reassurance offered to pt. Verbal education done on current treatment regimen and effects monitored. Pt went off unit for meals, returned without issues.Attended scheduled groups, engaged in discussions with multiple prompts. Tolerates meals, fluids and medications well. Safety maintained on and off unit. Pt continues to need frequent verbal redirections to comply with unit routines.

## 2022-11-19 NOTE — Progress Notes (Signed)
[facility] MD Progress Note  11/19/2022 7:20 AM Mario Thomas  MRN:  [id number]   Reason for Admission:  Mario Thomas is a 21 yo male with documented PPHx of Bipolar I disorder, schizophrenia, MDD, GAD, and previous IVCs who presents involuntarily under IVC for bizarre, erratic behavior in the setting of medication non-compliance   Chart Review from last 24 hours:  The patient's chart was reviewed and nursing notes were reviewed. The patient's case was discussed in multidisciplinary team meeting.  Continues to behave erratically and exhibiting bizarre, hyperreligious delusions. - Overnight events to report per chart review: No major overnight events. - Patient received all scheduled medications - Patient received the following PRN medications: Atarax (1x), Agitation (haldol 5 mg +ativan 2 mg)  Subjective:   Patient evaluated on the unit, was observed pacing back and forth on the unit. Asked why he is feeling restless, replies " I am not restless". When assessing for mood, replies " I feel excellent, god has saved me. I am doing great". Appears internally preoccupied and distractible, denies any auditory or visual hallucinations. Unable to assess for any active delusions as patient had difficulty remaining seated, concentration and attention were poor.   On assessment today, patient denies any homicidal ideation. Denied any thought withdrawal, thought broadcasting, or ideas of reference. Denies any paranoid ideation.   Patient denies any side effects to currently prescribed psychiatric medications. Denies any somatic symptoms.   Principal Problem: Schizoaffective disorder, bipolar type (HCC) Diagnosis: Principal Problem:   Schizoaffective disorder, bipolar type (HCC)  Total Time spent with patient: 20 minutes  Past Psychiatric History: as above  Past Medical History:  Past Medical History:  Diagnosis Date   Heart murmur    MDD (major depressive disorder), severe (HCC) 08/23/2018   History  reviewed. No pertinent surgical history. Family History:  Family History  Problem Relation Age of Onset   Hyperlipidemia Father    Family Psychiatric  History: none Social History:  Social History   Substance and Sexual Activity  Alcohol Use Not Currently     Social History   Substance and Sexual Activity  Drug Use Never   Comment: Denies    Social History   Socioeconomic History   Marital status: Single    Spouse name: Not on file   Number of children: Not on file   Years of education: Not on file   Highest education level: Not on file  Occupational History   Not on file  Tobacco Use   Smoking status: Never   Smokeless tobacco: Never  Vaping Use   Vaping Use: Never used  Substance and Sexual Activity   Alcohol use: Not Currently   Drug use: Never    Comment: Denies   Sexual activity: Never  Other Topics Concern   Not on file  Social History Narrative   01/23/21   From: the area   Living: with parents   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, 1 sibling      Enjoys: running      Exercise: daily running    Diet: meat, veggies, grains      Safety   Seat belts: Yes    Guns: No   Safe in relationships: Yes    Social Determinants of Corporate investment banker Strain: Not on file  Food Insecurity: No Food Insecurity (11/15/2022)   Hunger Vital Sign    Worried About Running Out of  Food in the Last Year: Never true    Ran Out of Food in the Last Year: Never true  Transportation Needs: No Transportation Needs (11/15/2022)   PRAPARE - Administrator, Civil Service (Medical): No    Lack of Transportation (Non-Medical): No  Physical Activity: Not on file  Stress: Not on file  Social Connections: Not on file   Additional Social History:   Sleep: Fair  Appetite:  Fair  Current Medications: Current Facility-Administered Medications  Medication Dose Route Frequency Provider Last  Rate Last Admin   acetaminophen (TYLENOL) tablet 650 mg  650 mg Oral Q6H PRN Adegbola, Bukola O, NP       alum & mag hydroxide-simeth (MAALOX/MYLANTA) 200-200-20 MG/5ML suspension 30 mL  30 mL Oral Q4H PRN Adegbola, Bukola O, NP       benztropine (COGENTIN) tablet 1 mg  1 mg Oral BID Hill, Shelbie Hutching, MD   1 mg at 11/18/22 1608   haloperidol (HALDOL) tablet 5 mg  5 mg Oral Q6H PRN Roselle Locus, MD   5 mg at 11/18/22 0810   And   benztropine (COGENTIN) tablet 1 mg  1 mg Oral Q6H PRN Roselle Locus, MD   1 mg at 11/18/22 8110   And   LORazepam (ATIVAN) tablet 2 mg  2 mg Oral Q6H PRN Roselle Locus, MD   2 mg at 11/18/22 0810   haloperidol lactate (HALDOL) injection 5 mg  5 mg Intramuscular Q6H PRN Roselle Locus, MD   5 mg at 11/15/22 1258   And   benztropine mesylate (COGENTIN) injection 1 mg  1 mg Intramuscular Q6H PRN Roselle Locus, MD   1 mg at 11/15/22 1258   And   LORazepam (ATIVAN) injection 2 mg  2 mg Intravenous Q6H PRN Roselle Locus, MD   2 mg at 11/15/22 1258   divalproex (DEPAKOTE) DR tablet 500 mg  500 mg Oral BID Roselle Locus, MD   500 mg at 11/18/22 1608   haloperidol (HALDOL) tablet 5 mg  5 mg Oral BID Roselle Locus, MD   5 mg at 11/18/22 1611   hydrOXYzine (ATARAX) tablet 25 mg  25 mg Oral TID PRN Adin Hector, NP   25 mg at 11/18/22 2008   magnesium hydroxide (MILK OF MAGNESIA) suspension 30 mL  30 mL Oral Daily PRN Adegbola, Denny Levy, NP       propranolol (INDERAL) tablet 10 mg  10 mg Oral BID Roselle Locus, MD   10 mg at 11/18/22 1608   risperiDONE (RISPERDAL M-TABS) disintegrating tablet 3 mg  3 mg Oral BID Roselle Locus, MD   3 mg at 11/18/22 1608   Followed by   risperiDONE (RISPERDAL M-TABS) disintegrating tablet 2 mg  2 mg Oral BID Hill, Shelbie Hutching, MD       Followed by   risperiDONE (RISPERDAL M-TABS) disintegrating tablet 1 mg  1 mg Oral BID Hill, Shelbie Hutching, MD        traZODone (DESYREL) tablet 50 mg  50 mg Oral QHS Hill, Shelbie Hutching, MD   50 mg at 11/18/22 2008    Lab Results: No results found for this or any previous visit (from the past 48 hour(s)).  Blood Alcohol level:  Lab Results  Component Value Date   [facility] <10 11/14/2022   ETH <10 10/09/2022    Metabolic Disorder Labs: Lab Results  Component Value Date   HGBA1C 4.8 09/25/2022  MPG 91.06 09/25/2022   MPG 93.93 05/28/2022   Lab Results  Component Value Date   PROLACTIN 10.0 08/24/2018   Lab Results  Component Value Date   CHOL 122 09/25/2022   TRIG 27 09/25/2022   HDL 42 09/25/2022   CHOLHDL 2.9 09/25/2022   VLDL 5 09/25/2022   LDLCALC 75 09/25/2022   LDLCALC 41 05/28/2022    Psychiatric Specialty Exam:  Presentation  General Appearance: Bizarre; disheveled Eye Contact: Fleeting Speech: Normal rate; clear coherent Speech Volume: Normal Handedness: Right  Mood and Affect  Mood: "I feel excellent" Affect: Incongruent; inappropriate  Thought Process  Thought Processes: Disorganized; irrelevant Descriptions of Associations: Tangential Orientation: Full (time, place and person) Thought Content: Illogical; scattered History of Schizophrenia/Schizoaffective disorder: Yes Duration of Psychotic Symptoms: Less than 6 months Hallucinations: Patient denies any hallucinations, appears internally preoccupied Ideas of Reference: Delusions Suicidal Thoughts: Denies Homicidal Thoughts: Denies  Sensorium  Memory: Poor Judgment: Poor Insight: None  Executive Functions  Concentration: Poor Attention Span: Poor Recall: [employer] of Knowledge: Poor Language: Fair  Psychomotor Activity  Psychomotor Activity: Hyperactive; restlessness; concern for EPS-akathisia  Assets  Assets: Physical health  Sleep  Sleep: Good, 8.25 hours   Physical Exam: Physical Exam Constitutional:      General: He is not in acute distress.    Appearance: Normal appearance. He is not  ill-appearing.  Pulmonary:     Effort: Pulmonary effort is normal. No respiratory distress.  Musculoskeletal:        General: Normal range of motion.  Skin:    General: Skin is warm and dry.  Neurological:     Mental Status: He is alert.    Review of Systems  Respiratory:  Negative for shortness of breath.   Cardiovascular:  Negative for chest pain.  Gastrointestinal:  Negative for abdominal pain, constipation and diarrhea.  Neurological:  Negative for dizziness.  Psychiatric/Behavioral:  Negative for depression, hallucinations, memory loss, substance abuse and suicidal ideas. The patient is not nervous/anxious and does not have insomnia.    Blood pressure (!) 136/90, pulse (!) 109, temperature 97.8 F (36.6 C), temperature source Oral, resp. rate 20, height 5\' 10"  (1.778 m), weight 97.5 kg, SpO2 100 %. Body mass index is 30.85 kg/m.    Treatment Plan Summary: Daily contact with patient to assess and evaluate symptoms and progress in treatment and Medication management ASSESSMENT: Mario Thomas is a 21 yo male with documented PPHx of Bipolar I disorder, schizophrenia, MDD, GAD, and previous IVCs who presents involuntarily under IVC for bizarre, erratic behavior in the setting of medication non-compliance.  Patient appears to be acutely psychotic, with disorganized thoughts, he is illogical and delusional. There is an extensive history of medication noncompliance, per previous notes there have been plans to place patient on LAI and has refused injections.  Patient continues to exhibit bizarre, erratic behavior.  He is tangential and disorganized, was very minimal today.  He denied any hallucinations, but appears to be responding to internal stimuli.  He continues to pace up and down the unit, concern for akathisia given previous doses of Risperdal.  Will continue to assess for improvements with this current cross taper, risperidone will continue to be decreased and we will continue Haldol plus  Depakote.  Today we will plan to increase Depakote to 750 mg and add Ativan 1 mg every 12 hours for continued agitation.     Diagnoses / Active Problems: Schizoaffective Disorder, Bipolar Type   PLAN: Safety and Monitoring:             --  Involuntary admission to inpatient psychiatric unit for safety, stabilization and treatment             -- Daily contact with patient to assess and evaluate symptoms and progress in treatment             -- Patient's case to be discussed in multi-disciplinary team meeting             -- Observation Level : q15 minute checks             -- Vital signs:  q12 hours             -- Precautions: suicide, elopement, and assault   2. Psychiatric Diagnoses and Treatment:  Cross titration as planned below ,as patient has not responded to Risperdal 4 mg BID: Sunday 12/10 Risperdal 3 mg BID ; Haldol 5 mg daily + Depakote 500 mg  as tolerated Monday 12/11 Risperdal 2 mg BID ; Haldol 5 mg daily + Depakote increased to 750 mg  Start scheduled Ativan 1 mg Q12 Hrs for hyperactivity Continue Cogentin 1 mg twice daily for EPS prophylaxis Tuesday 12/12 Risperdal 1 mg BID ; Haldol 5 mg daily + Depakote 750 mg  as tolerated -- The risks/benefits/side-effects/alternatives to this medication were discussed in detail with the patient and time was given for questions. The patient consents to medication trial.              -- Metabolic profile and EKG monitoring obtained while on an atypical antipsychotic  Body mass index is 30.85 kg/m.  Qtc: 430 A1C: 4.8 Lipid Panel     Component Value Date/Time   CHOL 122 09/25/2022 0647   TRIG 27 09/25/2022 0647   HDL 42 09/25/2022 0647   CHOLHDL 2.9 09/25/2022 0647   VLDL 5 09/25/2022 0647   LDLCALC 75 09/25/2022 0647                -- Encouraged patient to participate in unit milieu and in scheduled group therapies              -- Short Term Goals: Ability to maintain clinical measurements within normal limits will improve              -- Long Term Goals: Improvement in symptoms so as ready for discharge                3. Medical Issues Being Addressed:              Tobacco Use Disorder             -- Nicotine patch 21mg /24 hours ordered             -- Smoking cessation encouraged   4. Discharge Planning:              -- Social work and case management to assist with discharge planning and identification of hospital follow-up needs prior to discharge             -- Estimated LOS: 5-7 days             -- Discharge Concerns: Need to establish a safety plan; Medication compliance and effectiveness             -- Discharge Goals: Return home with outpatient referrals for mental health follow-up including medication management/psychotherapy    Lorri FrederickMargely Carrion-Carrero, MD 11/19/2022, 7:20 AM

## 2022-11-19 NOTE — [facility] Group Notes (Signed)
[facility] Group Notes:  (Nursing/MHT/Case Management/Adjunct)  Date:  11/19/2022  Time:  9:38 AM  Type of Therapy:  Psychoeducational Skills  Participation Level:  Active  Participation Quality:  Appropriate and Attentive  Affect:  Anxious and Excited  Cognitive:  Alert and Appropriate  Insight:  Appropriate  Engagement in Group:  Engaged  Modes of Intervention:  Discussion  Summary of Progress/Problems: Discussed goal and orientation.  Stated goal is to get better.  Mario Thomas 11/19/2022, 9:38 AM

## 2022-11-19 NOTE — Plan of Care (Signed)
  Problem: Education: Goal: Will be free of psychotic symptoms Outcome: Progressing   Problem: Education: Goal: Knowledge of the prescribed therapeutic regimen will improve Outcome: Progressing   Problem: Coping: Goal: Coping ability will improve Outcome: Progressing   Problem: Health Behavior/Discharge Planning: Goal: Compliance with prescribed medication regimen will improve Outcome: Progressing   Problem: Role Relationship: Goal: Ability to interact with others will improve Outcome: Progressing   Problem: Safety: Goal: Ability to remain free from injury will improve Outcome: Progressing   Problem: Self-Concept: Goal: Will verbalize positive feelings about self Outcome: Progressing   Problem: Education: Goal: Mental status will improve Outcome: Progressing

## 2022-11-19 NOTE — Group Note (Signed)
[facility] LCSW Group Therapy Note  Date/Time: 12/11 @ 1pm  Type of Therapy and Topic:  Group Therapy:  Communication  Participation Level:  Active  Description of Group:    In this group patients will be encouraged to explore how individuals communicate with one another appropriately and inappropriately. Patients will be guided to discuss their thoughts, feelings, and behaviors related to barriers communicating feelings, needs, and stressors. The group will process together ways to execute positive and appropriate communications, with attention given to how one use behavior, tone, and body language to communicate. Patient will be encouraged to reflect on an incident where they were successfully able to communicate and the factors that they believe helped them to communicate. Each patient will be encouraged to identify specific changes they are motivated to make in order to overcome communication barriers with self, peers, authority, and parents. This group will be process-oriented, with patients participating in exploration of their own experiences as well as giving and receiving support and challenging self as well as other group members.  Therapeutic Goals: Patient will identify how people communicate (body language, facial expression, and electronics) Also discuss tone, voice and how these impact what is communicated and how the message is perceived.  Patient will identify feelings (such as fear or worry), thought process and behaviors related to why people internalize feelings rather than express self openly. Patient will identify two changes they are willing to make to overcome communication barriers. Members will then practice through Role Play how to communicate by utilizing psycho-education material (such as I Feel statements and acknowledging feelings rather than displacing on others)   Summary of Patient Progress: Patient participated in group. At times he would laugh inappropriately and seemed  to be responding to internal stimuli. Patient was able to participate in activity and provide feedback to peers.      Therapeutic Modalities:   Cognitive Behavioral Therapy Solution Focused Therapy Motivational Interviewing Family Systems Approach   Mily Malecki [place], LCSW, LCAS Clincal Social Worker  [facility]

## 2022-11-20 LAB — RESP PANEL BY RT-PCR (RSV, FLU A&B, COVID)  RVPGX2
Influenza A by PCR: NEGATIVE
Influenza B by PCR: NEGATIVE
Resp Syncytial Virus by PCR: NEGATIVE
SARS Coronavirus 2 by RT PCR: NEGATIVE

## 2022-11-20 NOTE — Progress Notes (Signed)
   11/20/22 2000  Psych Admission Type (Psych Patients Only)  Admission Status Involuntary  Psychosocial Assessment  Patient Complaints Anxiety;Restlessness  Eye Contact Fair  Facial Expression Animated;Anxious  Affect Preoccupied  Speech Pressured  Interaction Intrusive  Motor Activity Pacing  Appearance/Hygiene Disheveled  Behavior Characteristics Cooperative  Mood Preoccupied  Aggressive Behavior  Effect No apparent injury  Thought Process  Coherency Disorganized;Circumstantial  Content Religiosity  Delusions Religious  Perception Hallucinations  Hallucination Auditory  Judgment Poor  Confusion None  Danger to Self  Current suicidal ideation? Denies  Danger to Others  Danger to Others None reported or observed

## 2022-11-20 NOTE — Progress Notes (Signed)
[facility] MD Progress Note  11/20/2022 7:49 AM Mario Thomas  MRN:  [id number]   Reason for Admission:  Mario Thomas is a 21 yo male with documented PPHx of Bipolar I disorder, schizophrenia, MDD, GAD, and previous IVCs who presents involuntarily under IVC for bizarre, erratic behavior in the setting of medication non-compliance   Chart Review from last 24 hours:  The patient's chart was reviewed and nursing notes were reviewed. The patient's case was discussed in multidisciplinary team meeting.  Per RN, patient continues to pace but is less restless compared to days prior.  - Overnight events to report per chart review: No major overnight events. - Patient received all scheduled medications - Patient received the following PRN medications: Atarax (1x),   Subjective:   Patient evaluated on the unit, is seen pacing along the hallway. Reports his mood as "better than excellent". Endorses adequate sleep and appetite. Is minimal when answering questions, denies any suicidal ideation. When asked to elaborate on improvements compared to yesterday, patient says " much better". Patient discussed making friends and attending group sessions as goals for today. He denies any symptoms of restlessness. Asked all questions appropriately, is less disorganized today.   On assessment, patient denies any homicidal ideation. He denies any auditory or visual hallucinations. He denies thought insertion, thought broadcasting, or ideas of reference.   Patient denies any side effects to currently prescribed psychiatric medication. He denies any somatic symptoms.    Principal Problem: Schizoaffective disorder, bipolar type (HCC) Diagnosis: Principal Problem:   Schizoaffective disorder, bipolar type (HCC)  Total Time spent with patient: 20 minutes  Past Psychiatric History: as above  Past Medical History:  Past Medical History:  Diagnosis Date   Heart murmur    MDD (major depressive disorder), severe (HCC) 08/23/2018    History reviewed. No pertinent surgical history. Family History:  Family History  Problem Relation Age of Onset   Hyperlipidemia Father    Family Psychiatric  History: none Social History:  Social History   Substance and Sexual Activity  Alcohol Use Not Currently     Social History   Substance and Sexual Activity  Drug Use Never   Comment: Denies    Social History   Socioeconomic History   Marital status: Single    Spouse name: Not on file   Number of children: Not on file   Years of education: Not on file   Highest education level: Not on file  Occupational History   Not on file  Tobacco Use   Smoking status: Never   Smokeless tobacco: Never  Vaping Use   Vaping Use: Never used  Substance and Sexual Activity   Alcohol use: Not Currently   Drug use: Never    Comment: Denies   Sexual activity: Never  Other Topics Concern   Not on file  Social History Narrative   01/23/21   From: the area   Living: with parents   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, 1 sibling      Enjoys: running      Exercise: daily running    Diet: meat, veggies, grains      Safety   Seat belts: Yes    Guns: No   Safe in relationships: Yes    Social Determinants of Health   Financial Resource Strain: Not on file  Food Insecurity: No Food Insecurity (11/15/2022)   Hunger Vital Sign    Worried  About Running Out of Food in the Last Year: Never true    Ran Out of Food in the Last Year: Never true  Transportation Needs: No Transportation Needs (11/15/2022)   PRAPARE - Administrator, Civil Service (Medical): No    Lack of Transportation (Non-Medical): No  Physical Activity: Not on file  Stress: Not on file  Social Connections: Not on file   Additional Social History:   Sleep: Fair  Appetite:  Fair  Current Medications: Current Facility-Administered Medications  Medication Dose Route Frequency Provider  Last Rate Last Admin   acetaminophen (TYLENOL) tablet 650 mg  650 mg Oral Q6H PRN Adegbola, Bukola O, NP       alum & mag hydroxide-simeth (MAALOX/MYLANTA) 200-200-20 MG/5ML suspension 30 mL  30 mL Oral Q4H PRN Adegbola, Bukola O, NP       benztropine (COGENTIN) tablet 1 mg  1 mg Oral BID Hill, Shelbie Hutching, MD   1 mg at 11/20/22 0737   haloperidol (HALDOL) tablet 5 mg  5 mg Oral Q6H PRN Roselle Locus, MD   5 mg at 11/18/22 0810   And   benztropine (COGENTIN) tablet 1 mg  1 mg Oral Q6H PRN Roselle Locus, MD   1 mg at 11/18/22 2482   And   LORazepam (ATIVAN) tablet 2 mg  2 mg Oral Q6H PRN Roselle Locus, MD   2 mg at 11/18/22 0810   haloperidol lactate (HALDOL) injection 5 mg  5 mg Intramuscular Q6H PRN Roselle Locus, MD   5 mg at 11/15/22 1258   And   benztropine mesylate (COGENTIN) injection 1 mg  1 mg Intramuscular Q6H PRN Roselle Locus, MD   1 mg at 11/15/22 1258   And   LORazepam (ATIVAN) injection 2 mg  2 mg Intravenous Q6H PRN Roselle Locus, MD   2 mg at 11/15/22 1258   divalproex (DEPAKOTE) DR tablet 750 mg  750 mg Oral Q12H Massengill, Harrold Donath, MD   750 mg at 11/20/22 0737   haloperidol (HALDOL) tablet 5 mg  5 mg Oral Q12H Massengill, Harrold Donath, MD   5 mg at 11/20/22 0737   hydrOXYzine (ATARAX) tablet 25 mg  25 mg Oral TID PRN Adin Hector, NP   25 mg at 11/19/22 5003   LORazepam (ATIVAN) tablet 1 mg  1 mg Oral Q12H Carrion-Carrero, Charelle Petrakis, MD   1 mg at 11/20/22 0737   magnesium hydroxide (MILK OF MAGNESIA) suspension 30 mL  30 mL Oral Daily PRN Adegbola, Denny Levy, NP       propranolol (INDERAL) tablet 10 mg  10 mg Oral Q12H Massengill, Harrold Donath, MD   10 mg at 11/20/22 0737   traZODone (DESYREL) tablet 50 mg  50 mg Oral QHS Hill, Shelbie Hutching, MD   50 mg at 11/19/22 2045    Lab Results: No results found for this or any previous visit (from the past 48 hour(s)).  Blood Alcohol level:  Lab Results  Component Value Date   [facility] <10  11/14/2022   ETH <10 10/09/2022    Metabolic Disorder Labs: Lab Results  Component Value Date   HGBA1C 4.8 09/25/2022   MPG 91.06 09/25/2022   MPG 93.93 05/28/2022   Lab Results  Component Value Date   PROLACTIN 10.0 08/24/2018   Lab Results  Component Value Date   CHOL 122 09/25/2022   TRIG 27 09/25/2022   HDL 42 09/25/2022   CHOLHDL 2.9 09/25/2022   VLDL 5  09/25/2022   [place] 75 09/25/2022   [place] 41 05/28/2022    Psychiatric Specialty Exam:  Presentation  General Appearance: Disheveled Eye Contact: Normal Speech: Normal rate; clear coherent Speech Volume: Normal Handedness: Right  Mood and Affect  Mood: "I feel better than excellent" Affect: Incongruent; inappropriate  Thought Process  Thought Processes: less disorganized; irrelevant Descriptions of Associations: Tangential Orientation: Full (time, place and person) Thought Content: Illogical; scattered History of Schizophrenia/Schizoaffective disorder: Yes Duration of Psychotic Symptoms: Less than 6 months Hallucinations: Patient denies any hallucinations, appears internally preoccupied Ideas of Reference: Delusions Suicidal Thoughts: Denies Homicidal Thoughts: Denies  Sensorium  Memory: Poor Judgment: Poor Insight: None  Executive Functions  Concentration: Fair Attention Span: Fair Recall: [place] of Knowledge: Poor Language: Fair  Psychomotor Activity  Psychomotor Activity: less hyperactive; restlessness; concern for EPS-akathisia  Assets  Assets: Physical health  Sleep  Sleep: Good, 8.25 hours   Physical Exam: Physical Exam Constitutional:      General: He is not in acute distress.    Appearance: Normal appearance. He is not ill-appearing.  Pulmonary:     Effort: Pulmonary effort is normal. No respiratory distress.  Musculoskeletal:        General: Normal range of motion.  Skin:    General: Skin is warm and dry.  Neurological:     Mental Status: He is alert.    Review  of Systems  Respiratory:  Negative for shortness of breath.   Cardiovascular:  Negative for chest pain.  Gastrointestinal:  Negative for abdominal pain, constipation and diarrhea.  Neurological:  Negative for dizziness.  Psychiatric/Behavioral:  Negative for depression, hallucinations, memory loss, substance abuse and suicidal ideas. The patient is not nervous/anxious and does not have insomnia.    Blood pressure 109/61, pulse (!) 114, temperature 98.1 F (36.7 C), temperature source Oral, resp. rate 20, height 5\' 10"  (1.778 m), weight 97.5 kg, SpO2 99 %. Body mass index is 30.85 kg/m.    Treatment Plan Summary: Daily contact with patient to assess and evaluate symptoms and progress in treatment and Medication management ASSESSMENT: Mario Thomas is a 21 yo male with documented PPHx of Bipolar I disorder, schizophrenia, MDD, GAD, and previous IVCs who presents involuntarily under IVC for bizarre, erratic behavior in the setting of medication non-compliance.  Patient appears to be acutely psychotic, with disorganized thoughts, he is illogical and delusional. There is an extensive history of medication noncompliance, per previous notes there have been plans to place patient on LAI and has refused injections.   Patient appears less erratic, less bizarre today. He continues to pace along the unit hallway, but is no longer running. Concentration and attention are improving, but is minimal and unable to elaborate on certain questions.  His thought process is more organized, is able to answer questions logically. Appears to be improving on Haldol and depakote (which was increased to 750 mg), along with scheduled ativan. Today is his last day of risperdal. There was an extensive discussion regarding patient's mother, who has a history of contributing to patient's medication non-adherence, has told members of the treatment team that she does not like her son taking anti-psychotics. There is concern for medication  non-adherence when patient returns home as he has admitted to Dr. Winfred Leeds that his mother has taken his medication away prior to admission.   Diagnoses / Active Problems: Schizoaffective Disorder, Bipolar Type   PLAN: Safety and Monitoring:             -- Involuntary admission  to inpatient psychiatric unit for safety, stabilization and treatment             -- Daily contact with patient to assess and evaluate symptoms and progress in treatment             -- Patient's case to be discussed in multi-disciplinary team meeting             -- Observation Level : q15 minute checks             -- Vital signs:  q12 hours             -- Precautions: suicide, elopement, and assault   2. Psychiatric Diagnoses and Treatment:  Cross titration as planned below: Sunday 12/10 Risperdal 3 mg BID ; Haldol 5 mg daily + Depakote 500 mg  as tolerated Monday 12/11 Risperdal 2 mg BID ; Haldol 5 mg daily + Depakote increased to 750 mg  Tuesday 12/12 Risperdal 1 mg BID ; Haldol 5 mg daily + Depakote 750 mg  as tolerated.  Continue scheduled Ativan 1 mg Q12 Hrs for restlessness Continue Cogentin 1 mg twice daily for EPS prophylaxis -- The risks/benefits/side-effects/alternatives to this medication were discussed in detail with the patient and time was given for questions. The patient consents to medication trial.              -- Metabolic profile and EKG monitoring obtained while on an atypical antipsychotic  Body mass index is 30.85 kg/m.  Qtc: 430 A1C: 4.8 Lipid Panel     Component Value Date/Time   CHOL 122 09/25/2022 0647   TRIG 27 09/25/2022 0647   HDL 42 09/25/2022 0647   CHOLHDL 2.9 09/25/2022 0647   VLDL 5 09/25/2022 0647   LDLCALC 75 09/25/2022 0647                -- Encouraged patient to participate in unit milieu and in scheduled group therapies              -- Short Term Goals: Ability to maintain clinical measurements within normal limits will improve             -- Long Term Goals:  Improvement in symptoms so as ready for discharge                3. Medical Issues Being Addressed:              Tobacco Use Disorder             -- Nicotine patch 21mg /24 hours ordered             -- Smoking cessation encouraged   4. Discharge Planning:              -- Social work and case management to assist with discharge planning and identification of hospital follow-up needs prior to discharge             -- Estimated LOS: 5-7 days             -- Discharge Concerns: Need to establish a safety plan; Medication compliance and effectiveness             -- Discharge Goals: Return home with outpatient referrals for mental health follow-up including medication management/psychotherapy    Christene Slates, MD 11/20/2022, 7:49 AM

## 2022-11-20 NOTE — Progress Notes (Signed)
   11/20/22 0530  Sleep  Number of Hours 7.75

## 2022-11-20 NOTE — Progress Notes (Signed)
Collateral Call  Patient's mother,  Nigel Berthold, was contacted for collateral call at 3:00 PM  on 11/20/22 , at this number [phone].  Meseret worries that reason patient was admitted was actually due to feeling depressed and disappointed that he was not meeting family's expectations.  She believes the trigger that caused his most recent hospitalization was not applying to college.  She attributes medication nonadherence this due to patient's poor sleep related to this depression.  She expresses unhappiness at changes to patient's psychiatric medications.  Feels that patient does not look well, would like for patient to be discharged soon as possible.  Based on our conversation, it is unclear how patient feels in regards to continuing treatment regiment.  States that she does not want her son to be experimented on.  Wishes to leave [place], is unhappy with the mental health services her son has received here.  Attempted to explain patient's current diagnosis was explained as evidenced by specific signs/symptoms.  Attempted to assist mother with understanding implications for treatment based on diagnosis and current level of behaviors/symptoms.   Lorri Frederick, MD PGY-1

## 2022-11-20 NOTE — [facility] Suicide Risk Assessment (Signed)
[facility] INPATIENT:  Family/Significant Other Suicide Prevention Education  Suicide Prevention Education:  Education Completed; Mario Thomas, mother,  [phone]  (name of family member/significant other) has been identified by the patient as the family member/significant other with whom the patient will be residing, and identified as the person(s) who will aid the patient in the event of a mental health crisis (suicidal ideations/suicide attempt).  With written consent from the patient, the family member/significant other has been provided the following suicide prevention education, prior to the and/or following the discharge of the patient.  Mother states that she feels that patient is overmedicated.  She reports that he gets worse the more medication he is on.  Mother states that he needs counselors and people to work with him on his goals.  No additional safety issues and no access to guns at this time. Patient can return to stay with mother.  She reports that she would like to speak with a doctor about medications and that she believes she is going to pursue guardianship. CSW notified doctor that mother would like to speak with them.  The suicide prevention education provided includes the following: Suicide risk factors Suicide prevention and interventions National Suicide Hotline telephone number [facility] assessment telephone number [facility] Emergency Assistance 911 [facility] and/or Residential Mobile Crisis Unit telephone number  Request made of family/significant other to: Remove weapons (e.g., guns, rifles, knives), all items previously/currently identified as safety concern.   Remove drugs/medications (over-the-counter, prescriptions, illicit drugs), all items previously/currently identified as a safety concern.  The family member/significant other verbalizes understanding of the suicide prevention education information provided.  The family member/significant other  agrees to remove the items of safety concern listed above.  Mario Thomas 11/20/2022, 1:20 PM

## 2022-11-20 NOTE — Progress Notes (Signed)
   11/20/22 0900  Psych Admission Type (Psych Patients Only)  Admission Status Involuntary  Psychosocial Assessment  Patient Complaints Anxiety;Restlessness  Eye Contact Fair  Facial Expression Animated;Anxious  Affect Preoccupied  Speech Pressured  Interaction Intrusive  Motor Activity Pacing  Appearance/Hygiene Disheveled  Behavior Characteristics Cooperative  Mood Preoccupied  Aggressive Behavior  Effect No apparent injury  Thought Process  Coherency Disorganized;Circumstantial  Content Religiosity  Delusions Religious  Perception Hallucinations  Hallucination Auditory  Judgment Poor  Confusion None  Danger to Self  Current suicidal ideation? Denies  Agreement Not to Harm Self Yes  Description of Agreement Verbal  Danger to Others  Danger to Others None reported or observed

## 2022-11-20 NOTE — Group Note (Signed)
Recreation Therapy Group Note   Group Topic:Other  Group Date: 11/20/2022 Start Time: 1005 End Time: 1030 Facilitators: Posey Jasmin-McCall, LRT,CTRS Location: [address] Dayroom   Goal Area(s) Addresses:  Patient will participate in the creative process to complete all crafts.  Patient will interact pro-socially with staff and peers. Patient will share traditions, activities, and positive feelings produced during the holidays.    Group Description: LRT facilitated a therapeutic art activity to encourage self-expression and creativity in recognition of the approaching holidays. Writer explained patients could make snowmen, wreaths or missle that  will be used to decorate the unit to boost mood and create a positive atmosphere. Patients were encouraged to engage in leisure discussions about festive activities and hobbies they like to participate in during the winter season.   Affect/Mood: N/A   Participation Level: Did not attend    Clinical Observations/Individualized Feedback:      Plan: Continue to engage patient in RT group sessions 2-3x/week.   Lakita Sahlin-McCall, LRT,CTRS 11/20/2022 10:55 AM

## 2022-11-21 ENCOUNTER — Encounter (HOSPITAL_COMMUNITY): Payer: Self-pay

## 2022-11-21 NOTE — Progress Notes (Signed)
   11/21/22 2015  Psych Admission Type (Psych Patients Only)  Admission Status Involuntary  Psychosocial Assessment  Patient Complaints Anxiety  Eye Contact Fair  Facial Expression Animated;Anxious  Affect Preoccupied  Speech Pressured  Interaction Intrusive  Motor Activity Pacing  Appearance/Hygiene Disheveled  Behavior Characteristics Cooperative  Mood Preoccupied  Aggressive Behavior  Effect No apparent injury  Thought Process  Coherency Disorganized;Circumstantial  Content Religiosity  Delusions Religious  Perception Hallucinations  Hallucination Auditory  Judgment Poor  Confusion None  Danger to Self  Current suicidal ideation? Denies  Danger to Others  Danger to Others None reported or observed

## 2022-11-21 NOTE — [facility] Group Notes (Signed)
Adult Psychoeducational Group Note  Date:  11/21/2022 Time:  9:54 AM  Group Topic/Focus:  Goals Group:   The focus of this group is to help patients establish daily goals to achieve during treatment and discuss how the patient can incorporate goal setting into their daily lives to aide in recovery.  Participation Level:  Did Not Attend   Donell Beers 11/21/2022, 9:54 AM

## 2022-11-21 NOTE — Plan of Care (Signed)
  Problem: Education: Goal: Will be free of psychotic symptoms Outcome: Progressing Goal: Knowledge of the prescribed therapeutic regimen will improve Outcome: Progressing   Problem: Coping: Goal: Coping ability will improve Outcome: Progressing Goal: Will verbalize feelings Outcome: Progressing   

## 2022-11-21 NOTE — Group Note (Signed)
[facility] LCSW Group Therapy Note   Group Date: 11/21/2022 Start Time: 1300 End Time: 1400  Type of Therapy and Topic:  Group Therapy:  Self-Care Wheel  Participation Level:   Minimum   Description of Group This process group involved patients discussing the importance of self-care in different areas of life (professional, personal, emotional, psychological, spiritual, and physical) in order to achieve healthy life balance.  The group talked about what self-care in each of those areas would constitute and then specifically listed how they want to provide themselves with improved self-care.  Therapeutic Goals Patient will learn how to break self-care down into various areas of life Patient will participate in generating ideas about healthy self-care options in each category Patients will be supportive of one another and receive support from others Patient will identify one healthy self-care activity to add to his/her life   Summary of Patient Progress: Patient came to group and accepted the materials provided. Patient gave positive insight on different self-care activities he indulges in and a definition of what he considers being self-care.  Patient was respectful and only answered when facilitator asked him a question directly. Patient remain in group the entire time.     Therapeutic Modalities Processing Psychoeducation    Isabella Bowens, LCSWA

## 2022-11-21 NOTE — Progress Notes (Signed)
[facility]  11/21/2022 8:56 AM Mario Thomas  MRN:  [id number]   Reason for Admission:  Jospeh Piatt is a 21 yo male with documented PPHx of Bipolar I disorder, schizophrenia, MDD, GAD, and previous IVCs who presents involuntarily under IVC for bizarre, erratic behavior in the setting of medication non-compliance   Chart Review from last 24 hours:  The patient's chart was reviewed and nursing notes were reviewed. The patient's case was discussed in multidisciplinary team meeting.  Per RN, patient appears improved, at baseline. Confirm conversations with mother, who wishes patient to be discharged immediately, and believe she is being overmedicated. - Overnight events to report per chart review: No major overnight events. - Patient received all scheduled medications - Patient received the following PRN medications:  Subjective:   Patient evaluated at bedside.  Reports his mood is "better", feels Colmer and at baseline.  He endorses adequate sleep and appetite.  Reports having spoken to mother yesterday.  Discussed the importance of medication adherence following discharge, patient is in agreement.  He reports that in the past, his mother has taken away his medication and told them he does not need them.  Patient was offered the opportunity to transition to an LAI, he declines, states that he does not like needles and would rather take p.o. medication.   Patient denies any suicidal ideation, contracts for safety on the unit.  He denies any homicidal ideation.  He denies any auditory or visual hallucinations.  He denies any thought insertion, thought withdrawal, and ideas of reference.  There are no apparent paranoid ideations.  On assessment, patient denies any side effects to currently prescribed psychiatric medications.  He denies any somatic symptoms.   Principal Problem: Schizoaffective disorder, bipolar type (HCC) Diagnosis: Principal Problem:   Schizoaffective disorder, bipolar type  (HCC)  Total Time spent with patient: 20 minutes  Past Psychiatric History: as above  Past Medical History:  Past Medical History:  Diagnosis Date   Heart murmur    MDD (major depressive disorder), severe (HCC) 08/23/2018   History reviewed. No pertinent surgical history. Family History:  Family History  Problem Relation Age of Onset   Hyperlipidemia Father    Family Psychiatric  History: none Social History:  Social History   Substance and Sexual Activity  Alcohol Use Not Currently     Social History   Substance and Sexual Activity  Drug Use Never   Comment: Denies    Social History   Socioeconomic History   Marital status: Single    Spouse name: Not on file   Number of children: Not on file   Years of education: Not on file   Highest education level: Not on file  Occupational History   Not on file  Tobacco Use   Smoking status: Never   Smokeless tobacco: Never  Vaping Use   Vaping Use: Never used  Substance and Sexual Activity   Alcohol use: Not Currently   Drug use: Never    Comment: Denies   Sexual activity: Never  Other Topics Concern   Not on file  Social History Narrative   01/23/21   From: the area   Living: with parents   Work: Counselling psychologist[employer]   School: Actuarylectrical engineering at [employer]      Family: good relationship with parents, 1 sibling      Enjoys: running      Exercise: daily running    Diet: meat, veggies, grains      Safety  Seat belts: Yes    Guns: No   Safe in relationships: Yes    Social Determinants of Health   Financial Resource Strain: Not on file  Food Insecurity: No Food Insecurity (11/15/2022)   Hunger Vital Sign    Worried About Running Out of Food in the Last Year: Never true    Ran Out of Food in the Last Year: Never true  Transportation Needs: No Transportation Needs (11/15/2022)   PRAPARE - Administrator, Civil Service (Medical): No    Lack of Transportation (Non-Medical): No  Physical Activity: Not on  file  Stress: Not on file  Social Connections: Not on file   Additional Social History:   Sleep: Fair  Appetite:  Fair  Current Medications: Current Facility-Administered Medications  Medication Dose Route Frequency Provider Last Rate Last Admin   acetaminophen (TYLENOL) tablet 650 mg  650 mg Oral Q6H PRN Adegbola, Bukola O, NP       alum & mag hydroxide-simeth (MAALOX/MYLANTA) 200-200-20 MG/5ML suspension 30 mL  30 mL Oral Q4H PRN Adegbola, Bukola O, NP       benztropine (COGENTIN) tablet 1 mg  1 mg Oral BID Hill, Shelbie Hutching, MD   1 mg at 11/21/22 0842   haloperidol (HALDOL) tablet 5 mg  5 mg Oral Q6H PRN Roselle Locus, MD   5 mg at 11/18/22 0810   And   benztropine (COGENTIN) tablet 1 mg  1 mg Oral Q6H PRN Roselle Locus, MD   1 mg at 11/18/22 4132   And   LORazepam (ATIVAN) tablet 2 mg  2 mg Oral Q6H PRN Roselle Locus, MD   2 mg at 11/18/22 0810   haloperidol lactate (HALDOL) injection 5 mg  5 mg Intramuscular Q6H PRN Roselle Locus, MD   5 mg at 11/15/22 1258   And   benztropine mesylate (COGENTIN) injection 1 mg  1 mg Intramuscular Q6H PRN Roselle Locus, MD   1 mg at 11/15/22 1258   And   LORazepam (ATIVAN) injection 2 mg  2 mg Intravenous Q6H PRN Roselle Locus, MD   2 mg at 11/15/22 1258   divalproex (DEPAKOTE) DR tablet 750 mg  750 mg Oral Q12H Massengill, Harrold Donath, MD   750 mg at 11/21/22 0843   haloperidol (HALDOL) tablet 5 mg  5 mg Oral Q12H Massengill, Harrold Donath, MD   5 mg at 11/21/22 4401   hydrOXYzine (ATARAX) tablet 25 mg  25 mg Oral TID PRN Adin Hector, NP   25 mg at 11/20/22 2050   LORazepam (ATIVAN) tablet 1 mg  1 mg Oral Q12H Carrion-Carrero, Calle Schader, MD   1 mg at 11/21/22 0844   magnesium hydroxide (MILK OF MAGNESIA) suspension 30 mL  30 mL Oral Daily PRN Adegbola, Denny Levy, NP       propranolol (INDERAL) tablet 10 mg  10 mg Oral Q12H Massengill, Harrold Donath, MD   10 mg at 11/21/22 0842   traZODone (DESYREL) tablet 50  mg  50 mg Oral QHS Hill, Shelbie Hutching, MD   50 mg at 11/20/22 2050    Lab Results:  Results for orders placed or performed during the hospital encounter of 11/15/22 (from the past 48 hour(s))  Resp panel by RT-PCR (RSV, Flu A&B, Covid) Anterior Nasal Swab     Status: None   Collection Time: 11/20/22  3:01 PM   Specimen: Anterior Nasal Swab  Result Value Ref Range   SARS Coronavirus 2 by RT  PCR NEGATIVE NEGATIVE    Comment: (NOTE) SARS-CoV-2 target nucleic acids are NOT DETECTED.  The SARS-CoV-2 RNA is generally detectable in upper respiratory specimens during the acute phase of infection. The lowest concentration of SARS-CoV-2 viral copies this assay can detect is 138 copies/mL. A negative result does not preclude SARS-Cov-2 infection and should not be used as the sole basis for treatment or other patient management decisions. A negative result may occur with  improper specimen collection/handling, submission of specimen other than nasopharyngeal swab, presence of viral mutation(s) within the areas targeted by this assay, and inadequate number of viral copies(<138 copies/mL). A negative result must be combined with clinical observations, patient history, and epidemiological information. The expected result is Negative.  Fact Sheet for Patients:  BloggerCourse.com  Fact Sheet for Healthcare Providers:  SeriousBroker.it  This test is no t yet approved or cleared by the [place] FDA and  has been authorized for detection and/or diagnosis of SARS-CoV-2 by FDA under an Emergency Use Authorization (EUA). This EUA will remain  in effect (meaning this test can be used) for the duration of the COVID-19 declaration under Section 564(b)(1) of the Act, 21 U.S.C.section 360bbb-3(b)(1), unless the authorization is terminated  or revoked sooner.       Influenza A by PCR NEGATIVE NEGATIVE   Influenza B by PCR NEGATIVE NEGATIVE     Comment: (NOTE) The Xpert Xpress SARS-CoV-2/FLU/RSV plus assay is intended as an aid in the diagnosis of influenza from Nasopharyngeal swab specimens and should not be used as a sole basis for treatment. Nasal washings and aspirates are unacceptable for Xpert Xpress SARS-CoV-2/FLU/RSV testing.  Fact Sheet for Patients: BloggerCourse.com  Fact Sheet for Healthcare Providers: SeriousBroker.it  This test is not yet approved or cleared by the [place] FDA and has been authorized for detection and/or diagnosis of SARS-CoV-2 by FDA under an Emergency Use Authorization (EUA). This EUA will remain in effect (meaning this test can be used) for the duration of the COVID-19 declaration under Section 564(b)(1) of the Act, 21 U.S.C. section 360bbb-3(b)(1), unless the authorization is terminated or revoked.     Resp Syncytial Virus by PCR NEGATIVE NEGATIVE    Comment: (NOTE) Fact Sheet for Patients: BloggerCourse.com  Fact Sheet for Healthcare Providers: SeriousBroker.it  This test is not yet approved or cleared by the [place] FDA and has been authorized for detection and/or diagnosis of SARS-CoV-2 by FDA under an Emergency Use Authorization (EUA). This EUA will remain in effect (meaning this test can be used) for the duration of the COVID-19 declaration under Section 564(b)(1) of the Act, 21 U.S.C. section 360bbb-3(b)(1), unless the authorization is terminated or revoked.  Performed at [facility], [address] [place], [place] [address]     Blood Alcohol level:  Lab Results  Component Value Date   [facility] <10 11/14/2022   ETH <10 10/09/2022    Metabolic Disorder Labs: Lab Results  Component Value Date   HGBA1C 4.8 09/25/2022   MPG 91.06 09/25/2022   MPG 93.93 05/28/2022   Lab Results  Component Value Date   PROLACTIN 10.0 08/24/2018   Lab  Results  Component Value Date   CHOL 122 09/25/2022   TRIG 27 09/25/2022   HDL 42 09/25/2022   CHOLHDL 2.9 09/25/2022   VLDL 5 09/25/2022   LDLCALC 75 09/25/2022   LDLCALC 41 05/28/2022   Psychiatric Specialty Exam:  Presentation  General Appearance: Appropriate for environment; fairly groomed Eye Contact: Good Speech: Clear and coherent  Speech Volume: Normal Handedness: Right  Mood and Affect  Mood: "Better" Affect: Flat  Thought Process  Thought Processes: Organized; coherent; linear Descriptions of Associations: Intact Orientation: Full (time, place and person) Thought Content: Logical History of Schizophrenia/Schizoaffective disorder: Yes Duration of Psychotic Symptoms: Less than 6 months Hallucinations: Denies Ideas of Reference: Denies Suicidal Thoughts: Denies Homicidal Thoughts: Denies  Sensorium  Memory: Immediate, recent, and remote improved Judgment: Good Insight: Less shallow  Executive Functions  Concentration: Good Attention Span: Good Recall: Good Fund of Knowledge: Fair Language: Good  Psychomotor Activity  Psychomotor Activity: Normal; no restlessness noted on exam  Assets  Assets: Communication skills; desire for improvement; housing  Sleep  Sleep: Good   Physical Exam: Physical Exam Constitutional:      General: He is not in acute distress.    Appearance: Normal appearance. He is normal weight. He is not ill-appearing.  Pulmonary:     Effort: Pulmonary effort is normal. No respiratory distress.  Musculoskeletal:        General: Normal range of motion.     Cervical back: Normal range of motion.  Skin:    General: Skin is warm and dry.  Neurological:     General: No focal deficit present.     Mental Status: He is alert and oriented to person, place, and time.  Psychiatric:        Mood and Affect: Mood normal.        Behavior: Behavior normal.        Thought Content: Thought content normal.        Judgment: Judgment normal.     Review of Systems  Constitutional:  Negative for chills and fever.  Respiratory:  Negative for shortness of breath.   Cardiovascular:  Negative for chest pain.  Gastrointestinal:  Negative for abdominal pain, constipation and diarrhea.  Musculoskeletal:  Negative for myalgias.  Neurological:  Negative for sensory change.  Psychiatric/Behavioral:  Negative for depression, hallucinations, memory loss, substance abuse and suicidal ideas. The patient is not nervous/anxious and does not have insomnia.    Blood pressure 120/80, pulse (!) 121, temperature 97.8 F (36.6 C), temperature source Oral, resp. rate 20, height 5\' 10"  (1.778 m), weight 97.5 kg, SpO2 100 %. Body mass index is 30.85 kg/m.    Treatment Plan Summary: Daily contact with patient to assess and evaluate symptoms and progress in treatment and Medication management ASSESSMENT: Mario Thomas is a 21 yo male with documented PPHx of Bipolar I disorder, schizophrenia, MDD, GAD, and previous IVCs who presents involuntarily under IVC for bizarre, erratic behavior in the setting of medication non-compliance.  Patient appears to be acutely psychotic, with disorganized thoughts, he is illogical and delusional. There is an extensive history of medication noncompliance, per previous notes there have been plans to place patient on LAI and has refused injections.   Patient's restlessness appears to have resolved, evaluated at bedside.  Concentration and attention are markedly improved.  Endorses improvement of symptoms, denies any side effects to currently prescribed psychiatric medication.  He is more organized and linear in his thought process.  Answers all questions appropriately.  Denies any perceptual disturbances.  Physical exam this morning is negative for EPS symptoms, negative for dystonic symptoms, parkinsonism, or TD.  Mother was contacted this morning regarding patient's dispo, she agrees to keep patient today in order to do Depakote  levels tomorrow morning.  There continues to be concerned for medication nonadherence, staff that have spoken to patient's mother confirms that she has  very little insight into his diagnosis and does not think that medication is necessary.  We will continue current medication regimen of Haldol and Depakote, Ativan will also be continued for restlessness with plans to taper down in outpatient setting.  Depakote levels will be checked tomorrow morning.  Diagnoses / Active Problems: Schizoaffective Disorder, Bipolar Type   PLAN: Safety and Monitoring:             -- Involuntary admission to inpatient psychiatric unit for safety, stabilization and treatment             -- Daily contact with patient to assess and evaluate symptoms and progress in treatment             -- Patient's case to be discussed in multi-disciplinary team meeting             -- Observation Level : q15 minute checks             -- Vital signs:  q12 hours             -- Precautions: suicide, elopement, and assault   2. Psychiatric Diagnoses and Treatment:  Cross titration as planned below: Continue Haldol 5 mg daily + Depakote 750 mg  as tolerated.  Continue scheduled Ativan 1 mg Q12 Hrs for restlessness Continue Cogentin 1 mg twice daily for EPS prophylaxis  -- The risks/benefits/side-effects/alternatives to this medication were discussed in detail with the patient and time was given for questions. The patient consents to medication trial.              -- Metabolic profile and EKG monitoring obtained while on an atypical antipsychotic  Body mass index is 30.85 kg/m.  Qtc: 430 A1C: 4.8 Lipid Panel     Component Value Date/Time   CHOL 122 09/25/2022 0647   TRIG 27 09/25/2022 0647   HDL 42 09/25/2022 0647   CHOLHDL 2.9 09/25/2022 0647   VLDL 5 09/25/2022 0647   LDLCALC 75 09/25/2022 0647                -- Encouraged patient to participate in unit milieu and in scheduled group therapies              -- Short Term  Goals: Ability to maintain clinical measurements within normal limits will improve             -- Long Term Goals: Improvement in symptoms so as ready for discharge                3. Medical Issues Being Addressed:              Tobacco Use Disorder             -- Nicotine patch 21mg /24 hours ordered             -- Smoking cessation encouraged   4. Discharge Planning:              -- Social work and case management to assist with discharge planning and identification of hospital follow-up needs prior to discharge             -- Estimated LOS: 5-7 days             -- Discharge Concerns: Need to establish a safety plan; Medication compliance and effectiveness             -- Discharge Goals: Return home with outpatient referrals for mental health follow-up including medication management/psychotherapy  Lorri Frederick, MD 11/21/2022, 8:56 AM

## 2022-11-21 NOTE — Group Note (Signed)
Recreation Therapy Group Note   Group Topic:Problem Solving  Group Date: 11/21/2022 Start Time: 1015 End Time: 1053 Facilitators: Joziah Dollins-McCall, LRT,CTRS Location: [address] Dayroom   Goal Area(s) Addresses:  Patient will verbalize importance of using appropriate problem solving techniques.  Patient will identify positive change associated with effective problem solving skills.   Group Description:  Code Breakers.  LRT and patients talked the importance of problem solving techniques.  LRT and patients also discussed how problem solving affects everyday decisions.  LRT then gave patients a worksheet on code breakers that helped them work on problem solving skills.  While patients worked, LRT played music for the patients.    Affect/Mood: Flat   Participation Level: Active   Participation Quality: Independent   Behavior: Withdrawn   Speech/Thought Process: Focused   Insight: Good   Judgement: Good   Modes of Intervention: Worksheet   Patient Response to Interventions:  Engaged   Education Outcome:  Acknowledges education and In group clarification offered    Clinical Observations/Individualized Feedback: Pt appeared tired and quiet during group.  Pt would engaged when prompted.  Pt was appropriate and was able to complete the activity.  Pt was pleasant as well.     Plan: Continue to engage patient in RT group sessions 2-3x/week.   Jovon Winterhalter-McCall, LRT,CTRS 11/21/2022 1:26 PM

## 2022-11-21 NOTE — Progress Notes (Signed)
   11/21/22 0800  Psych Admission Type (Psych Patients Only)  Admission Status Involuntary  Psychosocial Assessment  Patient Complaints Anxiety  Eye Contact Fair  Facial Expression Animated;Anxious  Affect Preoccupied  Speech Pressured  Interaction Isolative  Motor Activity Other (Comment) (WDL)  Appearance/Hygiene Unremarkable  Behavior Characteristics Cooperative  Mood Preoccupied  Aggressive Behavior  Effect No apparent injury  Thought Process  Coherency Disorganized  Content Religiosity  Delusions Religious  Perception Hallucinations  Hallucination Auditory  Judgment Poor  Confusion None  Danger to Self  Current suicidal ideation? Denies  Agreement Not to Harm Self Yes  Description of Agreement verbal  Danger to Others  Danger to Others None reported or observed

## 2022-11-21 NOTE — Progress Notes (Signed)
   11/21/22 0530  Sleep  Number of Hours 8.75

## 2022-11-21 NOTE — Progress Notes (Signed)
Patient is alert and oriented X's 4, and is able to hold a linear conversation with this Clinical research associate. Patient denies SI/HI/AVH, he is pleasant on approach, compliant with medication administration, tolerated them well with no side effect noted. Patient denies pain or any discomfort. No distress noted at this time. Staff will continue to provide support to patient.

## 2022-11-21 NOTE — BH IP Treatment Plan (Signed)
Interdisciplinary Treatment and Diagnostic Plan Update  11/21/2022 Time of Session: 9:50am  Mario Thomas MRN: [id number]  Principal Diagnosis: Schizoaffective disorder, bipolar type (HCC)  Secondary Diagnoses: Principal Problem:   Schizoaffective disorder, bipolar type (HCC)   Current Medications:  Current Facility-Administered Medications  Medication Dose Route Frequency Provider Last Rate Last Admin   acetaminophen (TYLENOL) tablet 650 mg  650 mg Oral Q6H PRN Adegbola, Bukola O, NP       alum & mag hydroxide-simeth (MAALOX/MYLANTA) 200-200-20 MG/5ML suspension 30 mL  30 mL Oral Q4H PRN Adegbola, Bukola O, NP       benztropine (COGENTIN) tablet 1 mg  1 mg Oral BID Hill, Shelbie Hutching, MD   1 mg at 11/21/22 0842   haloperidol (HALDOL) tablet 5 mg  5 mg Oral Q6H PRN Roselle Locus, MD   5 mg at 11/18/22 0810   And   benztropine (COGENTIN) tablet 1 mg  1 mg Oral Q6H PRN Roselle Locus, MD   1 mg at 11/18/22 5170   And   LORazepam (ATIVAN) tablet 2 mg  2 mg Oral Q6H PRN Roselle Locus, MD   2 mg at 11/18/22 0810   haloperidol lactate (HALDOL) injection 5 mg  5 mg Intramuscular Q6H PRN Roselle Locus, MD   5 mg at 11/15/22 1258   And   benztropine mesylate (COGENTIN) injection 1 mg  1 mg Intramuscular Q6H PRN Roselle Locus, MD   1 mg at 11/15/22 1258   And   LORazepam (ATIVAN) injection 2 mg  2 mg Intravenous Q6H PRN Roselle Locus, MD   2 mg at 11/15/22 1258   divalproex (DEPAKOTE) DR tablet 750 mg  750 mg Oral Q12H Massengill, Harrold Donath, MD   750 mg at 11/21/22 0843   haloperidol (HALDOL) tablet 5 mg  5 mg Oral Q12H Massengill, Harrold Donath, MD   5 mg at 11/21/22 0174   hydrOXYzine (ATARAX) tablet 25 mg  25 mg Oral TID PRN Adin Hector, NP   25 mg at 11/20/22 2050   LORazepam (ATIVAN) tablet 1 mg  1 mg Oral Q12H Carrion-Carrero, Margely, MD   1 mg at 11/21/22 0844   magnesium hydroxide (MILK OF MAGNESIA) suspension 30 mL  30 mL Oral Daily PRN  Adegbola, Denny Levy, NP       propranolol (INDERAL) tablet 10 mg  10 mg Oral Q12H Massengill, Harrold Donath, MD   10 mg at 11/21/22 0842   traZODone (DESYREL) tablet 50 mg  50 mg Oral QHS Hill, Shelbie Hutching, MD   50 mg at 11/20/22 2050   PTA Medications: Medications Prior to Admission  Medication Sig Dispense Refill Last Dose   benztropine (COGENTIN) 1 MG tablet Take 1 tablet (1 mg total) by mouth 2 (two) times daily. 60 tablet 0    hydrOXYzine (ATARAX) 25 MG tablet Take 1 tablet (25 mg total) by mouth 3 (three) times daily as needed for anxiety. 30 tablet 0    propranolol (INDERAL) 10 MG tablet Take 1 tablet (10 mg total) by mouth 2 (two) times daily. 60 tablet 0    risperiDONE (RISPERDAL M-TABS) 4 MG disintegrating tablet Take 1 tablet (4 mg total) by mouth 2 (two) times daily. 45 tablet 0    risperiDONE microspheres (RISPERDAL CONSTA) 25 MG injection Inject 2 mLs (25 mg total) into the muscle every 14 (fourteen) days. (Patient not taking: Reported on 11/14/2022) 1 each 0    traZODone (DESYREL) 50 MG tablet Take 1 tablet (50  mg total) by mouth at bedtime. 30 tablet 0     Patient Stressors: Medication change or noncompliance    Patient Strengths: Supportive family/friends   Treatment Modalities: Medication Management, Group therapy, Case management,  1 to 1 session with clinician, Psychoeducation, Recreational therapy.   Physician Treatment Plan for Primary Diagnosis: Schizoaffective disorder, bipolar type (HCC) Long Term Goal(s): Improvement in symptoms so as ready for discharge   Short Term Goals: Ability to maintain clinical measurements within normal limits will improve  Medication Management: Evaluate patient's response, side effects, and tolerance of medication regimen.  Therapeutic Interventions: 1 to 1 sessions, Unit Group sessions and Medication administration.  Evaluation of Outcomes: Progressing  Physician Treatment Plan for Secondary Diagnosis: Principal Problem:    Schizoaffective disorder, bipolar type (HCC)  Long Term Goal(s): Improvement in symptoms so as ready for discharge   Short Term Goals: Ability to maintain clinical measurements within normal limits will improve     Medication Management: Evaluate patient's response, side effects, and tolerance of medication regimen.  Therapeutic Interventions: 1 to 1 sessions, Unit Group sessions and Medication administration.  Evaluation of Outcomes: Progressing   RN Treatment Plan for Primary Diagnosis: Schizoaffective disorder, bipolar type (HCC) Long Term Goal(s): Knowledge of disease and therapeutic regimen to maintain health will improve  Short Term Goals: Ability to remain free from injury will improve, Ability to participate in decision making will improve, Ability to verbalize feelings will improve, Ability to disclose and discuss suicidal ideas, and Ability to identify and develop effective coping behaviors will improve  Medication Management: RN will administer medications as ordered by provider, will assess and evaluate patient's response and provide education to patient for prescribed medication. RN will report any adverse and/or side effects to prescribing provider.  Therapeutic Interventions: 1 on 1 counseling sessions, Psychoeducation, Medication administration, Evaluate responses to treatment, Monitor vital signs and CBGs as ordered, Perform/monitor CIWA, COWS, AIMS and Fall Risk screenings as ordered, Perform wound care treatments as ordered.  Evaluation of Outcomes: Progressing   LCSW Treatment Plan for Primary Diagnosis: Schizoaffective disorder, bipolar type (HCC) Long Term Goal(s): Safe transition to appropriate next level of care at discharge, Engage patient in therapeutic group addressing interpersonal concerns.  Short Term Goals: Engage patient in aftercare planning with referrals and resources, Increase social support, Increase emotional regulation, Facilitate acceptance of  mental health diagnosis and concerns, Identify triggers associated with mental health/substance abuse issues, and Increase skills for wellness and recovery  Therapeutic Interventions: Assess for all discharge needs, 1 to 1 time with Social worker, Explore available resources and support systems, Assess for adequacy in community support network, Educate family and significant other(s) on suicide prevention, Complete Psychosocial Assessment, Interpersonal group therapy.  Evaluation of Outcomes: Progressing   Progress in Treatment: Attending groups: Yes. Participating in groups: Yes. Taking medication as prescribed: Yes. Toleration medication: Yes. Family/Significant other contact made: No, will contact:  Meseret (mother) [phone] Patient understands diagnosis: No. Discussing patient identified problems/goals with staff: Yes. Medical problems stabilized or resolved: Yes. Denies suicidal/homicidal ideation: Yes. Issues/concerns per patient self-inventory: No.   New problem(s) identified: No, Describe:  None mentioned    New Short Term/Long Term Goal(s): medication stabilization, elimination of SI thoughts, development of comprehensive mental wellness plan.     Patient Goals:  "Be happy, communicate, be joyful, celebrate, and work on DC plans"   Discharge Plan or Barriers: Patient recently admitted. CSW will continue to follow and assess for appropriate referrals and possible discharge planning.  Reason for Continuation of Hospitalization: Aggression Delusions  Medication stabilization Suicidal ideation   Estimated Length of Stay: estimated 2-3 weeks   Last 3 [place] Suicide Severity Risk Score: Flowsheet Row [facility] (Current[facility] from [facility]14/2022 in [facility] from 10/09/2022 in [facility] EM[facility]GENCY [facility]  C-SSRS RISK CATEGORY No Risk No Risk High  Risk       Last PHQ 2/9 Scores:    11/27/2019    9:41 AM 08/04/2018    9:03 AM  Depression screen PHQ 2/9  Decreased Interest 0 0  Down, Depressed, Hopeless 0 0  PHQ - 2 Score 0 0    Scribe for Treatment Team: Aram Beecham, Theresia Majors 11/21/2022 11:35 AM

## 2022-11-22 DIAGNOSIS — F250 Schizoaffective disorder, bipolar type: Principal | ICD-10-CM

## 2022-11-22 LAB — VALPROIC ACID LEVEL: Valproic Acid Lvl: 94 ug/mL (ref 50.0–100.0)

## 2022-11-22 MED ORDER — HALOPERIDOL 5 MG PO TABS: 5.0000 mg | ORAL_TABLET | Freq: Two times a day (BID) | ORAL | 0 refills | Status: DC

## 2022-11-22 MED ORDER — PROPRANOLOL HCL 10 MG PO TABS: 10.0000 mg | ORAL_TABLET | Freq: Two times a day (BID) | ORAL | 0 refills | Status: DC

## 2022-11-22 MED ORDER — DIVALPROEX SODIUM 250 MG PO DR TAB: 750.0000 mg | DELAYED_RELEASE_TABLET | Freq: Two times a day (BID) | ORAL | 0 refills | Status: DC

## 2022-11-22 MED ORDER — LORAZEPAM 1 MG PO TABS: 1.0000 mg | ORAL_TABLET | Freq: Two times a day (BID) | ORAL | 0 refills | Status: AC

## 2022-11-22 NOTE — Progress Notes (Signed)
   11/22/22 0900  Psych Admission Type (Psych Patients Only)  Admission Status Involuntary  Psychosocial Assessment  Patient Complaints Anxiety  Eye Contact Fair  Facial Expression Animated;Anxious  Affect Preoccupied  Speech Pressured  Interaction Assertive  Motor Activity Pacing  Appearance/Hygiene Improved  Behavior Characteristics Cooperative  Mood Preoccupied  Aggressive Behavior  Effect No apparent injury  Thought Process  Coherency Disorganized;Circumstantial  Content Religiosity  Delusions Religious  Perception Hallucinations  Hallucination Auditory  Judgment Poor  Confusion None  Danger to Self  Current suicidal ideation? Denies  Agreement Not to Harm Self Yes  Description of Agreement Verbal  Danger to Others  Danger to Others None reported or observed

## 2022-11-22 NOTE — Progress Notes (Signed)
Recreation Therapy Notes  INPATIENT RECREATION TR PLAN  Patient Details Name: Mario Thomas MRN: [id number] DOB: 06-24-2001 Today's Date: 11/22/2022  Rec Therapy Plan Is patient appropriate for Therapeutic Recreation?: Yes Treatment times per week: about 3 days Estimated Length of Stay: 5-7 days TR Treatment/Interventions: Group participation (Comment)  Discharge Criteria Pt will be discharged from therapy if:: Discharged Treatment plan/goals/alternatives discussed and agreed upon by:: Patient/family  Discharge Summary Short term goals set: See patient care plan Short term goals met: Complete Progress toward goals comments: Groups attended Which groups?: Self-esteem, Goal setting, Other (Comment) (Problem Solving; Team Building) Reason goals not met: None Therapeutic equipment acquired: N/A Reason patient discharged from therapy: Discharge from hospital Pt/family agrees with progress & goals achieved: Yes Date patient discharged from therapy: 11/22/22   Brittni Hult-McCall, LRT,CTRS Charmagne Buhl A Kanetra Ho-McCall 11/22/2022, 11:45 AM

## 2022-11-22 NOTE — [facility] Group Notes (Signed)
[facility] Group Notes:  (Nursing/MHT/Case Management/Adjunct)  Date:  11/22/2022  Time:  8:38 AM  Type of Therapy:  Psychoeducational Skills  Participation Level:  Active  Participation Quality:  Appropriate and Attentive  Affect:  Appropriate  Cognitive:  Alert and Oriented  Insight:  Appropriate and Improving  Engagement in Group:  Engaged  Modes of Intervention:  Discussion  Summary of Progress/Problems:  Goals and orientation discussed.  Patient was appropriate and attentive.  Audrie Lia Helia Haese 11/22/2022, 8:38 AM

## 2022-11-22 NOTE — Group Note (Signed)
Recreation Therapy Group Note   Group Topic:Team Building  Group Date: 11/22/2022 Start Time: 1000 End Time: 1030 Facilitators: Chijioke Lasser-McCall, LRT,CTRS Location: [address] Dayroom   Goal Area(s) Addresses:  Patient will effectively work with peer towards shared goal.  Patient will identify skills used to make activity successful.  Patient will identify how skills used during activity can be used to reach post d/c goals.   Group Description: Straw Bridge. In teams of 3-5, patients were given 15 plastic drinking straws and an equal length of masking tape. Using the materials provided, patients were instructed to build a free standing bridge-like structure to suspend an everyday item (ex: puzzle box) off of the floor or table surface. All materials were required to be used by the team in their design. LRT facilitated post-activity discussion reviewing team process. Patients were encouraged to reflect how the skills used in this activity can be generalized to daily life post discharge.    Affect/Mood: Flat   Participation Level: Active   Participation Quality: Independent   Behavior: Appropriate   Speech/Thought Process: Focused   Insight: Good   Judgement: Good   Modes of Intervention: STEM Activity   Patient Response to Interventions:  Attentive   Education Outcome:  Acknowledges education and In group clarification offered    Clinical Observations/Individualized Feedback: Pt was quiet for majority of group.  Pt come up with the concept for his team.  Pt assisted peer by handing him the straws.  Pt was attentive to the activity and what needed to be done.  Pt didn't offer any answers during processing.    Plan: Continue to engage patient in RT group sessions 2-3x/week.   Yaa Donnellan-McCall, LRT,CTRS 11/22/2022 11:25 AM

## 2022-11-22 NOTE — Progress Notes (Signed)
Patient discharged. Reviewed discharge instructions with patient. Patient verbalized understanding. Patient received all personal belongings. Patient left unit at 1115.

## 2022-11-22 NOTE — Progress Notes (Signed)
  [facility] Adult Case Management Discharge Plan :  Will you be returning to the same living situation after discharge:  Yes,  at home with mother At discharge, do you have transportation home?: Yes,  mother will transport Do you have the ability to pay for your medications: Yes,  has insurance [employer] of information consent forms completed and in the chart;  Patient's signature needed at discharge.  Patient to Follow up at:  Follow-up Information     [facility] Follow up.   Why: You are connected to ACTT services through this agency.  Please continue to follow this organization and follow treatment recommendations. Contact information: [address] [place] [address] [phone]                 Next level of care provider has access to [facility] Link:no  Safety Planning and Suicide Prevention discussed: Yes,  mother     Has patient been referred to the Quitline?: Patient refused referral; does not use  Patient has been referred for addiction treatment: Pt. refused referral; does not use  Marinda Elk, LCSW 11/22/2022, 9:28 AM

## 2022-11-22 NOTE — Progress Notes (Signed)
The focus of this group is to help patients review their daily goal of treatment and discuss progress on daily workbooks. Pt did not attend the evening group. 

## 2022-11-22 NOTE — Discharge Summary (Signed)
Physician Discharge Summary Note  Patient:  Mario Thomas is an 21 y.o., male MRN:  [id number] DOB:  12/19/2000 Patient phone:  [phone] (home)  Patient address:   [address] [name] [facility] [address],  Total Time spent with patient: 15 minutes  Date of Admission:  11/15/2022 Date of Discharge: 11/22/22   Reason for Admission:    Mario Thomas is a 20 yo male with documented PPHx of Bipolar I disorder, schizophrenia, MDD, GAD, and previous IVCs who presents involuntarily under IVC for bizarre, erratic behavior in the setting of medication non-adherence.   Principal Problem: Schizoaffective disorder, bipolar type [facility]) Discharge Diagnoses: Principal Problem:   Schizoaffective disorder, bipolar type [facility])  Past Psychiatric History:  Past Psychiatric Hx: Previous Psych Diagnoses: Per chart review, there are past diagnoses of bipolar type I, GAD, MDD Prior inpatient treatment: Per chart review,  [facility] from 11/09/21 under IVC through 11/17/2021 for manic type behaviors, and from 06/11/22 through 06/20/22 for bizarre behaviors and making threats to his parents.   Current/prior outpatient treatment: Unable to assess Prior rehab hx: unable to assess  Psychotherapy hx: unable to asses  History of suicide: Denies History of homicide or aggression: Denies Psychiatric medication history: LAI with Risperdal Concerta 10/03/2022 25 mg IM initiated during [facility] admission at that time Psychiatric medication compliance history: Patient has history of medication noncompliance, unable to assess the reasons behind this. Current Psychiatrist: Unable to assess Current therapist: Unable to assess   Substance Abuse Hx: Alcohol: Patient endorses drinking alcohol when he was in college, had a period of binge drinking a couple of months ago.  Otherwise denies chronic use of alcohol. Tobacco: Denies, says "I wish" Illicit drugs: Unable to assess, patient says he likes inhaling several things, one of which includes  his sister's panties. Rx drug abuse: Unable to assess Rehab hx: No rehab history found in chart review  Past Medical History:  Past Medical History:  Diagnosis Date   Heart murmur    MDD (major depressive disorder), severe (HCC) 08/23/2018    History reviewed. No pertinent surgical history. Family History:  Family History  Problem Relation Age of Onset   Hyperlipidemia Father    Family Psychiatric History: Psych: Reports and month who committed suicide.  Otherwise denies any psychiatric history in his family Psych Rx: Denies SA/HA: Denies Substance use family hx: Denies Social History:  Social History   Substance and Sexual Activity  Alcohol Use Not Currently     Social History   Substance and Sexual Activity  Drug Use Never   Comment: Denies    Social History   Socioeconomic History   Marital status: Single    Spouse name: Not on file   Number of children: Not on file   Years of education: Not on file   Highest education level: Not on file  Occupational History   Not on file  Tobacco Use   Smoking status: Never   Smokeless tobacco: Never  Vaping Use   Vaping Use: Never used  Substance and Sexual Activity   Alcohol use: Not Currently   Drug use: Never    Comment: Denies   Sexual activity: Never  Other Topics Concern   Not on file  Social History Narrative   01/23/21   From: the area   Living: with parents   Work: Actor   School: Actuary at [employer]: good relationship with parents, 1 sibling      Enjoys: running  Exercise: daily running    Diet: meat, veggies, grains      Safety   Seat belts: Yes    Guns: No   Safe in relationships: Yes    Social Determinants of Health   Financial Resource Strain: Not on file  Food Insecurity: No Food Insecurity (11/15/2022)   Hunger Vital Sign    Worried About Running Out of Food in the Last Year: Never true    Ran Out of Food in the Last Year: Never true  Transportation  Needs: No Transportation Needs (11/15/2022)   PRAPARE - Administrator, Civil ServiceTransportation    Lack of Transportation (Medical): No    Lack of Transportation (Non-Medical): No  Physical Activity: Not on file  Stress: Not on file  Social Connections: Not on file    Hospital Course:   During the patient's hospitalization, patient had extensive initial psychiatric evaluation, and follow-up psychiatric evaluations every day.   Psychiatric diagnoses provided upon initial assessment:  Schizoaffective Disorder, Bipolar Type    Patient's psychiatric medications were adjusted on admission:  Started Risperdal 1 mg daily and 2 mg nightly on admission, which was later titrated up to 4 mg BID, and was later discontinued due to poor response On Risperdal patient continued to exhibit inappropriate, intrusive behavior. He engaged in self-dialogue nearly non-stop. He remains hyper-religious and delusional, and is fixated on Hell.  A cross taper was planned as below Risperdal 4 mg BID: Sunday 12/10        Risperdal 3 mg BID ; Haldol 5 mg daily + Depakote 500 mg  as tolerated Monday 12/11            Risperdal 2 mg BID ; Haldol 5 mg daily + Depakote increased to 750  mg  as tolerated Tuesday 12/12           Risperdal 1 mg BID ; Haldol 5 mg daily + Depakote 750 mg  as tolerated Started Atarax 25 mg as needed on admission for anxiety   During the hospitalization, other adjustments were made to the patient's psychiatric medication regimen:  Started Cogentin 1 mg twice daily for EPS prophylaxis Started Propanolol 10 mg Q12HR for tachycardia and anxiety Started Ativan 1 mg Q12Hr for restlessness   Patient's care was discussed during the interdisciplinary team meeting every day during the hospitalization.   The patient denies having any side effects to prescribed psychiatric medication.   Gradually, patient started adjusting to milieu. The patient was evaluated each day by a clinical provider to ascertain response to treatment.  Improvement was noted by the patient's report of decreasing symptoms, improved sleep and appetite, affect, medication tolerance, behavior, and participation in unit programming.  Patient was asked each day to complete a self inventory noting mood, mental status, pain, new symptoms, anxiety and concerns.     Symptoms were reported as significantly decreased or resolved completely by discharge.    On day of discharge, the patient reports that their mood is stable. The patient denied having suicidal thoughts for more than 48 hours prior to discharge.  Patient denies having homicidal thoughts.  Patient denies having auditory hallucinations.  Patient denies any visual hallucinations or other symptoms of psychosis. The patient was motivated to continue taking medication with a goal of continued improvement in mental health.    The patient reports their target psychiatric symptoms of psychosis, agitation, and homicidal ideation all responded well to the psychiatric medications, and the patient reports overall benefit other psychiatric hospitalization. Supportive psychotherapy was provided to the  patient. The patient also participated in regular group therapy while hospitalized. Coping skills, problem solving as well as relaxation therapies were also part of the unit programming.   Throughout the patient's hospitalization, patient commented to staff and treatment team that his mother would convince patient to stop taking his medication says "when I am at home my mother tells me that I do not need the medicine anymore as I do well". Discussed with patient importance to comply with medication to avoid rehospitalization, also discussed with patient recommendation to start Haldol LAI but he declined. On several conversations with mother, she shares that she is distrustful of the medications he is taking. She feels that her son is being "experimented on" and does not see the need for medication. She also expresses  interest in applying for legal guardianship. There were extensive conversation with patient's mother regarding the importance of medication adherence and consistent follow up with outpatient providers. There is concern for mother's poor insight into his diagnosis and does not think that medication is necessary. Patient does not present with any restlessness anxiety or akathisia, denies symptoms and does not present with any signs consistent with EPS or TD during hospitalization and on the day of discharge.    Labs were reviewed with the patient, and abnormal results were discussed with the patient.   The patient is able to verbalize their individual safety plan to this provider.   # It is recommended to the patient to continue psychiatric medications as prescribed, after discharge from the hospital.     # It is recommended to the patient to follow up with your outpatient psychiatric provider and PCP.   # It was discussed with the patient, the impact of alcohol, drugs, tobacco have been there overall psychiatric and medical wellbeing, and total abstinence from substance use was recommended the patient.ed.   # Prescriptions provided or sent directly to preferred pharmacy at discharge. Patient agreeable to plan. Given opportunity to ask questions. Appears to feel comfortable with discharge.    # In the event of worsening symptoms, the patient is instructed to call the crisis hotline, 911 and or go to the nearest ED for appropriate evaluation and treatment of symptoms. To follow-up with primary care provider for other medical issues, concerns and or health care needs   # Patient was discharged home on 11/22/22 with a plan to follow up as noted below.   Physical Findings: AIMS: Facial and Oral Movements Muscles of Facial Expression: None, normal Lips and Perioral Area: None, normal Jaw: None, normal Tongue: None, normal,Extremity Movements Upper (arms, wrists, hands, fingers): None, normal Lower  (legs, knees, ankles, toes): None, normal, Trunk Movements Neck, shoulders, hips: None, normal, Overall Severity Severity of abnormal movements (highest score from questions above): None, normal Incapacitation due to abnormal movements: None, normal Patient's awareness of abnormal movements (rate only patient's report): No Awareness, Dental Status Current problems with teeth and/or dentures?: No Does patient usually wear dentures?: No  CIWA:    COWS:    Musculoskeletal: Strength & Muscle Tone: within normal limits Gait & Station: normal Patient leans: N/A   Psychiatric Specialty Exam:   Presentation  General Appearance: Appropriate for environment; fairly groomed Eye Contact: Good Speech: Clear and coherent Speech Volume: Normal Handedness: Right   Mood and Affect  Mood: "Better" Affect: Flat   Thought Process  Thought Processes: Organized; coherent; linear Descriptions of Associations: Intact Orientation: Full (time, place and person) Thought Content: Logical History of Schizophrenia/Schizoaffective disorder: Yes Duration of  Psychotic Symptoms: Less than 6 months Hallucinations: Denies Ideas of Reference: Denies Suicidal Thoughts: Denies Homicidal Thoughts: Denies   Sensorium  Memory: Immediate, recent, and remote improved Judgment: Good Insight: Good   Executive Functions  Concentration: Good Attention Span: Good Recall: Good Fund of Knowledge: Fair Language: Good   Psychomotor Activity  Psychomotor Activity: Normal; no restlessness noted on exam   Assets  Assets: Communication skills; desire for improvement; housing   Sleep  Sleep: Good     Physical Exam: Physical Exam Constitutional:      General: He is not in acute distress.    Appearance: Normal appearance. He is normal weight. He is not ill-appearing.  Pulmonary:     Effort: Pulmonary effort is normal. No respiratory distress.  Musculoskeletal:        General: Normal range of motion.      Cervical back: Normal range of motion.  Skin:    General: Skin is warm and dry.  Neurological:     General: No focal deficit present.     Mental Status: He is alert and oriented to person, place, and time.  Psychiatric:        Mood and Affect: Mood normal.        Behavior: Behavior normal.        Thought Content: Thought content normal.        Judgment: Judgment normal.    Review of Systems  Constitutional:  Negative for chills and fever.  Respiratory:  Negative for shortness of breath.   Cardiovascular:  Negative for chest pain.  Gastrointestinal:  Negative for abdominal pain, constipation and diarrhea.  Musculoskeletal:  Negative for myalgias.  Neurological:  Negative for sensory change.  Psychiatric/Behavioral:  Negative for depression, hallucinations, memory loss, substance abuse and suicidal ideas. The patient is not nervous/anxious and does not have insomnia.     Blood pressure 124/81, pulse 97, temperature 97.8 F (36.6 C), temperature source Oral, resp. rate 14, height 5\' 10"  (1.778 m), weight 97.5 kg, SpO2 99 %. Body mass index is 30.85 kg/m.     Social History   Tobacco Use  Smoking Status Never  Smokeless Tobacco Never   Tobacco Cessation:  N/A, patient does not currently use tobacco products   Blood Alcohol level:  Lab Results  Component Value Date   ETH <10 11/14/2022   ETH <10 10/09/2022    Metabolic Disorder Labs:  Lab Results  Component Value Date   HGBA1C 4.8 09/25/2022   MPG 91.06 09/25/2022   MPG 93.93 05/28/2022   Lab Results  Component Value Date   PROLACTIN 10.0 08/24/2018   Lab Results  Component Value Date   CHOL 122 09/25/2022   TRIG 27 09/25/2022   HDL 42 09/25/2022   CHOLHDL 2.9 09/25/2022   VLDL 5 09/25/2022   LDLCALC 75 09/25/2022   LDLCALC 41 05/28/2022    See Psychiatric Specialty Exam and Suicide Risk Assessment completed by Attending Physician prior to discharge.  Discharge destination:  Home  Is patient on multiple  antipsychotic therapies at discharge:  No   Has Patient had three or more failed trials of antipsychotic monotherapy by history:  No  Recommended Plan for Multiple Antipsychotic Therapies: NA  Discharge Instructions     Diet - low sodium heart healthy   Complete by: As directed    Increase activity slowly   Complete by: As directed       Allergies as of 11/22/2022   No Known Allergies  Medication List     STOP taking these medications    risperiDONE 4 MG disintegrating tablet Commonly known as: RISPERDAL M-TABS   risperiDONE microspheres 25 MG injection Commonly known as: RISPERDAL CONSTA       TAKE these medications      Indication  benztropine 1 MG tablet Commonly known as: COGENTIN Take 1 tablet (1 mg total) by mouth 2 (two) times daily.  Indication: Extrapyramidal Reaction caused by Medications   divalproex 250 MG DR tablet Commonly known as: DEPAKOTE Take 3 tablets (750 mg total) by mouth every 12 (twelve) hours.  Indication: Schizophrenia   haloperidol 5 MG tablet Commonly known as: HALDOL Take 1 tablet (5 mg total) by mouth every 12 (twelve) hours.  Indication: Psychosis   hydrOXYzine 25 MG tablet Commonly known as: ATARAX Take 1 tablet (25 mg total) by mouth 3 (three) times daily as needed for anxiety.  Indication: Feeling Anxious   LORazepam 1 MG tablet Commonly known as: ATIVAN Take 1 tablet (1 mg total) by mouth every 12 (twelve) hours.  Indication: restlessness   propranolol 10 MG tablet Commonly known as: INDERAL Take 1 tablet (10 mg total) by mouth every 12 (twelve) hours. What changed: when to take this  Indication: Feeling Anxious, tachycardia and anxiety   traZODone 50 MG tablet Commonly known as: DESYREL Take 1 tablet (50 mg total) by mouth at bedtime.  Indication: Trouble Sleeping         Follow-up [facility] Follow up.   Why: You are connected to ACTT services through this  agency.  Please continue to follow this organization and follow treatment recommendations. Contact information: [address] [place] [address] [phone]                 Plan Of Care/Follow-up recommendations:  Activity: as tolerated   Diet: heart healthy   Other: -Follow-up with your outpatient psychiatric provider -instructions on appointment date, time, and address (location) are provided to you in discharge paperwork.   -Take your psychiatric medications as prescribed at discharge - instructions are provided to you in the discharge paperwork   -Follow-up with outpatient primary care doctor and other specialists -for management of preventative medicine and chronic medical disease, including:  Taper off Ativan  Repeat Depakote level (94 ug/mL on 11/21/2022)   -Testing: Follow-up with outpatient provider for abnormal lab results:    -Recommend abstinence from alcohol, tobacco, and other illicit drug use at discharge.    -If your psychiatric symptoms recur, worsen, or if you have side effects to your psychiatric medications, call your outpatient psychiatric provider, 911, [phone] or go to the nearest emergency department.   -If suicidal thoughts recur, call your outpatient psychiatric provider, 911, [phone] or go to the nearest emergency department.   Signed: Lorri Frederick, MD 11/22/2022, 11:01 AM

## 2022-11-22 NOTE — Plan of Care (Signed)
Patient was able to focus on task/topic with less than two prompts from staff within five recreation therapy group sessions.   Tad Fancher-McCall, LRT,CTRS

## 2022-11-22 NOTE — Progress Notes (Signed)
   11/22/22 0500  Sleep  Number of Hours 8.5

## 2022-11-22 NOTE — [facility] Suicide Risk Assessment (Addendum)
Suicide Risk Assessment  Discharge Assessment    [facility] Discharge Suicide Risk Assessment   Principal Problem: Schizoaffective disorder, bipolar type [facility]) Discharge Diagnoses: Principal Problem:   Schizoaffective disorder, bipolar type (HCC)   Total Time spent with patient: 15 minutes  Mario Thomas is a 21 yo male with documented PPHx of Bipolar I disorder, schizophrenia, MDD, GAD, and previous IVCs who presents involuntarily under IVC for bizarre, erratic behavior in the setting of medication non-adherence.  During the patient's hospitalization, patient had extensive initial psychiatric evaluation, and follow-up psychiatric evaluations every day.  Psychiatric diagnoses provided upon initial assessment:  Schizoaffective Disorder, Bipolar Type   Patient's psychiatric medications were adjusted on admission:  Started Risperdal 1 mg daily and 2 mg nightly on admission, which was later titrated up to 4 mg BID, and was later discontinued due to poor response On Risperdal patient continued to exhibit inappropriate, intrusive behavior. He engaged in self-dialogue nearly non-stop. He remains hyper-religious and delusional, and is fixated on Hell.  A cross taper was planned as below Risperdal 4 mg BID: Sunday 12/10  Risperdal 3 mg BID ; Haldol 5 mg daily + Depakote 500 mg  as tolerated Monday 12/11            Risperdal 2 mg BID ; Haldol 5 mg daily + Depakote increased to 750  mg  as tolerated Tuesday 12/12           Risperdal 1 mg BID ; Haldol 5 mg daily + Depakote 750 mg  as tolerated Started Atarax 25 mg as needed on admission for anxiety  During the hospitalization, other adjustments were made to the patient's psychiatric medication regimen:  Started Cogentin 1 mg twice daily for EPS prophylaxis Started Propanolol 10 mg Q12HR for tachycardia and anxiety Started Ativan 1 mg Q12Hr for restlessness  Patient's care was discussed during the interdisciplinary team meeting every day during the  hospitalization.  The patient denies having any side effects to prescribed psychiatric medication.  Gradually, patient started adjusting to milieu. The patient was evaluated each day by a clinical provider to ascertain response to treatment. Improvement was noted by the patient's report of decreasing symptoms, improved sleep and appetite, affect, medication tolerance, behavior, and participation in unit programming.  Patient was asked each day to complete a self inventory noting mood, mental status, pain, new symptoms, anxiety and concerns.    Symptoms were reported as significantly decreased or resolved completely by discharge.   On day of discharge, the patient reports that their mood is stable. The patient denied having suicidal thoughts for more than 48 hours prior to discharge.  Patient denies having homicidal thoughts.  Patient denies having auditory hallucinations.  Patient denies any visual hallucinations or other symptoms of psychosis. The patient was motivated to continue taking medication with a goal of continued improvement in mental health.   The patient reports their target psychiatric symptoms of psychosis, agitation, and homicidal ideation all responded well to the psychiatric medications, and the patient reports overall benefit other psychiatric hospitalization. Supportive psychotherapy was provided to the patient. The patient also participated in regular group therapy while hospitalized. Coping skills, problem solving as well as relaxation therapies were also part of the unit programming.  Throughout the patient's hospitalization, patient commented to staff and treatment team that his mother would convince patient to stop taking his medication says "when I am at home my mother tells me that I do not need the medicine anymore as I do well". Discussed with patient  importance to comply with medication to avoid rehospitalization, also discussed with patient recommendation to start Haldol  LAI but he declined. On several conversations with mother, she shares that she is distrustful of the medications he is taking. She feels that her son is being "experimented on" and does not see the need for medication. She also expresses interest in applying for legal guardianship. There were extensive conversation with patient's mother regarding the importance of medication adherence and consistent follow up with outpatient providers. There is concern for mother's poor insight into his diagnosis and does not think that medication is necessary. Patient does not present with any restlessness anxiety or akathisia, denies symptoms and does not present with any signs consistent with EPS or TD during hospitalization and on the day of discharge.   Labs were reviewed with the patient, and abnormal results were discussed with the patient.  The patient is able to verbalize their individual safety plan to this provider.  # It is recommended to the patient to continue psychiatric medications as prescribed, after discharge from the hospital.    # It is recommended to the patient to follow up with your outpatient psychiatric provider and PCP.  # It was discussed with the patient, the impact of alcohol, drugs, tobacco have been there overall psychiatric and medical wellbeing, and total abstinence from substance use was recommended the patient.ed.  # Prescriptions provided or sent directly to preferred pharmacy at discharge. Patient agreeable to plan. Given opportunity to ask questions. Appears to feel comfortable with discharge.    # In the event of worsening symptoms, the patient is instructed to call the crisis hotline, 911 and or go to the nearest ED for appropriate evaluation and treatment of symptoms. To follow-up with primary care provider for other medical issues, concerns and or health care needs  # Patient was discharged home on 11/22/22 with a plan to follow up as noted below.     Musculoskeletal: Strength & Muscle Tone: within normal limits Gait & Station: normal Patient leans: N/A  Psychiatric Specialty Exam:   Presentation  General Appearance: Appropriate for environment; fairly groomed Eye Contact: Good Speech: Clear and coherent Speech Volume: Normal Handedness: Right   Mood and Affect  Mood: "Better" Affect: Flat   Thought Process  Thought Processes: Organized; coherent; linear Descriptions of Associations: Intact Orientation: Full (time, place and person) Thought Content: Logical History of Schizophrenia/Schizoaffective disorder: Yes Duration of Psychotic Symptoms: Less than 6 months Hallucinations: Denies Ideas of Reference: Denies Suicidal Thoughts: Denies Homicidal Thoughts: Denies   Sensorium  Memory: Immediate, recent, and remote improved Judgment: Good Insight: Good   Executive Functions  Concentration: Good Attention Span: Good Recall: Good Fund of Knowledge: Fair Language: Good   Psychomotor Activity  Psychomotor Activity: Normal; no restlessness noted on exam   Assets  Assets: Communication skills; desire for improvement; housing   Sleep  Sleep: Good     Physical Exam: Physical Exam Constitutional:      General: He is not in acute distress.    Appearance: Normal appearance. He is normal weight. He is not ill-appearing.  Pulmonary:     Effort: Pulmonary effort is normal. No respiratory distress.  Musculoskeletal:        General: Normal range of motion.     Cervical back: Normal range of motion.  Skin:    General: Skin is warm and dry.  Neurological:     General: No focal deficit present.     Mental Status: He is alert and  oriented to person, place, and time.  Psychiatric:        Mood and Affect: Mood normal.        Behavior: Behavior normal.        Thought Content: Thought content normal.        Judgment: Judgment normal.    Review of Systems  Constitutional:  Negative for chills and fever.   Respiratory:  Negative for shortness of breath.   Cardiovascular:  Negative for chest pain.  Gastrointestinal:  Negative for abdominal pain, constipation and diarrhea.  Musculoskeletal:  Negative for myalgias.  Neurological:  Negative for sensory change.  Psychiatric/Behavioral:  Negative for depression, hallucinations, memory loss, substance abuse and suicidal ideas. The patient is not nervous/anxious and does not have insomnia.    Blood pressure 124/81, pulse 97, temperature 97.8 F (36.6 C), temperature source Oral, resp. rate 14, height 5\' 10"  (1.778 m), weight 97.5 kg, SpO2 99 %. Body mass index is 30.85 kg/m.    Mental Status Per Nursing Assessment::   On Admission:  NA Demographic factors:  Male, Adolescent or young adult Current Mental Status:  NA Loss Factors:  NA Historical Factors:  NA Risk Reduction Factors:  Living with another person, especially a relative, Positive social support, Sense of responsibility to family   Continued Clinical Symptoms:  Unstable or Poor Therapeutic Relationship Previous Psychiatric Diagnoses and Treatments  Cognitive Features That Contribute To Risk:  None    Suicide Risk:  Mild: There are no identifiable suicide plans, no associated intent, mild dysphoria and related symptoms, good self-control (both objective and subjective assessment), few other risk factors, and identifiable protective factors, including available and accessible social support.    Follow-up Information     [facility] Follow up.   Why: You are connected to ACTT services through this agency.  Please continue to follow this organization and follow treatment recommendations. Contact information: [address] [phone] [place] [phone]                 Plan Of Care/Follow-up recommendations:  Activity: as tolerated  Diet: heart healthy  Other: -Follow-up with your outpatient psychiatric provider -instructions on  appointment date, time, and address (location) are provided to you in discharge paperwork.  -Take your psychiatric medications as prescribed at discharge - instructions are provided to you in the discharge paperwork  -Follow-up with outpatient primary care doctor and other specialists -for management of preventative medicine and chronic medical disease, including:  Taper off Ativan  Repeat Depakote level (94 ug/mL on 11/21/2022)  -Testing: Follow-up with outpatient provider for abnormal lab results:   -Recommend abstinence from alcohol, tobacco, and other illicit drug use at discharge.   -If your psychiatric symptoms recur, worsen, or if you have side effects to your psychiatric medications, call your outpatient psychiatric provider, 911, [phone] or go to the nearest emergency department.  -If suicidal thoughts recur, call your outpatient psychiatric provider, 911, [phone] or go to the nearest emergency department.     11/23/2022, MD 11/22/2022, 8:29 AM

## 2023-02-20 ENCOUNTER — Encounter: Payer: Self-pay | Admitting: Nurse Practitioner

## 2023-05-09 ENCOUNTER — Telehealth: Payer: Self-pay

## 2023-05-09 NOTE — Telephone Encounter (Signed)
Per access note pt agreed to go to ED.sending note to Audria Nine NP who pt has TOC appt scheduled on 08/02/23.

## 2023-05-09 NOTE — Telephone Encounter (Signed)
FYI: This call has been transferred to Access Nurse. Once the result note has been entered staff can address the message at that time.  Patient called in with the following symptoms:  Red Word:chest pain and sob,ongoing and constant    Please advise at Mobile [phone]  Message is routed to Provider Pool [facility] Triage  ** Sent to provider who patient has TOC set up with

## 2023-05-10 NOTE — Telephone Encounter (Signed)
I spoke with pt; pt said he did not go to ED on 05/09/23 because he said he was not having CP or SOB when he called. Pt said 4 - 5 days ago while walking is when pt had dull CP; pt said he did not know where he hurt in his chest. No radiation of CP anywhere. Pt cannot take BP now but 2 days ago BP 120/90 P?. Pt said he had never had CP before the other day. Pt denies CP and SOB now.Pt said he does not think is serious and does not want to go to UC or ED now. Pt said he already has appt forTOC with Audria Nine NP on 08/02/23 at 11 AM. UC & ED precautions were given and pt voiced understanding and pt said if had CP or SOB again he would go get checked out. Sending note to Audria Nine NP.

## 2023-05-10 NOTE — Telephone Encounter (Signed)
Agree with disposition but do not see where he went to ED in the chart

## 2023-05-10 NOTE — Telephone Encounter (Signed)
Unable to reach pt and left v/m requesting cb [phone].sending note to [facility] triage.

## 2023-05-12 NOTE — Telephone Encounter (Signed)
noted 

## 2023-05-17 ENCOUNTER — Emergency Department (HOSPITAL_COMMUNITY)
Admission: EM | Admit: 2023-05-17 | Discharge: 2023-05-18 | Disposition: A | Discharge status: Home / Self Care | Payer: Federal, State, Local not specified - PPO | Attending: Emergency Medicine | Admitting: Emergency Medicine

## 2023-05-17 ENCOUNTER — Emergency Department (HOSPITAL_COMMUNITY): Payer: Federal, State, Local not specified - PPO

## 2023-05-17 ENCOUNTER — Other Ambulatory Visit: Payer: Self-pay

## 2023-05-17 DIAGNOSIS — R0781 Pleurodynia: Secondary | ICD-10-CM

## 2023-05-17 DIAGNOSIS — R079 Chest pain, unspecified: Secondary | ICD-10-CM | POA: Diagnosis not present

## 2023-05-17 DIAGNOSIS — R0602 Shortness of breath: Secondary | ICD-10-CM | POA: Diagnosis not present

## 2023-05-17 LAB — CBC
HCT: 46.6 % (ref 39.0–52.0)
Hemoglobin: 15.4 g/dL (ref 13.0–17.0)
MCH: 29.8 pg (ref 26.0–34.0)
MCHC: 33 g/dL (ref 30.0–36.0)
MCV: 90.3 fL (ref 80.0–100.0)
Platelets: 193 10*3/uL (ref 150–400)
RBC: 5.16 MIL/uL (ref 4.22–5.81)
RDW: 12.4 % (ref 11.5–15.5)
WBC: 7.4 10*3/uL (ref 4.0–10.5)
nRBC: 0 % (ref 0.0–0.2)

## 2023-05-17 LAB — BASIC METABOLIC PANEL
Anion gap: 10 (ref 5–15)
BUN: 12 mg/dL (ref 6–20)
CO2: 26 mmol/L (ref 22–32)
Calcium: 9.7 mg/dL (ref 8.9–10.3)
Chloride: 103 mmol/L (ref 98–111)
Creatinine, Ser: 0.89 mg/dL (ref 0.61–1.24)
GFR, Estimated: >60 mL/min (ref >60)
Glucose, Bld: 98 mg/dL (ref 70–99)
Potassium: 4.3 mmol/L (ref 3.5–5.1)
Sodium: 139 mmol/L (ref 135–145)

## 2023-05-17 LAB — TROPONIN I (HIGH SENSITIVITY): Troponin I (High Sensitivity): <2 ng/L (ref <18)

## 2023-05-17 NOTE — ED Triage Notes (Signed)
Patient reports chronic  pain across chest for several years worse these past several weeks with mild SOB , no emesis or diaphoresis , denies fever or chills .

## 2023-05-18 LAB — TROPONIN I (HIGH SENSITIVITY): Troponin I (High Sensitivity): 3 ng/L (ref <18)

## 2023-05-18 LAB — D-DIMER, QUANTITATIVE: D-Dimer, Quant: <0.27 ug/mL-FEU (ref 0.00–0.50)

## 2023-05-18 MED ORDER — NAPROXEN 500 MG PO TABS: 500.0000 mg | ORAL_TABLET | Freq: Two times a day (BID) | ORAL | 0 refills | Status: DC

## 2023-05-18 NOTE — ED Provider Notes (Signed)
[facility]ENC[facility] DEPT [facility] Emergency Department Provider Note MRN:  [id number]  Arrival date & time: 05/18/23     Chief Complaint   Chest Pain   History of Present Illness   Mario Thomas is a 22 y.o. year-old male presents to the ED with chief complaint of bilateral lower rib pain for the past 3 years.  He states that his symptoms worsen over the past few days and he had some central chest pains as well.  He denies history of PE or DVT.  Denies fevers, chills, cough.  He states that his symptoms are worsened when he takes deep breath and it causes him to feel short of breath.  Denies any successful treatments prior to arrival.  History provided by patient.   Review of Systems  Pertinent positive and negative review of systems noted in HPI.    Physical Exam   Vitals:   05/18/23 0330 05/18/23 0345  BP: (!) 154/89 (!) 141/82  Pulse: 66 63  Resp: 16 18  Temp:    SpO2: 100% 100%    CONSTITUTIONAL:  well-appearing, NAD NEURO:  Alert and oriented x 3, CN 3-12 grossly intact EYES:  eyes equal and reactive ENT/NECK:  Supple, no stridor  CARDIO:  normal rate, regular rhythm, appears well-perfused  PULM:  No respiratory distress, CTAB GI/GU:  non-distended,  MSK/SPINE:  No gross deformities, no edema, moves all extremities  SKIN:  no rash, atraumatic   *Additional and/or pertinent findings included in MDM below  Diagnostic and Interventional Summary    EKG Interpretation  Date/Time:    Ventricular Rate:    PR Interval:    QRS Duration:   QT Interval:    QTC Calculation:   R Axis:     Text Interpretation:         Labs Reviewed  BASIC METABOLIC PANEL  CBC  D-DIMER, QUANTITATIVE  TROPONIN I (HIGH SENSITIVITY)  TROPONIN I (HIGH SENSITIVITY)    DG Chest 2 View  Final Result      Medications - No data to display   Procedures  /  Critical Care Procedures  ED Course and Medical Decision Making  I have reviewed the triage vital signs, the nursing notes,  and pertinent available records from the EMR.  Social Determinants Affecting Complexity of Care: Patient has no clinically significant social determinants affecting this chief complaint..   ED Course:    Medical Decision Making Patient here with chest pains x 3 years.  States that pains are mostly in the lower ribs bilaterally.  Symptoms are worsened with deep breathing.  Laboratory workup in triage is reassuring.  He is low risk for ACS.  Chest x-ray is negative.  I will add on a D-dimer due to the pleuritic nature of the pain.  Consider pleurisy.  D-dimer is negative.  Will have patient follow-up with his PCP.  Amount and/or Complexity of Data Reviewed Labs: ordered.    Details: Trops are negative, doubt ACS D-dimer negative, doubt PE  Radiology: ordered and independent interpretation performed.    Details: CXR shows now signe of pneumonia ECG/medicine tests: ordered and independent interpretation performed.    Details: NSR     Consultants: No consultations were needed in caring for this patient.   Treatment and Plan: Emergency department workup does not suggest an emergent condition requiring admission or immediate intervention beyond  what has been performed at this time. The patient is safe for discharge and has  been instructed to return immediately for worsening symptoms,  change in  symptoms or any other concerns    Final Clinical Impressions(s) / ED Diagnoses     ICD-10-CM   1. Pleuritic pain  R07.81       ED Discharge Orders          Ordered    naproxen (NAPROSYN) 500 MG tablet  2 times daily        05/18/23 0503              Discharge Instructions Discussed with and Provided to Patient:   Discharge Instructions   None      Roxy Horseman, PA-C 05/18/23 1610    Palumbo, April, MD 05/18/23 [phone]

## 2023-07-10 ENCOUNTER — Encounter (INDEPENDENT_AMBULATORY_CARE_PROVIDER_SITE_OTHER): Payer: Self-pay

## 2023-08-02 ENCOUNTER — Encounter: Payer: Self-pay | Admitting: Nurse Practitioner

## 2023-10-15 ENCOUNTER — Ambulatory Visit (HOSPITAL_COMMUNITY)
Admission: EM | Admit: 2023-10-15 | Discharge: 2023-10-15 | Disposition: A | Discharge status: Other Acute Inpatient Hospital | Payer: BC Managed Care – PPO | Attending: Nurse Practitioner | Admitting: Nurse Practitioner

## 2023-10-15 DIAGNOSIS — R45851 Suicidal ideations: Secondary | ICD-10-CM | POA: Diagnosis not present

## 2023-10-15 DIAGNOSIS — G2581 Restless legs syndrome: Secondary | ICD-10-CM | POA: Diagnosis not present

## 2023-10-15 DIAGNOSIS — F22 Delusional disorders: Secondary | ICD-10-CM | POA: Diagnosis not present

## 2023-10-15 DIAGNOSIS — Z79899 Other long term (current) drug therapy: Secondary | ICD-10-CM | POA: Diagnosis not present

## 2023-10-15 DIAGNOSIS — F411 Generalized anxiety disorder: Secondary | ICD-10-CM | POA: Diagnosis not present

## 2023-10-15 DIAGNOSIS — F250 Schizoaffective disorder, bipolar type: Secondary | ICD-10-CM | POA: Insufficient documentation

## 2023-10-15 DIAGNOSIS — K589 Irritable bowel syndrome without diarrhea: Secondary | ICD-10-CM | POA: Diagnosis not present

## 2023-10-15 LAB — LIPID PANEL
Cholesterol: 125 mg/dL (ref 0–200)
HDL: 47 mg/dL (ref >40)
LDL Cholesterol: 67 mg/dL (ref 0–99)
Total CHOL/HDL Ratio: 2.7 {ratio}
Triglycerides: 56 mg/dL (ref <150)
VLDL: 11 mg/dL (ref 0–40)

## 2023-10-15 LAB — COMPREHENSIVE METABOLIC PANEL
ALT: 16 U/L (ref 0–44)
AST: 18 U/L (ref 15–41)
Albumin: 4.3 g/dL (ref 3.5–5.0)
Alkaline Phosphatase: 90 U/L (ref 38–126)
Anion gap: 11 (ref 5–15)
BUN: 14 mg/dL (ref 6–20)
CO2: 27 mmol/L (ref 22–32)
Calcium: 9.7 mg/dL (ref 8.9–10.3)
Chloride: 101 mmol/L (ref 98–111)
Creatinine, Ser: 0.63 mg/dL (ref 0.61–1.24)
GFR, Estimated: >60 mL/min (ref >60)
Glucose, Bld: 79 mg/dL (ref 70–99)
Potassium: 4.1 mmol/L (ref 3.5–5.1)
Sodium: 139 mmol/L (ref 135–145)
Total Bilirubin: 0.6 mg/dL (ref <1.2)
Total Protein: 6.9 g/dL (ref 6.5–8.1)

## 2023-10-15 LAB — CBC WITH DIFFERENTIAL/PLATELET
Abs Immature Granulocytes: 0.02 10*3/uL (ref 0.00–0.07)
Basophils Absolute: 0 10*3/uL (ref 0.0–0.1)
Basophils Relative: 1 %
Eosinophils Absolute: 0.2 10*3/uL (ref 0.0–0.5)
Eosinophils Relative: 3 %
HCT: 47.5 % (ref 39.0–52.0)
Hemoglobin: 15.6 g/dL (ref 13.0–17.0)
Immature Granulocytes: 0 %
Lymphocytes Relative: 30 %
Lymphs Abs: 2.1 10*3/uL (ref 0.7–4.0)
MCH: 29.5 pg (ref 26.0–34.0)
MCHC: 32.8 g/dL (ref 30.0–36.0)
MCV: 89.8 fL (ref 80.0–100.0)
Monocytes Absolute: 0.6 10*3/uL (ref 0.1–1.0)
Monocytes Relative: 8 %
Neutro Abs: 4.1 10*3/uL (ref 1.7–7.7)
Neutrophils Relative %: 58 %
Platelets: 211 10*3/uL (ref 150–400)
RBC: 5.29 MIL/uL (ref 4.22–5.81)
RDW: 13.8 % (ref 11.5–15.5)
WBC: 7 10*3/uL (ref 4.0–10.5)
nRBC: 0 % (ref 0.0–0.2)

## 2023-10-15 LAB — POCT URINE DRUG SCREEN - MANUAL ENTRY (I-SCREEN)
POC Amphetamine UR: NOT DETECTED
POC Buprenorphine (BUP): NOT DETECTED
POC Cocaine UR: NOT DETECTED
POC Marijuana UR: NOT DETECTED
POC Methadone UR: NOT DETECTED
POC Methamphetamine UR: NOT DETECTED
POC Morphine: NOT DETECTED
POC Oxazepam (BZO): NOT DETECTED
POC Oxycodone UR: NOT DETECTED
POC Secobarbital (BAR): NOT DETECTED

## 2023-10-15 LAB — HEMOGLOBIN A1C
Hgb A1c MFr Bld: 4.8 % (ref 4.8–5.6)
Mean Plasma Glucose: 91.06 mg/dL

## 2023-10-15 LAB — TSH: TSH: 4.19 u[IU]/mL (ref 0.350–4.500)

## 2023-10-15 LAB — ETHANOL: Alcohol, Ethyl (B): <10 mg/dL (ref <10)

## 2023-10-15 MED ORDER — HALOPERIDOL 5 MG PO TABS: 5.0000 mg | ORAL_TABLET | Freq: Two times a day (BID) | ORAL | Status: DC

## 2023-10-15 MED ORDER — ACETAMINOPHEN 325 MG PO TABS: 650.0000 mg | ORAL_TABLET | Freq: Four times a day (QID) | ORAL | Status: DC | PRN

## 2023-10-15 MED ORDER — DIVALPROEX SODIUM 250 MG PO DR TAB
250.0000 mg | DELAYED_RELEASE_TABLET | Freq: Two times a day (BID) | ORAL | Status: DC
  Administered 2023-10-15: 250 mg via ORAL
  Filled 2023-10-15: qty 1

## 2023-10-15 MED ORDER — ALUM & MAG HYDROXIDE-SIMETH 200-200-20 MG/5ML PO SUSP: 30.0000 mL | ORAL | Status: DC | PRN

## 2023-10-15 MED ORDER — DIVALPROEX SODIUM 250 MG PO DR TAB: 250.0000 mg | DELAYED_RELEASE_TABLET | Freq: Two times a day (BID) | ORAL | Status: DC

## 2023-10-15 MED ORDER — BENZTROPINE MESYLATE 1 MG PO TABS
1.0000 mg | ORAL_TABLET | Freq: Two times a day (BID) | ORAL | Status: DC
  Administered 2023-10-15: 1 mg via ORAL
  Filled 2023-10-15: qty 1

## 2023-10-15 MED ORDER — HALOPERIDOL 5 MG PO TABS
5.0000 mg | ORAL_TABLET | Freq: Two times a day (BID) | ORAL | Status: DC
  Administered 2023-10-15: 5 mg via ORAL
  Filled 2023-10-15: qty 1

## 2023-10-15 MED ORDER — MAGNESIUM HYDROXIDE 400 MG/5ML PO SUSP: 30.0000 mL | Freq: Every day | ORAL | Status: DC | PRN

## 2023-10-15 MED ORDER — LORAZEPAM 1 MG PO TABS: 2.0000 mg | ORAL_TABLET | Freq: Four times a day (QID) | ORAL | Status: DC | PRN

## 2023-10-15 MED ORDER — LORAZEPAM 2 MG/ML IJ SOLN: 2.0000 mg | Freq: Four times a day (QID) | INTRAMUSCULAR | Status: DC | PRN

## 2023-10-15 NOTE — Discharge Instructions (Addendum)
Pt has been accepted to [facility] on 10/15/2023 Bed assignment: 5 [address]    Pt meets inpatient criteria per: Eligha Bridegroom NP   Attending Physician will be: Orest Dikes MD   Report can be called to: [phone]   Pt can arrive anytime today.

## 2023-10-15 NOTE — ED Notes (Signed)
Pt transferred to [facility]. Pt ambulatory and stable upon discharge. Belongings from locker 32 returned and given to transport service. Safety maintained.

## 2023-10-15 NOTE — ED Provider Notes (Cosign Needed Addendum)
[facility] Urgent Care Medical Screening Exam  Date: 10/15/23 Patient Name: Mario Thomas MRN: [id number] Chief Complaint: delusions, suicidal  Diagnoses:  Final diagnoses:  Schizoaffective disorder, bipolar type [facility])    HPI:  patient presented to [facility] as a walk in voluntarily accompanied by himself with complaints of being suicidal and wanting to die to atone his sins for [employer]. Pt does have hx of schizoaffective dx, bipolar type. Pt last hospitalization at [facility] in December of 2023 for similar presentation of delusions and grandiose behaviors.   Mario Thomas, 22 y.o., male patient seen face to face by this provider, consulted with Dr. Lucianne Muss; and chart reviewed on 10/15/23.  On evaluation Mario Thomas is singing gospel music very loud in the room. He reports that he was dropped off by his dad for evaluation, but "I think I need to be here." Pt does acknowledge mental health hx but states "I don't really know what I have." Pt states he has thought about death because it "wouldn't be a bad thing. It is God's will either way. I just want to make sure I am doing God's will." Pt is very hyper religious during assessment, constantly mentioning God's will and needing to repent for his sins. Pt denies HI. He does endorse auditory and visual hallucinations. He is unable to describe these hallucinations stating "it's like I hear your voice, but I hear a lot of other ones. And I know I am sitting in this room, but it feels like I am in a different room. Or maybe I am not here at all." Pt is RTIS during assessment, constantly looking around the room and laughing inappropriately. He denies illicit substance use or alcohol use.   Pt last [facility] stay in December he was started on Depakote and Haldol. Pt stated he stopped taking those medications shortly after this hospital stay. He was having ACTT services through [facility], but patient stated "oh I told them to stop because I didn't need them. But maybe I do need them." Pt is  agreeable with restarting Depakote and Haldol. Informed patient he will need to return to to IP tx, he is also agreeable.    During evaluation Mario Thomas is sitting in no acute distress.  He is alert, oriented x 4, calm, cooperative and attentive.  His  mood is labile with congruent affect.  He has tangential speech with increased volume.  Objectively there is evidence of psychosis and delusional thinking.  Patient is able to converse, he is RTIS during assessment, laughing inappropriately.  Will recommend inpatient psychiatric treatment.    Total Time spent with patient: 45 minutes  Musculoskeletal  Strength & Muscle Tone: within normal limits Gait & Station: normal Patient leans: N/A  Psychiatric Specialty Exam  Presentation General Appearance:  Fairly Groomed  Eye Contact: Fair  Speech: Clear and Coherent  Speech Volume: Increased  Handedness: Right   Mood and Affect  Mood: Euphoric; Labile  Affect: Labile   Thought Process  Thought Processes: Irrevelant  Descriptions of Associations:Tangential  Orientation:Full (Time, Place and Person)  Thought Content:Illogical; Delusions; Tangential  Diagnosis of Schizophrenia or Schizoaffective disorder in past: Yes  Duration of Psychotic Symptoms: Greater than six months  Hallucinations:Hallucinations: Auditory; Visual  Ideas of Reference:Delusions (grandiose)  Suicidal Thoughts:Suicidal Thoughts: Yes, Passive SI Passive Intent and/or Plan: Without Intent; Without Plan  Homicidal Thoughts:Homicidal Thoughts: No   Sensorium  Memory: Immediate Fair; Recent Fair  Judgment: Impaired  Insight: Fair   Chartered certified accountant: Fair  Attention Span: Fair  Recall: [employer] of Knowledge: Fair  Language: Fair   Psychomotor Activity  Psychomotor Activity: Psychomotor Activity: Restlessness   Assets  Assets: Manufacturing systems engineer; Physical Health; Social Support   Sleep   Sleep: Sleep: Poor   No data recorded  Physical Exam Neurological:     Mental Status: He is alert and oriented to person, place, and time.  Psychiatric:        Attention and Perception: Attention normal.        Mood and Affect: Affect is labile.    Review of Systems  Psychiatric/Behavioral:  The patient has insomnia.        Delusions, grandiose    Blood pressure 121/78, pulse 91, temperature 98 F (36.7 C), temperature source Oral, resp. rate 18, SpO2 100%. There is no height or weight on file to calculate BMI.  Past Psychiatric History: schizoaffective dx, bipolar type   Is the patient at risk to self? Yes  Has the patient been a risk to self in the past 6 months? No .    Has the patient been a risk to self within the distant past? No   Is the patient a risk to others? No   Has the patient been a risk to others in the past 6 months? No   Has the patient been a risk to others within the distant past? No   Past Medical History: N/A  Family History: unknown  Social History:  Currently working part time. Lives with his Father. No current outpatient follow up.   Last Labs:  Admission on 10/15/2023  Component Date Value Ref Range Status   POC Amphetamine UR 10/15/2023 None Detected  NONE DETECTED (Cut Off Level 1000 ng/mL) Final   POC Secobarbital (BAR) 10/15/2023 None Detected  NONE DETECTED (Cut Off Level 300 ng/mL) Final   POC Buprenorphine (BUP) 10/15/2023 None Detected  NONE DETECTED (Cut Off Level 10 ng/mL) Final   POC Oxazepam (BZO) 10/15/2023 None Detected  NONE DETECTED (Cut Off Level 300 ng/mL) Final   POC Cocaine UR 10/15/2023 None Detected  NONE DETECTED (Cut Off Level 300 ng/mL) Final   POC Methamphetamine UR 10/15/2023 None Detected  NONE DETECTED (Cut Off Level 1000 ng/mL) Final   POC Morphine 10/15/2023 None Detected  NONE DETECTED (Cut Off Level 300 ng/mL) Final   POC Methadone UR 10/15/2023 None Detected  NONE DETECTED (Cut Off Level 300 ng/mL) Final    POC Oxycodone UR 10/15/2023 None Detected  NONE DETECTED (Cut Off Level 100 ng/mL) Final   POC Marijuana UR 10/15/2023 None Detected  NONE DETECTED (Cut Off Level 50 ng/mL) Final  Admission on 05/17/2023, Discharged on 05/18/2023  Component Date Value Ref Range Status   Sodium 05/17/2023 139  135 - 145 mmol/L Final   Potassium 05/17/2023 4.3  3.5 - 5.1 mmol/L Final   Chloride 05/17/2023 103  98 - 111 mmol/L Final   CO2 05/17/2023 26  22 - 32 mmol/L Final   Glucose, Bld 05/17/2023 98  70 - 99 mg/dL Final   Glucose reference range applies only to samples taken after fasting for at least 8 hours.   BUN 05/17/2023 12  6 - 20 mg/dL Final   Creatinine, Ser 05/17/2023 0.89  0.61 - 1.24 mg/dL Final   Calcium 16/09/9603 9.7  8.9 - 10.3 mg/dL Final   GFR, Estimated 05/17/2023 >60  >60 mL/min Final   Comment: (NOTE) Calculated using the CKD-EPI Creatinine Equation (2021)    Anion gap 05/17/2023  10  5 - 15 Final   Performed at [facility], [address] [place], [place] [address]   WBC 05/17/2023 7.4  4.0 - 10.5 K/uL Final   RBC 05/17/2023 5.16  4.22 - 5.81 MIL/uL Final   Hemoglobin 05/17/2023 15.4  13.0 - 17.0 g/dL Final   HCT 60/45/4098 46.6  39.0 - 52.0 % Final   MCV 05/17/2023 90.3  80.0 - 100.0 fL Final   MCH 05/17/2023 29.8  26.0 - 34.0 pg Final   MCHC 05/17/2023 33.0  30.0 - 36.0 g/dL Final   RDW 11/91/4782 12.4  11.5 - 15.5 % Final   Platelets 05/17/2023 193  150 - 400 K/uL Final   nRBC 05/17/2023 0.0  0.0 - 0.2 % Final   Performed at [facility], [address] [place], [place] [address]   Troponin I (High Sensitivity) 05/17/2023 <2  <18 ng/L Final   Comment: (NOTE) Elevated high sensitivity troponin I (hsTnI) values and significant  changes across serial measurements may suggest ACS but many other  chronic and acute conditions are known to elevate hsTnI results.  Refer to the "Links" section for chest pain algorithms and additional  guidance. Performed at [facility], [address] [place], [place] [address]    Troponin I (High Sensitivity) 05/17/2023 3  <18 ng/L Final   Comment: (NOTE) Elevated high sensitivity troponin I (hsTnI) values and significant  changes across serial measurements may suggest ACS but many other  chronic and acute conditions are known to elevate hsTnI results.  Refer to the "Links" section for chest pain algorithms and additional  guidance. Performed at [facility], [address] [place], [place] [address]    D-Dimer, Quant 05/18/2023 <0.27  0.00 - 0.50 ug/mL-FEU Final   Comment: (NOTE) At the manufacturer cut-off value of 0.5 g/mL FEU, this assay has a negative predictive value of 95-100%.This assay is intended for use in conjunction with a clinical pretest probability (PTP) assessment model to exclude pulmonary embolism (PE) and deep venous thrombosis (DVT) in outpatients suspected of PE or DVT. Results should be correlated with clinical presentation. Performed at [facility], [address] [place], [place] [address]     Allergies: Patient has no known allergies.  Medications:  Facility Ordered Medications  Medication   divalproex (DEPAKOTE) DR tablet 250 mg   haloperidol (HALDOL) tablet 5 mg   benztropine (COGENTIN) tablet 1 mg   acetaminophen (TYLENOL) tablet 650 mg   alum & mag hydroxide-simeth (MAALOX/MYLANTA) 200-200-20 MG/5ML suspension 30 mL   magnesium hydroxide (MILK OF MAGNESIA) suspension 30 mL   LORazepam (ATIVAN) tablet 2 mg   Or   LORazepam (ATIVAN) injection 2 mg   PTA Medications  Medication Sig   benztropine (COGENTIN) 1 MG tablet Take 1 tablet (1 mg total) by mouth 2 (two) times daily. (Patient not taking: Reported on 10/15/2023)   hydrOXYzine (ATARAX) 25 MG tablet Take 1 tablet (25 mg total) by mouth 3 (three) times daily as needed for anxiety. (Patient not taking: Reported on 10/15/2023)   traZODone (DESYREL) 50 MG tablet Take 1 tablet (50 mg total) by mouth  at bedtime. (Patient not taking: Reported on 10/15/2023)   divalproex (DEPAKOTE) 250 MG DR tablet Take 3 tablets (750 mg total) by mouth every 12 (twelve) hours. (Patient not taking: Reported on 10/15/2023)   haloperidol (HALDOL) 5 MG tablet Take 1 tablet (5 mg total) by mouth every 12 (twelve) hours. (Patient  not taking: Reported on 10/15/2023)   propranolol (INDERAL) 10 MG tablet Take 1 tablet (10 mg total) by mouth every 12 (twelve) hours. (Patient not taking: Reported on 10/15/2023)   naproxen (NAPROSYN) 500 MG tablet Take 1 tablet (500 mg total) by mouth 2 (two) times daily. (Patient not taking: Reported on 10/15/2023)      Medical Decision Making  Pt will need inpatient psychiatric treatment.   Plan:  - Start Depakote 250 mg BID with plans to titrate - Start haldol 5 mg BID - Start Cogentin 1 mg BID  - Pt will transfer to [facility] Today for IP treatment  Recommendations  Inpatient psychiatric treatment. Pt is currently voluntary, if he requested discharge he would meet IVC criteria.   Eligha Bridegroom, NP 10/15/23  12:32 PM

## 2023-10-15 NOTE — Progress Notes (Signed)
   10/15/23 1026  [facility] Triage Screening (Walk-ins at [facility] only)  How Did You Hear About [place]? Self  What Is the Reason for Your Visit/Call Today? Mario Thomas is a 22 y.o. male who presents to [facility] voluntarily and unaccompanied seeking an evaluation. Pt reports a  history of Bipolar disorder and Schizoaffective disorder. Pt reports SI but states he does not have a plan. Pt states "I want to die to atone for my sins" "It's God will for me to die". Pt reports last September he attempted to overdose on sleep medication and was hospitalized for 10 days at [facility]. Pt states "God allowed my great aunt to die 13 years ago by suicide, I think he wants me to do the same". Pt states he has been without medication for about 6 months. Pt states he does not remember who was prescribing his medication. Pt states he not established with outpatient therapy at this time. Pt reports auditory hallucinations today but cannot recall what the voice said. Pt states he lives with her parents. Pt reports visual hallucinations currently, he states "I see your face morphing into the face of an angel". Pt denies HI.  How Long Has This Been Causing You Problems? <Week  Have You Recently Had Any Thoughts About Hurting Yourself? Yes  Are You Planning to Commit Suicide/Harm Yourself At This time? No  Have you Recently Had Thoughts About Hurting Someone [name]? No  Are You Planning To Harm Someone At This Time? No  Explanation: NA  Are you currently experiencing any auditory, visual or other hallucinations? Yes  Please explain the hallucinations you are currently experiencing: hearing a voice today but cannot recall what was said. visual hallucinations currently, he states "I see your face morphing into the face of an angel"  Have You Used Any Alcohol or Drugs in the Past 24 Hours? No  What Did You Use and How Much? NA  Do you have any current medical co-morbidities that require immediate attention? No  What Do You Feel Would Help You  the Most Today? Treatment for Depression or other mood problem  If access to [facility] was not available, would you have sought care in the Emergency Department? No  Determination of Need Urgent (48 hours)  Options For Referral Inpatient Hospitalization

## 2023-10-15 NOTE — Progress Notes (Signed)
LCSW Progress Note  [id number]   Mario Thomas  10/15/2023  12:57 PM  Description:   Inpatient Psychiatric Referral  Patient was recommended inpatient per Eligha Bridegroom NP There are no available beds at [facility], per [facility] Rona Ravens AC/RN Patient was referred to the following out of network facilities:    Destination  Service Provider Address Phone Fax  [facility]  [address], [place] [address] [phone]  [facility] [address]  [address], [place] [address] [phone]  [facility]  [address], [place] [address] [phone]  [facility]  [address] [place] [address] [phone]  [facility]  [address] [place], [place] [address] [phone]  [facility]  [address] [place] [address] [phone]  [facility]  [address], [place] [address] [phone]  [facility]  [address] [place] [address] [phone]  [facility] EFAX  [address] [place], [place] [phone]  [facility]  [address] [place] [address] [phone]  [facility]  [address] [place], [place] [address] [phone]  [facility]  [address] [place] [address] [phone]  [facility]  [place] [phone]  [facility]  [address] [place] [address] [phone]      Situation ongoing, CSW to continue following and update chart as more information becomes available.     [name] LCSW-A   10/15/2023 12:57 PM

## 2023-10-15 NOTE — BH Assessment (Signed)
Comprehensive Clinical Assessment (CCA) Note  10/15/2023 [name] [id number]  Disposition: Per Eligha Bridegroom NP, patient is recommended for inpatient treatment.   The patient demonstrates the following risk factors for suicide: Chronic risk factors for suicide include: psychiatric disorder of schizoaffective disorder bipolar type and completed suicide in a family member. Acute risk factors for suicide include: N/A. Protective factors for this patient include: positive social support and hope for the future. Considering these factors, the overall suicide risk at this point appears to be moderate. Patient is not appropriate for outpatient follow up.  Chief Complaint:  Chief Complaint  Patient presents with   Suicidal   Hallucinations   Visit Diagnosis: Schizoaffective disorder, bipolar type (HCC)     CCA Screening, Triage and Referral (STR)  Patient Reported Information How did you hear about [place]? Self  What Is the Reason for Your Visit/Call Today? Mario Thomas is a 22 y.o. male who presents to [facility] voluntarily and unaccompanied seeking an evaluation. Pt reports a  history of Bipolar disorder and Schizoaffective disorder. Pt reports SI but states he does not have a plan. Pt states "I want to die to atone for my sins" "It's God will for me to die". Pt reports last September he attempted to overdose on sleep medication and was hospitalized for 10 days at [facility]. Pt states "God allowed my great aunt to die 13 years ago by suicide, I think he wants me to do the same". Pt states he has been without medication for about 6 months. Pt states he does not remember who was prescribing his medication. Pt states he not established with outpatient therapy at this time. Pt reports auditory hallucinations today but cannot recall what the voice said. Pt states he lives with her parents. Pt reports visual hallucinations currently, he states "I see your face morphing into the face of an angel". Pt denies  HI.  Patient has a history of enhanced services with [facility] last year. Patient is no longer with [facility] and appears to be disengaged with all outpatient services. Patient reports non-command hallucinations of hearing voices in the distant. Patient also reports VH of seeing things that he normal would not see. Patient reports AVH during assessment and is responding to internal stimuli. Patient is laughing inappropriately and when asked what he is laughing at he reports having no control of his laughter. Patient reports feeling like he is in two places at one time, and he is hyper-religious.  Patient has insight into his illness stating, "I know I'm not right". Patient reports that his father dropped him off here today to be seen due to him acting differently. Patient reports he changed his schedule at work and has been going live on [employer] and singing. Patient reports that he has been having a hard time sleeping for the past few weeks, only getting 4-5 hours a night.   Patient is oriented to person and place, patient eye contact is fleeting, speech is clear. Patient presents possibly hypomanic, hyper-religious with psychotic features. Patient endorses suicidal ideations stating that "my sins and evil things I have done is catching up with me and maybe its time for me to die. Its too much living going on". Patient reports that a family committed suicide which was ordained by God. Patient denies HI and SIB.    How Long Has This Been Causing You Problems? <Week  What Do You Feel Would Help You the Most Today? Treatment for Depression or other mood problem  Have You Recently Had Any Thoughts About Hurting Yourself? Yes  Are You Planning to Commit Suicide/Harm Yourself At This time? No   Flowsheet Row ED from 10/15/2023 in [facility] ED from 05/17/2023 in [facility] Emergency Department [facility]Discharged[facility]  C-SSRS RISK CATEGORY Moderate Risk No Risk No Risk       Have you Recently Had Thoughts About Hurting Someone [name]? No  Are You Planning to Harm Someone at This Time? No  Explanation: NA   Have You Used Any Alcohol or Drugs in the Past 24 Hours? No  What Did You Use and How Much? NA   Do You Currently Have a Therapist/Psychiatrist? No  Name of Therapist/Psychiatrist: Name of Therapist/Psychiatrist: NA   Have You Been Recently Discharged From Any Office Practice or Programs? No  Explanation of Discharge From Practice/Program: NA     CCA Screening Triage Referral Assessment Type of Contact: Face-to-Face  Telemedicine Service Delivery:   Is this Initial or Reassessment?   Date Telepsych consult ordered in [facility]:    Time Telepsych consult ordered in [facility]:    Location of Assessment: [facility] Assessment Services  Provider Location: [facility] Assessment Services   Collateral Involvement: NA   Does Patient Have a Automotive engineer Guardian? No  Legal Guardian Contact Information: NA  Copy of Legal Guardianship Form: -- (NA)  Legal Guardian Notified of Arrival: -- (NA)  Legal Guardian Notified of Pending Discharge: -- (NA)  If Minor and Not Living with Parent(s), Who has Custody? NA  Is CPS involved or ever been involved? Never  Is APS involved or ever been involved? Never   Patient Determined To Be At Risk for Harm To Self or Others Based on Review of Patient Reported Information or Presenting Complaint? Yes, for Self-Harm  Method: No Plan  Availability of Means: No access or NA  Intent: Vague intent or NA  Notification Required: No need or identified person  Additional Information for Danger to Others Potential: Active psychosis  Additional Comments for Danger to Others Potential: NA  Are There Guns or Other Weapons in Your Home? No  Types of Guns/Weapons: NA  Are These Weapons Safely Secured?                            --  (NA)  Who Could Verify You Are Able To Have These Secured: FAMILY  Do You Have any Outstanding Charges, Pending Court Dates, Parole/Probation? DENIES  Contacted To Inform of Risk of Harm To Self or Others: NA   Does Patient Present under Involuntary Commitment? No    [place] of Residence: [place]   Patient Currently Receiving the Following Services: Not Receiving Services   Determination of Need: Urgent (48 hours)   Options For Referral: Inpatient Hospitalization     CCA Biopsychosocial Patient Reported Schizophrenia/Schizoaffective Diagnosis in Past: Yes   Strengths: Familyis supportive   Mental Health Symptoms Depression:   Difficulty Concentrating; Irritability   Duration of Depressive symptoms:    Mania:   Change in energy/activity; Euphoria; Increased Energy; Racing thoughts; Recklessness   Anxiety:    Worrying; Difficulty concentrating   Psychosis:   Hallucinations   Duration of Psychotic symptoms:  Duration of Psychotic Symptoms: Greater than six months   Trauma:   None   Obsessions:   None   Compulsions:   None   Inattention:  None   Hyperactivity/Impulsivity:   None   Oppositional/Defiant Behaviors:   None   Emotional Irregularity:   Mood lability   Other Mood/Personality Symptoms:   None    Mental Status Exam Appearance and self-care  Stature:   Average   Weight:   Average weight   Clothing:   Disheveled; Careless/inappropriate   Grooming:   Bizarre   Cosmetic use:   None   Posture/gait:   Tense   Motor activity:   Repetitive; Restless   Sensorium  Attention:   Distractible; Unaware   Concentration:   Preoccupied; Focuses on irrelevancies   Orientation:   Person; Situation; Place   Recall/memory:   Defective in Immediate; Defective in Short-term; Defective in Recent   Affect and Mood  Affect:   Constricted; Not Congruent   Mood:   Euphoric; Hypomania   Relating  Eye contact:    Fleeting   Facial expression:   Tense   Attitude toward examiner:   Silly; Cooperative   Thought and Language  Speech flow:  Flight of Ideas; Pressured; Loud   Thought content:   Delusions   Preoccupation:   None   Hallucinations:   Auditory; Visual   Organization:   Irrelevant; Loose   Company secretary of Knowledge:   Fair   Intelligence:   Average   Abstraction:   Normal   Judgement:   Poor   Reality Testing:   Distorted   Insight:   Lacking   Decision Making:   Impulsive; Vacilates   Social Functioning  Social Maturity:   Irresponsible   Social Judgement:   Impropriety; Heedless   Stress  Stressors:   Transitions   Coping Ability:   Overwhelmed   Skill Deficits:   Responsibility; Self-control; Interpersonal; Decision making; Communication   Supports:   Family     Religion: Religion/Spirituality Are You A Religious Person?: Yes What is Your Religious Affiliation?: Christian How Might This Affect Treatment?: NA  Leisure/Recreation: Leisure / Recreation Do You Have Hobbies?: Yes Leisure and Hobbies: SINGING  Exercise/Diet: Exercise/Diet Do You Exercise?: No Have You Gained or Lost A Significant Amount of Weight in the Past Six Months?: No Do You Follow a Special Diet?: No Do You Have Any Trouble Sleeping?: Yes Explanation of Sleeping Difficulties: 4 HOURS OVER THE PAST COUPLE WEEKS   CCA Employment/Education Employment/Work Situation: Employment / Work Situation Employment Situation: Employed Work Stressors: NA Patient's Job has Been Impacted by Current Illness: Yes Describe how Patient's Job has Been Impacted: MISSING WORK Has Patient ever Been in Equities trader?: No  Education: Education Last Grade Completed: 14 Did You Product manager?: Yes What Type of College Degree Do you Have?: UTA Did You Have An Individualized Education Program (IIEP): No Did You Have Any Difficulty At School?: No Patient's  Education Has Been Impacted by Current Illness: No   CCA Family/Childhood History Family and Relationship History: Family history Marital status: Single Does patient have children?: No  Childhood History:  Childhood History By whom was/is the patient raised?: Both parents Did patient suffer any verbal/emotional/physical/sexual abuse as a child?: No Did patient suffer from severe childhood neglect?: No Has patient ever been sexually abused/assaulted/raped as an adolescent or adult?: No Was the patient ever a victim of a crime or a disaster?: No Witnessed domestic violence?: No Has patient been affected by domestic violence as an adult?: No       CCA Substance Use Alcohol/Drug Use: Alcohol / Drug Use Pain Medications: See MAR Prescriptions: See  MAR Over the Counter: See MAR History of alcohol / drug use?: No history of alcohol / drug abuse Longest period of sobriety (when/how long): UTA - patient is unable process questions d/t to current mental status                         ASAM's:  Six Dimensions of Multidimensional Assessment  Dimension 1:  Acute Intoxication and/or Withdrawal Potential:      Dimension 2:  Biomedical Conditions and Complications:      Dimension 3:  Emotional, Behavioral, or Cognitive Conditions and Complications:     Dimension 4:  Readiness to Change:     Dimension 5:  Relapse, Continued use, or Continued Problem Potential:     Dimension 6:  Recovery/Living Environment:     ASAM Severity Score:    ASAM Recommended Level of Treatment:     Substance use Disorder (SUD)    Recommendations for Services/Supports/Treatments: Recommendations for Services/Supports/Treatments Recommendations For Services/Supports/Treatments: Individual Therapy, Inpatient Hospitalization, ACCTT (Assertive Community Treatment)  Discharge Disposition: Discharge Disposition Medical Exam completed: Yes Disposition of Patient: Admit  DSM5 Diagnoses: Patient  Active Problem List   Diagnosis Date Noted   Schizoaffective disorder, bipolar type (HCC) 09/24/2022   Schizoaffective disorder (HCC) 08/19/2022   Anxiety state 06/12/2022   Insomnia 06/12/2022   Alcohol use 06/12/2022   Bipolar disorder with psychotic features (HCC) 11/09/2021   Involuntary commitment 11/06/2021   Allergic rhinitis 05/09/2012   Functional heart murmur 04/04/2012     Referrals to Alternative Service(s): Referred to Alternative Service(s):   Place:   Date:   Time:    Referred to Alternative Service(s):   Place:   Date:   Time:    Referred to Alternative Service(s):   Place:   Date:   Time:    Referred to Alternative Service(s):   Place:   Date:   Time:     Audree Camel, [facility]

## 2023-10-15 NOTE — Progress Notes (Signed)
Pt has been accepted to [facility] on 10/15/2023 Bed assignment: 5 West   Pt meets inpatient criteria per: Eligha Bridegroom NP  Attending Physician will be: Orest Dikes MD  Report can be called to: [phone]  Pt can arrive anytime today.   Care Team Notified: [facility]  Rona Ravens RN, Eligha Bridegroom NP [place] Ward RN    [place] Vonetta Foulk LCSW-A   10/15/2023 1:24 PM

## 2023-10-15 NOTE — ED Notes (Signed)
Pt given lunch

## 2023-10-15 NOTE — ED Notes (Addendum)
Patient brought to unit, orientated and offered meal. Patient is A&O x4, denies SI/HI at this time. Patient denies A/VH stating "I don't think so".  Patient denies pain or other physical complaints at this time. Patient calm and composed, in no acute distress. Environment secured, patient safety checks in place per facility policy.

## 2023-10-24 ENCOUNTER — Ambulatory Visit (HOSPITAL_COMMUNITY)
Admission: EM | Admit: 2023-10-24 | Discharge: 2023-10-25 | Disposition: A | Discharge status: Home / Self Care | Payer: BC Managed Care – PPO | Attending: Psychiatry | Admitting: Psychiatry

## 2023-10-24 ENCOUNTER — Other Ambulatory Visit: Payer: Self-pay

## 2023-10-24 DIAGNOSIS — F250 Schizoaffective disorder, bipolar type: Secondary | ICD-10-CM | POA: Diagnosis not present

## 2023-10-24 DIAGNOSIS — B350 Tinea barbae and tinea capitis: Secondary | ICD-10-CM | POA: Insufficient documentation

## 2023-10-24 LAB — POCT URINE DRUG SCREEN - MANUAL ENTRY (I-SCREEN)
POC Amphetamine UR: NOT DETECTED
POC Buprenorphine (BUP): NOT DETECTED
POC Cocaine UR: NOT DETECTED
POC Marijuana UR: NOT DETECTED
POC Methadone UR: NOT DETECTED
POC Methamphetamine UR: NOT DETECTED
POC Morphine: NOT DETECTED
POC Oxazepam (BZO): NOT DETECTED
POC Oxycodone UR: NOT DETECTED
POC Secobarbital (BAR): NOT DETECTED

## 2023-10-24 MED ORDER — HALOPERIDOL 2 MG PO TABS: 2.0000 mg | ORAL_TABLET | Freq: Two times a day (BID) | ORAL | 0 refills | Status: DC

## 2023-10-24 MED ORDER — DIVALPROEX SODIUM 250 MG PO DR TAB: 250.0000 mg | DELAYED_RELEASE_TABLET | Freq: Two times a day (BID) | ORAL | 0 refills | Status: DC

## 2023-10-24 MED ORDER — TRAZODONE HCL 50 MG PO TABS: 50.0000 mg | ORAL_TABLET | Freq: Every day | ORAL | 0 refills | Status: DC

## 2023-10-24 MED ORDER — BENZTROPINE MESYLATE 1 MG PO TABS
2.0000 mg | ORAL_TABLET | Freq: Every day | ORAL | Status: DC
  Administered 2023-10-25: 2 mg via ORAL
  Filled 2023-10-24: qty 2

## 2023-10-24 MED ORDER — TRAZODONE HCL 50 MG PO TABS
50.0000 mg | ORAL_TABLET | Freq: Once | ORAL | Status: AC
  Administered 2023-10-24: 50 mg via ORAL
  Filled 2023-10-24: qty 1

## 2023-10-24 MED ORDER — FLUCONAZOLE 100 MG PO TABS
200.0000 mg | ORAL_TABLET | Freq: Once | ORAL | Status: AC
  Administered 2023-10-24: 200 mg via ORAL
  Filled 2023-10-24: qty 2

## 2023-10-24 MED ORDER — MAGNESIUM HYDROXIDE 400 MG/5ML PO SUSP: 30.0000 mL | Freq: Every day | ORAL | Status: DC | PRN

## 2023-10-24 MED ORDER — LORAZEPAM 1 MG PO TABS
2.0000 mg | ORAL_TABLET | Freq: Once | ORAL | Status: AC
  Administered 2023-10-24: 2 mg via ORAL
  Filled 2023-10-24: qty 2

## 2023-10-24 MED ORDER — ACETAMINOPHEN 325 MG PO TABS: 650.0000 mg | ORAL_TABLET | Freq: Four times a day (QID) | ORAL | Status: DC | PRN

## 2023-10-24 MED ORDER — ZIPRASIDONE MESYLATE 20 MG IM SOLR: 20.0000 mg | Freq: Two times a day (BID) | INTRAMUSCULAR | Status: DC | PRN

## 2023-10-24 MED ORDER — HALOPERIDOL DECANOATE 100 MG/ML IM SOLN
100.0000 mg | Freq: Once | INTRAMUSCULAR | Status: AC
  Administered 2023-10-24: 100 mg via INTRAMUSCULAR
  Filled 2023-10-24: qty 1

## 2023-10-24 MED ORDER — LORAZEPAM 1 MG PO TABS: 1.0000 mg | ORAL_TABLET | ORAL | Status: DC | PRN

## 2023-10-24 MED ORDER — HALOPERIDOL 2 MG PO TABS: 2.0000 mg | ORAL_TABLET | Freq: Two times a day (BID) | ORAL | Status: DC

## 2023-10-24 MED ORDER — HALOPERIDOL 2 MG PO TABS
2.0000 mg | ORAL_TABLET | Freq: Two times a day (BID) | ORAL | Status: DC
  Administered 2023-10-24 – 2023-10-25 (×2): 2 mg via ORAL
  Filled 2023-10-24 (×4): qty 1

## 2023-10-24 MED ORDER — DIVALPROEX SODIUM 250 MG PO DR TAB
250.0000 mg | DELAYED_RELEASE_TABLET | Freq: Two times a day (BID) | ORAL | Status: DC
  Administered 2023-10-25: 250 mg via ORAL
  Filled 2023-10-24 (×4): qty 1

## 2023-10-24 MED ORDER — ALUM & MAG HYDROXIDE-SIMETH 200-200-20 MG/5ML PO SUSP: 30.0000 mL | ORAL | Status: DC | PRN

## 2023-10-24 MED ORDER — BENZTROPINE MESYLATE 2 MG PO TABS: 2.0000 mg | ORAL_TABLET | Freq: Every day | ORAL | 0 refills | Status: DC

## 2023-10-24 NOTE — ED Notes (Signed)
Pt is currently sleeping, no distress noted, environmental check complete, will continue to monitor patient for safety.  

## 2023-10-24 NOTE — Progress Notes (Signed)
   10/24/23 1407  [facility] Triage Screening (Walk-ins at [facility] only)  What Is the Reason for Your Visit/Call Today? Pt presents to [facility] voluntarily via [employer]. Pt states he is here for food. This Clinical research associate asked the pt, if he recently had any thoughts about hurting himself and the pt just screamed. Pt is unable to be triaged at this time.  How Long Has This Been Causing You Problems?  (UTA)  Have You Recently Had Any Thoughts About Hurting Yourself?  (UTA)  Are You Planning to Commit Suicide/Harm Yourself At This time?  (UTA)  Have you Recently Had Thoughts About Hurting Someone Else?  (UTA)  Are You Planning To Harm Someone At This Time?  (UTA)  Are you currently experiencing any auditory, visual or other hallucinations?  (UTA)  Have You Used Any Alcohol or Drugs in the Past 24 Hours?  (UTA)  Do you have any current medical co-morbidities that require immediate attention?  (UTA)  What Do You Feel Would Help You the Most Today?  (UTA)  If access to [facility] was not available, would you have sought care in the Emergency Department?  (UTA)  Determination of Need Urgent (48 hours)  Options For Referral Medication Management;Inpatient Hospitalization;Outpatient Therapy

## 2023-10-24 NOTE — Discharge Instructions (Addendum)
You have been referred to [facility] in [place]. Your next Haldol injection is scheduled for 11/19/2023 28 days from administration day. Outpatient will administer your next injection. Please call to confirm scheduled appointment and the time. Continue Haldol 2 mg twice daily for 15 days then discontinue. All other medications will remain unchanged  [facility] [address], [place], [place] [address] Phone: [phone]

## 2023-10-24 NOTE — ED Notes (Signed)
Patient placed in flex d/t inappropriate sexual behavior towards another patient and staff. Patient attempted to hug a nurse but was redirected easily. Patient then stated he wanted a woman but did not elaborate. Patient observed hyper focused on the females on the [place] including staff. Patient is easily redirected.

## 2023-10-24 NOTE — BH Assessment (Signed)
Comprehensive Clinical Assessment (CCA) Note   10/24/2023 Mario Thomas [id number]  Disposition:  Jerrilyn Cairo, NP recommends continuous observation overnight. Pt was be seen by psychiatry in AM.   The patient demonstrates the following risk factors for suicide: Chronic risk factors for suicide include: psychiatric disorder of Schizophrenia . Acute risk factors for suicide include: social withdrawal/isolation. Protective factors for this patient include: positive social support. Considering these factors, the overall suicide risk at this point appears to be low. Patient is not appropriate for outpatient follow up.    Pt presents to [facility] voluntarily via [employer]. Pt states he is here for food. This Clinical research associate asked the pt, if he recently had any thoughts about hurting himself and the pt just screamed. Pt is unable to be triaged at this time.  Pt appears psychotic and unable to answer questions appropriately .     Chief Complaint:  Chief Complaint  Patient presents with   Evaluation   Visit Diagnosis:   Schizophrenia     CCA Screening, Triage and Referral (STR)  Patient Reported Information How did you hear about [place]? Self  What Is the Reason for Your Visit/Call Today? Pt presents to [facility] voluntarily via [employer]. Pt states he is here for food. This Clinical research associate asked the pt, if he recently had any thoughts about hurting himself and the pt just screamed. Pt is unable to be triaged at this time.  How Long Has This Been Causing You Problems? -- (UTA)  What Do You Feel Would Help You the Most Today? -- (UTA)   Have You Recently Had Any Thoughts About Hurting Yourself? -- (UTA)  Are You Planning to Commit Suicide/Harm Yourself At This time? -- (UTA)   Flowsheet Row ED from 10/15/2023 in [facility] ED from 05/17/2023 in [facility] Emergency Department at [facility] Admission (Discharged) [facility]  C-SSRS RISK  CATEGORY Moderate Risk No Risk No Risk       Have you Recently Had Thoughts About Hurting Someone Else? -- (UTA)  Are You Planning to Harm Someone at This Time? -- (UTA)  Explanation: Pt presents psychotic and unable to answer   Have You Used Any Alcohol or Drugs in the Past 24 Hours? -- (UTA)  What Did You Use and How Much? Pt presents psychotic and unable to answer   Do You Currently Have a Therapist/Psychiatrist? No  Name of Therapist/Psychiatrist: Name of Therapist/Psychiatrist: Pt presents psychotic and unable to answer   Have You Been Recently Discharged From Any Office Practice or Programs? No  Explanation of Discharge From Practice/Program: Pt presents psychotic and unable to answer     CCA Screening Triage Referral Assessment Type of Contact: Face-to-Face  Telemedicine Service Delivery:   Is this Initial or Reassessment?   Date Telepsych consult ordered in [facility]:    Time Telepsych consult ordered in [facility]:    Location of Assessment: [facility] Assessment Services  Provider Location: [facility] Assessment Services   Collateral Involvement: None   Does Patient Have a Automotive engineer Guardian? No  Legal Guardian Contact Information: N/A  Copy of Legal Guardianship Form: -- (N/A)  Legal Guardian Notified of Arrival: -- (N/A)  Legal Guardian Notified of Pending Discharge: -- (N/A)  If Minor and Not Living with Parent(s), Who has Custody? N/A  Is CPS involved or ever been involved? Never  Is APS involved or ever been involved? Never   Patient Determined To Be At Risk  for Harm To Self or Others Based on Review of Patient Reported Information or Presenting Complaint? -- (Pt presents psychotic and unable to answer)  Method: -- (Pt presents psychotic and unable to answer)  Availability of Means: -- (Pt presents psychotic and unable to answer)  Intent: -- (Pt presents psychotic and unable to answer)  Notification Required: -- (Pt presents psychotic and  unable to answer)  Additional Information for Danger to Others Potential: -- (N/A)  Additional Comments for Danger to Others Potential: N/A  Are There Guns or Other Weapons in Your Home? -- (Pt presents psychotic and unable to answer)  Types of Guns/Weapons: Pt presents psychotic and unable to answer  Are These Weapons Safely Secured?                            -- (Pt presents psychotic and unable to answer)  Who Could Verify You Are Able To Have These Secured: Pt presents psychotic and unable to answer  Do You Have any Outstanding Charges, Pending Court Dates, Parole/Probation? Pt presents psychotic and unable to answer  Contacted To Inform of Risk of Harm To Self or Others: -- (N/A)    Does Patient Present under Involuntary Commitment? No    [place] of Residence: [place]   Patient Currently Receiving the Following Services: -- (Pt presents psychotic and unable to answer)   Determination of Need: Urgent (48 hours)   Options For Referral: Medication Management; Inpatient Hospitalization; Outpatient Therapy     CCA Biopsychosocial Patient Reported Schizophrenia/Schizoaffective Diagnosis in Past: Yes   Strengths: Familyis supportive   Mental Health Symptoms Depression:   Difficulty Concentrating   Duration of Depressive symptoms:  Duration of Depressive Symptoms: Greater than two weeks   Mania:   Change in energy/activity; Euphoria; Increased Energy; Racing thoughts; Recklessness (Pt was observed singing loudly)   Anxiety:    Worrying; Difficulty concentrating   Psychosis:   Hallucinations   Duration of Psychotic symptoms:    Trauma:   None   Obsessions:   None   Compulsions:   None   Inattention:   None   Hyperactivity/Impulsivity:   None   Oppositional/Defiant Behaviors:   None   Emotional Irregularity:   Mood lability   Other Mood/Personality Symptoms:   None    Mental Status Exam Appearance and self-care  Stature:   Average    Weight:   Average weight   Clothing:   Disheveled; Careless/inappropriate   Grooming:   Bizarre   Cosmetic use:   None   Posture/gait:   Tense   Motor activity:   Repetitive; Restless   Sensorium  Attention:   Distractible; Unaware   Concentration:   Preoccupied; Focuses on irrelevancies   Orientation:   Person; Situation; Place   Recall/memory:   Defective in Immediate; Defective in Short-term; Defective in Recent   Affect and Mood  Affect:   Constricted; Not Congruent   Mood:   Euphoric; Hypomania   Relating  Eye contact:   Fleeting   Facial expression:   Tense   Attitude toward examiner:   Silly; Cooperative   Thought and Language  Speech flow:  Flight of Ideas; Pressured; Loud   Thought content:   Delusions   Preoccupation:   None   Hallucinations:   Auditory; Visual   Organization:   Irrelevant; Loose   [employer]utive [employer] of Knowledge:   Fair   Intelligence:   Average   Abstraction:   Normal  Judgement:   Poor   Reality Testing:   Distorted   Insight:   Lacking   Decision Making:   Impulsive; Vacilates   Social Functioning  Social Maturity:   Irresponsible   Social Judgement:   Impropriety; Heedless   Stress  Stressors:   Transitions   Coping Ability:   Overwhelmed   Skill Deficits:   Responsibility; Self-control; Interpersonal; Decision making; Communication   Supports:   Family     Religion: Religion/Spirituality Are You A Religious Person?: Yes What is Your Religious Affiliation?: Christian How Might This Affect Treatment?: NA  Leisure/Recreation: Leisure / Recreation Do You Have Hobbies?: Yes Leisure and Hobbies: SINGING  Exercise/Diet: Exercise/Diet Do You Exercise?: No Have You Gained or Lost A Significant Amount of Weight in the Past Six Months?: No Do You Follow a Special Diet?: No Do You Have Any Trouble Sleeping?: Yes Explanation of Sleeping Difficulties: 4 hours  nightly   CCA Employment/Education Employment/Work Situation: Employment / Work Situation Employment Situation: Employed Work Stressors: NA Patient's Job has Been Impacted by Current Illness: Yes Describe how Patient's Job has Been Impacted: MISSING WORK Has Patient ever Been in the [employer]?: No  Education: Education Is Patient Currently Attending School?: No Last Grade Completed: 14 Did You Product manager?: Yes What Type of College Degree Do you Have?: UTA Did You Have An Individualized Education Program (IIEP): No Did You Have Any Difficulty At School?: No Patient's Education Has Been Impacted by Current Illness: No   CCA Family/Childhood History Family and Relationship History: Family history Marital status: Single Does patient have children?: No  Childhood History:  Childhood History By whom was/is the patient raised?: Both parents Did patient suffer any verbal/emotional/physical/sexual abuse as a child?: No Did patient suffer from severe childhood neglect?: No Has patient ever been sexually abused/assaulted/raped as an adolescent or adult?: No Was the patient ever a victim of a crime or a disaster?: No Witnessed domestic violence?: No Has patient been affected by domestic violence as an adult?: No       CCA Substance Use Alcohol/Drug Use: Alcohol / Drug Use Pain Medications: See MAR Prescriptions: See MAR Over the Counter: See MAR History of alcohol / drug use?: No history of alcohol / drug abuse Longest period of sobriety (when/how long): UTA - patient is unable process questions d/t to current mental status Negative Consequences of Use:  (n/a) Withdrawal Symptoms:  (n/a)                         ASAM's:  Six Dimensions of Multidimensional Assessment  Dimension 1:  Acute Intoxication and/or Withdrawal Potential:      Dimension 2:  Biomedical Conditions and Complications:      Dimension 3:  Emotional, Behavioral, or Cognitive Conditions and  Complications:     Dimension 4:  Readiness to Change:     Dimension 5:  Relapse, Continued use, or Continued Problem Potential:     Dimension 6:  Recovery/Living Environment:     ASAM Severity Score:    ASAM Recommended Level of Treatment: ASAM Recommended Level of Treatment:  (n/a)   Substance use Disorder (SUD) Substance Use Disorder (SUD)  Checklist Symptoms of Substance Use:  (n/a)  Recommendations for Services/Supports/Treatments: Recommendations for Services/Supports/Treatments Recommendations For Services/Supports/Treatments: Individual Therapy, Inpatient Hospitalization, ACCTT (Assertive Community Treatment)  Discharge Disposition:    DSM5 Diagnoses: Patient Active Problem List   Diagnosis Date Noted   Schizoaffective disorder, bipolar type (HCC) 09/24/2022   Schizoaffective  disorder (HCC) 08/19/2022   Anxiety state 06/12/2022   Insomnia 06/12/2022   Alcohol use 06/12/2022   Bipolar disorder with psychotic features (HCC) 11/09/2021   Involuntary commitment 11/06/2021   Allergic rhinitis 05/09/2012   Functional heart murmur 04/04/2012     Referrals to Alternative Service(s): Referred to Alternative Service(s):   Place:   Date:   Time:    Referred to Alternative Service(s):   Place:   Date:   Time:    Referred to Alternative Service(s):   Place:   Date:   Time:    Referred to Alternative Service(s):   Place:   Date:   Time:     Dava Najjar, [place], [facility], NCC

## 2023-10-24 NOTE — ED Notes (Addendum)
Patient admitted adult obs bed 1 for medication management r/t schizophrenia. Patient is A&O x 2 with a fixed smile affect and labile mood.Patient breaks out in laughter intermittently. Patient denies SI, HI, AVH but admits that he did think about cutting his throat yesterday with a knife. Patient states he did not go through with it because of GOD. Patient observed pacing and dancing in the milieu quietly. Patient received Haldol Decanoate 100 mg IM into RGM, tolerated procedure well. Patient was compliant with consuming PO ativan 2 mg. Patient appears to be experiencing periods of euphoria, no actual threat to self or others at this time. Patients head is bald and observed to have red patches throughout the surface and a old scar below 2nd digit (L) foot.

## 2023-10-24 NOTE — ED Provider Notes (Signed)
[facility] Continuous Assessment Admission H&P  Date: 10/24/23 Patient Name: Mario Thomas MRN: [id number] Chief Complaint: " I do not like medicine it has side effects"  Diagnoses:  Final diagnoses:  Schizoaffective disorder, bipolar type (HCC)  Tinea barbae and tinea capitis   HPI: Mario Thomas 22 y.o., male patient presented to [facility] as a walk in voluntarily unaccompanied, transported by police.  Patient reports that he was picked up from the [employer] office as he states " I was going to work".  Mario Thomas, 22 y.o., male patient seen face to face by this provider, consulted with Dr. Lucianne Muss; and chart reviewed on 10/24/23.    On evaluation Mario Thomas reports that he was discharged from [facility] 4 days ago and has been off of his medications since being discharged from the inpatient psychiatric unit at the hospital.  Has an extensive history of poor medication compliance and has been encouraged on several occasions to transition to a long-acting injectable antipsychotic to improve quality of life and to reduce exacerbations of psychosis.  Discussed this at length with patient on evaluation and he is now willing to try the Haldol decanoate which is a every 28-30-day injection.  Patient also has a rash circling the top of his head.  Patient denies that his itching endorses that he has a few other similar rashes on his leg.  Patient appears to recently have gotten a haircut scalp of head appears inflamed.  During evaluation Mario Thomas is sitting in no acute distress.  The patient is alert, oriented x 4, hyperactive, cooperative and inattentive at times during interview.  His mood remains labile from euphoric to euthymic  with congruent affect.  He has has normal speech with bizarre behaviors is consistent with his presentation at baseline. Objectively there is no evidence of psychosis/mania or delusional thinking.  Objectively patient does not appear to be responding to internal or external  stimuli.  Patient is able to converse coherently, goal directed thoughts, no distractibility, or pre-occupation.  Patient endorses hearing voices which again is consistent with his baseline presentation although he endorses that the voices are not commanding him to do anything negative and are not frightening to him.  Patient denies suicidal/self-harm/homicidal ideation.  Patient appears to be almost at baseline however is at high risk of decompensation due to history of poor medication compliance and recurrent episodes of psychosis.  Does not meet inpatient psychiatric criteria however will admit to the unit overnight as patient has agreed to receive a LAI dose of Haldol Decanoate.  Patient will likely discharge tomorrow unless any unforeseen circumstances occur overnight.  Total Time spent with patient: 45 minutes  Musculoskeletal  Strength & Muscle Tone: within normal limits Gait & Station: normal Patient leans: N/A  Psychiatric Specialty Exam  Presentation General Appearance:  Fairly Groomed  Eye Contact: Fair  Speech: Clear and Coherent  Speech Volume: Increased  Handedness: Right   Mood and Affect  Mood: Labile; Euphoric  Affect: Labile   Thought Process  Thought Processes: Irrevelant  Descriptions of Associations:Tangential  Orientation:Full (Time, Place and Person)  Thought Content:Illogical; Logical; Scattered  Diagnosis of Schizophrenia or Schizoaffective disorder in past: Yes  Duration of Psychotic Symptoms: Greater than six months  Hallucinations:Hallucinations: Auditory Description of Auditory Hallucinations: Just hearing voices in my head. Patient denies that the voices are instructing him to do anything.  Ideas of Reference:Delusions  Suicidal Thoughts:Suicidal Thoughts: Yes, Passive SI Passive Intent and/or Plan: Without Intent; Without Plan  Homicidal  Thoughts:Homicidal Thoughts: No   Sensorium  Memory: Immediate Fair; Recent  Fair  Judgment: Fair  Insight: Fair   Art therapist  Concentration: Fair  Attention Span: Fair  Recall: Good  Fund of Knowledge: Good  Language: Good   Psychomotor Activity  Psychomotor Activity: Psychomotor Activity: Restlessness   Assets  Assets: Communication Skills; Desire for Improvement; Resilience; Physical Health   Sleep  Sleep: Sleep: Poor Number of Hours of Sleep: 0 (sleeps some)   Nutritional Assessment (For OBS and FBC admissions only) Has the patient had a weight loss or gain of 10 pounds or more in the last 3 months?: No Has the patient had a decrease in food intake/or appetite?: No Does the patient have dental problems?: No Does the patient have eating habits or behaviors that may be indicators of an eating disorder including binging or inducing vomiting?: No Has the patient recently lost weight without trying?: 0 Has the patient been eating poorly because of a decreased appetite?: 0 Malnutrition Screening Tool Score: 0   Physical Exam Vitals reviewed.  HENT:     Head: Normocephalic.     Comments: Scaly rash on scalp    Nose: Nose normal.  Eyes:     Extraocular Movements: Extraocular movements intact.     Pupils: Pupils are equal, round, and reactive to light.  Cardiovascular:     Rate and Rhythm: Normal rate and regular rhythm.  Pulmonary:     Effort: Pulmonary effort is normal.     Breath sounds: Normal breath sounds.  Musculoskeletal:        General: Normal range of motion.     Cervical back: Normal range of motion.  Skin:    Findings: Rash present.  Neurological:     General: No focal deficit present.     Mental Status: He is alert.    Review of Systems  Psychiatric/Behavioral:  Positive for hallucinations. The patient is nervous/anxious.     Blood pressure (!) 151/96, pulse 65, temperature 98.1 F (36.7 C), temperature source Oral, resp. rate 18, SpO2 100%. There is no height or weight on file to calculate  BMI.  Past Psychiatric History:    Is the patient at risk to self? No  Has the patient been a risk to self in the past 6 months? No .    Has the patient been a risk to self within the distant past? No   Is the patient a risk to others? No   Has the patient been a risk to others in the past 6 months? No   Has the patient been a risk to others within the distant past? No   Past Medical History: No pertinent medical history reported during admission  Family History: No pertinent family medical history of psychiatric history reported during admission  Social History: Patient endorses that he lives with his parents.  Last Labs:  Admission on 10/24/2023  Component Date Value Ref Range Status   POC Amphetamine UR 10/24/2023 None Detected  NONE DETECTED (Cut Off Level 1000 ng/mL) Final   POC Secobarbital (BAR) 10/24/2023 None Detected  NONE DETECTED (Cut Off Level 300 ng/mL) Final   POC Buprenorphine (BUP) 10/24/2023 None Detected  NONE DETECTED (Cut Off Level 10 ng/mL) Final   POC Oxazepam (BZO) 10/24/2023 None Detected  NONE DETECTED (Cut Off Level 300 ng/mL) Final   POC Cocaine UR 10/24/2023 None Detected  NONE DETECTED (Cut Off Level 300 ng/mL) Final   POC Methamphetamine UR 10/24/2023 None Detected  NONE DETECTED (  Cut Off Level 1000 ng/mL) Final   POC Morphine 10/24/2023 None Detected  NONE DETECTED (Cut Off Level 300 ng/mL) Final   POC Methadone UR 10/24/2023 None Detected  NONE DETECTED (Cut Off Level 300 ng/mL) Final   POC Oxycodone UR 10/24/2023 None Detected  NONE DETECTED (Cut Off Level 100 ng/mL) Final   POC Marijuana UR 10/24/2023 None Detected  NONE DETECTED (Cut Off Level 50 ng/mL) Final  Admission on 10/15/2023, Discharged on 10/15/2023  Component Date Value Ref Range Status   WBC 10/15/2023 7.0  4.0 - 10.5 K/uL Final   RBC 10/15/2023 5.29  4.22 - 5.81 MIL/uL Final   Hemoglobin 10/15/2023 15.6  13.0 - 17.0 g/dL Final   HCT 57/84/6962 47.5  39.0 - 52.0 % Final   MCV  10/15/2023 89.8  80.0 - 100.0 fL Final   MCH 10/15/2023 29.5  26.0 - 34.0 pg Final   MCHC 10/15/2023 32.8  30.0 - 36.0 g/dL Final   RDW 95/28/4132 13.8  11.5 - 15.5 % Final   Platelets 10/15/2023 211  150 - 400 K/uL Final   nRBC 10/15/2023 0.0  0.0 - 0.2 % Final   Neutrophils Relative % 10/15/2023 58  % Final   Neutro Abs 10/15/2023 4.1  1.7 - 7.7 K/uL Final   Lymphocytes Relative 10/15/2023 30  % Final   Lymphs Abs 10/15/2023 2.1  0.7 - 4.0 K/uL Final   Monocytes Relative 10/15/2023 8  % Final   Monocytes Absolute 10/15/2023 0.6  0.1 - 1.0 K/uL Final   Eosinophils Relative 10/15/2023 3  % Final   Eosinophils Absolute 10/15/2023 0.2  0.0 - 0.5 K/uL Final   Basophils Relative 10/15/2023 1  % Final   Basophils Absolute 10/15/2023 0.0  0.0 - 0.1 K/uL Final   Immature Granulocytes 10/15/2023 0  % Final   Abs Immature Granulocytes 10/15/2023 0.02  0.00 - 0.07 K/uL Final   Performed at [facility], [address], [place], [place] [address]   Sodium 10/15/2023 139  135 - 145 mmol/L Final   Potassium 10/15/2023 4.1  3.5 - 5.1 mmol/L Final   Chloride 10/15/2023 101  98 - 111 mmol/L Final   CO2 10/15/2023 27  22 - 32 mmol/L Final   Glucose, Bld 10/15/2023 79  70 - 99 mg/dL Final   Glucose reference range applies only to samples taken after fasting for at least 8 hours.   BUN 10/15/2023 14  6 - 20 mg/dL Final   Creatinine, Ser 10/15/2023 0.63  0.61 - 1.24 mg/dL Final   Calcium 27/25/3664 9.7  8.9 - 10.3 mg/dL Final   Total Protein 40/34/7425 6.9  6.5 - 8.1 g/dL Final   Albumin 95/63/8756 4.3  3.5 - 5.0 g/dL Final   AST 43/32/9518 18  15 - 41 U/L Final   ALT 10/15/2023 16  0 - 44 U/L Final   Alkaline Phosphatase 10/15/2023 90  38 - 126 U/L Final   Total Bilirubin 10/15/2023 0.6  <1.2 mg/dL Final   GFR, Estimated 10/15/2023 >60  >60 mL/min Final   Comment: (NOTE) Calculated using the CKD-EPI Creatinine Equation (2021)    Anion gap 10/15/2023 11  5 - 15 Final   Performed at [facility], [address] [place], [place] [address]   Hgb A1c MFr Bld 10/15/2023 4.8  4.8 - 5.6 % Final   Comment: (NOTE) Pre diabetes:          5.7%-6.4%  Diabetes:              >  6.4%  Glycemic control for   <7.0% adults with diabetes    Mean Plasma Glucose 10/15/2023 91.06  mg/dL Final   Performed at [facility], [address], [place], [place] [address]   Cholesterol 10/15/2023 125  0 - 200 mg/dL Final   Triglycerides 13/24/4010 56  <150 mg/dL Final   HDL 27/25/3664 47  >40 mg/dL Final   Total CHOL/HDL Ratio 10/15/2023 2.7  RATIO Final   VLDL 10/15/2023 11  0 - 40 mg/dL Final   LDL Cholesterol 10/15/2023 67  0 - 99 mg/dL Final   Comment:        Total Cholesterol/HDL:CHD Risk Coronary Heart Disease Risk Table                     Men   Women  1/2 Average Risk   3.4   3.3  Average Risk       5.0   4.4  2 X Average Risk   9.6   7.1  3 X Average Risk  23.4   11.0        Use the calculated Patient Ratio above and the CHD Risk Table to determine the patient's CHD Risk.        ATP III CLASSIFICATION (LDL):  <100     mg/dL   Optimal  403-474  mg/dL   Near or Above                    Optimal  130-159  mg/dL   Borderline  259-563  mg/dL   High  >875     mg/dL   Very High Performed at [facility], [address] [place], [place] [address]    TSH 10/15/2023 4.190  0.350 - 4.500 uIU/mL Final   Comment: Performed by a 3rd Generation assay with a functional sensitivity of <=0.01 uIU/mL. Performed at [facility], [address] [place], [place] [address]    Alcohol, Ethyl (B) 10/15/2023 <10  <10 mg/dL Final   Comment: (NOTE) Lowest detectable limit for serum alcohol is 10 mg/dL.  For medical purposes only. Performed at [facility], [address] [place], [place] [address]    POC Amphetamine UR 10/15/2023 None Detected  NONE DETECTED (Cut Off Level 1000 ng/mL) Final   POC Secobarbital (BAR) 10/15/2023 None Detected  NONE DETECTED (Cut Off  Level 300 ng/mL) Final   POC Buprenorphine (BUP) 10/15/2023 None Detected  NONE DETECTED (Cut Off Level 10 ng/mL) Final   POC Oxazepam (BZO) 10/15/2023 None Detected  NONE DETECTED (Cut Off Level 300 ng/mL) Final   POC Cocaine UR 10/15/2023 None Detected  NONE DETECTED (Cut Off Level 300 ng/mL) Final   POC Methamphetamine UR 10/15/2023 None Detected  NONE DETECTED (Cut Off Level 1000 ng/mL) Final   POC Morphine 10/15/2023 None Detected  NONE DETECTED (Cut Off Level 300 ng/mL) Final   POC Methadone UR 10/15/2023 None Detected  NONE DETECTED (Cut Off Level 300 ng/mL) Final   POC Oxycodone UR 10/15/2023 None Detected  NONE DETECTED (Cut Off Level 100 ng/mL) Final   POC Marijuana UR 10/15/2023 None Detected  NONE DETECTED (Cut Off Level 50 ng/mL) Final  Admission on 05/17/2023, Discharged on 05/18/2023  Component Date Value Ref Range Status   Sodium 05/17/2023 139  135 - 145 mmol/L Final   Potassium 05/17/2023 4.3  3.5 - 5.1 mmol/L Final   Chloride 05/17/2023 103  98 - 111 mmol/L Final  CO2 05/17/2023 26  22 - 32 mmol/L Final   Glucose, Bld 05/17/2023 98  70 - 99 mg/dL Final   Glucose reference range applies only to samples taken after fasting for at least 8 hours.   BUN 05/17/2023 12  6 - 20 mg/dL Final   Creatinine, Ser 05/17/2023 0.89  0.61 - 1.24 mg/dL Final   Calcium 28/41/3244 9.7  8.9 - 10.3 mg/dL Final   GFR, Estimated 05/17/2023 >60  >60 mL/min Final   Comment: (NOTE) Calculated using the CKD-EPI Creatinine Equation (2021)    Anion gap 05/17/2023 10  5 - 15 Final   Performed at [facility], [address], [place], [place] [address]   WBC 05/17/2023 7.4  4.0 - 10.5 K/uL Final   RBC 05/17/2023 5.16  4.22 - 5.81 MIL/uL Final   Hemoglobin 05/17/2023 15.4  13.0 - 17.0 g/dL Final   HCT 25/36/6440 46.6  39.0 - 52.0 % Final   MCV 05/17/2023 90.3  80.0 - 100.0 fL Final   MCH 05/17/2023 29.8  26.0 - 34.0 pg Final   MCHC 05/17/2023 33.0  30.0 - 36.0 g/dL Final   RDW 34/74/2595  12.4  11.5 - 15.5 % Final   Platelets 05/17/2023 193  150 - 400 K/uL Final   nRBC 05/17/2023 0.0  0.0 - 0.2 % Final   Performed at [facility], [address] [place], [place] [address]   Troponin I (High Sensitivity) 05/17/2023 <2  <18 ng/L Final   Comment: (NOTE) Elevated high sensitivity troponin I (hsTnI) values and significant  changes across serial measurements may suggest ACS but many other  chronic and acute conditions are known to elevate hsTnI results.  Refer to the "Links" section for chest pain algorithms and additional  guidance. Performed at [facility], [address] [place], [place] [address]    Troponin I (High Sensitivity) 05/17/2023 3  <18 ng/L Final   Comment: (NOTE) Elevated high sensitivity troponin I (hsTnI) values and significant  changes across serial measurements may suggest ACS but many other  chronic and acute conditions are known to elevate hsTnI results.  Refer to the "Links" section for chest pain algorithms and additional  guidance. Performed at [facility], [address], [place], [place] [address]    D-Dimer, Quant 05/18/2023 <0.27  0.00 - 0.50 ug/mL-FEU Final   Comment: (NOTE) At the manufacturer cut-off value of 0.5 g/mL FEU, this assay has a negative predictive value of 95-100%.This assay is intended for use in conjunction with a clinical pretest probability (PTP) assessment model to exclude pulmonary embolism (PE) and deep venous thrombosis (DVT) in outpatients suspected of PE or DVT. Results should be correlated with clinical presentation. Performed at [facility], [address] [place], [place] [address]     Allergies: Patient has no known allergies.  Medications:  Facility Ordered Medications  Medication   acetaminophen (TYLENOL) tablet 650 mg   alum & mag hydroxide-simeth (MAALOX/MYLANTA) 200-200-20 MG/5ML suspension 30 mL   magnesium hydroxide (MILK OF MAGNESIA) suspension 30 mL   [COMPLETED]  haloperidol decanoate (HALDOL DECANOATE) 100 MG/ML injection 100 mg   [COMPLETED] LORazepam (ATIVAN) tablet 2 mg   ziprasidone (GEODON) injection 20 mg   And   LORazepam (ATIVAN) tablet 1 mg   haloperidol (HALDOL) tablet 2 mg   [START ON 10/25/2023] benztropine (COGENTIN) tablet 2 mg   divalproex (DEPAKOTE) DR tablet 250 mg   traZODone (DESYREL) tablet 50 mg  fluconazole (DIFLUCAN) tablet 200 mg   PTA Medications  Medication Sig   benztropine (COGENTIN) 2 MG tablet Take 1 tablet (2 mg total) by mouth daily.   divalproex (DEPAKOTE) 250 MG DR tablet Take 1 tablet (250 mg total) by mouth every 12 (twelve) hours.   haloperidol (HALDOL) 2 MG tablet Take 1 tablet (2 mg total) by mouth 2 (two) times daily for 15 days.   traZODone (DESYREL) 50 MG tablet Take 1 tablet (50 mg total) by mouth at bedtime.      Medical Decision Making  Schizoaffective disorder with disorganization related to noncompliance with medication.  Gust at length with the patient the importance of transitioning to a long acting injection.  I have seen patient previously on prior encounters and patient has been very oppositional to receiving the [place] injection.  We discussed today the option of doing a LAI of Haldol decanoate to be reduced every 28 to 30 days.  Patient was in agreement with this option.  Patient will remain on continuous assessment unit overnight due to med management, continue oral Haldol which I reduced from 10 mg daily to 4 mg daily for a period of 15 days.  Patient received Haldol decanoate injection on admission without any complications.  Unless unforeseen events occur overnight patient can be discharged back home tomorrow.    1. Schizoaffective disorder, bipolar type (HCC) - Full code; Standing - Diet regular Room service appropriate? Yes; Fluid consistency: Thin; Standing - acetaminophen (TYLENOL) tablet 650 mg - alum & mag hydroxide-simeth (MAALOX/MYLANTA) 200-200-20 MG/5ML suspension 30  mL - magnesium hydroxide (MILK OF MAGNESIA) suspension 30 mL - Place in continuous assessment [facility]; Standing - Voluntary Treatment; Standing - EKG; Standing - POCT Urine Drug Screen - (I-Screen); Standing - Full code - Diet regular Room service appropriate? Yes; Fluid consistency: Thin - Place in continuous assessment [facility] - Voluntary Treatment - EKG - POCT Urine Drug Screen - (I-Screen) - haloperidol decanoate (HALDOL DECANOATE) 100 MG/ML injection 100 mg - LORazepam (ATIVAN) tablet 2 mg - ziprasidone (GEODON) injection 20 mg - LORazepam (ATIVAN) tablet 1 mg - haloperidol (HALDOL) tablet 2 mg - benztropine (COGENTIN) tablet 2 mg - divalproex (DEPAKOTE) DR tablet 250 mg - traZODone (DESYREL) tablet 50 mg - benztropine (COGENTIN) 2 MG tablet; Take 1 tablet (2 mg total) by mouth daily.  Dispense: 30 tablet; Refill: 0  2. Tinea barbae and tinea capitis  -Diflucan 200 mg once, may repeat in 2 weeks if needed. Recommendations  Based on my evaluation the patient does not appear to have an emergency medical condition.  Joaquin Courts, NP 10/24/23  5:14 PM

## 2023-10-25 DIAGNOSIS — B350 Tinea barbae and tinea capitis: Secondary | ICD-10-CM

## 2023-10-25 DIAGNOSIS — F250 Schizoaffective disorder, bipolar type: Secondary | ICD-10-CM | POA: Diagnosis not present

## 2023-10-25 NOTE — ED Notes (Signed)
Pt is currently sleeping, no distress noted, environmental check complete, will continue to monitor patient for safety.  

## 2023-10-25 NOTE — ED Notes (Signed)
Patient discharged in no acute distress. He denies SI or HI at the time of discharge. Belongs returned to patient from locker #13. Patient escorted to the front lobby where his parents were waiting. AVS reviewed with patient independently as well as patient's mother. Patient's mother stated that pt did not need to take the PO Haldol since pt had already received the injection. I reiterated the instructions for patient to take Haldol as prescribed for 15 days. Understanding voiced.

## 2023-10-25 NOTE — ED Provider Notes (Signed)
FBC/OBS ASAP Discharge Summary  Date and Time: 10/25/2023 9:25 AM  Name: Mario Thomas  MRN:  [id number]   Discharge Diagnoses:  Final diagnoses:  Schizoaffective disorder, bipolar type (HCC)  Tinea barbae and tinea capitis    Subjective: Patient seen and evaluated face-to-face by this provider, chart reviewed and case discussed with Dr. Lucianne Muss. On evaluation, patient is alert and oriented x 4. His thought process is linear and speech is clear and coherent. His mood is anxious and affect is congruent. He denies SI/HI/AVH. There is no objective evidence that the patient is currently responding to internal or external stimuli, or experiencing paranoia or delusional thought content. He is cooperative and does not appear to be in acute distress and does not exhibit any aggressive, disruptive, or self harm behaviors. Patient states that he was told that he could go home today if he got the injection yesterday. Patient was reassured that he would be discharging home today. He denies medication side effect. He denies physical complaints. He gives verbal permission for this provider to speak with his mother Mario Thomas [phone]. Before I could contact the patient's mother, his parents had arrived to the facility to pick him up.  I spoke to the patient's mother face-to-face to safety plan and to inform his mother of new medication changes and outpatient recommendations for treatment. Patient stable to discharge.   Stay Summary: Mario Thomas is a 22 year old male patient with a past psychiatric history significant for schizoaffective disorder, bipolar type who presented to the [facility] behavioral health urgent care by law enforcement on 10/24/23. Patient was noted to be hyperactive, labile, disorganized and exhibiting bizarre behaviors on arrival. Patient was recently discharged from [facility] 4 days prior and has been noncompliant with medication regimen. Patient was admitted to the continuous  assessment unit for overnight observation, medication management and mood stabilization. Patient was administered Haldol decanoate 100 mg IM, next dose due on 11/19/2023, continued Depakote 250 mg every 12 hours, continued Cogentin 2 mg daily, and Haldol oral was decreased to 2 mg po BID x 15 days. A referral was made to the [facility] in [place] for medication management.  Total Time spent with patient: 20 minutes  Past Psychiatric History: history of schizoaffective bipolar type. Inpatient at [facility] on 10/15/23.  Past Medical History: No history reported.   Family History: No history reported.   Family Psychiatric History: No history reported.  Social History: Patient resides with his mother and father.   Tobacco Cessation:  Prescription not provided because: declined  Current Medications:  Current Facility-Administered Medications  Medication Dose Route Frequency Provider Last Rate Last Admin   acetaminophen (TYLENOL) tablet 650 mg  650 mg Oral Q6H PRN Bing Neighbors, NP       alum & mag hydroxide-simeth (MAALOX/MYLANTA) 200-200-20 MG/5ML suspension 30 mL  30 mL Oral Q4H PRN Bing Neighbors, NP       benztropine (COGENTIN) tablet 2 mg  2 mg Oral Daily Bing Neighbors, NP   2 mg at 10/25/23 0900   divalproex (DEPAKOTE) DR tablet 250 mg  250 mg Oral Q12H Bing Neighbors, NP   250 mg at 10/25/23 0900   haloperidol (HALDOL) tablet 2 mg  2 mg Oral BID Bing Neighbors, NP   2 mg at 10/25/23 0900   ziprasidone (GEODON) injection 20 mg  20 mg Intramuscular Q12H PRN Bing Neighbors, NP       And   LORazepam (ATIVAN)  tablet 1 mg  1 mg Oral PRN Bing Neighbors, NP       magnesium hydroxide (MILK OF MAGNESIA) suspension 30 mL  30 mL Oral Daily PRN Bing Neighbors, NP       Current Outpatient Medications  Medication Sig Dispense Refill   benztropine (COGENTIN) 2 MG tablet Take 1 tablet (2 mg total) by mouth daily. 30 tablet 0   divalproex  (DEPAKOTE) 250 MG DR tablet Take 1 tablet (250 mg total) by mouth every 12 (twelve) hours. 30 tablet 0   haloperidol (HALDOL) 2 MG tablet Take 1 tablet (2 mg total) by mouth 2 (two) times daily for 15 days. 30 tablet 0   traZODone (DESYREL) 50 MG tablet Take 1 tablet (50 mg total) by mouth at bedtime. 30 tablet 0    PTA Medications:  Facility Ordered Medications  Medication   acetaminophen (TYLENOL) tablet 650 mg   alum & mag hydroxide-simeth (MAALOX/MYLANTA) 200-200-20 MG/5ML suspension 30 mL   magnesium hydroxide (MILK OF MAGNESIA) suspension 30 mL   [COMPLETED] haloperidol decanoate (HALDOL DECANOATE) 100 MG/ML injection 100 mg   [COMPLETED] LORazepam (ATIVAN) tablet 2 mg   ziprasidone (GEODON) injection 20 mg   And   LORazepam (ATIVAN) tablet 1 mg   haloperidol (HALDOL) tablet 2 mg   benztropine (COGENTIN) tablet 2 mg   divalproex (DEPAKOTE) DR tablet 250 mg   [COMPLETED] traZODone (DESYREL) tablet 50 mg   [COMPLETED] fluconazole (DIFLUCAN) tablet 200 mg       11/14/2022    6:55 PM 11/27/2019    9:41 AM 08/04/2018    9:03 AM  Depression screen PHQ 2/9  Decreased Interest -- 0 0  Down, Depressed, Hopeless -- 0 0  PHQ - 2 Score  0 0    Flowsheet Row ED from 10/24/2023 in [facility] ED from 10/15/2023 in [facility] ED from 05/17/2023 in [facility] Emergency Department at [facility]  C-SSRS RISK CATEGORY High Risk Moderate Risk No Risk       Musculoskeletal  Strength & Muscle Tone: within normal limits Gait & Station: normal Patient leans: N/A  Psychiatric Specialty Exam  Presentation  General Appearance:  Appropriate for Environment  Eye Contact: Fair  Speech: Clear and Coherent  Speech Volume: Normal  Handedness: Right   Mood and Affect  Mood: Anxious  Affect: Congruent   Thought Process  Thought Processes: Coherent  Descriptions of Associations:Intact  Orientation:Full  (Time, Place and Person)  Thought Content:Logical  Diagnosis of Schizophrenia or Schizoaffective disorder in past: Yes  Duration of Psychotic Symptoms: Greater than six months   Hallucinations:None.  Ideas of Reference:None  Suicidal Thoughts:Suicidal Thoughts: No  Homicidal Thoughts:Homicidal Thoughts: No   Sensorium  Memory: Immediate Fair; Recent Fair; Remote Fair  Judgment: Fair  Insight: Fair   Chartered certified accountant: Fair  Attention Span: Fair  Recall: [employer] of Knowledge: Fair  Language: Fair   Psychomotor Activity  Psychomotor Activity: Psychomotor Activity: Normal   Assets  Assets: Manufacturing systems engineer; Desire for Improvement; Financial Resources/Insurance; Housing; Leisure Time; Physical Health   Sleep  Sleep: Sleep: Fair Number of Hours of Sleep: 0 (sleeps some)   Nutritional Assessment (For OBS and FBC admissions only) Has the patient had a weight loss or gain of 10 pounds or more in the last 3 months?: No Has the patient had a decrease in food intake/or appetite?: No Does the patient have dental problems?: No Does the patient  have eating habits or behaviors that may be indicators of an eating disorder including binging or inducing vomiting?: No Has the patient recently lost weight without trying?: 0 Has the patient been eating poorly because of a decreased appetite?: 0 Malnutrition Screening Tool Score: 0    Physical Exam  Physical Exam HENT:     Head: Normocephalic.     Nose: Nose normal.  Eyes:     Conjunctiva/sclera: Conjunctivae normal.  Cardiovascular:     Rate and Rhythm: Normal rate.  Pulmonary:     Effort: Pulmonary effort is normal.  Musculoskeletal:     Cervical back: Normal range of motion.  Neurological:     Mental Status: He is alert and oriented to person, place, and time.    Review of Systems  Constitutional: Negative.   HENT: Negative.    Eyes: Negative.   Respiratory: Negative.     Cardiovascular: Negative.   Gastrointestinal: Negative.   Genitourinary: Negative.   Musculoskeletal: Negative.   Neurological: Negative.   Endo/Heme/Allergies: Negative.    Blood pressure 129/78, pulse 74, temperature 98.6 F (37 C), temperature source Oral, resp. rate 19, SpO2 100%. There is no height or weight on file to calculate BMI.  Demographic Factors:  Male and Adolescent or young adult  Loss Factors: NA  Historical Factors: Impulsivity  Risk Reduction Factors:   Sense of responsibility to family, Living with another person, especially a relative, and Positive social support  Continued Clinical Symptoms:  Previous Psychiatric Diagnoses and Treatments  Cognitive Features That Contribute To Risk:  None    Suicide Risk:  Minimal: No identifiable suicidal ideation.  Patients presenting with no risk factors but with morbid ruminations; may be classified as minimal risk based on the severity of the depressive symptoms  Plan Of Care/Follow-up recommendations:   You have been referred to [facility] in [place]. Your next Haldol injection is scheduled for 11/19/2023 28 days from administration day. Outpatient will administer your next injection. Please call to confirm scheduled appointment and the time. Continue Haldol 2 mg twice daily for 15 days then discontinue. All other medications will remain unchanged  [facility] [address], [place], [place] [address] Phone: [phone]  Disposition: Discharge.  Ouida Abeyta L, NP 10/25/2023, 9:25 AM

## 2023-10-25 NOTE — ED Notes (Signed)
Patient states he was told if he took "the injection, he would get to leave today". I advised patient I was not made aware of his discharge during report however I will relay the information to the first shift sot hat they can speak with the day shift provider who possibly told him that. I advised the patient that he would not be able to be discharged tonight.

## 2023-10-25 NOTE — ED Notes (Signed)
Patient at phone. Restless and wanting to speak to a provider. Informed patient providers will be making rounds around 0900. No physical complaints at this time. Patient in no apparent acute distress. Environment secured. Safety checks in place per facility protocol.

## 2023-10-25 NOTE — ED Notes (Signed)
Patient is pacing the unit, singing loudly.

## 2023-10-25 NOTE — ED Notes (Signed)
Patient is visibly upset about being "Locked up in a room". He says he is going to sing, I advised that because there is another patient on the unit with him that may not be best, patient starts singing anyways.

## 2023-11-07 ENCOUNTER — Ambulatory Visit (HOSPITAL_COMMUNITY)
Admission: EM | Admit: 2023-11-07 | Discharge: 2023-11-07 | Disposition: A | Discharge status: Home / Self Care | Payer: BC Managed Care – PPO

## 2023-11-07 DIAGNOSIS — F250 Schizoaffective disorder, bipolar type: Secondary | ICD-10-CM | POA: Diagnosis not present

## 2023-11-07 NOTE — ED Provider Notes (Signed)
Behavioral Health Urgent Care Medical Screening Exam  Patient Name: Mario Thomas MRN: [id number] Date of Evaluation: 11/07/23 Chief Complaint:   Diagnosis:  Final diagnoses:  Schizoaffective disorder, bipolar type (HCC)    History of Present illness: Mario Thomas is a 22 y.o. male with a past psychiatric history significant for schizoaffective disorder (bipolar type) who presents to [facility] by way of [employer] with no specific chief complaint.  Patient reports that he was brought in by [employer] police.  Prior to presenting to this facility, patient states that he was previously hanging out with his sister.  He states that before arriving to [employer], he was listening to music in his car.  Shortly before presenting to [employer] police station, patient reports that he spiraled out of control.  He states that he went to the police station to seek help regarding a no contact order that was placed on him due to harassing the girl.  After presenting to [employer] release station, patient states that his keys were confiscated and he was taken over to [facility] for an assessment.  Patient reports that he is currently taking Haldol LAI as well as trazodone.  He reports that he normally does not take his medications due to experiencing side effects from them.  Patient endorses depression and rates his depression at 1 out of 10.  Patient endorses depressive episodes every day.  Patient endorses the following depressive symptoms: lack of motivation, decreased concentration, decreased energy (patient reports that he experiences decreased energy 97% of the time), and irritability (patient rates his irritability as 7.9 out of 10).  In addition to depression, patient endorses anxiety with his anxiety a 5.3 out of 6.  Patient denies any new stressors at this time.  Patient is alert and oriented x 4, calm, cooperative, and fully engaged in conversation during the encounter.  Patient maintains good eye contact.   Patient's speech is clear, coherent, and with normal rate.  Patient's thought content is logical.  Patient's thought process is coherent.  Patient endorses depressed mood and anxiety but exhibits appropriate affect.  Patient denies suicidal or homicidal ideations.  He further denies auditory or visual hallucinations and does not appear to be responding to internal/external stimuli.  Patient endorses poor sleep and receives on average 4-1/2 sleep per night.  Patient endorses good appetite and eats on average 3 meals per day.  Patient endorses alcohol consumption stating that he last drank a few hours ago when hanging out with his sister and her friends.  Patient denies tobacco use but states that he last engaged in vaping a week ago.  Patient denies illicit drug use.  Flowsheet Row ED from 10/24/2023 in [facility] ED from 10/15/2023 in [facility] ED from 05/17/2023 in [facility] Emergency Department at [facility]  C-SSRS RISK CATEGORY High Risk Moderate Risk No Risk       Psychiatric Specialty Exam  Presentation  General Appearance:Appropriate for Environment; Bizarre  Eye Contact:Good  Speech:Clear and Coherent; Normal Rate  Speech Volume:Normal  Handedness:Right   Mood and Affect  Mood: Depressed; Anxious  Affect: Appropriate   Thought Process  Thought Processes: Coherent  Descriptions of Associations:Intact  Orientation:Full (Time, Place and Person)  Thought Content:Logical  Diagnosis of Schizophrenia or Schizoaffective disorder in past: Yes  Duration of Psychotic Symptoms: Greater than six months  Hallucinations:None None  Ideas of Reference:None  Suicidal Thoughts:No (Patient did express wanting to engage in self-harm) Without  Intent; Without Plan  Homicidal Thoughts:No (Patient reported wanted to cause harm emotionally)   Sensorium  Memory: Immediate Fair; Recent Fair; Remote  Fair  Judgment: Fair  Insight: Fair   Chartered certified accountant: Fair  Attention Span: Fair  Recall: [employer] of Knowledge: Fair  Language: Fair   Psychomotor Activity  Psychomotor Activity: Normal   Assets  Assets: Manufacturing systems engineer; Desire for Improvement; Financial Resources/Insurance; Housing; Physical Health   Sleep  Sleep: Poor  Number of hours:  4.5   Physical Exam: Physical Exam Constitutional:      Appearance: Normal appearance.  HENT:     Head: Normocephalic and atraumatic.     Nose: Nose normal.     Mouth/Throat:     Mouth: Mucous membranes are moist.  Eyes:     Extraocular Movements: Extraocular movements intact.  Cardiovascular:     Rate and Rhythm: Normal rate and regular rhythm.  Pulmonary:     Effort: Pulmonary effort is normal.  Abdominal:     General: Abdomen is flat.  Musculoskeletal:        General: Normal range of motion.     Cervical back: Normal range of motion.  Neurological:     General: No focal deficit present.     Mental Status: He is alert.  Psychiatric:        Attention and Perception: Attention and perception normal. He does not perceive auditory or visual hallucinations.        Mood and Affect: Affect normal. Mood is anxious and depressed.        Speech: Speech normal.        Behavior: Behavior normal. Behavior is cooperative.        Thought Content: Thought content normal. Thought content is not paranoid or delusional. Thought content does not include homicidal or suicidal ideation. Thought content does not include suicidal plan.        Cognition and Memory: Cognition and memory normal.        Judgment: Judgment is inappropriate.    Review of Systems  Constitutional: Negative.   HENT: Negative.    Eyes: Negative.   Respiratory: Negative.    Cardiovascular: Negative.   Gastrointestinal: Negative.   Musculoskeletal: Negative.   Skin: Negative.   Psychiatric/Behavioral:  Positive for  depression. Negative for hallucinations, substance abuse and suicidal ideas. The patient is nervous/anxious. The patient does not have insomnia.    Blood pressure 134/79, pulse 63, temperature 98.4 F (36.9 C), temperature source Oral, resp. rate 17, SpO2 100%. There is no height or weight on file to calculate BMI.  Musculoskeletal: Strength & Muscle Tone: within normal limits Gait & Station: normal Patient leans: N/A   [facility] MSE Discharge Disposition for Follow up and Recommendations: Based on my evaluation the patient does not appear to have an emergency medical condition and can be discharged with resources and follow up care in outpatient services for Medication Management.  Meta Hatchet, PA 11/07/2023, 9:46 AM

## 2023-11-07 NOTE — Discharge Summary (Signed)
Mario Thomas to be D/C'd Home per NP order. Discussed with the patient and all questions fully answered. An After Visit Summary was printed and given to the patient. Patient escorted out and D/C home via private auto.  [name]  11/07/2023 9:18 AM

## 2023-11-07 NOTE — Progress Notes (Signed)
   11/07/23 0738  [facility] Triage Screening (Walk-ins at [facility] only)  How Did You Hear About [place]? Legal System  What Is the Reason for Your Visit/Call Today? Mario Thomas is a 22 year old male who presents to [facility] voluntarily escorted by [employer] police. Per the police the pt pulled up at their police station, got out of his car, asked for help and told them he did not want to go home. Pt reports he went out to a party lastnight with his sister and was drinking alcohol. Pt reports consuming "3 cups of alcohol". He denies any other substance use. He states he was out until early this morning "taking a drive". Pt states he drove around and somehow ended up at the [employer] police station. Pt states he wanted to come here so that he can get his medication changed and "get some Thanksgiving food". Pt states he was prescribed Haldol previously and feels like it has some side effects but cannot elaborate on his side effects from the medication. Pt informed that medication management is typically done in outpatient.Pt states he feels safe to return home and states he will follow-up with outpatient for his medication management. Pt denies SI/HI and AVH.  How Long Has This Been Causing You Problems? <Week  Have You Recently Had Any Thoughts About Hurting Yourself? No  Are You Planning to Commit Suicide/Harm Yourself At This time? No  Have you Recently Had Thoughts About Hurting Someone [name]? No  Are You Planning To Harm Someone At This Time? No  Explanation: reported seeing a "[employer] police car", but came to the conclusion that this was just a memory since he just got out of a [employer] police car.  Physical Abuse Denies  Verbal Abuse Denies  Sexual Abuse Denies  Exploitation of patient/patient's resources Denies  Self-Neglect Denies  Possible abuse reported to:  (none reported)  Are you currently experiencing any auditory, visual or other hallucinations? No  Please explain the hallucinations you are currently experiencing:  reported seeing a "[employer] police car", but came to the conclusion that this was just a memory since he just got out of a [employer] police car.  Have You Used Any Alcohol or Drugs in the Past 24 Hours? Yes  How long ago did you use Drugs or Alcohol? lastnight  What Did You Use and How Much? "3 cups of alcohol"  Do you have any current medical co-morbidities that require immediate attention? No  Clinician description of patient physical appearance/behavior: cooperative, well groomed, calm  What Do You Feel Would Help You the Most Today? Treatment for Depression or other mood problem;Medication(s)  If access to [facility] was not available, would you have sought care in the Emergency Department? No  Determination of Need Routine (7 days)  Options For Referral Outpatient Therapy;Medication Management

## 2023-11-08 ENCOUNTER — Ambulatory Visit (HOSPITAL_COMMUNITY)
Admission: EM | Admit: 2023-11-08 | Discharge: 2023-11-09 | Disposition: A | Discharge status: Other Acute Inpatient Hospital | Payer: BC Managed Care – PPO | Attending: Behavioral Health | Admitting: Behavioral Health

## 2023-11-08 ENCOUNTER — Emergency Department (HOSPITAL_COMMUNITY)
Admission: EM | Admit: 2023-11-08 | Discharge: 2023-11-08 | Disposition: A | Discharge status: Home / Self Care | Payer: BC Managed Care – PPO | Source: Home / Self Care | Attending: Emergency Medicine | Admitting: Emergency Medicine

## 2023-11-08 ENCOUNTER — Encounter (HOSPITAL_COMMUNITY): Payer: Self-pay

## 2023-11-08 ENCOUNTER — Other Ambulatory Visit: Payer: Self-pay

## 2023-11-08 DIAGNOSIS — Z91199 Patient's noncompliance with other medical treatment and regimen due to unspecified reason: Secondary | ICD-10-CM | POA: Diagnosis not present

## 2023-11-08 DIAGNOSIS — Z9151 Personal history of suicidal behavior: Secondary | ICD-10-CM | POA: Insufficient documentation

## 2023-11-08 DIAGNOSIS — R45851 Suicidal ideations: Secondary | ICD-10-CM | POA: Diagnosis not present

## 2023-11-08 DIAGNOSIS — R011 Cardiac murmur, unspecified: Secondary | ICD-10-CM | POA: Diagnosis not present

## 2023-11-08 DIAGNOSIS — F250 Schizoaffective disorder, bipolar type: Secondary | ICD-10-CM | POA: Insufficient documentation

## 2023-11-08 DIAGNOSIS — T434X6A Underdosing of butyrophenone and thiothixene neuroleptics, initial encounter: Secondary | ICD-10-CM | POA: Insufficient documentation

## 2023-11-08 DIAGNOSIS — F419 Anxiety disorder, unspecified: Secondary | ICD-10-CM | POA: Diagnosis not present

## 2023-11-08 DIAGNOSIS — Z79899 Other long term (current) drug therapy: Secondary | ICD-10-CM | POA: Diagnosis not present

## 2023-11-08 DIAGNOSIS — Z818 Family history of other mental and behavioral disorders: Secondary | ICD-10-CM | POA: Diagnosis not present

## 2023-11-08 DIAGNOSIS — R440 Auditory hallucinations: Secondary | ICD-10-CM | POA: Diagnosis not present

## 2023-11-08 DIAGNOSIS — F259 Schizoaffective disorder, unspecified: Secondary | ICD-10-CM | POA: Diagnosis not present

## 2023-11-08 DIAGNOSIS — Z62810 Personal history of physical and sexual abuse in childhood: Secondary | ICD-10-CM | POA: Diagnosis not present

## 2023-11-08 DIAGNOSIS — Z91128 Patient's intentional underdosing of medication regimen for other reason: Secondary | ICD-10-CM | POA: Insufficient documentation

## 2023-11-08 DIAGNOSIS — F99 Mental disorder, not otherwise specified: Secondary | ICD-10-CM | POA: Insufficient documentation

## 2023-11-08 DIAGNOSIS — K5900 Constipation, unspecified: Secondary | ICD-10-CM | POA: Diagnosis not present

## 2023-11-08 DIAGNOSIS — R296 Repeated falls: Secondary | ICD-10-CM | POA: Diagnosis not present

## 2023-11-08 DIAGNOSIS — F309 Manic episode, unspecified: Secondary | ICD-10-CM | POA: Diagnosis not present

## 2023-11-08 DIAGNOSIS — Z83438 Family history of other disorder of lipoprotein metabolism and other lipidemia: Secondary | ICD-10-CM | POA: Diagnosis not present

## 2023-11-08 HISTORY — DX: Schizophrenia, unspecified: F20.9

## 2023-11-08 LAB — CBC WITH DIFFERENTIAL/PLATELET
Abs Immature Granulocytes: 0.03 10*3/uL (ref 0.00–0.07)
Basophils Absolute: 0.1 10*3/uL (ref 0.0–0.1)
Basophils Relative: 1 %
Eosinophils Absolute: 0.5 10*3/uL (ref 0.0–0.5)
Eosinophils Relative: 6 %
HCT: 42.8 % (ref 39.0–52.0)
Hemoglobin: 13.9 g/dL (ref 13.0–17.0)
Immature Granulocytes: 0 %
Lymphocytes Relative: 22 %
Lymphs Abs: 1.9 10*3/uL (ref 0.7–4.0)
MCH: 29.5 pg (ref 26.0–34.0)
MCHC: 32.5 g/dL (ref 30.0–36.0)
MCV: 90.9 fL (ref 80.0–100.0)
Monocytes Absolute: 0.8 10*3/uL (ref 0.1–1.0)
Monocytes Relative: 9 %
Neutro Abs: 5.3 10*3/uL (ref 1.7–7.7)
Neutrophils Relative %: 62 %
Platelets: 189 10*3/uL (ref 150–400)
RBC: 4.71 MIL/uL (ref 4.22–5.81)
RDW: 13.4 % (ref 11.5–15.5)
WBC: 8.5 10*3/uL (ref 4.0–10.5)
nRBC: 0 % (ref 0.0–0.2)

## 2023-11-08 LAB — COMPREHENSIVE METABOLIC PANEL
ALT: 19 U/L (ref 0–44)
AST: 21 U/L (ref 15–41)
Albumin: 3.8 g/dL (ref 3.5–5.0)
Alkaline Phosphatase: 74 U/L (ref 38–126)
Anion gap: 7 (ref 5–15)
BUN: 15 mg/dL (ref 6–20)
CO2: 27 mmol/L (ref 22–32)
Calcium: 9.4 mg/dL (ref 8.9–10.3)
Chloride: 104 mmol/L (ref 98–111)
Creatinine, Ser: 0.7 mg/dL (ref 0.61–1.24)
GFR, Estimated: >60 mL/min (ref >60)
Glucose, Bld: 71 mg/dL (ref 70–99)
Potassium: 4.3 mmol/L (ref 3.5–5.1)
Sodium: 138 mmol/L (ref 135–145)
Total Bilirubin: 0.5 mg/dL (ref <1.2)
Total Protein: 6.3 g/dL — ABNORMAL LOW (ref 6.5–8.1)

## 2023-11-08 LAB — ETHANOL: Alcohol, Ethyl (B): <10 mg/dL (ref <10)

## 2023-11-08 LAB — POCT URINE DRUG SCREEN - MANUAL ENTRY (I-SCREEN)
POC Amphetamine UR: NOT DETECTED
POC Buprenorphine (BUP): NOT DETECTED
POC Cocaine UR: NOT DETECTED
POC Marijuana UR: NOT DETECTED
POC Methadone UR: NOT DETECTED
POC Methamphetamine UR: NOT DETECTED
POC Morphine: NOT DETECTED
POC Oxazepam (BZO): NOT DETECTED
POC Oxycodone UR: NOT DETECTED
POC Secobarbital (BAR): NOT DETECTED

## 2023-11-08 LAB — VALPROIC ACID LEVEL: Valproic Acid Lvl: <10 ug/mL — ABNORMAL LOW (ref 50.0–100.0)

## 2023-11-08 MED ORDER — HYDROXYZINE HCL 25 MG PO TABS: 25.0000 mg | ORAL_TABLET | Freq: Three times a day (TID) | ORAL | Status: DC | PRN

## 2023-11-08 MED ORDER — DIPHENHYDRAMINE HCL 50 MG/ML IJ SOLN
50.0000 mg | Freq: Once | INTRAMUSCULAR | Status: AC
  Administered 2023-11-08: 50 mg via INTRAMUSCULAR
  Filled 2023-11-08: qty 1

## 2023-11-08 MED ORDER — TRAZODONE HCL 50 MG PO TABS: 50.0000 mg | ORAL_TABLET | Freq: Every evening | ORAL | Status: DC | PRN

## 2023-11-08 MED ORDER — ACETAMINOPHEN 325 MG PO TABS: 650.0000 mg | ORAL_TABLET | Freq: Four times a day (QID) | ORAL | Status: DC | PRN

## 2023-11-08 MED ORDER — ZIPRASIDONE MESYLATE 20 MG IM SOLR
20.0000 mg | Freq: Once | INTRAMUSCULAR | Status: AC
  Administered 2023-11-08: 20 mg via INTRAMUSCULAR
  Filled 2023-11-08: qty 20

## 2023-11-08 MED ORDER — ALUM & MAG HYDROXIDE-SIMETH 200-200-20 MG/5ML PO SUSP: 30.0000 mL | ORAL | Status: DC | PRN

## 2023-11-08 MED ORDER — MAGNESIUM HYDROXIDE 400 MG/5ML PO SUSP: 30.0000 mL | Freq: Every day | ORAL | Status: DC | PRN

## 2023-11-08 NOTE — ED Notes (Addendum)
Patient admitted to adult obs bed 5 with dx. Schizoaffective D/O. Patient A&O x 3 with circumstantial , loose association thought process. Patient is experiencing flight of ideas as well. Patient stated he was fighting his parents today because he wanted to without a cause. Patient also state he would like to harm others to make himself feel good. Patient states that is the way of the world today, you get hurt or hurt others. Patient was able to withdraw from thought r/t religious beliefs. Patient also states he want to end his life by hanging, which he attempted last December with a cord and saxophone strap. Patient states if he was successful with killing himself, he would not hurt inside anymore. Patient is experiencing religiosity as he is constantly speaking of and praying, praising GOD. Patient is yelling, screaming, pacing the milieu. Patient not-easily redirected. No violent behaviors towards others at this time. Provider notified, medications ordered for anxiety, awaiting pharmacy review. Patient able to contract for safety at this time.

## 2023-11-08 NOTE — ED Notes (Signed)
Patient changed into hospital approved scrub attire.

## 2023-11-08 NOTE — Progress Notes (Signed)
   11/08/23 1356  [facility] Triage Screening (Walk-ins at [facility] only)  How Did You Hear About [place]? Legal System  What Is the Reason for Your Visit/Call Today? Pt presents to [facility] via [employer] under IVC orders. Pt states that he doesn't know why he is here. This writer is unable to complete triage at this time because pt wouldn't respond to any questions. Per IVC order, pt is diagnosised with schizoaffective disorder and bipolar. Per IVC order, pt went to the hospital voluntarily 2 weeks ago. Per IVC order, pt is hearing voices and having suicidal ideations. Per IVC order, pt has become violent with his family members. Per IVC order, pt has no motivation to live, feels like others don't care and he doesn't care. Per IVC order, pt drinks everyday.  How Long Has This Been Causing You Problems?  (UTA)  Have You Recently Had Any Thoughts About Hurting Yourself?  (UTA)  Are You Planning to Commit Suicide/Harm Yourself At This time?  (UTA)  Have you Recently Had Thoughts About Hurting Someone Else?  (UTA)  Are You Planning To Harm Someone At This Time?  (UTA)  Physical Abuse  (UTA)  Verbal Abuse  (UTA)  Sexual Abuse  (UTA)  Exploitation of patient/patient's resources  (UTA)  Self-Neglect  (UTA)  Are you currently experiencing any auditory, visual or other hallucinations?  (UTA)  Have You Used Any Alcohol or Drugs in the Past 24 Hours?  (UTA)  Do you have any current medical co-morbidities that require immediate attention?  (UTA)  What Do You Feel Would Help You the Most Today?  (UTA)  If access to [facility]gent [facility] was not available, would you have sought care in the Emergency Department?  (UTA)  Determination of Need  (UTA)  Options For Referral  (UTA)

## 2023-11-08 NOTE — ED Notes (Signed)
Pt under IVC, sleeping at present, no distress noted.  Monitoring for safety. 

## 2023-11-08 NOTE — ED Notes (Signed)
Pt sleeping at present, no distress noted.  Monitoring for safety. 

## 2023-11-08 NOTE — ED Notes (Signed)
Patient was asking for a hug,advised no, then he tried to trap me in the room, standing in the doorway with his arms stretched out.

## 2023-11-08 NOTE — ED Notes (Signed)
Patient voluntarily received Geodon 20 mg into RD and Diphenhydramine 50 mg into LD without problems.
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
## 2023-11-08 NOTE — ED Triage Notes (Signed)
Pt came via POV, unable to tell me why he is here. Pt wandering the halls, standing on top of stretcher, jumping on stretcher, eating dirt off the floor. Pt states "I just received a shot of Haldol" pt unable to elaborate. Pt denies SI.  States he is hearing voices. Pt encouraged to stay in his room but pt states "I will do what I want".

## 2023-11-08 NOTE — ED Notes (Signed)
Patient walked out of the room into the lobby and was redirected into room by sort tech.

## 2023-11-08 NOTE — ED Notes (Addendum)
Patient ambulated out of the room and laid on the floor, then begun spinning on the floor while laying on his stomach. Patient continued to roll around floor spinning to clean the floor since he is "bored and hungry." Patient redirected to room at this time.

## 2023-11-08 NOTE — ED Provider Notes (Signed)
[place] EMERGENCY DEPARTMENT AT [facility] Provider Note   CSN: [id number] Arrival date & time: 11/08/23  5284     History  Chief Complaint  Patient presents with   Psychiatric Evaluation    Mario Thomas is a 22 y.o. male.  He was brought to the ED for unclear reasons.  He said he is not right in his head.  He denies being suicidal.  He said he just was discharged from a psychiatric facility and had a shot of Haldol.  He is supposed to be taking other medications but he is not taking them.  He denies any medical complaints, just that he is hungry and he wants some food.  It looks like he has been seen by behavioral health urgent care a few times recently.  He states he wants to see a psychiatrist but not here today.  Denies alcohol or drug use.  The history is provided by the patient.  Mental Health Problem Presenting symptoms: bizarre behavior   Onset quality:  Unable to specify Chronicity:  Chronic Context: noncompliance   Relieved by:  Nothing Associated symptoms: no abdominal pain, no chest pain and no headaches   Risk factors: hx of mental illness        Home Medications Prior to Admission medications   Medication Sig Start Date End Date Taking? Authorizing Provider  benztropine (COGENTIN) 2 MG tablet Take 1 tablet (2 mg total) by mouth daily. 10/24/23   Bing Neighbors, NP  divalproex (DEPAKOTE) 250 MG DR tablet Take 1 tablet (250 mg total) by mouth every 12 (twelve) hours. 10/24/23   Bing Neighbors, NP  haloperidol (HALDOL) 2 MG tablet Take 1 tablet (2 mg total) by mouth 2 (two) times daily for 15 days. 10/24/23 11/08/23  Bing Neighbors, NP  traZODone (DESYREL) 50 MG tablet Take 1 tablet (50 mg total) by mouth at bedtime. 10/24/23   Bing Neighbors, NP      Allergies    Patient has no known allergies.    Review of Systems   Review of Systems  Constitutional:  Negative for fever.  Respiratory:  Negative for shortness of breath.    Cardiovascular:  Negative for chest pain.  Gastrointestinal:  Negative for abdominal pain.  Neurological:  Negative for headaches.    Physical Exam Updated Vital Signs BP (!) 154/81   Pulse 67   Temp 98.4 F (36.9 C) (Oral)   Resp 14   SpO2 100%  Physical Exam Vitals and nursing note reviewed.  Constitutional:      General: He is not in acute distress.    Appearance: Normal appearance. He is well-developed.  HENT:     Head: Normocephalic and atraumatic.  Eyes:     Conjunctiva/sclera: Conjunctivae normal.  Cardiovascular:     Rate and Rhythm: Normal rate and regular rhythm.     Heart sounds: No murmur heard. Pulmonary:     Effort: Pulmonary effort is normal. No respiratory distress.     Breath sounds: Normal breath sounds.  Abdominal:     Palpations: Abdomen is soft.     Tenderness: There is no abdominal tenderness.  Musculoskeletal:        General: No deformity.     Cervical back: Neck supple.  Skin:    General: Skin is warm and dry.     Capillary Refill: Capillary refill takes less than 2 seconds.  Neurological:     General: No focal deficit present.     Mental  Status: He is alert and oriented to person, place, and time.     Sensory: No sensory deficit.     Motor: No weakness.     ED Results / Procedures / Treatments   Labs (all labs ordered are listed, but only abnormal results are displayed) Labs Reviewed - No data to display  EKG None  Radiology No results found.  Procedures Procedures    Medications Ordered in ED Medications - No data to display  ED Course/ Medical Decision Making/ A&P Clinical Course as of 11/08/23 1820  Fri Nov 08, 2023  0920 I reviewed case with nurse practitioner behavioral health.  They said he can have a TTS evaluation her he can follow-up with [facility] as he was informed yesterday.  I reviewed this with the patient and gave him the options.  He said he would rather be discharged and follow-up with  behavioral health as an outpatient.  He is awake and appropriate at this time.  He denies any suicidal or homicidal ideations.   [MB]    Clinical Course User Index [MB] Terrilee Files, MD                                 Medical Decision Making  This patient complains of bizarre behavior; this involves an extensive number of treatment Options and is a complaint that carries with it a high risk of complications and morbidity. The differential includes schizophrenia, psychosis, mental health crisis, intoxication Previous records obtained and reviewed in epic, patient recently seen by behavioral health and discharged I reached out to behavioral health and they said if we wanted their input we would have to get a TTS consult Social determinants considered, no significant barriers Critical Interventions: None  After the interventions stated above, I reevaluated the patient and found patient to be cooperative and redirectable Admission and further testing considered, he was offered to stay in the emergency department for psychiatric evaluation versus discharge and he can follow-up with them as outpatient.  He is electing to be discharged.  He understands to return if any worsening of his condition.         Final Clinical Impression(s) / ED Diagnoses Final diagnoses:  Schizoaffective disorder, unspecified type [facility])    Rx / DC Orders ED Discharge Orders     None         Terrilee Files, MD 11/08/23 1824

## 2023-11-08 NOTE — ED Notes (Signed)
Pt provided with food pack from nutrition room by [facility] officer.
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
## 2023-11-08 NOTE — ED Notes (Addendum)
Patient ambulated to desk with no difficulties, appears restless.

## 2023-11-08 NOTE — ED Notes (Signed)
Pt to RN station multiple times. Pt took 3 masks and reached for a 4th. Attempted to get pt to give this RN the masks and pt wouldn't. Pt then sat in wheelchair in hallway and this RN took pt back to his room. Security called to bedside. Pt not following commands/instructions of staff. Pt disorganized, flight of ideas, hyperactive behavior, labile. Notified EDP of pt behavior.

## 2023-11-08 NOTE — ED Notes (Signed)
Patient back in the hallway, refusing to  go in his room, took about 10 face masks advised him to stop, patient escorted back into his room by staff, security present, Charm Barges, MD at bedside.

## 2023-11-09 ENCOUNTER — Inpatient Hospital Stay (HOSPITAL_COMMUNITY)
Admit: 2023-11-09 | Discharge: 2023-11-21 | DRG: 885 | Disposition: A | Discharge status: Home / Self Care | Payer: BC Managed Care – PPO | Source: Intra-hospital | Attending: Psychiatry | Admitting: Psychiatry

## 2023-11-09 DIAGNOSIS — K5900 Constipation, unspecified: Secondary | ICD-10-CM | POA: Diagnosis present

## 2023-11-09 DIAGNOSIS — Z83438 Family history of other disorder of lipoprotein metabolism and other lipidemia: Secondary | ICD-10-CM | POA: Diagnosis not present

## 2023-11-09 DIAGNOSIS — F259 Schizoaffective disorder, unspecified: Secondary | ICD-10-CM | POA: Diagnosis not present

## 2023-11-09 DIAGNOSIS — Z91199 Patient's noncompliance with other medical treatment and regimen due to unspecified reason: Secondary | ICD-10-CM

## 2023-11-09 DIAGNOSIS — F250 Schizoaffective disorder, bipolar type: Secondary | ICD-10-CM | POA: Diagnosis not present

## 2023-11-09 DIAGNOSIS — Z79899 Other long term (current) drug therapy: Secondary | ICD-10-CM

## 2023-11-09 DIAGNOSIS — Z818 Family history of other mental and behavioral disorders: Secondary | ICD-10-CM | POA: Diagnosis not present

## 2023-11-09 DIAGNOSIS — Z9151 Personal history of suicidal behavior: Secondary | ICD-10-CM | POA: Diagnosis not present

## 2023-11-09 DIAGNOSIS — Z62810 Personal history of physical and sexual abuse in childhood: Secondary | ICD-10-CM

## 2023-11-09 DIAGNOSIS — R011 Cardiac murmur, unspecified: Secondary | ICD-10-CM | POA: Diagnosis present

## 2023-11-09 DIAGNOSIS — Z91128 Patient's intentional underdosing of medication regimen for other reason: Secondary | ICD-10-CM

## 2023-11-09 DIAGNOSIS — R45851 Suicidal ideations: Secondary | ICD-10-CM | POA: Diagnosis present

## 2023-11-09 DIAGNOSIS — R296 Repeated falls: Secondary | ICD-10-CM | POA: Diagnosis present

## 2023-11-09 DIAGNOSIS — F309 Manic episode, unspecified: Secondary | ICD-10-CM | POA: Diagnosis present

## 2023-11-09 DIAGNOSIS — F419 Anxiety disorder, unspecified: Secondary | ICD-10-CM | POA: Diagnosis present

## 2023-11-09 MED ORDER — LORAZEPAM 2 MG/ML IJ SOLN
2.0000 mg | Freq: Once | INTRAMUSCULAR | Status: AC | PRN
  Administered 2023-11-09: 2 mg via INTRAMUSCULAR
  Filled 2023-11-09: qty 1

## 2023-11-09 MED ORDER — LORAZEPAM 2 MG/ML IJ SOLN
INTRAMUSCULAR | Status: AC
  Administered 2023-11-09: 2 mg
  Filled 2023-11-09: qty 1

## 2023-11-09 MED ORDER — HALOPERIDOL LACTATE 5 MG/ML IJ SOLN
INTRAMUSCULAR | Status: AC
  Administered 2023-11-09: 10 mg
  Filled 2023-11-09: qty 2

## 2023-11-09 MED ORDER — LORAZEPAM 2 MG/ML IJ SOLN: 2.0000 mg | Freq: Once | INTRAMUSCULAR | Status: AC

## 2023-11-09 MED ORDER — ZIPRASIDONE MESYLATE 20 MG IM SOLR
20.0000 mg | Freq: Once | INTRAMUSCULAR | Status: AC | PRN
  Administered 2023-11-09: 20 mg via INTRAMUSCULAR
  Filled 2023-11-09: qty 20

## 2023-11-09 MED ORDER — HALOPERIDOL LACTATE 5 MG/ML IJ SOLN: 10.0000 mg | Freq: Once | INTRAMUSCULAR | Status: AC

## 2023-11-09 MED ORDER — DIVALPROEX SODIUM 250 MG PO DR TAB: 250.0000 mg | DELAYED_RELEASE_TABLET | Freq: Two times a day (BID) | ORAL | Status: DC

## 2023-11-09 MED ORDER — LORAZEPAM 1 MG PO TABS: 2.0000 mg | ORAL_TABLET | Freq: Once | ORAL | Status: AC | PRN

## 2023-11-09 MED ORDER — DIVALPROEX SODIUM 500 MG PO DR TAB
500.0000 mg | DELAYED_RELEASE_TABLET | Freq: Two times a day (BID) | ORAL | Status: DC
Start: 2023-11-09 — End: 2023-11-09
  Administered 2023-11-09: 500 mg via ORAL
  Filled 2023-11-09 (×2): qty 1

## 2023-11-09 NOTE — ED Notes (Signed)
Patient resting quietly in bed with eyes closed. Respirations equal and unlabored, skin warm and dry, NAD. Routine safety checks conducted according to facility protocol. Will continue to monitor for safety.  

## 2023-11-09 NOTE — Progress Notes (Signed)
[facility] LCSW Progress Note   11/09/2023    10:44 AM  Mario Thomas   [id number]   Type of Contact and Topic:  Psychiatric Bed Placement   Pt accepted to [facility] Unit    Patient meets inpatient criteria per [name], NP  The attending provider will be Dr. Josem Kaufmann  Call report to [phone]    Kristine Royal, RN @ [facility] notified.     Pt scheduled  to arrive at [facility] TODAY.    Damita Dunnings, MSW, LCSW-A  10:45 AM 11/09/2023

## 2023-11-09 NOTE — ED Notes (Signed)
Patient was shown aggression aeb staff observation. Patient now resting safe on unit with continued monitoring aeb staff report.

## 2023-11-09 NOTE — ED Notes (Signed)
Pt cursing, pacing stating he cannot sleep.  Pt offered oral meds for sleep, he adamantly refused and continued to curse loudly and pace.  Disturbing the milieu, un redirectable. NP Ene Ajibola notified.  Pending injections.

## 2023-11-09 NOTE — ED Provider Notes (Signed)
Behavioral Health Progress Note  Date and Time: 11/09/2023 8:03 AM Name: Mario Thomas MRN:  [id number]  Subjective:  Mario Thomas is a 22 year old male patient with a past psychiatric history significant for schizoaffective disorder, bipolar type who presented to the [facility] on 11/08/23 under involuntary commitment, petitioned by his sister Mario Thomas [phone].   Per IVC, -Schizoaffective disorder, bipolar -Meds: Valfproate, trazadone, and Haladol, No taking meds -Went to the hospital vountary 2weeks ago -Suicidal ideations -Hearing voices -Becoming violent with family members -No motivation to live,feels like others dont care and so he doesn't care -Drinks every day   On evaluation today, patient is observed standing at the nurses station. He was redirected to a seated area to complete the assessment. Patient states that he was unable to sleep last night and states that he started singing. Per nursing note, patient was observed up around 0100 AM pacing, and cursing. He reports racing thoughts at night. He appears to be restless on exam. He denies active SI today and states that he last felt suicidal yesterday. He continues to endorse HI with no plan or intent toward others. He contracts for safety to not harm himself or others on the unit. He reports a normal appetite. He states that he does not know if he is experiencing hallucinations. There is no objective evidence that the patient is responding to internal or external stimuli. He denies physical complaints.  Later this morning, patient became agitated, yelling, verbally threatening, pacing, posturing, and cursing. He was difficult to redirect. Per Dr. Cyndia Diver administer Haldo 10 mg IM and Ativan 2 mg IM for agitation.   Diagnosis:  Final diagnoses:  Schizoaffective disorder, unspecified type (HCC)    Total Time spent with patient: 30 minutes  Past Psychiatric History: history of  schizoaffective, bipolar type.   Past Medical History: no history reported   Family History: no history reported   Family Psychiatric  History: no history reported   Social History: Patient resides with his parents. He denies drinking alcohol or using illicit drugs. He is unemployed.  Additional Social History:    Pain Medications: See MAR Prescriptions: See MAR Over the Counter: See MAR History of alcohol / drug use?: No history of alcohol / drug abuse (Pt denies) Longest period of sobriety (when/how long): NA Negative Consequences of Use:  (NA) Withdrawal Symptoms:  (NA)      Current Medications:  Current Facility-Administered Medications  Medication Dose Route Frequency Provider Last Rate Last Admin   acetaminophen (TYLENOL) tablet 650 mg  650 mg Oral Q6H PRN Jaria [name] L, NP       alum & mag hydroxide-simeth (MAALOX/MYLANTA) 200-200-20 MG/5ML suspension 30 mL  30 mL Oral Q4H PRN Glora Hulgan L, NP       divalproex (DEPAKOTE) DR tablet 250 mg  250 mg Oral Q12H Kanya Potteiger L, NP       hydrOXYzine (ATARAX) tablet 25 mg  25 mg Oral TID PRN Analissa Bayless L, NP       magnesium hydroxide (MILK OF MAGNESIA) suspension 30 mL  30 mL Oral Daily PRN Aarik Blank L, NP       traZODone (DESYREL) tablet 50 mg  50 mg Oral QHS PRN Hareem Surowiec L, NP       Current Outpatient Medications  Medication Sig Dispense Refill   benztropine (COGENTIN) 2 MG tablet Take 1 tablet (2 mg total) by mouth daily. 30 tablet 0   divalproex (DEPAKOTE) 250 MG  DR tablet Take 1 tablet (250 mg total) by mouth every 12 (twelve) hours. 30 tablet 0   haloperidol (HALDOL) 2 MG tablet Take 1 tablet (2 mg total) by mouth 2 (two) times daily for 15 days. 30 tablet 0   traZODone (DESYREL) 50 MG tablet Take 1 tablet (50 mg total) by mouth at bedtime. 30 tablet 0    Labs  Lab Results:  Admission on 11/08/2023  Component Date Value Ref Range Status   WBC 11/08/2023 8.5  4.0 - 10.5 K/uL Final   RBC  11/08/2023 4.71  4.22 - 5.81 MIL/uL Final   Hemoglobin 11/08/2023 13.9  13.0 - 17.0 g/dL Final   HCT 40/98/1191 42.8  39.0 - 52.0 % Final   MCV 11/08/2023 90.9  80.0 - 100.0 fL Final   MCH 11/08/2023 29.5  26.0 - 34.0 pg Final   MCHC 11/08/2023 32.5  30.0 - 36.0 g/dL Final   RDW 47/82/9562 13.4  11.5 - 15.5 % Final   Platelets 11/08/2023 189  150 - 400 K/uL Final   nRBC 11/08/2023 0.0  0.0 - 0.2 % Final   Neutrophils Relative % 11/08/2023 62  % Final   Neutro Abs 11/08/2023 5.3  1.7 - 7.7 K/uL Final   Lymphocytes Relative 11/08/2023 22  % Final   Lymphs Abs 11/08/2023 1.9  0.7 - 4.0 K/uL Final   Monocytes Relative 11/08/2023 9  % Final   Monocytes Absolute 11/08/2023 0.8  0.1 - 1.0 K/uL Final   Eosinophils Relative 11/08/2023 6  % Final   Eosinophils Absolute 11/08/2023 0.5  0.0 - 0.5 K/uL Final   Basophils Relative 11/08/2023 1  % Final   Basophils Absolute 11/08/2023 0.1  0.0 - 0.1 K/uL Final   Immature Granulocytes 11/08/2023 0  % Final   Abs Immature Granulocytes 11/08/2023 0.03  0.00 - 0.07 K/uL Final   Performed at [facility] Lab, [address], [place], [place] [address]   Sodium 11/08/2023 138  135 - 145 mmol/L Final   Potassium 11/08/2023 4.3  3.5 - 5.1 mmol/L Final   Chloride 11/08/2023 104  98 - 111 mmol/L Final   CO2 11/08/2023 27  22 - 32 mmol/L Final   Glucose, Bld 11/08/2023 71  70 - 99 mg/dL Final   Glucose reference range applies only to samples taken after fasting for at least 8 hours.   BUN 11/08/2023 15  6 - 20 mg/dL Final   Creatinine, Ser 11/08/2023 0.70  0.61 - 1.24 mg/dL Final   Calcium 57/84/6962 9.4  8.9 - 10.3 mg/dL Final   Total Protein 95/28/4132 6.3 (L)  6.5 - 8.1 g/dL Final   Albumin 44/12/270 3.8  3.5 - 5.0 g/dL Final   AST 53/66/4403 21  15 - 41 U/L Final   ALT 11/08/2023 19  0 - 44 U/L Final   Alkaline Phosphatase 11/08/2023 74  38 - 126 U/L Final   Total Bilirubin 11/08/2023 0.5  <1.2 mg/dL Final   GFR, Estimated 11/08/2023 >60  >60 mL/min  Final   Comment: (NOTE) Calculated using the CKD-EPI Creatinine Equation (2021)    Anion gap 11/08/2023 7  5 - 15 Final   Performed at [facility] Lab, [address], [place], [place] [address]   Alcohol, Ethyl (B) 11/08/2023 <10  <10 mg/dL Final   Comment: (NOTE) Lowest detectable limit for serum alcohol is 10 mg/dL.  For medical purposes only. Performed at [facility] Lab, [address], [place], [place] [address]  POC Amphetamine UR 11/08/2023 None Detected  NONE DETECTED (Cut Off Level 1000 ng/mL) Preliminary   POC Secobarbital (BAR) 11/08/2023 None Detected  NONE DETECTED (Cut Off Level 300 ng/mL) Preliminary   POC Buprenorphine (BUP) 11/08/2023 None Detected  NONE DETECTED (Cut Off Level 10 ng/mL) Preliminary   POC Oxazepam (BZO) 11/08/2023 None Detected  NONE DETECTED (Cut Off Level 300 ng/mL) Preliminary   POC Cocaine UR 11/08/2023 None Detected  NONE DETECTED (Cut Off Level 300 ng/mL) Preliminary   POC Methamphetamine UR 11/08/2023 None Detected  NONE DETECTED (Cut Off Level 1000 ng/mL) Preliminary   POC Morphine 11/08/2023 None Detected  NONE DETECTED (Cut Off Level 300 ng/mL) Preliminary   POC Methadone UR 11/08/2023 None Detected  NONE DETECTED (Cut Off Level 300 ng/mL) Preliminary   POC Oxycodone UR 11/08/2023 None Detected  NONE DETECTED (Cut Off Level 100 ng/mL) Preliminary   POC Marijuana UR 11/08/2023 None Detected  NONE DETECTED (Cut Off Level 50 ng/mL) Preliminary   Valproic Acid Lvl 11/08/2023 <10 (L)  50.0 - 100.0 ug/mL Final   Comment: RESULTS CONFIRMED BY MANUAL DILUTION Performed at [facility], [address] [place], [place] [address]   Admission on 10/24/2023, Discharged on 10/25/2023  Component Date Value Ref Range Status   POC Amphetamine UR 10/24/2023 None Detected  NONE DETECTED (Cut Off Level 1000 ng/mL) Final   POC Secobarbital (BAR) 10/24/2023 None Detected  NONE DETECTED (Cut Off Level 300 ng/mL) Final   POC Buprenorphine (BUP)  10/24/2023 None Detected  NONE DETECTED (Cut Off Level 10 ng/mL) Final   POC Oxazepam (BZO) 10/24/2023 None Detected  NONE DETECTED (Cut Off Level 300 ng/mL) Final   POC Cocaine UR 10/24/2023 None Detected  NONE DETECTED (Cut Off Level 300 ng/mL) Final   POC Methamphetamine UR 10/24/2023 None Detected  NONE DETECTED (Cut Off Level 1000 ng/mL) Final   POC Morphine 10/24/2023 None Detected  NONE DETECTED (Cut Off Level 300 ng/mL) Final   POC Methadone UR 10/24/2023 None Detected  NONE DETECTED (Cut Off Level 300 ng/mL) Final   POC Oxycodone UR 10/24/2023 None Detected  NONE DETECTED (Cut Off Level 100 ng/mL) Final   POC Marijuana UR 10/24/2023 None Detected  NONE DETECTED (Cut Off Level 50 ng/mL) Final  Admission on 10/15/2023, Discharged on 10/15/2023  Component Date Value Ref Range Status   WBC 10/15/2023 7.0  4.0 - 10.5 K/uL Final   RBC 10/15/2023 5.29  4.22 - 5.81 MIL/uL Final   Hemoglobin 10/15/2023 15.6  13.0 - 17.0 g/dL Final   HCT 56/21/3086 47.5  39.0 - 52.0 % Final   MCV 10/15/2023 89.8  80.0 - 100.0 fL Final   MCH 10/15/2023 29.5  26.0 - 34.0 pg Final   MCHC 10/15/2023 32.8  30.0 - 36.0 g/dL Final   RDW 57/84/6962 13.8  11.5 - 15.5 % Final   Platelets 10/15/2023 211  150 - 400 K/uL Final   nRBC 10/15/2023 0.0  0.0 - 0.2 % Final   Neutrophils Relative % 10/15/2023 58  % Final   Neutro Abs 10/15/2023 4.1  1.7 - 7.7 K/uL Final   Lymphocytes Relative 10/15/2023 30  % Final   Lymphs Abs 10/15/2023 2.1  0.7 - 4.0 K/uL Final   Monocytes Relative 10/15/2023 8  % Final   Monocytes Absolute 10/15/2023 0.6  0.1 - 1.0 K/uL Final   Eosinophils Relative 10/15/2023 3  % Final   Eosinophils Absolute 10/15/2023 0.2  0.0 - 0.5 K/uL Final   Basophils Relative  10/15/2023 1  % Final   Basophils Absolute 10/15/2023 0.0  0.0 - 0.1 K/uL Final   Immature Granulocytes 10/15/2023 0  % Final   Abs Immature Granulocytes 10/15/2023 0.02  0.00 - 0.07 K/uL Final   Performed at [facility] Lab, [address], [place], [place] [address]   Sodium 10/15/2023 139  135 - 145 mmol/L Final   Potassium 10/15/2023 4.1  3.5 - 5.1 mmol/L Final   Chloride 10/15/2023 101  98 - 111 mmol/L Final   CO2 10/15/2023 27  22 - 32 mmol/L Final   Glucose, Bld 10/15/2023 79  70 - 99 mg/dL Final   Glucose reference range applies only to samples taken after fasting for at least 8 hours.   BUN 10/15/2023 14  6 - 20 mg/dL Final   Creatinine, Ser 10/15/2023 0.63  0.61 - 1.24 mg/dL Final   Calcium 09/81/1914 9.7  8.9 - 10.3 mg/dL Final   Total Protein 78/29/5621 6.9  6.5 - 8.1 g/dL Final   Albumin 30/86/5784 4.3  3.5 - 5.0 g/dL Final   AST 69/62/9528 18  15 - 41 U/L Final   ALT 10/15/2023 16  0 - 44 U/L Final   Alkaline Phosphatase 10/15/2023 90  38 - 126 U/L Final   Total Bilirubin 10/15/2023 0.6  <1.2 mg/dL Final   GFR, Estimated 10/15/2023 >60  >60 mL/min Final   Comment: (NOTE) Calculated using the CKD-EPI Creatinine Equation (2021)    Anion gap 10/15/2023 11  5 - 15 Final   Performed at [facility] Lab, [address] [place], [place] [address]   Hgb A1c MFr Bld 10/15/2023 4.8  4.8 - 5.6 % Final   Comment: (NOTE) Pre diabetes:          5.7%-6.4%  Diabetes:              >6.4%  Glycemic control for   <7.0% adults with diabetes    Mean Plasma Glucose 10/15/2023 91.06  mg/dL Final   Performed at [facility] Lab, [address] [place], [place] [address]   Cholesterol 10/15/2023 125  0 - 200 mg/dL Final   Triglycerides 72/53/6644 56  <150 mg/dL Final   HDL 03/47/4259 47  >40 mg/dL Final   Total CHOL/HDL Ratio 10/15/2023 2.7  RATIO Final   VLDL 10/15/2023 11  0 - 40 mg/dL Final   LDL Cholesterol 10/15/2023 67  0 - 99 mg/dL Final   Comment:        Total Cholesterol/HDL:CHD Risk Coronary Heart Disease Risk Table                     Men   Women  1/2 Average Risk   3.4   3.3  Average Risk       5.0   4.4  2 X Average Risk   9.6   7.1  3 X Average Risk  23.4   11.0        Use the calculated  Patient Ratio above and the CHD Risk Table to determine the patient's CHD Risk.        ATP III CLASSIFICATION (LDL):  <100     mg/dL   Optimal  563-875  mg/dL   Near or Above                    Optimal  130-159  mg/dL   Borderline  643-329  mg/dL   High  >518  mg/dL   Very High Performed at [facility] Lab, [address] [place], [place] [address]    TSH 10/15/2023 4.190  0.350 - 4.500 uIU/mL Final   Comment: Performed by a 3rd Generation assay with a functional sensitivity of <=0.01 uIU/mL. Performed at [facility] Lab, [address], [place], [place] [address]    Alcohol, Ethyl (B) 10/15/2023 <10  <10 mg/dL Final   Comment: (NOTE) Lowest detectable limit for serum alcohol is 10 mg/dL.  For medical purposes only. Performed at [facility] Lab, [address] [place], [place] [address]    POC Amphetamine UR 10/15/2023 None Detected  NONE DETECTED (Cut Off Level 1000 ng/mL) Final   POC Secobarbital (BAR) 10/15/2023 None Detected  NONE DETECTED (Cut Off Level 300 ng/mL) Final   POC Buprenorphine (BUP) 10/15/2023 None Detected  NONE DETECTED (Cut Off Level 10 ng/mL) Final   POC Oxazepam (BZO) 10/15/2023 None Detected  NONE DETECTED (Cut Off Level 300 ng/mL) Final   POC Cocaine UR 10/15/2023 None Detected  NONE DETECTED (Cut Off Level 300 ng/mL) Final   POC Methamphetamine UR 10/15/2023 None Detected  NONE DETECTED (Cut Off Level 1000 ng/mL) Final   POC Morphine 10/15/2023 None Detected  NONE DETECTED (Cut Off Level 300 ng/mL) Final   POC Methadone UR 10/15/2023 None Detected  NONE DETECTED (Cut Off Level 300 ng/mL) Final   POC Oxycodone UR 10/15/2023 None Detected  NONE DETECTED (Cut Off Level 100 ng/mL) Final   POC Marijuana UR 10/15/2023 None Detected  NONE DETECTED (Cut Off Level 50 ng/mL) Final  Admission on 05/17/2023, Discharged on 05/18/2023  Component Date Value Ref Range Status   Sodium 05/17/2023 139  135 - 145 mmol/L Final   Potassium 05/17/2023 4.3  3.5 -  5.1 mmol/L Final   Chloride 05/17/2023 103  98 - 111 mmol/L Final   CO2 05/17/2023 26  22 - 32 mmol/L Final   Glucose, Bld 05/17/2023 98  70 - 99 mg/dL Final   Glucose reference range applies only to samples taken after fasting for at least 8 hours.   BUN 05/17/2023 12  6 - 20 mg/dL Final   Creatinine, Ser 05/17/2023 0.89  0.61 - 1.24 mg/dL Final   Calcium 91/47/8295 9.7  8.9 - 10.3 mg/dL Final   GFR, Estimated 05/17/2023 >60  >60 mL/min Final   Comment: (NOTE) Calculated using the CKD-EPI Creatinine Equation (2021)    Anion gap 05/17/2023 10  5 - 15 Final   Performed at [facility] Lab, [address] [place], [place] [address]   WBC 05/17/2023 7.4  4.0 - 10.5 K/uL Final   RBC 05/17/2023 5.16  4.22 - 5.81 MIL/uL Final   Hemoglobin 05/17/2023 15.4  13.0 - 17.0 g/dL Final   HCT 86/57/8469 46.6  39.0 - 52.0 % Final   MCV 05/17/2023 90.3  80.0 - 100.0 fL Final   MCH 05/17/2023 29.8  26.0 - 34.0 pg Final   MCHC 05/17/2023 33.0  30.0 - 36.0 g/dL Final   RDW 62/95/2841 12.4  11.5 - 15.5 % Final   Platelets 05/17/2023 193  150 - 400 K/uL Final   nRBC 05/17/2023 0.0  0.0 - 0.2 % Final   Performed at [facility] Lab, [address] [place], [place] [address]   Troponin I (High Sensitivity) 05/17/2023 <2  <18 ng/L Final   Comment: (NOTE) Elevated high sensitivity troponin I (hsTnI) values and significant  changes across serial measurements may suggest ACS  but many other  chronic and acute conditions are known to elevate hsTnI results.  Refer to the "Links" section for chest pain algorithms and additional  guidance. Performed at [facility] Lab, [address], [place], [place] [address]    Troponin I (High Sensitivity) 05/17/2023 3  <18 ng/L Final   Comment: (NOTE) Elevated high sensitivity troponin I (hsTnI) values and significant  changes across serial measurements may suggest ACS but many other  chronic and acute conditions are known to elevate hsTnI results.  Refer to the  "Links" section for chest pain algorithms and additional  guidance. Performed at [facility] Lab, [address] [place], [place] [address]    D-Dimer, Quant 05/18/2023 <0.27  0.00 - 0.50 ug/mL-FEU Final   Comment: (NOTE) At the manufacturer cut-off value of 0.5 g/mL FEU, this assay has a negative predictive value of 95-100%.This assay is intended for use in conjunction with a clinical pretest probability (PTP) assessment model to exclude pulmonary embolism (PE) and deep venous thrombosis (DVT) in outpatients suspected of PE or DVT. Results should be correlated with clinical presentation. Performed at [facility], [address], [place], [place] [address]     Blood Alcohol level:  Lab Results  Component Value Date   [facility] <10 11/08/2023   ETH <10 10/15/2023    Metabolic Disorder Labs: Lab Results  Component Value Date   HGBA1C 4.8 10/15/2023   MPG 91.06 10/15/2023   MPG 91.06 09/25/2022   Lab Results  Component Value Date   PROLACTIN 10.0 08/24/2018   Lab Results  Component Value Date   CHOL 125 10/15/2023   TRIG 56 10/15/2023   HDL 47 10/15/2023   CHOLHDL 2.7 10/15/2023   VLDL 11 10/15/2023   LDLCALC 67 10/15/2023   LDLCALC 75 09/25/2022    Therapeutic Lab Levels: No results found for: "LITHIUM" Lab Results  Component Value Date   VALPROATE <10 (L) 11/08/2023   VALPROATE 94 11/22/2022   No results found for: "CBMZ"  Physical Findings   AIMS    Flowsheet Row [facility]) [facility] Ad[facility] (Discharged) from OP Visit [facility] Admission (Discharged) [facility] Admission (Discharged) from [facility] Admission (Discharged) [facility]  AIMS Total Score 0 0 0 0 0      AUDIT     Flowsheet Row Ad[facility]) [facility] Admission (Discharged) from [facility] Admission (Discharged) from [facility]  Alcohol Use Disorder Identification Test Final Score (AUDIT) 0 2 0      PHQ2-9    Flowsheet Row Office Visit from 11/27/2019 in [facility] [place] Office Visit from 08/04/2018 in [facility] [place]  PHQ-2 Total Score 0 0      Flowsheet Row ED from 11/08/2023 in [facility] Most recent reading at 11/08/2023  5:41 PM ED from 11/08/2023 in [facility] Most recent reading at 11/08/2023  9:00 AM ED from 10/24/2023 in [facility] Most recent reading at 10/24/2023  5:00 PM  C-SSRS RISK CATEGORY High Risk No Risk High Risk  Musculoskeletal  Strength & Muscle Tone: within normal limits Gait & Station: normal Patient leans: N/A  Psychiatric Specialty Exam  Presentation  General Appearance:  Disheveled  Eye Contact: Fair  Speech: Clear and Coherent  Speech Volume: Decreased  Handedness: Left   Mood and Affect  Mood: Depressed  Affect: Congruent   Thought Process  Thought Processes: Coherent  Descriptions of Associations:Circumstantial  Orientation:Full (Time, Place and Person)  Thought Content:Logical  Diagnosis of Schizophrenia or Schizoaffective disorder in past: Yes  Duration of Psychotic Symptoms: Greater than six months   Hallucinations:Hallucinations: Other (comment) (UTA)  Ideas of Reference:None  Suicidal Thoughts:Suicidal Thoughts: No  Homicidal Thoughts:Homicidal Thoughts: No   Sensorium  Memory: Immediate Fair  Judgment: Impaired  Insight: Lacking   Executive Functions   Concentration: Poor  Attention Span: Poor  Recall: [employer] of Knowledge: Fair  Language: Fair   Psychomotor Activity  Psychomotor Activity: Psychomotor Activity: Restlessness   Assets  Assets: Communication Skills; Desire for Improvement; Financial Resources/Insurance; Housing; Leisure Time; Physical Health; Social Support; Transportation   Sleep  Sleep: Poor    Physical Exam  Physical Exam HENT:     Nose: Nose normal.  Eyes:     Conjunctiva/sclera: Conjunctivae normal.  Neck:     Comments: Abrasion to posterior neck.  Cardiovascular:     Rate and Rhythm: Normal rate.  Pulmonary:     Effort: Pulmonary effort is normal.  Musculoskeletal:        General: Normal range of motion.  Neurological:     Mental Status: He is alert and oriented to person, place, and time.   Review of Systems  Constitutional: Negative.   HENT: Negative.    Eyes: Negative.   Respiratory: Negative.    Cardiovascular: Negative.   Gastrointestinal: Negative.   Genitourinary: Negative.   Musculoskeletal: Negative.   Neurological: Negative.   Endo/Heme/Allergies: Negative.    Blood pressure 118/71, pulse 69, temperature 98.3 F (36.8 C), resp. rate 18, SpO2 100%. There is no height or weight on file to calculate BMI.  Treatment Plan Summary: Patient is recommended for inpatient psychiatric treatment for mood stabilization and medication management due to IVC petitioned, reported depression, active suicidal ideations, homicidal ideations, auditory hallucinations and medication noncompliance on initial exam. Patient continues to be under involuntary commitment.    Medication regimen  Haldol decanoate 100 mg IM given on 10/24/23 and next injection is due on 11/19/23. Depakote 250 mg p.o. every 12 hours. Per Dr. Cyndia Diver, increase Depakote to 500 mg po BID Continue trazodone 50  mg po at bedtime prn for sleep  Valproic level <10  Layla Barter, NP 11/09/2023 8:03  AM

## 2023-11-09 NOTE — Progress Notes (Signed)
Patient has been denied by [facility] due to no appropriate beds available. Patient meets BH inpatient criteria per [name], NP. Patient has been faxed out to the following facilities:   [facility]  [address] [place] [address] [phone]  [facility]  [address] [place] [address] [phone]  [facility] [address]  [address] [place] [address] [phone]  [facility] Patient Placement  [facility], [place] [phone]  [facility]  [address], [place] [address] [phone]  [facility]  [address] [place] [address] [phone]  [facility]  [address] [place] [address] [phone]  [facility]  [address] [place] [address] [phone]  [facility] EFAX  [address], [place] [phone]  [facility]  [address], [place] [address] [phone]  [facility]  [address] [place] [address] [phone]  [facility] [address]  [place] [address] [phone]  [facility]  [address] [facility] [place] [place] [address] [phone]  [facility]  [address] [place], [place] [address] [phone]  [facility]  [address] [place] [address] [phone]  [facility]  [address] [place] [address] [phone]  [facility]  [address], [place] [address] [phone]   Damita Dunnings, MSW, LCSW-A  10:17 AM 11/09/2023

## 2023-11-09 NOTE — Progress Notes (Signed)
Per [facility] Edythe Clarity, RN pt is under review PENDING 11/09/2023 discharges. 1st shift CSW to follow up with [facility].   Care Team notified: [facility] Zachary George, Teneka Striblin,LCSWA, Hansel Starling, RN   Maryjean Ka, MSW, [facility] 11/09/2023 12:52 AM

## 2023-11-09 NOTE — ED Notes (Signed)
Patient refused Depakote 500 mg stating "He was told he was taking 250 mg. ". No agitation noted. Patient calm at this time. Will continue to monitor patient. Safety maintain.

## 2023-11-09 NOTE — ED Provider Notes (Signed)
Rounding, patient denies pain or injury. Patient does not appear to be in acute distress. No injury noted.

## 2023-11-09 NOTE — ED Notes (Signed)
Patient discharge via ambulatory with a steady gait with [employer]. staff member. Respirations equal and unlabored, skin warm and dry. No acute distress noted. Belongings returned from locker 25 intact.

## 2023-11-09 NOTE — ED Notes (Signed)
[facility] [employer] contacted for patient transport to [facility]. 

## 2023-11-09 NOTE — ED Notes (Signed)
Patient in bed resting. Rise and fall of chest noted.

## 2023-11-09 NOTE — ED Notes (Signed)
Pt sleeping at present, no distress noted.  Respirations even & Unlabored.  Monitoring for safety. °

## 2023-11-10 ENCOUNTER — Encounter (HOSPITAL_COMMUNITY): Payer: Self-pay | Admitting: Psychiatric/Mental Health

## 2023-11-10 ENCOUNTER — Other Ambulatory Visit: Payer: Self-pay

## 2023-11-10 MED ORDER — DIVALPROEX SODIUM 250 MG PO DR TAB
250.0000 mg | DELAYED_RELEASE_TABLET | Freq: Two times a day (BID) | ORAL | Status: DC
Start: 2023-11-10 — End: 2023-11-13
  Administered 2023-11-10 – 2023-11-13 (×7): 250 mg via ORAL
  Filled 2023-11-10 (×10): qty 1

## 2023-11-10 MED ORDER — LORAZEPAM 2 MG/ML IJ SOLN: 2.0000 mg | Freq: Three times a day (TID) | INTRAMUSCULAR | Status: DC | PRN

## 2023-11-10 MED ORDER — MAGNESIUM HYDROXIDE 400 MG/5ML PO SUSP: 30.0000 mL | Freq: Every day | ORAL | Status: DC | PRN

## 2023-11-10 MED ORDER — HALOPERIDOL 5 MG PO TABS
5.0000 mg | ORAL_TABLET | Freq: Every day | ORAL | Status: DC
  Administered 2023-11-10 – 2023-11-20 (×11): 5 mg via ORAL
  Filled 2023-11-10 (×14): qty 1

## 2023-11-10 MED ORDER — ACETAMINOPHEN 325 MG PO TABS: 650.0000 mg | ORAL_TABLET | Freq: Four times a day (QID) | ORAL | Status: DC | PRN

## 2023-11-10 MED ORDER — HALOPERIDOL 5 MG PO TABS
5.0000 mg | ORAL_TABLET | Freq: Every day | ORAL | Status: DC
  Filled 2023-11-10: qty 1

## 2023-11-10 MED ORDER — HALOPERIDOL LACTATE 5 MG/ML IJ SOLN: 10.0000 mg | Freq: Three times a day (TID) | INTRAMUSCULAR | Status: DC | PRN

## 2023-11-10 MED ORDER — TRAZODONE HCL 50 MG PO TABS
50.0000 mg | ORAL_TABLET | Freq: Every evening | ORAL | Status: DC | PRN
  Administered 2023-11-10 – 2023-11-20 (×10): 50 mg via ORAL
  Filled 2023-11-10 (×10): qty 1

## 2023-11-10 MED ORDER — PALIPERIDONE ER 6 MG PO TB24: 6.0000 mg | ORAL_TABLET | Freq: Every day | ORAL | Status: DC

## 2023-11-10 MED ORDER — ALUM & MAG HYDROXIDE-SIMETH 200-200-20 MG/5ML PO SUSP
30.0000 mL | ORAL | Status: DC | PRN
  Administered 2023-11-15 – 2023-11-20 (×4): 30 mL via ORAL
  Filled 2023-11-10 (×4): qty 30

## 2023-11-10 MED ORDER — HYDROXYZINE HCL 25 MG PO TABS
25.0000 mg | ORAL_TABLET | Freq: Three times a day (TID) | ORAL | Status: DC | PRN
  Administered 2023-11-10 – 2023-11-19 (×4): 25 mg via ORAL
  Filled 2023-11-10 (×4): qty 1

## 2023-11-10 MED ORDER — HALOPERIDOL LACTATE 5 MG/ML IJ SOLN: 5.0000 mg | Freq: Three times a day (TID) | INTRAMUSCULAR | Status: DC | PRN

## 2023-11-10 MED ORDER — DIPHENHYDRAMINE HCL 50 MG/ML IJ SOLN: 50.0000 mg | Freq: Three times a day (TID) | INTRAMUSCULAR | Status: DC | PRN

## 2023-11-10 MED ORDER — HALOPERIDOL 5 MG PO TABS
5.0000 mg | ORAL_TABLET | Freq: Three times a day (TID) | ORAL | Status: DC | PRN
  Filled 2023-11-10: qty 1

## 2023-11-10 MED ORDER — BENZTROPINE MESYLATE 2 MG PO TABS
2.0000 mg | ORAL_TABLET | Freq: Every day | ORAL | Status: DC
  Administered 2023-11-10 – 2023-11-18 (×9): 2 mg via ORAL
  Filled 2023-11-10 (×11): qty 1

## 2023-11-10 MED ORDER — DIPHENHYDRAMINE HCL 25 MG PO CAPS
50.0000 mg | ORAL_CAPSULE | Freq: Three times a day (TID) | ORAL | Status: DC | PRN
  Administered 2023-11-11: 50 mg via ORAL
  Filled 2023-11-10: qty 2

## 2023-11-10 NOTE — Progress Notes (Signed)
   11/10/23 0020  Psych Admission Type (Psych Patients Only)  Admission Status Involuntary  Psychosocial Assessment  Patient Complaints Anxiety  Eye Contact Fair  Facial Expression Anxious  Affect Blunted  Speech Loud  Interaction Assertive  Motor Activity Pacing  Appearance/Hygiene In scrubs  Behavior Characteristics Cooperative  Mood Anxious  Aggressive Behavior  Effect No apparent injury  Thought Process  Coherency Tangential  Content Paranoia  Delusions Paranoid  Perception Depersonalization  Hallucination Auditory  Judgment Poor  Confusion None  Danger to Self  Current suicidal ideation? Denies  Description of Suicide Plan Si with plan to hang self  Agreement Not to Harm Self Yes  Description of Agreement verbal  Danger to Others  Danger to Others None reported or observed

## 2023-11-10 NOTE — Plan of Care (Signed)

## 2023-11-10 NOTE — [facility] Suicide Risk Assessment (Signed)
Suicide Risk Assessment  Admission Assessment    [facility] Admission Suicide Risk Assessment   Nursing information obtained from:  Patient Demographic factors:  Male, Adolescent or young adult, Unemployed Current Mental Status:  Suicidal ideation indicated by patient, Suicide plan, Thoughts of violence towards others, Belief that plan would result in death Loss Factors:  NA Historical Factors:  Prior suicide attempts, Family history of mental illness or substance abuse, Impulsivity Risk Reduction Factors:  Religious beliefs about death, Living with another person, especially a relative, Positive social support  Total Time spent with patient: 45 minutes Principal Problem: Schizoaffective disorder (HCC) Diagnosis:  Principal Problem:   Schizoaffective disorder (HCC)   Subjective Data:   Mario Thomas is a 22 y.o. male  with a past psychiatric history of schizoaffective disorder, bipolar type. Patient initially arrived to [facility] on 11/29 for crisis stabilization, and admitted to [facility] under [facility] on 12/1 for acute safety concerns, crisis stabalization, impaired functioning, homicidal behaviors, and stabilization of acute on chronic psychiatric conditions.  This is his fifth presentation to [facility] over the span of 2 years.  PMHx is nonsignificant..   Continued Clinical Symptoms:  Alcohol Use Disorder Identification Test Final Score (AUDIT): 7 The "Alcohol Use Disorders Identification Test", Guidelines for Use in Primary Care, Second Edition.  World Science writer [facility]). Score between 0-7:  no or low risk or alcohol related problems. Score between 8-15:  moderate risk of alcohol related problems. Score between 16-19:  high risk of alcohol related problems. Score 20 or above:  warrants further diagnostic evaluation for alcohol dependence and treatment.   CLINICAL FACTORS:   Bipolar Disorder:   Mixed State Depression:   Delusional Impulsivity Insomnia Severe Schizophrenia:   Depressive  state Less than 46 years old Paranoid or undifferentiated type More than one psychiatric diagnosis Currently Psychotic Unstable or Poor Therapeutic Relationship Previous Psychiatric Diagnoses and Treatments   Psychiatric Specialty Exam:   Presentation    General Appearance:  Disheveled   Eye Contact:  Fair   Speech:  Clear and Coherent   Speech Volume:  Decreased   Handedness:  Left     Mood and Affect    Mood:  Depressed   Affect:  Congruent     Thinking     Thought Processes:  Coherent   Descriptions of Associations:  Circumstantial   Orientation:  Full (Time, Place and Person)   Thought Content:  Logical   History of Schizophrenia/Schizoaffective disorder:  Yes     Duration of Psychotic Symptoms: While patient is a poor historian, appears to have been undergoing auditory hallucinations for some time.   Hallucinations:  Other (comment) (UTA) None   Ideas of Reference:  None   Suicidal Thoughts:  No Without Intent; Without Plan   Homicidal Thoughts:  No     Sensorium      Memory:  Immediate Fair   Judgment:  Impaired   Insight:  Lacking     Executive Functions      Concentration:  Poor   Attention Span:  Poor   Recall:  [employer] of Knowledge:  Fair   Language:  Fair     Musculoskeletal:   Strength & muscle tone: within normal limits Gait & station: normal Patient leans: N/A   Psychomotor Activity:  Restlessness       Assets:  Manufacturing systems engineer; Desire for Improvement; Financial Resources/Insurance; Housing; Leisure Time; Physical Health; Social Support; Transportation       Sleep:  Poor 2  Physical Exam Constitutional:      General: He is not in acute distress.    Appearance: He is not ill-appearing.  HENT:     Head: Normocephalic and atraumatic.     Nose: No rhinorrhea.     Mouth/Throat:     Pharynx: No oropharyngeal exudate or posterior oropharyngeal erythema.  Eyes:      Extraocular Movements: Extraocular movements intact.  Pulmonary:     Effort: Pulmonary effort is normal. No respiratory distress.  Abdominal:     General: There is no distension.     Palpations: Abdomen is soft.  Musculoskeletal:        General: No deformity. Normal range of motion.     Cervical back: Normal range of motion.  Skin:    General: Skin is warm and dry.  Neurological:     Mental Status: He is alert and oriented to person, place, and time. Mental status is at baseline.      Review of Systems  All other systems reviewed and are negative.   Blood pressure 130/64, pulse 80, temperature 98.2 F (36.8 C), temperature source Oral, resp. rate 18, height 5\' 11"  (1.803 m), weight 88.5 kg, SpO2 100%. Body mass index is 27.2 kg/m.   COGNITIVE FEATURES THAT CONTRIBUTE TO RISK:  Closed-mindedness and Loss of executive function    SUICIDE RISK:  Severe:  Frequent enduring suicidal ideation, specific plan, no subjective intent, but some objective markers of intent (i.e., choice of lethal method), the method is accessible, some limited preparatory behavior, evidence of impaired self-control, severe dysphoria/symptomatology, multiple risk factors present, and few if any protective factors, particularly a lack of social support.  PLAN OF CARE: See H&P for assessment and plan.   I certify that inpatient services furnished can reasonably be expected to improve the patient's condition.   Tomie China, MD 11/10/2023, 2:42 PM
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
## 2023-11-10 NOTE — Tx Team (Signed)
Initial Treatment Plan 11/10/2023 6:23 AM [name] ZOX:[id number]    PATIENT STRESSORS: Health problems   Medication change or noncompliance     PATIENT STRENGTHS: Ability for insight  Active sense of humor  Physical Health  Supportive family/friends    PATIENT IDENTIFIED PROBLEMS: Depression  Anxiety  Medication noncompliance                 DISCHARGE CRITERIA:  Ability to meet basic life and health needs Improved stabilization in mood, thinking, and/or behavior Reduction of life-threatening or endangering symptoms to within safe limits Verbal commitment to aftercare and medication compliance  PRELIMINARY DISCHARGE PLAN: Attend aftercare/continuing care group Return to previous living arrangement Return to previous work or school arrangements  PATIENT/FAMILY INVOLVEMENT: This treatment plan has been presented to and reviewed with the patient, Mario Thomas.  The patient has been given the opportunity to ask questions and make suggestions.  Lahoma Crocker, RN 11/10/2023, 6:23 AM
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
## 2023-11-11 ENCOUNTER — Encounter (HOSPITAL_COMMUNITY): Payer: Self-pay

## 2023-11-11 MED ORDER — WHITE PETROLATUM EX OINT
TOPICAL_OINTMENT | CUTANEOUS | Status: AC
  Administered 2023-11-11: 1
  Filled 2023-11-11: qty 5

## 2023-11-11 MED ORDER — MELATONIN 3 MG PO TABS
3.0000 mg | ORAL_TABLET | Freq: Every day | ORAL | Status: DC
  Administered 2023-11-11 – 2023-11-20 (×10): 3 mg via ORAL
  Filled 2023-11-11 (×13): qty 1

## 2023-11-11 NOTE — Group Note (Signed)
LCSW Group Therapy Note   Group Date: 11/11/2023 Start Time: 1300 End Time: 1400   Type of Therapy and Topic:  Group Therapy - Who Am I?  Participation Level:  Active   Description of Group The focus of this group was to aid patients in self-exploration and awareness. Patients were guided in exploring various factors of oneself to include interests, readiness to change, management of emotions, and individual perception of self. Patients were provided with complementary worksheets exploring hidden talents, ease of asking other for help, music/media preferences, understanding and responding to feelings/emotions, and hope for the future. At group closing, patients were encouraged to adhere to discharge plan to assist in continued self-exploration and understanding.  Therapeutic Goals Patients learned that self-exploration and awareness is an ongoing process Patients identified their individual skills, preferences, and abilities Patients explored their openness to establish and confide in supports Patients explored their readiness for change and progression of mental health   Summary of Patient Progress:  Patient actively engaged in introductory check-in. Patient actively engaged in activity of self-exploration and identification,  completing complementary worksheet to assist in discussion. Patient identified various factors ranging from hidden talents, favorite music and movies, trusted individuals, accountability, and individual perceptions of self and hope. Pt engaged in processing thoughts and feelings as well as means of reframing thoughts. Pt proved receptive of alternate group members input and feedback from CSW.   Therapeutic Modalities Cognitive Behavioral Therapy Motivational Interviewing  Kathrynn Humble 11/11/2023  2:31 PM

## 2023-11-11 NOTE — Progress Notes (Signed)
   11/11/23 2030  Psych Admission Type (Psych Patients Only)  Admission Status Involuntary  Psychosocial Assessment  Patient Complaints Anxiety  Eye Contact Fair  Facial Expression Anxious  Affect Sad  Speech Logical/coherent  Interaction Assertive  Motor Activity Restless;Pacing  Appearance/Hygiene Unremarkable  Behavior Characteristics Cooperative  Mood Anxious  Aggressive Behavior  Effect No apparent injury  Thought Process  Coherency Tangential  Content Paranoia  Delusions Paranoid  Perception Depersonalization  Hallucination Auditory  Judgment Poor  Confusion None  Danger to Self  Current suicidal ideation? Denies

## 2023-11-11 NOTE — Plan of Care (Signed)
  Problem: Education: Goal: Emotional status will improve Outcome: Progressing Goal: Mental status will improve Outcome: Progressing  Patient denies SI/HI/A/VH endorses anxiety at HS and requested for Trazodone for sleep. Prn Atarax for anxiety given and Trazodone for sleep. Medications were effective Patient slept until 0100 work up C/O sleep disturbances and irritability and requested for Benadryl. Medications given Per Patient request. Q 15 minutes safety checks ongoing. Patient remains safe.

## 2023-11-11 NOTE — Progress Notes (Signed)
Pt has an interview with [facility] services on 11/12/2023 after 12:00 pm. Pt made aware.

## 2023-11-11 NOTE — BH IP Treatment Plan (Signed)
Interdisciplinary Treatment and Diagnostic Plan Update  11/11/2023 Time of Session: 11:15AM Mario Thomas MRN: [id number]  Principal Diagnosis: Schizoaffective disorder (HCC)  Secondary Diagnoses: Principal Problem:   Schizoaffective disorder (HCC)   Current Medications:  Current Facility-Administered Medications  Medication Dose Route Frequency Provider Last Rate Last Admin   acetaminophen (TYLENOL) tablet 650 mg  650 mg Oral Q6H PRN Jearld Lesch, NP       alum & mag hydroxide-simeth (MAALOX/MYLANTA) 200-200-20 MG/5ML suspension 30 mL  30 mL Oral Q4H PRN Durwin Nora, Rashaun M, NP       benztropine (COGENTIN) tablet 2 mg  2 mg Oral Daily Durwin Nora, Rashaun M, NP   2 mg at 11/11/23 9147   haloperidol (HALDOL) tablet 5 mg  5 mg Oral TID PRN Jearld Lesch, NP       And   diphenhydrAMINE (BENADRYL) capsule 50 mg  50 mg Oral TID PRN Jearld Lesch, NP   50 mg at 11/11/23 0132   haloperidol lactate (HALDOL) injection 5 mg  5 mg Intramuscular TID PRN Jearld Lesch, NP       And   diphenhydrAMINE (BENADRYL) injection 50 mg  50 mg Intramuscular TID PRN Jearld Lesch, NP       And   LORazepam (ATIVAN) injection 2 mg  2 mg Intramuscular TID PRN Jearld Lesch, NP       haloperidol lactate (HALDOL) injection 10 mg  10 mg Intramuscular TID PRN Jearld Lesch, NP       And   diphenhydrAMINE (BENADRYL) injection 50 mg  50 mg Intramuscular TID PRN Jearld Lesch, NP       And   LORazepam (ATIVAN) injection 2 mg  2 mg Intramuscular TID PRN Jearld Lesch, NP       divalproex (DEPAKOTE) DR tablet 250 mg  250 mg Oral Q12H Dixon, Rashaun M, NP   250 mg at 11/11/23 8295   haloperidol (HALDOL) tablet 5 mg  5 mg Oral QHS Thalia Party, MD   5 mg at 11/10/23 2037   hydrOXYzine (ATARAX) tablet 25 mg  25 mg Oral TID PRN Jearld Lesch, NP   25 mg at 11/10/23 2036   magnesium hydroxide (MILK OF MAGNESIA) suspension 30 mL  30 mL Oral Daily PRN Jearld Lesch, NP       traZODone (DESYREL) tablet 50  mg  50 mg Oral QHS PRN Jearld Lesch, NP   50 mg at 11/10/23 2037   PTA Medications: Medications Prior to Admission  Medication Sig Dispense Refill Last Dose   benztropine (COGENTIN) 2 MG tablet Take 1 tablet (2 mg total) by mouth daily. 30 tablet 0    divalproex (DEPAKOTE) 250 MG DR tablet Take 1 tablet (250 mg total) by mouth every 12 (twelve) hours. 30 tablet 0    haloperidol (HALDOL) 2 MG tablet Take 1 tablet (2 mg total) by mouth 2 (two) times daily for 15 days. 30 tablet 0    traZODone (DESYREL) 50 MG tablet Take 1 tablet (50 mg total) by mouth at bedtime. 30 tablet 0     Patient Stressors: Health problems   Medication change or noncompliance    Patient Strengths: Ability for insight  Active sense of humor  Physical Health  Supportive family/friends   Treatment Modalities: Medication Management, Group therapy, Case management,  1 to 1 session with clinician, Psychoeducation, Recreational therapy.   Physician Treatment Plan for Primary Diagnosis: Schizoaffective disorder [facility]) Long Term Goal(s):  Short Term Goals: Ability to identify changes in lifestyle to reduce recurrence of condition will improve Ability to verbalize feelings will improve Ability to disclose and discuss suicidal ideas Ability to demonstrate self-control will improve Ability to identify and develop effective coping behaviors will improve Ability to maintain clinical measurements within normal limits will improve Compliance with prescribed medications will improve Ability to identify triggers associated with substance abuse/mental health issues will improve  Medication Management: Evaluate patient's response, side effects, and tolerance of medication regimen.  Therapeutic Interventions: 1 to 1 sessions, Unit Group sessions and Medication administration.  Evaluation of Outcomes: Not Progressing  Physician Treatment Plan for Secondary Diagnosis: Principal Problem:   Schizoaffective disorder  (HCC)  Long Term Goal(s):     Short Term Goals: Ability to identify changes in lifestyle to reduce recurrence of condition will improve Ability to verbalize feelings will improve Ability to disclose and discuss suicidal ideas Ability to demonstrate self-control will improve Ability to identify and develop effective coping behaviors will improve Ability to maintain clinical measurements within normal limits will improve Compliance with prescribed medications will improve Ability to identify triggers associated with substance abuse/mental health issues will improve     Medication Management: Evaluate patient's response, side effects, and tolerance of medication regimen.  Therapeutic Interventions: 1 to 1 sessions, Unit Group sessions and Medication administration.  Evaluation of Outcomes: Not Progressing   RN Treatment Plan for Primary Diagnosis: Schizoaffective disorder (HCC) Long Term Goal(s): Knowledge of disease and therapeutic regimen to maintain health will improve  Short Term Goals: Ability to remain free from injury will improve, Ability to verbalize frustration and anger appropriately will improve, Ability to demonstrate self-control, Ability to participate in decision making will improve, Ability to verbalize feelings will improve, Ability to disclose and discuss suicidal ideas, Ability to identify and develop effective coping behaviors will improve, and Compliance with prescribed medications will improve  Medication Management: RN will administer medications as ordered by provider, will assess and evaluate patient's response and provide education to patient for prescribed medication. RN will report any adverse and/or side effects to prescribing provider.  Therapeutic Interventions: 1 on 1 counseling sessions, Psychoeducation, Medication administration, Evaluate responses to treatment, Monitor vital signs and CBGs as ordered, Perform/monitor CIWA, COWS, AIMS and Fall Risk  screenings as ordered, Perform wound care treatments as ordered.  Evaluation of Outcomes: Not Progressing   LCSW Treatment Plan for Primary Diagnosis: Schizoaffective disorder (HCC) Long Term Goal(s): Safe transition to appropriate next level of care at discharge, Engage patient in therapeutic group addressing interpersonal concerns.  Short Term Goals: Engage patient in aftercare planning with referrals and resources, Increase social support, Increase ability to appropriately verbalize feelings, Increase emotional regulation, Facilitate acceptance of mental health diagnosis and concerns, Facilitate patient progression through stages of change regarding substance use diagnoses and concerns, Identify triggers associated with mental health/substance abuse issues, and Increase skills for wellness and recovery  Therapeutic Interventions: Assess for all discharge needs, 1 to 1 time with Social worker, Explore available resources and support systems, Assess for adequacy in community support network, Educate family and significant other(s) on suicide prevention, Complete Psychosocial Assessment, Interpersonal group therapy.  Evaluation of Outcomes: Not Progressing   Progress in Treatment: Attending groups: Yes. Participating in groups: Yes. Taking medication as prescribed: Yes. Toleration medication: Yes. Family/Significant other contact made: No, will contact:  Tamala Ser (Father) [phone] Meseret (Mother) [phone] Junius Roads (Sister) [phone] Magarsa (Relative) [phone] Patient understands diagnosis: No. Discussing patient identified problems/goals with staff: Yes. Medical problems stabilized  or resolved: Yes. Denies suicidal/homicidal ideation: Yes. Issues/concerns per patient self-inventory: No.   New problem(s) identified: No, Describe:  NONE  New Short Term/Long Term Goal(s): Medication stabilization, elimination of SI thoughts, development of comprehensive mental wellness plan.     Patient Goals:  "Lower my anxiety, pray more and communicate better"  Discharge Plan or Barriers: Patient recently admitted. CSW will continue to follow and assess for appropriate referrals and possible discharge planning.    Reason for Continuation of Hospitalization: Homicidal ideation Medication stabilization Suicidal ideation  Estimated Length of Stay: 5-7 days  Last 3 [place] Suicide Severity Risk Score: Flowsheet Row [facility]Current[facility] Most recent reading at 11/10/2023 12:20 AM ED from 11/08/2023 in [facility] Most recent reading at 11/08/2023  5:41 PM ED from 11/08/2023 in [facility] Emergency [facility] Most recent reading at 11/08/2023  9:00 AM  C-SSRS RISK CATEGORY High Risk High Risk No Risk       Last PHQ 2/9 Scores:    11/14/2022    6:55 PM 11/27/2019    9:41 AM 08/04/2018    9:03 AM  Depression screen PHQ 2/9  Decreased Interest -- 0 0  Down, Depressed, Hopeless -- 0 0  PHQ - 2 Score  0 0    Scribe for Treatment Team: Esmeralda Arthur 11/11/2023 12:34 PM
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
## 2023-11-11 NOTE — Plan of Care (Signed)
  Problem: Education: Goal: Emotional status will improve Outcome: Progressing Goal: Mental status will improve Outcome: Progressing   Problem: Activity: Goal: Interest or engagement in activities will improve Outcome: Progressing Goal: Sleeping patterns will improve Outcome: Progressing   Problem: Safety: Goal: Periods of time without injury will increase Outcome: Progressing   

## 2023-11-11 NOTE — Group Note (Signed)
Date:  11/11/2023 Time:  9:58 AM  Group Topic/Focus:  Goals Group:   The focus of this group is to help patients establish daily goals to achieve during treatment and discuss how the patient can incorporate goal setting into their daily lives to aide in recovery. Orientation:   The focus of this group is to educate the patient on the purpose and policies of crisis stabilization and provide a format to answer questions about their admission.  The group details unit policies and expectations of patients while admitted.    Participation Level:  Active  Participation Quality:  Attentive  Affect:  Appropriate  Cognitive:  Appropriate  Insight: Good  Engagement in Group:  Engaged  Modes of Intervention:  Discussion  Additional Comments:  Patient attended group today and was attentive the duration of it. Patient's goal was to attend all groups and take his medication.   Xoey Warmoth T Lorraine Lax 11/11/2023, 9:58 AM

## 2023-11-11 NOTE — Progress Notes (Signed)
Recreation Therapy Notes  INPATIENT RECREATION THERAPY ASSESSMENT  Patient Details Name: Mario Thomas MRN: [id number] DOB: 06-01-2001 Today's Date: 11/11/2023       Information Obtained From: Patient  Able to Participate in Assessment/Interview: Yes  Patient Presentation: Alert  Reason for Admission (Per Patient): Suicidal Ideation, Other (Comments) (Pt also stated he had a disagreement with his parents.)  Patient Stressors: Other (Comment) ("my future")  Coping Skills:   Isolation, Journal, TV, Aggression, Music, Exercise, Meditate, Talk, Prayer, Avoidance, Hot Bath/Shower  Leisure Interests (2+):  Individual - Other (Comment) (Pray; Ask questions/find answers online and Youtube; Wonder/Dream)  Frequency of Recreation/Participation: Other (Comment) (Pray, Dream/Wonder- Daily; Ask questions-Occationally)  Awareness of Community Resources:  Yes  Community Resources:  [place], [facility], [employer], Public affairs consultant  Current Use: Yes  If no, Barriers?:    Expressed Interest in [employer] Information: No  [employer] of Residence:  [place]  Patient Main Form of Transportation: Set designer  Patient Strengths:  Learn from mistakes, try to improve  Patient Identified Areas of Improvement:  Temper, Being patient  Patient Goal for Hospitalization:  "just talk to people, learn different things, reflecting on past behaviors that got me here"  Current SI (including self-harm):  No  Current HI:  No  Current AVH: No  Staff Intervention Plan: Group Attendance, Collaborate with Interdisciplinary Treatment Team  Consent to Intern Participation: N/A   Nayzeth Altman-McCall, LRT,CTRS Nunzio Banet A Ima Hafner-McCall 11/11/2023, 2:17 PM

## 2023-11-11 NOTE — Group Note (Signed)
Recreation Therapy Group Note   Group Topic:Healthy Decision Making  Group Date: 11/11/2023 Start Time: 1039 End Time: 1105 Facilitators: Rileyann Florance-McCall, LRT,CTRS Location: [address] Dayroom   Group Topic: Decision Making, Problem Solving, Communication  Goal Area(s) Addresses:  Patient will effectively work with peer towards shared goal.  Patient will identify factors that guided their decision making.  Patient will pro-socially communicate ideas during group session.   Intervention: Survival Scenario - pencil, paper  Group Description: Patients were given a scenario that they were going to be stranded on a deserted [place] for several months before being rescued. Writer tasked them with making a list of 15 things they would choose to bring with them for "survival". The list of items was prioritized most important to least. Each patient would come up with their own list, then work together to create a new list of 15 items while in a group of 3-5 peers. LRT discussed each person's list and how it differed from others. The debrief included discussion of priorities, good decisions versus bad decisions, and how it is important to think before acting so we can make the best decision possible. LRT tied the concept of effective communication among group members to patient's support systems outside of the hospital and its benefit post discharge.  Education: Pharmacist, community, Priorities, Support System, Discharge Planning   Education Outcome: Acknowledges education/In group clarification/Needs additional education   Affect/Mood: Appropriate   Participation Level: Engaged   Participation Quality: Independent   Behavior: Appropriate   Speech/Thought Process: Focused   Insight: Good   Judgement: Good   Modes of Intervention: Activity   Patient Response to Interventions:  Engaged   Education Outcome:  In group clarification offered    Clinical Observations/Individualized  Feedback: Pt was engaged and focused during group session. Pt was appropriate during group and on task. Pt identified some items for his personal list as follows: bug spray, bible, electrolytes/Gatorade, protein, tent, shoes, downloaded music, etc. Pt went on to work with peers to create a joint list that would help them survive on [address] such as games, water, rope, axe,etc.      Plan: Continue to engage patient in RT group sessions 2-3x/week.   Rayma Hegg-McCall, LRT,CTRS 11/11/2023 1:39 PM

## 2023-11-11 NOTE — Progress Notes (Signed)
   11/11/23 0900  Psych Admission Type (Psych Patients Only)  Admission Status Involuntary  Psychosocial Assessment  Patient Complaints Anxiety  Eye Contact Fair  Facial Expression Anxious  Affect Blunted  Speech Logical/coherent  Interaction Assertive  Motor Activity Restless  Appearance/Hygiene Unremarkable  Behavior Characteristics Cooperative  Mood Anxious  Thought Process  Coherency Tangential  Content Paranoia  Delusions Paranoid  Perception Depersonalization  Hallucination Auditory  Judgment Poor  Confusion None  Danger to Self  Current suicidal ideation? Denies  Self-Injurious Behavior No self-injurious ideation or behavior indicators observed or expressed   Agreement Not to Harm Self Yes  Description of Agreement verbal  Danger to Others  Danger to Others None reported or observed

## 2023-11-12 NOTE — Progress Notes (Signed)
[facility] present on unit today to complete initial assessment for services. ACTT reported they are able to provide services for pt upon discharge. Pt in agreement with plan.

## 2023-11-12 NOTE — Progress Notes (Signed)
Pt's ACT team here at this time to speak with him in his room.  No distress noted.
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
## 2023-11-12 NOTE — Progress Notes (Signed)
   11/12/23 1100  Psych Admission Type (Psych Patients Only)  Admission Status Involuntary  Psychosocial Assessment  Patient Complaints Anxiety  Eye Contact Fair  Facial Expression Pensive  Affect Preoccupied  Speech Logical/coherent  Interaction Forwards little;Guarded  Motor Activity Slow  Appearance/Hygiene Unremarkable  Behavior Characteristics Cooperative  Mood Pleasant  Aggressive Behavior  Effect No apparent injury  Thought Process  Coherency WDL  Content Blaming self  Delusions None reported or observed  Perception WDL  Hallucination None reported or observed  Judgment WDL  Confusion WDL  Danger to Self  Current suicidal ideation? Denies  Agreement Not to Harm Self Yes  Description of Agreement verbal  Danger to Others  Danger to Others None reported or observed

## 2023-11-12 NOTE — Plan of Care (Signed)

## 2023-11-12 NOTE — Group Note (Signed)
Recreation Therapy Group Note   Group Topic:Health and Wellness  Group Date: 11/12/2023 Start Time: 1045 End Time: 1110 Facilitators: Dorse Locy-McCall, LRT,CTRS Location: [address] Dayroom   Group Topic: Wellness  Goal Area(s) Addresses:  Patient will define components of whole wellness. Patient will verbalize benefit of whole wellness.  Intervention: Music, Calm App  Group Description: LRT and patients discussed the importance of wellness and how it affects the body. Patients were to lead the group in the exercises of their choosing. The exercises could be as strenuous or easy as the patient chooses. LRT then led the group in a meditation that focused on being resilient in face of adversity.    Education: Wellness, Building control surveyor.   Education Outcome: Acknowledges education/In group clarification offered/Needs additional education.    Affect/Mood: Appropriate   Participation Level: Engaged   Participation Quality: Independent   Behavior: Appropriate   Speech/Thought Process: Focused   Insight: Good   Judgement: Good   Modes of Intervention: Music and Meditation   Patient Response to Interventions:  Engaged   Education Outcome:  In group clarification offered    Clinical Observations/Individualized Feedback: Pt was bright and engaged. Pt led group in a few stretches and punches. Pt also actively participated in the meditation. Once completed, pt stated the meditation made him feel "seen and understood" that someone knew how he felt.     Plan: Continue to engage patient in RT group sessions 2-3x/week.   Mario Thomas, LRT,CTRS 11/12/2023 1:37 PM

## 2023-11-12 NOTE — [facility] Group Notes (Signed)
[facility] Group Notes:  (Nursing/MHT/Case Management/Adjunct)    Insight:  Adult Psychoeducational Group Note  Date:  11/12/2023 Time:  4:24 PM  Group Topic/Focus:  Goals Group:   The focus of this group is to help patients establish daily goals to achieve during treatment and discuss how the patient can incorporate goal setting into their daily lives to aide in recovery.  Participation Level:  Active  Participation Quality:  Appropriate  Affect:  Appropriate  Cognitive:  Appropriate  Insight: Good  Engagement in Group:  Engaged  Modes of Intervention:  Discussion  Additional Comments:  Pt actively participated in group.

## 2023-11-13 LAB — VALPROIC ACID LEVEL: Valproic Acid Lvl: 26 ug/mL — ABNORMAL LOW (ref 50.0–100.0)

## 2023-11-13 MED ORDER — DIVALPROEX SODIUM 500 MG PO DR TAB
500.0000 mg | DELAYED_RELEASE_TABLET | Freq: Two times a day (BID) | ORAL | Status: DC
  Administered 2023-11-13 – 2023-11-21 (×16): 500 mg via ORAL
  Filled 2023-11-13 (×20): qty 1

## 2023-11-13 NOTE — Group Note (Signed)
Date:  11/13/2023 Time:  9:10 AM  Group Topic/Focus:  Goals Group:   The focus of this group is to help patients establish daily goals to achieve during treatment and discuss how the patient can incorporate goal setting into their daily lives to aide in recovery.    Participation Level:  Active  Participation Quality:  Appropriate  Affect:  Appropriate  Cognitive:  Appropriate  Insight: Good  Engagement in Group:  Engaged  Modes of Intervention:  Discussion  Additional Comments:  Pt's goal was to follow the rules so he can leave.  Donell Beers 11/13/2023, 9:10 AM

## 2023-11-13 NOTE — Plan of Care (Signed)
  Problem: Coping: Goal: Ability to verbalize frustrations and anger appropriately will improve Outcome: Progressing Goal: Ability to demonstrate self-control will improve Outcome: Progressing  Jandel mood and affect is pleasant today interacting well with Peers and Staff. 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 Patient is compliant with medications this shift has not been pacing a lot today. Support and encouragement provided. No medications adverse effects noted.

## 2023-11-13 NOTE — Progress Notes (Signed)
   11/13/23 0800  Psych Admission Type (Psych Patients Only)  Admission Status Involuntary  Psychosocial Assessment  Patient Complaints Anxiety  Eye Contact Fair  Facial Expression Animated  Affect Appropriate to circumstance  Speech Logical/coherent  Interaction Forwards little  Motor Activity Slow  Appearance/Hygiene Unremarkable  Behavior Characteristics Cooperative;Appropriate to situation  Mood Pleasant  Thought Process  Coherency WDL  Content WDL  Delusions None reported or observed  Perception WDL  Hallucination None reported or observed  Judgment WDL  Confusion WDL  Danger to Self  Current suicidal ideation? Denies  Agreement Not to Harm Self Yes  Description of Agreement verbal  Danger to Others  Danger to Others None reported or observed
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
## 2023-11-13 NOTE — B[facility] Group Notes (Signed)
The focus of this group is to help patients review their daily goal of treatment and discuss progress on daily workbooks. Pt was attentive and appropriate during tonight's group discussion wrap up group. Pt stated that he mixed emotions and feeling during the day. Stated that he was able to walk and talk with family. Stated that peers has been supportive.

## 2023-11-13 NOTE — Group Note (Signed)
Recreation Therapy Group Note   Group Topic:Team Building  Group Date: 11/13/2023 Start Time: 1005 End Time: 1045 Facilitators: Dabid Godown-McCall, LRT,CTRS Location: [address] Dayroom   Group Topic: Communication, Team Building, Problem Solving  Goal Area(s) Addresses:  Patient will effectively work with peer towards shared goal.  Patient will identify skills used to make activity successful.  Patient will share challenges and verbalize solution-driven approaches used. Patient will identify how skills used during activity can be used to reach post d/c goals.   Intervention: STEM Activity   Group Description: [employer]. Patients were provided the following materials: 4 drinking straws, 5 rubber bands, 5 paper clips, 2 index cards and 2 drinking cups. Using the provided materials patients were asked to build a launching mechanism to launch a ping pong ball across the room, approximately 10 feet. Patients were divided into teams of 3-5. Instructions required all materials be incorporated into the device, functionality of items left to the peer group's discretion. LRT also allowed patients to select songs to listen to as they worked on [employer].  Education: Pharmacist, community, Scientist, physiological, Air cabin crew, Building control surveyor.   Education Outcome: Acknowledges education/In group clarification offered/Needs additional education.    Affect/Mood: Flat   Participation Level: None   Participation Quality: None   Behavior: Reserved   Speech/Thought Process: None   Insight: None   Judgement: None   Modes of Intervention: Music and STEM Activity   Patient Response to Interventions:  Disengaged   Education Outcome:  In group clarification offered    Clinical Observations/Individualized Feedback: Pt came in for the last part of group. Pt had no interaction with peers. Pt sat quietly and listened to the music.     Plan: Continue to engage patient in RT group sessions  2-3x/week.   Raymir Frommelt-McCall, LRT,CTRS 11/13/2023 1:13 PM

## 2023-11-14 NOTE — Hospital Course (Addendum)
Mom suspects, he sounds better today. Mario Thomas was still suicidal and she was very concerned. Was working in May and was working well for 7 months. Patient was not taking

## 2023-11-14 NOTE — Group Note (Signed)
Recreation Therapy Group Note   Group Topic:Self-Esteem  Group Date: 11/14/2023 Start Time: 1000 End Time: 1030 Facilitators: Dynver Clemson-McCall, LRT,CTRS Location: [address] Dayroom   Group Topic: Self-esteem   Goal Area(s) Addresses:  Patient will identify and write at least one positive trait about themself. Patient will acknowledge the benefit of healthy self-esteem. Patient will endorse understanding of ways to increase self-esteem.    Intervention: Personalized Plate- printed license plate template, markers or colored pencils   Group Description:  LRT began group session with open dialogue asking the patients to define self-esteem and verbally identify positive qualities and traits people may possess. Patients were then instructed to design a personalized license plate, with words and drawings, representing at least 3 positive things about themselves. Pts were encouraged to include favorites, things they are proud of, what they enjoy doing, and dreams for their future. If a patient had a life motto or a meaningful phase that expressed their life values, pt's were asked to incorporate that into their design as well. Patients were given the opportunity to share their completed work with the group.   Education: Healthy self-esteem, Positive character traits, Accepting compliments, Leisure as competence and coping, Support Systems, Discharge planning LRT educated patients on the importance of healthy self-esteem and ways to build self-esteem. LRT addressed discharge planning reviewing positive coping skills and healthy support systems.   Education Outcome:  Acknowledges education/In group clarification offered   Affect/Mood: N/A   Participation Level: Did not attend    Clinical Observations/Individualized Feedback:      Plan: Continue to engage patient in RT group sessions 2-3x/week.   Kammie Scioli-McCall, LRT,CTRS  11/14/2023 11:53 AM

## 2023-11-14 NOTE — Progress Notes (Signed)
   11/14/23 1000  Psych Admission Type (Psych Patients Only)  Admission Status Involuntary  Psychosocial Assessment  Patient Complaints None  Eye Contact Fair  Facial Expression Animated  Affect Appropriate to circumstance  Speech Logical/coherent  Interaction Forwards little  Motor Activity Other (Comment);Pacing (WNL)  Appearance/Hygiene Unremarkable  Behavior Characteristics Appropriate to situation;Cooperative  Mood Pleasant  Thought Process  Coherency WDL  Content WDL  Delusions None reported or observed  Perception WDL  Hallucination None reported or observed  Judgment WDL  Confusion WDL  Danger to Self  Current suicidal ideation? Denies  Agreement Not to Harm Self Yes  Description of Agreement verbal  Danger to Others  Danger to Others None reported or observed
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
## 2023-11-14 NOTE — Group Note (Signed)
Date:  11/14/2023 Time:  9:03 AM  Group Topic/Focus:  Goals Group:   The focus of this group is to help patients establish daily goals to achieve during treatment and discuss how the patient can incorporate goal setting into their daily lives to aide in recovery.    Participation Level:  Did Not Attend   Mario Thomas 11/14/2023, 9:03 AM

## 2023-11-14 NOTE — Group Note (Signed)
Date:  11/14/2023 Time:  9:00 PM  Group Topic/Focus:  Wrap-Up Group:   The focus of this group is to help patients review their daily goal of treatment and discuss progress on daily workbooks.    Additional Comments:  Pt was encouraged, but opted out of attending wrap up group.   Chrisandra Netters 11/14/2023, 9:00 PM

## 2023-11-14 NOTE — Group Note (Signed)
Occupational Therapy Group Note  Group Topic: Sleep Hygiene  Group Date: 11/14/2023 Start Time: 1430 End Time: 1500 Facilitators: Ted Mcalpine, OT   Group Description: Group encouraged increased participation and engagement through topic focused on sleep hygiene. Patients reflected on the quality of sleep they typically receive and identified areas that need improvement. Group was given background information on sleep and sleep hygiene, including common sleep disorders. Group members also received information on how to improve one's sleep and introduced a sleep diary as a tool that can be utilized to track sleep quality over a length of time. Group session ended with patients identifying one or more strategies they could utilize or implement into their sleep routine in order to improve overall sleep quality.        Therapeutic Goal(s):  Identify one or more strategies to improve overall sleep hygiene  Identify one or more areas of sleep that are negatively impacted (sleep too much, too little, etc)     Participation Level: Engaged   Participation Quality: Independent   Behavior: Appropriate   Speech/Thought Process: Relevant   Affect/Mood: Appropriate   Insight: Fair   Judgement: Fair      Modes of Intervention: Education  Patient Response to Interventions:  Attentive   Plan: Continue to engage patient in OT groups 2 - 3x/week.  11/14/2023  Ted Mcalpine, OT   Kerrin Champagne, OT

## 2023-11-14 NOTE — Progress Notes (Signed)
   11/14/23 0600  Psych Admission Type (Psych Patients Only)  Admission Status Involuntary  Psychosocial Assessment  Patient Complaints None  Eye Contact Fair  Facial Expression Animated  Affect Appropriate to circumstance  Speech Logical/coherent  Interaction Assertive  Motor Activity Slow  Appearance/Hygiene Unremarkable  Behavior Characteristics Appropriate to situation;Cooperative  Mood Pleasant  Thought Process  Coherency WDL  Content WDL  Delusions None reported or observed  Perception WDL  Hallucination None reported or observed  Judgment WDL  Confusion WDL  Danger to Self  Current suicidal ideation? Denies  Agreement Not to Harm Self Yes  Description of Agreement verbal  Danger to Others  Danger to Others None reported or observed

## 2023-11-15 ENCOUNTER — Encounter (HOSPITAL_COMMUNITY): Payer: Self-pay

## 2023-11-15 NOTE — Progress Notes (Signed)
   11/15/23 0000  Psych Admission Type (Psych Patients Only)  Admission Status Involuntary  Psychosocial Assessment  Patient Complaints Anxiety  Eye Contact Fair  Facial Expression Other (Comment) (Appropriate to Situation)  Affect Appropriate to circumstance  Speech Logical/coherent  Interaction Assertive  Motor Activity Pacing  Appearance/Hygiene Unremarkable  Behavior Characteristics Cooperative  Mood Pleasant  Thought Process  Coherency WDL  Content WDL  Delusions None reported or observed  Perception WDL  Hallucination None reported or observed  Judgment WDL  Confusion WDL  Danger to Self  Current suicidal ideation? Denies  Agreement Not to Harm Self Yes  Description of Agreement Verbal  Danger to Others  Danger to Others None reported or observed

## 2023-11-15 NOTE — [facility] Group Notes (Signed)
Spirituality group facilitated by Kathleen Argue, B[facility].  Group Description: Group focused on topic of hope. Patients participated in facilitated discussion around topic, connecting with one another around experiences and definitions for hope. Group members engaged with visual explorer photos, reflecting on what hope looks like for them today. Group engaged in discussion around how their definitions of hope are present today in hospital.  Modalities: Psycho-social ed, Adlerian, Narrative, MI  Patient Progress: Mario Thomas attended group and actively engaged and participated in group conversation and activities.  His comments were on topic and appropriate and contributed positively to the group conversation.

## 2023-11-15 NOTE — Discharge Instructions (Signed)

## 2023-11-15 NOTE — Group Note (Unsigned)
Date:  11/15/2023 Time:  9:01 PM  Group Topic/Focus:  Wrap-Up Group:   The focus of this group is to help patients review their daily goal of treatment and discuss progress on daily workbooks.     Participation Level:  {[facility] PARTICIPATION HYQMV:78469}  Participation Quality:  {B[facility] PARTICIPATION QUALITY:22265}  Affect:  {B[facility] AFFECT:22266}  Cognitive:  {[facility] COGNITIVE:22267}  Insight: {BHH Insight2:20797}  Engagement in Group:  {B[facility] ENGAGEMENT IN GEXBM:84132}  Modes of Intervention:  {B[facility] MODES OF INTERVENTION:22269}  Additional Comments:  ***  Scot Dock 11/15/2023, 9:01 PM

## 2023-11-15 NOTE — Group Note (Unsigned)
Date:  11/15/2023 Time:  8:30 PM  Group Topic/Focus:  Wrap-Up Group:   The focus of this group is to help patients review their daily goal of treatment and discuss progress on daily workbooks.     Participation Level:  {B[facility] PARTICIPATION WNUUV:25366}  Participation Quality:  {B[facility] PARTICIPATION QUALITY:22265}  Affect:  {B[facility] AFFECT:22266}  Cognitive:  {B[facility] COGNITIVE:22267}  Insight: {BHH Insight2:20797}  Engagement in Group:  {B[facility] ENGAGEMENT IN YQIHK:74259}  Modes of Intervention:  {B[facility] MODES OF INTERVENTION:22269}  Additional Comments:  ***  Scot Dock 11/15/2023, 8:30 PM

## 2023-11-15 NOTE — BH IP Treatment Plan (Signed)
Interdisciplinary Treatment and Diagnostic Plan Update  11/15/2023 Time of Session: 1:00 Mario Thomas MRN: [id number]  Principal Diagnosis: Schizoaffective disorder (HCC)  Secondary Diagnoses: Principal Problem:   Schizoaffective disorder (HCC)   Current Medications:  Current Facility-Administered Medications  Medication Dose Route Frequency Provider Last Rate Last Admin   acetaminophen (TYLENOL) tablet 650 mg  650 mg Oral Q6H PRN Jearld Lesch, NP       alum & mag hydroxide-simeth (MAALOX/MYLANTA) 200-200-20 MG/5ML suspension 30 mL  30 mL Oral Q4H PRN Durwin Nora, Rashaun M, NP       benztropine (COGENTIN) tablet 2 mg  2 mg Oral Daily Durwin Nora, Rashaun M, NP   2 mg at 11/15/23 0825   haloperidol (HALDOL) tablet 5 mg  5 mg Oral TID PRN Jearld Lesch, NP       And   diphenhydrAMINE (BENADRYL) capsule 50 mg  50 mg Oral TID PRN Jearld Lesch, NP   50 mg at 11/11/23 0132   haloperidol lactate (HALDOL) injection 5 mg  5 mg Intramuscular TID PRN Jearld Lesch, NP       And   diphenhydrAMINE (BENADRYL) injection 50 mg  50 mg Intramuscular TID PRN Jearld Lesch, NP       And   LORazepam (ATIVAN) injection 2 mg  2 mg Intramuscular TID PRN Jearld Lesch, NP       haloperidol lactate (HALDOL) injection 10 mg  10 mg Intramuscular TID PRN Jearld Lesch, NP       And   diphenhydrAMINE (BENADRYL) injection 50 mg  50 mg Intramuscular TID PRN Jearld Lesch, NP       And   LORazepam (ATIVAN) injection 2 mg  2 mg Intramuscular TID PRN Jearld Lesch, NP       divalproex (DEPAKOTE) DR tablet 500 mg  500 mg Oral Q12H Peterson Ao, MD   500 mg at 11/15/23 0825   haloperidol (HALDOL) tablet 5 mg  5 mg Oral QHS Paliy, Serina Cowper, MD   5 mg at 11/14/23 2039   hydrOXYzine (ATARAX) tablet 25 mg  25 mg Oral TID PRN Jearld Lesch, NP   25 mg at 11/11/23 2031   magnesium hydroxide (MILK OF MAGNESIA) suspension 30 mL  30 mL Oral Daily PRN Jearld Lesch, NP       melatonin tablet 3 mg  3 mg Oral  QHS Peterson Ao, MD   3 mg at 11/14/23 2039   traZODone (DESYREL) tablet 50 mg  50 mg Oral QHS PRN Jearld Lesch, NP   50 mg at 11/14/23 2039   PTA Medications: Medications Prior to Admission  Medication Sig Dispense Refill Last Dose   benztropine (COGENTIN) 2 MG tablet Take 1 tablet (2 mg total) by mouth daily. 30 tablet 0    divalproex (DEPAKOTE) 250 MG DR tablet Take 1 tablet (250 mg total) by mouth every 12 (twelve) hours. 30 tablet 0    haloperidol (HALDOL) 2 MG tablet Take 1 tablet (2 mg total) by mouth 2 (two) times daily for 15 days. 30 tablet 0    traZODone (DESYREL) 50 MG tablet Take 1 tablet (50 mg total) by mouth at bedtime. 30 tablet 0     Patient Stressors: Health problems   Medication change or noncompliance    Patient Strengths: Ability for insight  Active sense of humor  Physical Health  Supportive family/friends   Treatment Modalities: Medication Management, Group therapy, Case management,  1 to 1 session  with clinician, Psychoeducation, Recreational therapy.   Physician Treatment Plan for Primary Diagnosis: Schizoaffective disorder (HCC) Long Term Goal(s):     Short Term Goals: Ability to identify changes in lifestyle to reduce recurrence of condition will improve Ability to verbalize feelings will improve Ability to disclose and discuss suicidal ideas Ability to demonstrate self-control will improve Ability to identify and develop effective coping behaviors will improve Ability to maintain clinical measurements within normal limits will improve Compliance with prescribed medications will improve Ability to identify triggers associated with substance abuse/mental health issues will improve  Medication Management: Evaluate patient's response, side effects, and tolerance of medication regimen.  Therapeutic Interventions: 1 to 1 sessions, Unit Group sessions and Medication administration.  Evaluation of Outcomes: Progressing  Physician Treatment Plan  for Secondary Diagnosis: Principal Problem:   Schizoaffective disorder (HCC)  Long Term Goal(s):     Short Term Goals: Ability to identify changes in lifestyle to reduce recurrence of condition will improve Ability to verbalize feelings will improve Ability to disclose and discuss suicidal ideas Ability to demonstrate self-control will improve Ability to identify and develop effective coping behaviors will improve Ability to maintain clinical measurements within normal limits will improve Compliance with prescribed medications will improve Ability to identify triggers associated with substance abuse/mental health issues will improve     Medication Management: Evaluate patient's response, side effects, and tolerance of medication regimen.  Therapeutic Interventions: 1 to 1 sessions, Unit Group sessions and Medication administration.  Evaluation of Outcomes: Progressing   RN Treatment Plan for Primary Diagnosis: Schizoaffective disorder (HCC) Long Term Goal(s): Knowledge of disease and therapeutic regimen to maintain health will improve  Short Term Goals: Ability to disclose and discuss suicidal ideas  Medication Management: RN will administer medications as ordered by provider, will assess and evaluate patient's response and provide education to patient for prescribed medication. RN will report any adverse and/or side effects to prescribing provider.  Therapeutic Interventions: 1 on 1 counseling sessions, Psychoeducation, Medication administration, Evaluate responses to treatment, Monitor vital signs and CBGs as ordered, Perform/monitor CIWA, COWS, AIMS and Fall Risk screenings as ordered, Perform wound care treatments as ordered.  Evaluation of Outcomes: Progressing   LCSW Treatment Plan for Primary Diagnosis: Schizoaffective disorder (HCC) Long Term Goal(s): Safe transition to appropriate next level of care at discharge, Engage patient in therapeutic group addressing interpersonal  concerns.  Short Term Goals: Engage patient in aftercare planning with referrals and resources and Facilitate acceptance of mental health diagnosis and concerns  Therapeutic Interventions: Assess for all discharge needs, 1 to 1 time with Social worker, Explore available resources and support systems, Assess for adequacy in community support network, Educate family and significant other(s) on suicide prevention, Complete Psychosocial Assessment, Interpersonal group therapy.  Evaluation of Outcomes: Progressing   Progress in Treatment: Attending groups: Yes. Participating in groups: Yes. Taking medication as prescribed: Yes. Toleration medication: Yes. Family/Significant other contact made: No, will contact:  Tamala Ser (Father) [phone] Meseret (Mother) [phone] Junius Roads (Sister) [phone] Mag[place]a (Relative) [phone] Patient understands diagnosis: No. Discussing patient identified problems/goals with staff: Yes. Medical problems stabilized or resolved: Yes. Denies suicidal/homicidal ideation: Yes. Issues/concerns per patient self-inventory: No.     New problem(s) identified: No, Describe:  NONE   New Short Term/Long Term Goal(s): Medication stabilization, elimination of SI thoughts, development of comprehensive mental wellness plan.      Patient Goals:  "Lower my anxiety, pray more and communicate better"   Discharge Plan or Barriers: Patient recently admitted. CSW will continue to follow  and assess for appropriate referrals and possible discharge planning.      Reason for Continuation of Hospitalization: Homicidal ideation Medication stabilization Suicidal ideation   Estimated Length of Stay: 3-4 days    Last 3 [place] Suicide Severity Risk Score: Flowsheet Row [facility]Current[facility] Most recent reading at 11/10/2023 12:20 AM ED from 11/08/2023 in [facility] Most recent reading  at 11/08/2023  5:41 PM ED from 11/08/2023 in [facility] Department at [facility] Most recent reading at 11/08/2023  9:00 AM  C-SSRS RISK CATEGORY High Risk High Risk No Risk       Last PHQ 2/9 Scores:    11/14/2022    6:55 PM 11/27/2019    9:41 AM 08/04/2018    9:03 AM  Depression screen PHQ 2/9  Decreased Interest -- 0 0  Down, Depressed, Hopeless -- 0 0  PHQ - 2 Score  0 0    Scribe for Treatment Team: Marinda Elk, LCSW 11/15/2023 2:30 PM

## 2023-11-15 NOTE — Progress Notes (Signed)
   11/15/23 2300  Psych Admission Type (Psych Patients Only)  Admission Status Involuntary  Psychosocial Assessment  Patient Complaints None  Eye Contact Fair  Facial Expression Other (Comment) (Appropriate to Situation)  Affect Appropriate to circumstance  Speech Logical/coherent  Interaction Assertive  Motor Activity Pacing  Appearance/Hygiene Unremarkable  Behavior Characteristics Cooperative;Calm  Mood Pleasant  Thought Process  Coherency WDL  Content WDL  Delusions None reported or observed  Perception WDL  Hallucination None reported or observed  Judgment WDL  Confusion WDL  Danger to Self  Current suicidal ideation? Denies  Agreement Not to Harm Self Yes  Description of Agreement Verbal  Danger to Others  Danger to Others None reported or observed
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
## 2023-11-15 NOTE — Progress Notes (Signed)
   11/15/23 0900  Psych Admission Type (Psych Patients Only)  Admission Status Involuntary  Psychosocial Assessment  Patient Complaints None  Eye Contact Fair  Facial Expression Animated  Affect Appropriate to circumstance  Speech Logical/coherent  Interaction Assertive  Motor Activity Other (Comment) (WNL)  Appearance/Hygiene Unremarkable  Behavior Characteristics Cooperative;Calm  Mood Pleasant  Thought Process  Coherency WDL  Content WDL  Delusions None reported or observed  Perception Depersonalization  Hallucination None reported or observed  Judgment WDL  Confusion WDL  Danger to Self  Current suicidal ideation? Denies  Agreement Not to Harm Self Yes  Description of Agreement verbal  Danger to Others  Danger to Others None reported or observed

## 2023-11-15 NOTE — Group Note (Signed)
Recreation Therapy Group Note   Group Topic:Goal Setting  Group Date: 11/15/2023 Start Time: 1005 End Time: 1030 Facilitators: Lyzette Reinhardt-McCall, LRT,CTRS Location: [address] Dayroom   Group Topic: Goal Setting, Future Planning  Goal Area(s) Addresses:  Patient will identify at least 1 long-term goal for their life.  Patient will reflect on short-term steps they will need to reach their goal(s).  Intervention: Discussion  Group Description: LRT and patients discussed what goals are and how they are used as a guide through life. Patients discussed some of the goals they want to accomplish as the year comes to an end and the coming year. Patients were to also identify any action steps they would use to reach those goals.    Education:  Forward Thinking, Personal Purpose, Goal Setting, Discharge Planning  Education Outcome: Acknowledges Education/In Group Clarification Provided/Needs Additional Education   Affect/Mood: N/A   Participation Level: Did not attend    Clinical Observations/Individualized Feedback:     Plan: Continue to engage patient in RT group sessions 2-3x/week.   Nuel Dejaynes-McCall, LRT,CTRS 11/15/2023 12:15 PM

## 2023-11-15 NOTE — Plan of Care (Signed)
°  Problem: Education: °Goal: Emotional status will improve °Outcome: Progressing °Goal: Mental status will improve °Outcome: Progressing °Goal: Verbalization of understanding the information provided will improve °Outcome: Progressing °  °

## 2023-11-15 NOTE — Plan of Care (Signed)
  Problem: Education: Goal: Emotional status will improve Outcome: Progressing Goal: Mental status will improve Outcome: Progressing Goal: Verbalization of understanding the information provided will improve Outcome: Progressing   Problem: Activity: Goal: Interest or engagement in activities will improve Outcome: Progressing Goal: Sleeping patterns will improve Outcome: Progressing

## 2023-11-15 NOTE — Group Note (Unsigned)
Date:  11/15/2023 Time:  8:46 PM  Group Topic/Focus:  Wrap-Up Group:   The focus of this group is to help patients review their daily goal of treatment and discuss progress on daily workbooks.     Participation Level:  {BHH PARTICIPATION JXBJY:78295}  Participation Quality:  {BHH PARTICIPATION QUALITY:22265}  Affect:  {BHH AFFECT:22266}  Cognitive:  {BHH COGNITIVE:22267}  Insight: {BHH Insight2:20797}  Engagement in Group:  {BHH ENGAGEMENT IN AOZHY:86578}  Modes of Intervention:  {BHH MODES OF INTERVENTION:22269}  Additional Comments:  ***  Scot Dock 11/15/2023, 8:46 PM

## 2023-11-15 NOTE — Plan of Care (Signed)
  Problem: Education: Goal: Knowledge of [facility] Education information/materials will improve Outcome: Progressing Goal: Mental status will improve Outcome: Progressing   Problem: Activity: Goal: Interest or engagement in activities will improve Outcome: Progressing   Problem: Coping: Goal: Ability to verbalize frustrations and anger appropriately will improve Outcome: Progressing

## 2023-11-15 NOTE — Group Note (Unsigned)
Date:  11/15/2023 Time:  8:38 PM  Group Topic/Focus:  Wrap-Up Group:   The focus of this group is to help patients review their daily goal of treatment and discuss progress on daily workbooks.     Participation Level:  {BHH PARTICIPATION ZOXWR:60454}  Participation Quality:  {BHH PARTICIPATION QUALITY:22265}  Affect:  {BHH AFFECT:22266}  Cognitive:  {BHH COGNITIVE:22267}  Insight: {BHH Insight2:20797}  Engagement in Group:  {BHH ENGAGEMENT IN UJWJX:91478}  Modes of Intervention:  {BHH MODES OF INTERVENTION:22269}  Additional Comments:  ***  Scot Dock 11/15/2023, 8:38 PM

## 2023-11-16 LAB — VALPROIC ACID LEVEL: Valproic Acid Lvl: 54 ug/mL (ref 50.0–100.0)

## 2023-11-16 NOTE — Plan of Care (Signed)
  Problem: Education: Goal: Emotional status will improve Outcome: Progressing   Problem: Activity: Goal: Interest or engagement in activities will improve Outcome: Progressing Goal: Sleeping patterns will improve Outcome: Progressing   Problem: Coping: Goal: Ability to verbalize frustrations and anger appropriately will improve Outcome: Progressing Goal: Ability to demonstrate self-control will improve Outcome: Progressing   Problem: Physical Regulation: Goal: Ability to maintain clinical measurements within normal limits will improve Outcome: Progressing   Problem: Safety: Goal: Periods of time without injury will increase Outcome: Progressing

## 2023-11-16 NOTE — BHH Suicide Risk Assessment (Signed)
BHH INPATIENT:  Family/Significant Other Suicide Prevention Education  Suicide Prevention Education:  Education Completed; Patria Mane (Mother) [phone] 11/16/2023 @ 4:29pm has been identified by the patient as the family member/significant other with whom the patient will be residing, and identified as the person(s) who will aid the patient in the event of a mental health crisis (suicidal ideations/suicide attempt).  With written consent from the patient, the family member/significant other has been provided the following suicide prevention education, prior to the and/or following the discharge of the patient.  The suicide prevention education provided includes the following: Suicide risk factors Suicide prevention and interventions National Suicide Hotline telephone number [facility] assessment telephone number [facility] Emergency Assistance 911 [facility] and/or Residential Mobile Crisis Unit telephone number  Request made of family/significant other to: Remove weapons (e.g., guns, rifles, knives), all items previously/currently identified as safety concern.   Remove drugs/medications (over-the-counter, prescriptions, illicit drugs), all items previously/currently identified as a safety concern.  The family member/significant other verbalizes understanding of the suicide prevention education information provided.  The family member/significant other agrees to remove the items of safety concern listed above.  Cherylin Waguespack O Tylyn Derwin 11/16/2023, 4:30 PM

## 2023-11-16 NOTE — Progress Notes (Signed)
Patient rated his depression level 2/10 and his anxiety level 1/10 with 10 being the highest and 0 none. Patient identified his goal for today as, "To get better not to have suicidal thoughts". Medication and group compliant. Minimal interaction observed with peers. Appetite good on shift. Safety maintained.  11/16/23 0815  Psych Admission Type (Psych Patients Only)  Admission Status Involuntary  Psychosocial Assessment  Patient Complaints None  Eye Contact Fair  Facial Expression Animated  Affect Appropriate to circumstance  Speech Logical/coherent  Interaction Assertive  Motor Activity Pacing  Appearance/Hygiene Unremarkable  Behavior Characteristics Cooperative;Calm  Mood Pleasant  Thought Process  Coherency WDL  Content WDL  Delusions None reported or observed  Perception WDL  Hallucination None reported or observed  Judgment WDL  Confusion WDL  Danger to Self  Current suicidal ideation? Denies  Agreement Not to Harm Self Yes  Description of Agreement Verbal  Danger to Others  Danger to Others None reported or observed

## 2023-11-16 NOTE — Group Note (Signed)
Date:  11/16/2023 Time:  8:57 PM  Group Topic/Focus:  Wrap-Up Group:   The focus of this group is to help patients review their daily goal of treatment and discuss progress on daily workbooks.    Participation Level:   Scot Dock 11/16/2023, 8:57 PM
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
## 2023-11-16 NOTE — Progress Notes (Signed)
   11/16/23 2200  Psych Admission Type (Psych Patients Only)  Admission Status Involuntary  Psychosocial Assessment  Patient Complaints None  Eye Contact Fair  Facial Expression Animated  Affect Appropriate to circumstance  Speech Logical/coherent  Interaction Assertive  Motor Activity Pacing  Appearance/Hygiene Unremarkable  Behavior Characteristics Cooperative;Calm  Mood Pleasant  Thought Process  Coherency WDL  Content WDL  Delusions None reported or observed  Perception WDL  Hallucination None reported or observed  Judgment WDL  Confusion WDL  Danger to Self  Current suicidal ideation? Denies  Agreement Not to Harm Self Yes  Description of Agreement verbal  Danger to Others  Danger to Others None reported or observed

## 2023-11-17 DIAGNOSIS — F250 Schizoaffective disorder, bipolar type: Secondary | ICD-10-CM | POA: Diagnosis not present
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
## 2023-11-17 NOTE — Plan of Care (Signed)
  Problem: Education: Goal: Emotional status will improve Outcome: Progressing   Problem: Education: Goal: Mental status will improve Outcome: Progressing   Problem: Activity: Goal: Interest or engagement in activities will improve Outcome: Progressing Goal: Sleeping patterns will improve Outcome: Progressing   Problem: Physical Regulation: Goal: Ability to maintain clinical measurements within normal limits will improve Outcome: Progressing   Problem: Safety: Goal: Periods of time without injury will increase Outcome: Progressing

## 2023-11-17 NOTE — [facility] Group Notes (Signed)
[facility] Group Notes:  (Nursing)  Date:  11/17/2023  Time:  1400  Type of Therapy:  Psychoeducational Skills  Participation Level:  Active  Participation Quality:  Appropriate  Affect:  Appropriate  Cognitive:  Alert and Appropriate  Insight:  Improving  Engagement in Group:  Engaged  Modes of Intervention:  Activity, Discussion, Exploration, Rapport Building, Socialization, and Support  Summary of Progress/Problems: Patients created their own Life Collage. Patient completed his collage, but chose to not share with the group  Shela Nevin 11/17/2023, 5:01 PM

## 2023-11-17 NOTE — Progress Notes (Signed)
Patient calm and cooperative this AM on shift, No distress noted or complaints made. Patient transferred to room 406 for continuation of care, report given to covering Nurse. Safety maintained.  11/17/23 0810  Psych Admission Type (Psych Patients Only)  Admission Status Involuntary  Psychosocial Assessment  Patient Complaints None  Eye Contact Fair  Facial Expression Animated  Affect Appropriate to circumstance  Speech Logical/coherent  Interaction Assertive  Motor Activity Pacing  Appearance/Hygiene Unremarkable  Behavior Characteristics Cooperative;Calm  Mood Pleasant  Thought Process  Coherency WDL  Content WDL  Delusions None reported or observed  Perception WDL  Hallucination None reported or observed  Judgment WDL  Confusion WDL  Danger to Self  Current suicidal ideation? Denies  Agreement Not to Harm Self Yes  Description of Agreement Verbal  Danger to Others  Danger to Others None reported or observed

## 2023-11-17 NOTE — [facility] Group Notes (Signed)
Type of Therapy and Topic:  Group Therapy: Mindfulness  Participation Level:  Did Not Attend   Description of Group:   In this group, patients shared and discussed the importance of acknowledging the elements in their lives for which they are Mindfulness and how this can positively impact their mood.  The group discussed how bringing the positive elements of their lives to the forefront of their minds can help with recovery from any illness, physical or mental.  An exercise was done as a group in which a list was made of mindfulness items to encourage participants to consider other potential positives in their lives.  Therapeutic Goals: Patients will identify one or more item for which they are grateful in each of 6 categories:  people, experiences, things, places, skills, and other. Patients will discuss how it is possible to seek out gratitude in even bad situations. Patients will explore other possible items of gratitude that they could remember.   Summary of Patient Progress:  NA Solution-Focused Therapy Activity

## 2023-11-17 NOTE — Plan of Care (Signed)
  Problem: Education: Goal: Knowledge of [facility] Education information/materials will improve Outcome: Progressing Goal: Emotional status will improve Outcome: Progressing Goal: Mental status will improve Outcome: Progressing Goal: Verbalization of understanding the information provided will improve Outcome: Progressing   

## 2023-11-17 NOTE — [facility] Group Notes (Signed)
Adult Psychoeducational Group Note  Date:  11/17/2023 Time:  10:03 AM  Group Topic/Focus:  Goals Group:   The focus of this group is to help patients establish daily goals to achieve during treatment and discuss how the patient can incorporate goal setting into their daily lives to aide in recovery. Orientation:   The focus of this group is to educate the patient on the purpose and policies of crisis stabilization and provide a format to answer questions about their admission.  The group details unit policies and expectations of patients while admitted.  Participation Level:  Active  Participation Quality:  Drowsy  Affect:  Appropriate  Cognitive:  Appropriate  Insight: Appropriate  Engagement in Group:  Engaged  Modes of Intervention:  Education  Additional Comments:  Pt attended the goals group and remained appropriate and engaged throughout the duration of the group.   Fara Olden O 11/17/2023, 10:03 AM

## 2023-11-17 NOTE — Group Note (Signed)
Date:  11/17/2023 Time:  11:57 PM  Group Topic/Focus:  Wrap-Up Group:   The focus of this group is to help patients review their daily goal of treatment and discuss progress on daily workbooks.    Participation Level:  Minimal  Participation Quality:  Appropriate  Affect:  Blunted and Flat  Cognitive:  Alert  Insight: Appropriate and Good  Engagement in Group:  Engaged  Modes of Intervention:  Discussion and Education  Additional Comments:  Pt attended and participated in wrap up group this evening and rated their day a 9/10. Pt states that they are feeling good about their move from 500 hall to 400 hall. Pt states that their goal is to continue to have a better mindset, in which he would need to learn knew coping skills to help him continue their positive mindset. Pt is anticipating D/C soon.   Chrisandra Netters 11/17/2023, 11:57 PM

## 2023-11-18 MED ORDER — HALOPERIDOL DECANOATE 100 MG/ML IM SOLN
150.0000 mg | INTRAMUSCULAR | Status: DC
  Administered 2023-11-19: 150 mg via INTRAMUSCULAR
  Filled 2023-11-18: qty 1.5
  Filled 2023-11-18: qty 2

## 2023-11-18 MED ORDER — BENZTROPINE MESYLATE 1 MG PO TABS
1.0000 mg | ORAL_TABLET | Freq: Two times a day (BID) | ORAL | Status: DC
  Filled 2023-11-18 (×2): qty 1

## 2023-11-18 MED ORDER — BENZTROPINE MESYLATE 1 MG PO TABS
1.0000 mg | ORAL_TABLET | Freq: Two times a day (BID) | ORAL | Status: DC
  Administered 2023-11-19: 1 mg via ORAL
  Filled 2023-11-18 (×3): qty 1

## 2023-11-18 NOTE — Group Note (Unsigned)
Date:  11/18/2023 Time:  8:59 PM  Group Topic/Focus:  Wrap-Up Group:   The focus of this group is to help patients review their daily goal of treatment and discuss progress on daily workbooks.     Participation Level:  {[facility] PARTICIPATION NUUVO:53664}  Participation Quality:  {B[facility] PARTICIPATION QUALITY:22265}  Affect:  {B[facility] AFFECT:22266}  Cognitive:  {[facility] COGNITIVE:22267}  Insight: {BHH Insight2:20797}  Engagement in Group:  {BHH ENGAGEMENT IN QIHKV:42595}  Modes of Intervention:  {B[facility] MODES OF INTERVENTION:22269}  Additional Comments:  ***  Kennieth Francois 11/18/2023, 8:59 PM

## 2023-11-18 NOTE — Group Note (Signed)
Recreation Therapy Group Note   Group Topic:Communication  Group Date: 11/18/2023 Start Time: 0940 End Time: 1000 Facilitators: Zoe Creasman-McCall, LRT,CTRS Location: [address] Dayroom   Group Topic:Communication  Group Date: 11/18/2023 Start Time: 0940 End Time: 1000 Facilitators: Marguerite Jarboe-McCall, LRT,CTRS Location: [address] Dayroom     Group Topic: Communication, Problem Solving   Goal Area(s) Addresses:  Patient will effectively listen to complete activity.  Patient will identify communication skills used to make activity successful.  Patient will identify how skills used during activity can be used to reach post d/c goals.    Intervention: Building surveyor Activity - Geometric pattern cards, pencils, blank paper    Group Description: Geometric Drawings.  Three volunteers from the peer group will be shown an abstract picture with a particular arrangement of geometrical shapes.  Each round, one 'speaker' will describe the pattern, as accurately as possible without revealing the image to the group.  The remaining group members will listen and draw the picture to reflect how it is described to them. Patients with the role of 'listener' cannot ask clarifying questions but, may request that the speaker repeat a direction. Once the drawings are complete, the presenter will show the rest of the group the picture and compare how close each person came to drawing the picture. LRT will facilitate a post-activity discussion regarding effective communication and the importance of planning, listening, and asking for clarification in daily interactions with others.   Education: Environmental consultant, Active listening, Support systems, Discharge planning   Education Outcome: Acknowledges understanding/In group clarification offered/Needs additional education.    Affect/Mood: N/A   Participation Level: Did not attend    Clinical Observations/Individualized Feedback:       Plan: Continue to engage patient in RT group sessions 2-3x/week.   Laurell Coalson-McCall, LRT,CTRS 11/18/2023 12:54 PM
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
## 2023-11-18 NOTE — Group Note (Signed)
Occupational Therapy Group Note  Group Topic:Coping Skills  Group Date: 11/18/2023 Start Time: 1430 End Time: 1500 Facilitators: Ted Mcalpine, OT   Group Description: Group encouraged increased engagement and participation through discussion and activity focused on "Coping Ahead." Patients were split up into teams and selected a card from a stack of positive coping strategies. Patients were instructed to act out/charade the coping skill for other peers to guess and receive points for their team. Discussion followed with a focus on identifying additional positive coping strategies and patients shared how they were going to cope ahead over the weekend while continuing hospitalization stay.  Therapeutic Goal(s): Identify positive vs negative coping strategies. Identify coping skills to be used during hospitalization vs coping skills outside of hospital/at home Increase participation in therapeutic group environment and promote engagement in treatment   Participation Level: Engaged   Participation Quality: Independent   Behavior: Appropriate   Speech/Thought Process: Relevant   Affect/Mood: Appropriate   Insight: Fair   Judgement: Fair      Modes of Intervention: Education  Patient Response to Interventions:  Attentive   Plan: Continue to engage patient in OT groups 2 - 3x/week.  11/18/2023  Ted Mcalpine, OT  Kerrin Champagne, OT

## 2023-11-18 NOTE — Progress Notes (Signed)
   11/18/23 1200  Psych Admission Type (Psych Patients Only)  Admission Status Involuntary  Psychosocial Assessment  Patient Complaints None  Eye Contact Fair  Facial Expression Animated  Affect Appropriate to circumstance  Speech Logical/coherent  Interaction Assertive  Motor Activity Pacing  Appearance/Hygiene Unremarkable  Behavior Characteristics Cooperative;Calm  Mood Pleasant  Thought Process  Coherency WDL  Content WDL  Delusions None reported or observed  Perception WDL  Hallucination None reported or observed  Judgment WDL  Confusion WDL  Danger to Self  Current suicidal ideation? Denies  Self-Injurious Behavior No self-injurious ideation or behavior indicators observed or expressed   Agreement Not to Harm Self Yes  Description of Agreement verbal  Danger to Others  Danger to Others None reported or observed

## 2023-11-18 NOTE — Plan of Care (Signed)

## 2023-11-19 MED ORDER — BENZTROPINE MESYLATE 1 MG PO TABS
1.0000 mg | ORAL_TABLET | Freq: Three times a day (TID) | ORAL | Status: DC
  Administered 2023-11-19 – 2023-11-21 (×6): 1 mg via ORAL
  Filled 2023-11-19 (×9): qty 1

## 2023-11-19 NOTE — Progress Notes (Signed)
Family meeting scheduled with parents at 1:00 pm on 11/22/2023. Pt to  be discharged afterwards. Pt/family made aware.

## 2023-11-19 NOTE — Group Note (Unsigned)
Date:  11/20/2023 Time:  2:05 AM  Group Topic/Focus:  Wrap-Up Group:   The focus of this group is to help patients review their daily goal of treatment and discuss progress on daily workbooks.    Participation Level:  Active  Participation Quality:  Appropriate and Sharing  Affect:  Appropriate  Cognitive:  Appropriate  Insight: Appropriate  Engagement in Group:  Engaged  Modes of Intervention:  Discussion and Socialization  Additional Comments:  Patient used "Wrap-Up Group" sheets and shared one thing from sheet with group.  Patient rated his day a 9/10. Patient stated that his goal for today was to "to work on discharge, and getting used to speaking with other patients" and patient stated that he achieved that goal/"made progress". Patient stated that coping skill(s) that he has found to be most helpful is "refection and talking to other patients".   Mario Thomas 11/20/2023, 2:05 AM

## 2023-11-19 NOTE — Group Note (Signed)
Date:  11/19/2023 Time:  9:53 AM  Group Topic/Focus:  Goals Group:   The focus of this group is to help patients establish daily goals to achieve during treatment and discuss how the patient can incorporate goal setting into their daily lives to aide in recovery. Orientation:   The focus of this group is to educate the patient on the purpose and policies of crisis stabilization and provide a format to answer questions about their admission.  The group details unit policies and expectations of patients while admitted.    Participation Level:  Active  Participation Quality:  Appropriate  Affect:  Appropriate  Cognitive:  Appropriate  Insight: Appropriate  Engagement in Group:  Engaged  Modes of Intervention:  Discussion  Additional Comments:    Jameriah Trotti D Jamar Weatherall 11/19/2023, 9:53 AM

## 2023-11-19 NOTE — Group Note (Signed)
Recreation Therapy Group Note   Group Topic:Animal Assisted Therapy   Group Date: 11/19/2023 Start Time: 0950 End Time: 1030 Facilitators: Jshaun Abernathy-McCall, LRT,CTRS Location: [address] Dayroom   Animal-Assisted Activity (AAA) Program Checklist/Progress Notes Patient Eligibility Criteria Checklist & Daily Group note for Rec Tx Intervention  AAA/T Program Assumption of Risk Form signed by Patient/ or Parent Legal Guardian Yes  Patient is free of allergies or severe asthma Yes  Patient reports no fear of animals Yes  Patient reports no history of cruelty to animals Yes  Patient understands his/her participation is voluntary Yes  Patient washes hands before animal contact Yes  Patient washes hands after animal contact Yes  Education: Hand Washing, Appropriate Animal Interaction   Education Outcome: Acknowledges education.    Affect/Mood: Appropriate   Participation Level: Engaged   Participation Quality: Independent   Behavior: Appropriate   Speech/Thought Process: Focused   Insight: Good   Judgement: Good   Modes of Intervention: Teaching laboratory technician   Patient Response to Interventions:  Engaged   Education Outcome:  In group clarification offered    Clinical Observations/Individualized Feedback: Patient attended session and interacted appropriately with therapy dog and peers. Patient asked appropriate questions about therapy dog and his training. Patient shared stories about their pets at home with group.    Plan: Continue to engage patient in RT group sessions 2-3x/week.   Mario Thomas, LRT,CTRS 11/19/2023 1:12 PM

## 2023-11-19 NOTE — Progress Notes (Addendum)
D. Pt has been calm and cooperative on the unit- observed in the milieu attending groups. Per pt's self inventory, pt rated his depression,hopelessness and anxiety all 1/10. Pt reported sleeping well last night, described his appetite as 'good', energy level as 'normal', and concentration as 'good'.  Pt currently denies SI/HI and AVH .  A. Labs and vitals monitored. Pt given and educated on medications. Pt received LAI today.  Pt supported emotionally and encouraged to express concerns and ask questions.   R. Pt remains safe with 15 minute checks. Will continue POC.    11/19/23 1000  Psych Admission Type (Psych Patients Only)  Admission Status Involuntary  Psychosocial Assessment  Patient Complaints None  Eye Contact Fair  Facial Expression Animated  Affect Appropriate to circumstance  Speech Logical/coherent  Interaction Assertive  Motor Activity Pacing  Appearance/Hygiene Unremarkable  Behavior Characteristics Cooperative;Calm  Mood Pleasant  Thought Process  Coherency WDL  Content WDL  Delusions None reported or observed  Perception WDL  Hallucination None reported or observed  Judgment WDL  Confusion WDL  Danger to Self  Current suicidal ideation? Denies  Danger to Others  Danger to Others None reported or observed

## 2023-11-19 NOTE — Plan of Care (Signed)
°  Problem: Education: °Goal: Emotional status will improve °Outcome: Progressing °Goal: Mental status will improve °Outcome: Progressing °  °Problem: Activity: °Goal: Interest or engagement in activities will improve °Outcome: Progressing °  °

## 2023-11-19 NOTE — Progress Notes (Signed)
   11/18/23 2130  Psych Admission Type (Psych Patients Only)  Admission Status Involuntary  Psychosocial Assessment  Patient Complaints None  Eye Contact Fair  Facial Expression Animated  Affect Appropriate to circumstance  Speech Logical/coherent  Interaction Assertive  Motor Activity Pacing  Appearance/Hygiene Unremarkable  Behavior Characteristics Cooperative;Calm  Mood Pleasant  Thought Process  Coherency WDL  Content WDL  Delusions None reported or observed  Perception WDL  Hallucination None reported or observed  Judgment WDL  Confusion WDL  Danger to Self  Current suicidal ideation? Denies  Self-Injurious Behavior No self-injurious ideation or behavior indicators observed or expressed   Agreement Not to Harm Self Yes  Description of Agreement Verbal  Danger to Others  Danger to Others None reported or observed
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
## 2023-11-19 NOTE — Plan of Care (Signed)
  Problem: Education: Goal: Emotional status will improve Outcome: Progressing Goal: Mental status will improve Outcome: Progressing Goal: Verbalization of understanding the information provided will improve Outcome: Progressing   Problem: Activity: Goal: Interest or engagement in activities will improve Outcome: Progressing Goal: Sleeping patterns will improve Outcome: Progressing   Problem: Coping: Goal: Ability to verbalize frustrations and anger appropriately will improve Outcome: Progressing Goal: Ability to demonstrate self-control will improve Outcome: Progressing   Problem: Safety: Goal: Periods of time without injury will increase Outcome: Progressing

## 2023-11-20 ENCOUNTER — Encounter (HOSPITAL_COMMUNITY): Payer: Self-pay

## 2023-11-20 NOTE — Progress Notes (Signed)
   11/19/23 2200  Psych Admission Type (Psych Patients Only)  Admission Status Involuntary  Psychosocial Assessment  Patient Complaints None  Eye Contact Fair  Facial Expression Pensive  Affect Appropriate to circumstance  Speech Logical/coherent  Interaction Assertive  Motor Activity Restless  Appearance/Hygiene Unremarkable  Behavior Characteristics Cooperative;Calm  Mood Preoccupied  Thought Process  Coherency WDL  Content WDL  Delusions None reported or observed  Perception WDL  Hallucination None reported or observed  Judgment WDL  Confusion WDL  Danger to Self  Current suicidal ideation? Denies  Self-Injurious Behavior No self-injurious ideation or behavior indicators observed or expressed   Agreement Not to Harm Self Yes  Description of Agreement Verbal  Danger to Others  Danger to Others None reported or observed

## 2023-11-20 NOTE — Group Note (Signed)
Date:  11/21/2023 Time:  4:43 AM  Group Topic/Focus:  Wrap-Up Group:   The focus of this group is to help patients review their daily goal of treatment and discuss progress on daily workbooks.    Participation Level:  Active  Participation Quality:  Appropriate and Sharing  Affect:  Appropriate  Cognitive:  Appropriate  Insight: Appropriate  Engagement in Group:  Engaged  Modes of Intervention:  Activity and Socialization  Additional Comments:  Patient stated that he had a "good day". Patient shared that he had a visit with his day and stated that went well. Patient stated that he has has several "meaningful" interactions and made connections with people on the unit. Patient stated that is goal is to "prepare for discharge tomorrow and not get overwhelmed". Patient rated his day a 9/10. Patient participated in group activity after sharing.   Mario Thomas 11/21/2023, 4:43 AM

## 2023-11-20 NOTE — Group Note (Signed)
Date:  11/20/2023 Time:  10:48 AM  Group Topic/Focus:  Goals Group:   The focus of this group is to help patients establish daily goals to achieve during treatment and discuss how the patient can incorporate goal setting into their daily lives to aide in recovery. Orientation:   The focus of this group is to educate the patient on the purpose and policies of crisis stabilization and provide a format to answer questions about their admission.  The group details unit policies and expectations of patients while admitted.    Participation Level:  Did Not Attend  Participation Quality:   n/a  Affect:   n/a  Cognitive:   n/a  Insight: None  Engagement in Group:   n/a  Modes of Intervention:   n/a  Additional Comments:   Pt did not attend.  Mario Thomas 11/20/2023, 10:48 AM

## 2023-11-20 NOTE — Plan of Care (Signed)
  Problem: Health Behavior/Discharge Planning: Goal: Compliance with treatment plan for underlying cause of condition will improve Outcome: Progressing   Problem: Safety: Goal: Periods of time without injury will increase Outcome: Progressing   

## 2023-11-20 NOTE — [facility] Group Notes (Signed)
Spiritual care group on grief and loss facilitated by Chaplain Dyanne Carrel, Bcc  Group Goal: Support / Education around grief and loss  Members engage in facilitated group support and psycho-social education.  Group Description:  Following introductions and group rules, group members engaged in facilitated group dialogue and support around topic of loss, with particular support around experiences of loss in their lives. Group Identified types of loss (relationships / self / things) and identified patterns, circumstances, and changes that precipitate losses. Reflected on thoughts / feelings around loss, normalized grief responses, and recognized variety in grief experience. Group encouraged individual reflection on safe space and on the coping skills that they are already utilizing.  Group drew on Adlerian / Rogerian and narrative framework  Patient Progress: Mario Thomas attended group and actively engaged and participated in group conversation and activities.

## 2023-11-20 NOTE — [facility] Group Notes (Signed)
Spirituality group facilitated by Kathleen Argue, BCC.  Group Description: Group focused on topic of hope. Patients participated in facilitated discussion around topic, connecting with one another around experiences and definitions for hope. Group members engaged with visual explorer photos, reflecting on what hope looks like for them today. Group engaged in discussion around how their definitions of hope are present today in hospital.  Modalities: Psycho-social ed, Adlerian, Narrative, MI  Patient Progress: Mario Thomas attended group and actively engaged and participated in group conversation and activities.  Comments demonstrated good insight and contributed positively to the group conversation.

## 2023-11-20 NOTE — Group Note (Signed)
Recreation Therapy Group Note   Group Topic:Team Building  Group Date: 11/20/2023 Start Time: 0930 End Time: 1000 Facilitators: Edgar Reisz-McCall, LRT,CTRS Location: [address] Dayroom   Group Topic: Communication, Team Building, Problem Solving  Goal Area(s) Addresses:  Patient will effectively work with peer towards shared goal.  Patient will identify skills used to make activity successful.  Patient will identify how skills used during activity can be used to reach post d/c goals.   Intervention: STEM Activity  Group Description: Stage manager. In teams of 3-5, patients were given 12 plastic drinking straws and an equal length of masking tape. Using the materials provided, patients were asked to build a landing pad to catch a golf ball dropped from approximately 5 feet in the air. All materials were required to be used by the team in their design. LRT facilitated post-activity discussion.  Education: Pharmacist, community, Scientist, physiological, Discharge Planning   Education Outcome: Acknowledges education/In group clarification offered/Needs additional education.    Affect/Mood: N/A   Participation Level: Did not attend    Clinical Observations/Individualized Feedback:     Plan: Continue to engage patient in RT group sessions 2-3x/week.   Georgiann Neider-McCall, LRT,CTRS 11/20/2023 1:55 PM

## 2023-11-20 NOTE — Plan of Care (Signed)
  Problem: Education: Goal: Emotional status will improve Outcome: Progressing Goal: Mental status will improve Outcome: Progressing Goal: Verbalization of understanding the information provided will improve Outcome: Progressing   Problem: Activity: Goal: Interest or engagement in activities will improve Outcome: Progressing Goal: Sleeping patterns will improve Outcome: Progressing   Problem: Coping: Goal: Ability to verbalize frustrations and anger appropriately will improve Outcome: Progressing Goal: Ability to demonstrate self-control will improve Outcome: Progressing   Problem: Safety: Goal: Periods of time without injury will increase Outcome: Progressing

## 2023-11-20 NOTE — Progress Notes (Addendum)
Patient remains cooperative and med compliant in unit. Attended some groups. No behavioral issues throughout day.   11/20/23 0815  Psych Admission Type (Psych Patients Only)  Admission Status Involuntary  Psychosocial Assessment  Patient Complaints None  Eye Contact Fair  Facial Expression Flat;Pensive  Affect Flat  Speech Logical/coherent  Interaction Assertive  Motor Activity Slow  Appearance/Hygiene Unremarkable  Behavior Characteristics Cooperative  Mood Preoccupied  Thought Process  Coherency WDL  Content WDL  Delusions None reported or observed  Perception WDL  Hallucination None reported or observed  Judgment WDL  Confusion None  Danger to Self  Current suicidal ideation? Denies  Self-Injurious Behavior No self-injurious ideation or behavior indicators observed or expressed   Agreement Not to Harm Self Yes  Description of Agreement verbal  Danger to Others  Danger to Others None reported or observed

## 2023-11-20 NOTE — BH IP Treatment Plan (Signed)
Interdisciplinary Treatment and Diagnostic Plan Update  11/20/2023 Time of Session: 11:25AM- Mario Thomas MRN: [id number]  Principal Diagnosis: Schizoaffective disorder (HCC)  Secondary Diagnoses: Principal Problem:   Schizoaffective disorder (HCC)   Current Medications:  Current Facility-Administered Medications  Medication Dose Route Frequency Provider Last Rate Last Admin   acetaminophen (TYLENOL) tablet 650 mg  650 mg Oral Q6H PRN Jearld Lesch, NP       alum & mag hydroxide-simeth (MAALOX/MYLANTA) 200-200-20 MG/5ML suspension 30 mL  30 mL Oral Q4H PRN Jearld Lesch, NP   30 mL at 11/20/23 0816   benztropine (COGENTIN) tablet 1 mg  1 mg Oral Q8H Peterson Ao, MD   1 mg at 11/20/23 4098   haloperidol (HALDOL) tablet 5 mg  5 mg Oral TID PRN Jearld Lesch, NP       And   diphenhydrAMINE (BENADRYL) capsule 50 mg  50 mg Oral TID PRN Jearld Lesch, NP   50 mg at 11/11/23 0132   haloperidol lactate (HALDOL) injection 5 mg  5 mg Intramuscular TID PRN Jearld Lesch, NP       And   diphenhydrAMINE (BENADRYL) injection 50 mg  50 mg Intramuscular TID PRN Jearld Lesch, NP       And   LORazepam (ATIVAN) injection 2 mg  2 mg Intramuscular TID PRN Jearld Lesch, NP       haloperidol lactate (HALDOL) injection 10 mg  10 mg Intramuscular TID PRN Jearld Lesch, NP       And   diphenhydrAMINE (BENADRYL) injection 50 mg  50 mg Intramuscular TID PRN Jearld Lesch, NP       And   LORazepam (ATIVAN) injection 2 mg  2 mg Intramuscular TID PRN Jearld Lesch, NP       divalproex (DEPAKOTE) DR tablet 500 mg  500 mg Oral Q12H Peterson Ao, MD   500 mg at 11/20/23 0815   haloperidol (HALDOL) tablet 5 mg  5 mg Oral QHS Paliy, Serina Cowper, MD   5 mg at 11/19/23 2155   haloperidol decanoate (HALDOL DECANOATE) 100 MG/ML injection 150 mg  150 mg Intramuscular Q30 days Peterson Ao, MD   150 mg at 11/19/23 1191   hydrOXYzine (ATARAX) tablet 25 mg  25 mg Oral TID PRN Jearld Lesch, NP   25 mg at 11/19/23 2159   magnesium hydroxide (MILK OF MAGNESIA) suspension 30 mL  30 mL Oral Daily PRN Jearld Lesch, NP       melatonin tablet 3 mg  3 mg Oral QHS Peterson Ao, MD   3 mg at 11/19/23 2154   traZODone (DESYREL) tablet 50 mg  50 mg Oral QHS PRN Jearld Lesch, NP   50 mg at 11/19/23 2159   PTA Medications: Medications Prior to Admission  Medication Sig Dispense Refill Last Dose   benztropine (COGENTIN) 2 MG tablet Take 1 tablet (2 mg total) by mouth daily. 30 tablet 0    divalproex (DEPAKOTE) 250 MG DR tablet Take 1 tablet (250 mg total) by mouth every 12 (twelve) hours. 30 tablet 0    haloperidol (HALDOL) 2 MG tablet Take 1 tablet (2 mg total) by mouth 2 (two) times daily for 15 days. 30 tablet 0    traZODone (DESYREL) 50 MG tablet Take 1 tablet (50 mg total) by mouth at bedtime. 30 tablet 0     Patient Stressors: Health problems   Medication change or noncompliance    Patient  Strengths: Ability for insight  Active sense of humor  Physical Health  Supportive family/friends   Treatment Modalities: Medication Management, Group therapy, Case management,  1 to 1 session with clinician, Psychoeducation, Recreational therapy.   Physician Treatment Plan for Primary Diagnosis: Schizoaffective disorder (HCC) Long Term Goal(s):     Short Term Goals: Ability to identify changes in lifestyle to reduce recurrence of condition will improve Ability to verbalize feelings will improve Ability to disclose and discuss suicidal ideas Ability to demonstrate self-control will improve Ability to identify and develop effective coping behaviors will improve Ability to maintain clinical measurements within normal limits will improve Compliance with prescribed medications will improve Ability to identify triggers associated with substance abuse/mental health issues will improve  Medication Management: Evaluate patient's response, side effects, and tolerance of  medication regimen.  Therapeutic Interventions: 1 to 1 sessions, Unit Group sessions and Medication administration.  Evaluation of Outcomes: Progressing  Physician Treatment Plan for Secondary Diagnosis: Principal Problem:   Schizoaffective disorder (HCC)  Long Term Goal(s):     Short Term Goals: Ability to identify changes in lifestyle to reduce recurrence of condition will improve Ability to verbalize feelings will improve Ability to disclose and discuss suicidal ideas Ability to demonstrate self-control will improve Ability to identify and develop effective coping behaviors will improve Ability to maintain clinical measurements within normal limits will improve Compliance with prescribed medications will improve Ability to identify triggers associated with substance abuse/mental health issues will improve     Medication Management: Evaluate patient's response, side effects, and tolerance of medication regimen.  Therapeutic Interventions: 1 to 1 sessions, Unit Group sessions and Medication administration.  Evaluation of Outcomes: Progressing   RN Treatment Plan for Primary Diagnosis: Schizoaffective disorder (HCC) Long Term Goal(s): Knowledge of disease and therapeutic regimen to maintain health will improve  Short Term Goals: Ability to remain free from injury will improve, Ability to verbalize frustration and anger appropriately will improve, Ability to participate in decision making will improve, Ability to verbalize feelings will improve, Ability to identify and develop effective coping behaviors will improve, and Compliance with prescribed medications will improve  Medication Management: RN will administer medications as ordered by provider, will assess and evaluate patient's response and provide education to patient for prescribed medication. RN will report any adverse and/or side effects to prescribing provider.  Therapeutic Interventions: 1 on 1 counseling sessions,  Psychoeducation, Medication administration, Evaluate responses to treatment, Monitor vital signs and CBGs as ordered, Perform/monitor CIWA, COWS, AIMS and Fall Risk screenings as ordered, Perform wound care treatments as ordered.  Evaluation of Outcomes: Progressing   LCSW Treatment Plan for Primary Diagnosis: Schizoaffective disorder (HCC) Long Term Goal(s): Safe transition to appropriate next level of care at discharge, Engage patient in therapeutic group addressing interpersonal concerns.  Short Term Goals: Engage patient in aftercare planning with referrals and resources, Increase social support, Facilitate acceptance of mental health diagnosis and concerns, Identify triggers associated with mental health/substance abuse issues, and Increase skills for wellness and recovery  Therapeutic Interventions: Assess for all discharge needs, 1 to 1 time with Social worker, Explore available resources and support systems, Assess for adequacy in community support network, Educate family and significant other(s) on suicide prevention, Complete Psychosocial Assessment, Interpersonal group therapy.  Evaluation of Outcomes: Progressing   Progress in Treatment: Attending groups: Yes. Participating in groups: Yes. Taking medication as prescribed: Yes. Toleration medication: Yes. Family/Significant other contact made: Yes, contacted:  Tamala Ser (Father) [phone]  Patient understands diagnosis: No. Discussing patient identified  problems/goals with staff: Yes. Medical problems stabilized or resolved: Yes. Denies suicidal/homicidal ideation: Yes. Issues/concerns per patient self-inventory: No.     New problem(s) identified: No, Describe:  NONE   New Short Term/Long Term Goal(s): Medication stabilization, elimination of SI thoughts, development of comprehensive mental wellness plan.      Patient Goals:  "Lower my anxiety, pray more and communicate better"   Discharge Plan or Barriers: Patient  recently admitted. CSW will continue to follow and assess for appropriate referrals and possible discharge planning.      Reason for Continuation of Hospitalization: Homicidal ideation Medication stabilization Suicidal ideation   Estimated Length of Stay: 1-2 days  Last 3 [place] Suicide Severity Risk Score: Flowsheet Row Ad[facility] (Current[facility] Most recent reading at 11/10/2023 12:20 AM ED from 11/08/2023 in [facility] Most recent reading at 11/08/2023  5:41 PM ED from 11/08/2023 in [facility] Emergency Department at [facility] Most recent reading at 11/08/2023  9:00 AM  C-SSRS RISK CATEGORY High Risk High Risk No Risk       Last PHQ 2/9 Scores:    11/14/2022    6:55 PM 11/27/2019    9:41 AM 08/04/2018    9:03 AM  Depression screen PHQ 2/9  Decreased Interest -- 0 0  Down, Depressed, Hopeless -- 0 0  PHQ - 2 Score  0 0    Scribe for Treatment Team: Esmeralda Arthur 11/20/2023 1:43 PM
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
## 2023-11-21 MED ORDER — MELATONIN 3 MG PO TABS: 3.0000 mg | ORAL_TABLET | Freq: Every day | ORAL | Status: DC

## 2023-11-21 MED ORDER — HYDROXYZINE HCL 25 MG PO TABS: 25.0000 mg | ORAL_TABLET | Freq: Three times a day (TID) | ORAL | 0 refills | Status: DC | PRN

## 2023-11-21 MED ORDER — BENZTROPINE MESYLATE 2 MG PO TABS: 2.0000 mg | ORAL_TABLET | Freq: Every day | ORAL | 0 refills | Status: AC

## 2023-11-21 MED ORDER — HALOPERIDOL DECANOATE 100 MG/ML IM SOLN: 150.0000 mg | INTRAMUSCULAR | 0 refills | Status: DC

## 2023-11-21 MED ORDER — TRAZODONE HCL 50 MG PO TABS: 50.0000 mg | ORAL_TABLET | Freq: Every evening | ORAL | 0 refills | Status: DC | PRN

## 2023-11-21 MED ORDER — BENZTROPINE MESYLATE 1 MG PO TABS
2.0000 mg | ORAL_TABLET | Freq: Every day | ORAL | Status: DC
  Filled 2023-11-21 (×2): qty 2

## 2023-11-21 MED ORDER — DIVALPROEX SODIUM 500 MG PO DR TAB: 500.0000 mg | DELAYED_RELEASE_TABLET | Freq: Two times a day (BID) | ORAL | 0 refills | Status: DC

## 2023-11-21 NOTE — Progress Notes (Signed)
   11/21/23 0910  Psych Admission Type (Psych Patients Only)  Admission Status Involuntary  Psychosocial Assessment  Patient Complaints None  Eye Contact Fair  Facial Expression Flat;Pensive  Affect Flat  Speech Logical/coherent  Interaction Assertive  Motor Activity Slow  Appearance/Hygiene Unremarkable  Behavior Characteristics Cooperative;Appropriate to situation  Mood Preoccupied  Thought Process  Coherency WDL  Content WDL  Delusions None reported or observed  Perception WDL  Hallucination None reported or observed  Judgment WDL  Confusion None  Danger to Self  Current suicidal ideation? Denies  Self-Injurious Behavior No self-injurious ideation or behavior indicators observed or expressed   Agreement Not to Harm Self Yes  Description of Agreement Verbal  Danger to Others  Danger to Others None reported or observed

## 2023-11-21 NOTE — Plan of Care (Signed)
  Problem: Activity: Goal: Interest or engagement in activities will improve Outcome: Progressing Goal: Sleeping patterns will improve Outcome: Progressing   Problem: Coping: Goal: Ability to verbalize frustrations and anger appropriately will improve Outcome: Progressing Goal: Ability to demonstrate self-control will improve Outcome: Progressing   Problem: Health Behavior/Discharge Planning: Goal: Identification of resources available to assist in meeting health care needs will improve Outcome: Progressing Goal: Compliance with treatment plan for underlying cause of condition will improve Outcome: Progressing   Problem: Physical Regulation: Goal: Ability to maintain clinical measurements within normal limits will improve Outcome: Progressing   Problem: Safety: Goal: Periods of time without injury will increase Outcome: Progressing   

## 2023-11-21 NOTE — Progress Notes (Signed)
Patient discharged to home accompanied by family members. Discharge instructions, prescriptions, all required discharge documents and information about follow-up appointment given to pt with verbalization of understanding. All personal belongings returned to pt at time of discharge. Plan of Care resolved.  Pt denied SI/HI and AVH. Pt escorted to lobby by RN at 1430.
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
## 2023-11-21 NOTE — Progress Notes (Addendum)
  [facility] Adult Case Management Discharge Plan :  Will you be returning to the same living situation after discharge:  Yes,  pt will be returning home with family At discharge, do you have transportation home?: Yes,  pt will be leaving with family after meeting this afternoon at 1:00PM Do you have the ability to pay for your medications: Yes,  pt has BCBS  Release of information consent forms completed and in the chart;  Patient's signature needed at discharge.  Patient to Follow up at:  Follow-up Information     [facility]. Call.   Why: You have an appt with ACTT services on 11/27/2023 at 11:00 am to establish medication management and therapy services, your team will pick you up from your home and transport you to your appt. Contact information: [address] [place] [address] [phone]         [facility] Follow up.   Why: This is another ACTT team with their contact information. Contact information: [phone]  [facility], [address], [place], [place] [address]        [facility] ACT[facility] Follow up.   Why: This is another ACTT team with their contact information. Contact information: [facility], [address], [place], [place] [address]                Next level of care provider has access to [facility] Link:no  Safety Planning and Suicide Prevention discussed: Yes,  Tamala Ser (Father) [phone] and Patria Mane (Mother) [phone] 11/16/2023 @ 4:29pm     Has patient been referred to the Quitline?: Patient does not use tobacco/nicotine products  Patient has been referred for addiction treatment: No known substance use disorder.  Kathi Der, LCSWA 11/21/2023, 2:13 PM

## 2023-11-21 NOTE — [facility] Suicide Risk Assessment (Signed)
[facility] Discharge Suicide Risk Assessment  Principal Problem: Schizoaffective disorder [facility]) Discharge Diagnoses: Principal Problem:   Schizoaffective disorder (HCC)  Reason for Admission: Involuntarily committed due to aggressive and disorganized behavior at home in settings of worsened psychosis due to non-compliance with oral medication.   Hospital Summary  During the patient's hospitalization, patient had extensive initial psychiatric evaluation, and follow-up psychiatric evaluations every day.  Psychiatric diagnoses provided upon initial assessment:  Schizoaffective, Bipolar Type, Mixed Episode   Patient's psychiatric medications were adjusted on admission:  - Started Haldol 5 mg at bedtime  - Started Depakote 250 mg twice daily  - Started Cogentin 2 mg daily   During the hospitalization, other adjustments were made to the patient's psychiatric medication regimen:   - Discontinued Hadol 5 mg at discharge  - Received Haldol Decanoate 150 mg IM on 12/10, will need repeat dose on 12/17/2023, outpatient team can monitor for efficacy and tolerability - Increased Depakote 500 mg twice daily   - Adjusted Cogentin to 1 mg BID >> 1 mg TID, discharged with 2 mg daily, outpatient psychiatrist can gradually taper off overtime   Patient's care was discussed during the interdisciplinary team meeting every day during the hospitalization.  The patient denies having side effects to prescribed psychiatric medication.  Gradually, patient started adjusting to milieu. The patient was evaluated each day by a clinical provider to ascertain response to treatment. Improvement was noted by the patient's report of decreasing symptoms, improved sleep and appetite, affect, medication tolerance, behavior, and participation in unit programming.  Patient was asked each day to complete a self inventory noting mood, mental status, pain, new symptoms, anxiety and concerns.    Symptoms were reported as significantly  decreased or resolved completely by discharge.   On day of discharge, the patient reports that their mood is stable. The patient denied having suicidal thoughts for more than 48 hours prior to discharge.  Patient denies having homicidal thoughts.  Patient denies having auditory hallucinations.  Patient denies any visual hallucinations or other symptoms of psychosis. The patient was motivated to continue taking medication with a goal of continued improvement in mental health. A family meeting was had prior to discharge, to address any questions presented at that time. Attendees felt safe with home discharge after family meeting.   The patient reports their target psychiatric symptoms of auditory, visual hallucinations and manic symptoms responded well to the psychiatric medications, and the patient reports overall benefit other psychiatric hospitalization. Supportive psychotherapy was provided to the patient. The patient also participated in regular group therapy while hospitalized. Coping skills, problem solving as well as relaxation therapies were also part of the unit programming.  Labs were reviewed with the patient, and abnormal results were discussed with the patient.  The patient is able to verbalize their individual safety plan to this provider.  # It is recommended to the patient to continue psychiatric medications as prescribed, after discharge from the hospital.    # It is recommended to the patient to follow up with your outpatient psychiatric provider and PCP.  # It was discussed with the patient, the impact of alcohol, drugs, tobacco have been there overall psychiatric and medical wellbeing, and total abstinence from substance use was recommended the patient.ed.  # Prescriptions provided or sent directly to preferred pharmacy at discharge. Patient agreeable to plan. Given opportunity to ask questions. Appears to feel comfortable with discharge.    # In the event of worsening symptoms,  the patient is instructed to call  the crisis hotline, 911 and or go to the nearest ED for appropriate evaluation and treatment of symptoms. To follow-up with primary care provider for other medical issues, concerns and or health care needs  # Patient was discharged home with a plan to follow up as noted below.  Total Time spent with patient: 30 minutes  Musculoskeletal: Strength & Muscle Tone: within normal limits Gait & Station: normal Patient leans: N/A  Psychiatric Specialty Exam  Presentation  General Appearance: Appropriate for Environment; Casual  Eye Contact:Good  Speech:Clear and Coherent; Normal Rate  Speech Volume:Normal  Handedness:Left  Mood and Affect  Mood:Euthymic  Duration of Depression Symptoms: Greater than two weeks  Affect:Appropriate   Thought Process  Thought Processes:Coherent; Goal Directed  Descriptions of Associations:Intact  Orientation:Full (Time, Place and Person)  Thought Content:Logical  History of Schizophrenia/Schizoaffective disorder:Yes  Duration of Psychotic Symptoms:Greater than six months  Hallucinations:Hallucinations: None  Ideas of Reference:None  Suicidal Thoughts:Suicidal Thoughts: No SI Passive Intent and/or Plan: Without Intent  Homicidal Thoughts:Homicidal Thoughts: No   Sensorium  Memory:Immediate Good  Judgment:Intact  Insight:Good   Executive Functions  Concentration:Good  Attention Span:Good  Recall:Good  Fund of Knowledge:Good  Language:Good   Psychomotor Activity  Psychomotor Activity:Psychomotor Activity: Normal   Assets  Assets:Communication Skills; Desire for Improvement; Housing; Social Support   Sleep  Sleep:Sleep: Good Number of Hours of Sleep: 7.5   Physical Exam: Physical Exam Constitutional:      Appearance: Normal appearance. He is not ill-appearing or toxic-appearing.  Pulmonary:     Effort: Pulmonary effort is normal.  Musculoskeletal:        General: Normal  range of motion.  Neurological:     Mental Status: He is alert. Mental status is at baseline.  Psychiatric:        Attention and Perception: Attention and perception normal. He does not perceive auditory or visual hallucinations.        Mood and Affect: Mood normal. Mood is not anxious or depressed. Affect is not blunt.        Speech: Speech normal.        Behavior: Behavior normal. Behavior is not agitated, slowed, aggressive or withdrawn.        Thought Content: Thought content is not paranoid. Thought content does not include homicidal or suicidal ideation. Thought content does not include homicidal or suicidal plan.        Judgment: Judgment is not impulsive or inappropriate.    Review of Systems  Constitutional:  Negative for chills and fever.  Respiratory:  Negative for cough.   Cardiovascular:  Negative for chest pain.  Gastrointestinal:  Negative for constipation, diarrhea, nausea and vomiting.  Neurological:  Negative for tremors and weakness.  Psychiatric/Behavioral:  Negative for depression, hallucinations and suicidal ideas. The patient is not nervous/anxious.    Blood pressure 116/62, pulse 80, temperature 98.6 F (37 C), temperature source Oral, resp. rate 20, height 5\' 11"  (1.803 m), weight 88.5 kg, SpO2 100%. Body mass index is 27.2 kg/m.  Mental Status Per Nursing Assessment::   On Admission:  Suicidal ideation indicated by patient, Suicide plan, Thoughts of violence towards others, Belief that plan would result in death  Demographic Factors:  Male, Adolescent or young adult, and Unemployed  Loss Factors: Decrease in vocational status  Historical Factors: Prior suicide attempts, Family history of suicide, Family history of mental illness or substance abuse, and Impulsivity  Risk Reduction Factors:   Sense of responsibility to family, Religious beliefs about death, Living with  another person, especially a relative, and Positive social support  Continued Clinical  Symptoms:  Schizoaffective, bipolar type, mixed episode ( resolved)  Unstable or Poor Therapeutic Relationship Previous Psychiatric Diagnoses and Treatments  Cognitive Features That Contribute To Risk:  None    Suicide Risk:  Mild: There are no identifiable suicide plans, no associated intent, mild dysphoria and related symptoms, good self-control (both objective and subjective assessment), few other risk factors, and identifiable protective factors, including available and accessible social support.   Follow-up Information     [facility]. Call.   Why: You have an appt with ACTT services on 11/27/2023 at 11:00 am to establish medication management and therapy services, your team will pick you up from your home and transport you to your appt. Contact information: [address] [place] [address] [phone]                 Plan Of Care/Follow-up recommendations:   Activity: as tolerated   Diet: heart healthy   Other: -Follow-up with your outpatient psychiatric provider -instructions on appointment date, time, and address (location) are provided to you in discharge paperwork.   -Take your psychiatric medications as prescribed at discharge - instructions are provided to you in the discharge paperwork   -Follow-up with outpatient primary care doctor and other specialists -for management of chronic medical disease, including: health maintenance checks   -Testing: Follow-up with outpatient provider for abnormal lab results: None    -Recommend abstinence from alcohol, tobacco, and other illicit drug use at discharge.    -If your psychiatric symptoms recur, worsen, or if you have side effects to your psychiatric medications, call your outpatient psychiatric provider, 911, [phone] or go to the nearest emergency department.   -If suicidal thoughts recur, call your outpatient psychiatric provider, 911, [phone] or go to the nearest emergency  department.  Signed: Peterson Ao, MD 11/21/2023, 2:13 PM

## 2023-11-21 NOTE — Progress Notes (Signed)
   11/20/23 2100  Psych Admission Type (Psych Patients Only)  Admission Status Involuntary  Psychosocial Assessment  Patient Complaints None  Eye Contact Fair  Facial Expression Flat;Pensive  Affect Flat  Speech Logical/coherent  Interaction Assertive  Motor Activity Slow  Appearance/Hygiene Unremarkable  Behavior Characteristics Cooperative  Mood Preoccupied  Thought Process  Coherency WDL  Content WDL  Delusions None reported or observed  Perception WDL  Hallucination None reported or observed  Judgment WDL  Confusion None  Danger to Self  Current suicidal ideation? Denies  Self-Injurious Behavior No self-injurious ideation or behavior indicators observed or expressed   Agreement Not to Harm Self Yes  Description of Agreement Verbal  Danger to Others  Danger to Others None reported or observed

## 2023-11-26 DIAGNOSIS — F312 Bipolar disorder, current episode manic severe with psychotic features: Secondary | ICD-10-CM | POA: Diagnosis not present

## 2023-11-26 DIAGNOSIS — F411 Generalized anxiety disorder: Secondary | ICD-10-CM | POA: Diagnosis not present

## 2023-12-14 DIAGNOSIS — F411 Generalized anxiety disorder: Secondary | ICD-10-CM | POA: Diagnosis not present

## 2023-12-14 DIAGNOSIS — F312 Bipolar disorder, current episode manic severe with psychotic features: Secondary | ICD-10-CM | POA: Diagnosis not present

## 2023-12-16 DIAGNOSIS — F312 Bipolar disorder, current episode manic severe with psychotic features: Secondary | ICD-10-CM | POA: Diagnosis not present

## 2023-12-16 DIAGNOSIS — F411 Generalized anxiety disorder: Secondary | ICD-10-CM | POA: Diagnosis not present

## 2023-12-23 DIAGNOSIS — F411 Generalized anxiety disorder: Secondary | ICD-10-CM | POA: Diagnosis not present

## 2023-12-23 DIAGNOSIS — F312 Bipolar disorder, current episode manic severe with psychotic features: Secondary | ICD-10-CM | POA: Diagnosis not present

## 2023-12-24 DIAGNOSIS — F411 Generalized anxiety disorder: Secondary | ICD-10-CM | POA: Diagnosis not present

## 2023-12-24 DIAGNOSIS — F312 Bipolar disorder, current episode manic severe with psychotic features: Secondary | ICD-10-CM | POA: Diagnosis not present

## 2023-12-25 DIAGNOSIS — F312 Bipolar disorder, current episode manic severe with psychotic features: Secondary | ICD-10-CM | POA: Diagnosis not present

## 2023-12-25 DIAGNOSIS — F411 Generalized anxiety disorder: Secondary | ICD-10-CM | POA: Diagnosis not present

## 2023-12-28 DIAGNOSIS — F411 Generalized anxiety disorder: Secondary | ICD-10-CM | POA: Diagnosis not present

## 2023-12-28 DIAGNOSIS — F312 Bipolar disorder, current episode manic severe with psychotic features: Secondary | ICD-10-CM | POA: Diagnosis not present

## 2023-12-30 DIAGNOSIS — F312 Bipolar disorder, current episode manic severe with psychotic features: Secondary | ICD-10-CM | POA: Diagnosis not present

## 2023-12-30 DIAGNOSIS — F411 Generalized anxiety disorder: Secondary | ICD-10-CM | POA: Diagnosis not present

## 2023-12-31 DIAGNOSIS — F411 Generalized anxiety disorder: Secondary | ICD-10-CM | POA: Diagnosis not present

## 2023-12-31 DIAGNOSIS — F312 Bipolar disorder, current episode manic severe with psychotic features: Secondary | ICD-10-CM | POA: Diagnosis not present

## 2024-01-01 DIAGNOSIS — F312 Bipolar disorder, current episode manic severe with psychotic features: Secondary | ICD-10-CM | POA: Diagnosis not present

## 2024-01-01 DIAGNOSIS — F411 Generalized anxiety disorder: Secondary | ICD-10-CM | POA: Diagnosis not present

## 2024-01-04 DIAGNOSIS — F411 Generalized anxiety disorder: Secondary | ICD-10-CM | POA: Diagnosis not present

## 2024-01-04 DIAGNOSIS — F312 Bipolar disorder, current episode manic severe with psychotic features: Secondary | ICD-10-CM | POA: Diagnosis not present

## 2024-01-06 DIAGNOSIS — F411 Generalized anxiety disorder: Secondary | ICD-10-CM | POA: Diagnosis not present

## 2024-01-06 DIAGNOSIS — F312 Bipolar disorder, current episode manic severe with psychotic features: Secondary | ICD-10-CM | POA: Diagnosis not present

## 2024-01-07 DIAGNOSIS — F411 Generalized anxiety disorder: Secondary | ICD-10-CM | POA: Diagnosis not present

## 2024-01-07 DIAGNOSIS — F312 Bipolar disorder, current episode manic severe with psychotic features: Secondary | ICD-10-CM | POA: Diagnosis not present

## 2024-01-08 DIAGNOSIS — F312 Bipolar disorder, current episode manic severe with psychotic features: Secondary | ICD-10-CM | POA: Diagnosis not present

## 2024-01-08 DIAGNOSIS — F411 Generalized anxiety disorder: Secondary | ICD-10-CM | POA: Diagnosis not present

## 2024-01-11 DIAGNOSIS — F312 Bipolar disorder, current episode manic severe with psychotic features: Secondary | ICD-10-CM | POA: Diagnosis not present

## 2024-01-11 DIAGNOSIS — F411 Generalized anxiety disorder: Secondary | ICD-10-CM | POA: Diagnosis not present

## 2024-01-13 DIAGNOSIS — F411 Generalized anxiety disorder: Secondary | ICD-10-CM | POA: Diagnosis not present

## 2024-01-13 DIAGNOSIS — F312 Bipolar disorder, current episode manic severe with psychotic features: Secondary | ICD-10-CM | POA: Diagnosis not present

## 2024-01-16 DIAGNOSIS — F411 Generalized anxiety disorder: Secondary | ICD-10-CM | POA: Diagnosis not present

## 2024-01-16 DIAGNOSIS — F312 Bipolar disorder, current episode manic severe with psychotic features: Secondary | ICD-10-CM | POA: Diagnosis not present

## 2024-01-20 DIAGNOSIS — F411 Generalized anxiety disorder: Secondary | ICD-10-CM | POA: Diagnosis not present

## 2024-01-20 DIAGNOSIS — F312 Bipolar disorder, current episode manic severe with psychotic features: Secondary | ICD-10-CM | POA: Diagnosis not present

## 2024-02-03 DIAGNOSIS — Z79891 Long term (current) use of opiate analgesic: Secondary | ICD-10-CM | POA: Diagnosis not present

## 2024-03-20 ENCOUNTER — Ambulatory Visit: Payer: BC Managed Care – PPO | Admitting: General Practice

## 2024-03-20 DIAGNOSIS — Z7689 Persons encountering health services in other specified circumstances: Secondary | ICD-10-CM

## 2024-03-23 DIAGNOSIS — F259 Schizoaffective disorder, unspecified: Secondary | ICD-10-CM | POA: Diagnosis not present

## 2024-03-23 DIAGNOSIS — Z79899 Other long term (current) drug therapy: Secondary | ICD-10-CM | POA: Diagnosis not present

## 2024-05-18 ENCOUNTER — Ambulatory Visit: Admitting: General Practice

## 2024-07-17 ENCOUNTER — Ambulatory Visit: Admitting: General Practice

## 2024-07-22 ENCOUNTER — Telehealth: Payer: Self-pay | Admitting: Nurse Practitioner

## 2024-07-22 ENCOUNTER — Ambulatory Visit (INDEPENDENT_AMBULATORY_CARE_PROVIDER_SITE_OTHER): Admitting: Nurse Practitioner

## 2024-07-22 ENCOUNTER — Ambulatory Visit (INDEPENDENT_AMBULATORY_CARE_PROVIDER_SITE_OTHER)
Admission: RE | Admit: 2024-07-22 | Discharge: 2024-07-22 | Disposition: A | Discharge status: Home / Self Care | Source: Ambulatory Visit | Attending: Nurse Practitioner | Admitting: Nurse Practitioner

## 2024-07-22 ENCOUNTER — Encounter: Payer: Self-pay | Admitting: Nurse Practitioner

## 2024-07-22 VITALS — BP 112/80 | HR 65 | Temp 98.1°F | Ht 71.75 in | Wt 181.6 lb

## 2024-07-22 DIAGNOSIS — Z114 Encounter for screening for human immunodeficiency virus [HIV]: Secondary | ICD-10-CM

## 2024-07-22 DIAGNOSIS — Z13220 Encounter for screening for lipoid disorders: Secondary | ICD-10-CM | POA: Diagnosis not present

## 2024-07-22 DIAGNOSIS — F250 Schizoaffective disorder, bipolar type: Secondary | ICD-10-CM | POA: Diagnosis not present

## 2024-07-22 DIAGNOSIS — Z131 Encounter for screening for diabetes mellitus: Secondary | ICD-10-CM

## 2024-07-22 DIAGNOSIS — R0781 Pleurodynia: Secondary | ICD-10-CM | POA: Diagnosis not present

## 2024-07-22 DIAGNOSIS — R079 Chest pain, unspecified: Secondary | ICD-10-CM | POA: Diagnosis not present

## 2024-07-22 DIAGNOSIS — Z23 Encounter for immunization: Secondary | ICD-10-CM

## 2024-07-22 DIAGNOSIS — Z0000 Encounter for general adult medical examination without abnormal findings: Secondary | ICD-10-CM | POA: Diagnosis not present

## 2024-07-22 DIAGNOSIS — F319 Bipolar disorder, unspecified: Secondary | ICD-10-CM

## 2024-07-22 LAB — COMPREHENSIVE METABOLIC PANEL WITH GFR
ALT: 9 U/L (ref 0–53)
AST: 14 U/L (ref 0–37)
Albumin: 4.5 g/dL (ref 3.5–5.2)
Alkaline Phosphatase: 66 U/L (ref 39–117)
BUN: 11 mg/dL (ref 6–23)
CO2: 30 meq/L (ref 19–32)
Calcium: 9.5 mg/dL (ref 8.4–10.5)
Chloride: 103 meq/L (ref 96–112)
Creatinine, Ser: 0.76 mg/dL (ref 0.40–1.50)
GFR: 126.36 mL/min (ref >60.00)
Glucose, Bld: 85 mg/dL (ref 70–99)
Potassium: 4.3 meq/L (ref 3.5–5.1)
Sodium: 138 meq/L (ref 135–145)
Total Bilirubin: 0.5 mg/dL (ref 0.2–1.2)
Total Protein: 7 g/dL (ref 6.0–8.3)

## 2024-07-22 LAB — CBC WITH DIFFERENTIAL/PLATELET
Basophils Absolute: 0.1 K/uL (ref 0.0–0.1)
Basophils Relative: 1.1 % (ref 0.0–3.0)
Eosinophils Absolute: 0.2 K/uL (ref 0.0–0.7)
Eosinophils Relative: 4.3 % (ref 0.0–5.0)
HCT: 45 % (ref 39.0–52.0)
Hemoglobin: 14.7 g/dL (ref 13.0–17.0)
Lymphocytes Relative: 24.7 % (ref 12.0–46.0)
Lymphs Abs: 1.2 K/uL (ref 0.7–4.0)
MCHC: 32.7 g/dL (ref 30.0–36.0)
MCV: 90.8 fl (ref 78.0–100.0)
Monocytes Absolute: 0.4 K/uL (ref 0.1–1.0)
Monocytes Relative: 8.1 % (ref 3.0–12.0)
Neutro Abs: 3.1 K/uL (ref 1.4–7.7)
Neutrophils Relative %: 61.8 % (ref 43.0–77.0)
Platelets: 175 K/uL (ref 150.0–400.0)
RBC: 4.95 Mil/uL (ref 4.22–5.81)
RDW: 13.6 % (ref 11.5–15.5)
WBC: 5 K/uL (ref 4.0–10.5)

## 2024-07-22 LAB — LIPID PANEL
Cholesterol: 118 mg/dL (ref 0–200)
HDL: 41.7 mg/dL (ref >39.00)
LDL Cholesterol: 66 mg/dL (ref 0–99)
NonHDL: 76.71
Total CHOL/HDL Ratio: 3
Triglycerides: 55 mg/dL (ref 0.0–149.0)
VLDL: 11 mg/dL (ref 0.0–40.0)

## 2024-07-22 LAB — TSH: TSH: 4.2 u[IU]/mL (ref 0.35–5.50)

## 2024-07-22 LAB — HEMOGLOBIN A1C: Hgb A1c MFr Bld: 5 % (ref 4.6–6.5)

## 2024-07-22 LAB — SEDIMENTATION RATE: Sed Rate: 3 mm/h (ref 0–15)

## 2024-07-22 NOTE — Assessment & Plan Note (Signed)
 Discussed age-appropriate immunizations and screening exams.  Did review patient's personal, surgical, social, family histories.  Patient up-to-date on all age-appropriate vaccinations he would like.  Update tetanus vaccine today.  Patient is too young for CRC screening or prostate cancer screening.  Patient declined STI screen.  Patient was given information at discharge about preventative healthcare maintenance with anticipatory guidance

## 2024-07-22 NOTE — Assessment & Plan Note (Signed)
 Ambiguous in nature.  Will obtain chest x-ray, electrolytes, sed rate

## 2024-07-22 NOTE — Telephone Encounter (Signed)
 Can we call patient's psychiatry office Dr. Annye in [place] and I have no patient endorses SI approximately.  None currently does wax and wane between passive and active SI patient has contracted to safety patient's father was present in the room patient states he does talk to his father if these thoughts are present they will seek emergent health care if needed in the interim.

## 2024-07-22 NOTE — Assessment & Plan Note (Signed)
 History of same 2/2.  Maintained on Cogentin , Haldol , lithium.  Followed by psychiatry.  Continue

## 2024-07-22 NOTE — Assessment & Plan Note (Signed)
 Patient currently followed by psychiatry maintained on Cogentin , lithium, Haldol .  Continue following with specialist as recommended patient does have SI and hallucinations.  He did contract for safety.  Will reach out to patient's psychiatrist to make sure he is aware.  Patient does have good supportive environment and lives with his father and will talk to him when these thoughts occur.  They will seek emergent health care if needed in the interim.  Patient does have a history of suicidal attempt with overdose

## 2024-07-22 NOTE — Patient Instructions (Signed)
 Nice to see you today I will be in touch with the labs once I have them Follow up with me in 1 year, sooner if you need me

## 2024-07-22 NOTE — Progress Notes (Signed)
 New Patient Office Visit  Subjective    Patient ID: Mario Thomas, male    DOB: Jul 24, 2001  Age: 23 y.o. MRN: [id number]  CC:  Chief Complaint  Patient presents with   Establish Care    Requests for TSH levels to be checked.     HPI Mario Thomas presents to establish care  for complete physical and follow up of chronic conditions.  Schizo: Dr. Moussa. Sees them monthly.  Currently maintained on Cogentin , lithium, Haldol .  Recently seen him 1 week ago  Immunizations: -Tetanus: Completed in 2013, update today -Influenza: out of season  -Shingles: too young  -Pneumonia: too young  -HPV: UTD -covid: original series   Diet: Fair diet. He is eating 3 meals a day and snacks. He does soda and water   Exercise: Occassionaly. He will run 2-3 times a week. He will walk on treadmill up to 2 hours   Eye exam: Completes annually  Dental exam: Needs updating    Colonoscopy: Too young, currently average risk Lung Cancer Screening: N/A   PSA: Too young, currently average risk  STI: Declines testing  Sleep: goes to bed aroun 8 and will get up around 7-8. Does feel rested and does snore intermittent  SI: states that he does have history of SI. States that he has harmed  himself in the past with a medicaoitn overdose.  He does have hallcuinations. States that the voices that say Hi, hey and his names. He does have visual hallucincations that are of peoles faces that will change. More with driving     Right rib pian: states that it has been for years. States that it intermittent. States that he has had an xray in the past. That was a year   Outpatient Encounter Medications as of 07/22/2024  Medication Sig   benztropine  (COGENTIN ) 2 MG tablet Take 1 tablet (2 mg total) by mouth daily.   busPIRone (BUSPAR) 15 MG tablet Take 15 mg by mouth 3 (three) times daily.   haloperidol  (HALDOL ) 5 MG tablet Take 5 mg by mouth 2 (two) times daily.   lithium 300 MG tablet Take by mouth.   [DISCONTINUED]  hydrOXYzine  (ATARAX ) 25 MG tablet Take 1 tablet (25 mg total) by mouth 3 (three) times daily as needed for anxiety.   [DISCONTINUED] melatonin 3 MG TABS tablet Take 1 tablet (3 mg total) by mouth at bedtime.   [DISCONTINUED] divalproex  (DEPAKOTE ) 500 MG DR tablet Take 1 tablet (500 mg total) by mouth every 12 (twelve) hours.   [DISCONTINUED] haloperidol  decanoate (HALDOL  DECANOATE) 100 MG/ML injection Inject 1.5 mLs (150 mg total) into the muscle every 28 (twenty-eight) days.   [DISCONTINUED] traZODone  (DESYREL ) 50 MG tablet Take 1 tablet (50 mg total) by mouth at bedtime as needed for sleep.   No facility-administered encounter medications on file as of 07/22/2024.    Past Medical History:  Diagnosis Date   Heart murmur    MDD (major depressive disorder), severe (HCC) 08/23/2018   Schizophrenia (HCC)     No past surgical history on file.  Family History  Problem Relation Age of Onset   Hyperlipidemia Father     Social History   Socioeconomic History   Marital status: Single    Spouse name: Not on file   Number of children: Not on file   Years of education: Not on file   Highest education level: Not on file  Occupational History   Not on file  Tobacco Use   Smoking status: Never  Smokeless tobacco: Never  Vaping Use   Vaping status: Never Used  Substance and Sexual Activity   Alcohol use: Not Currently   Drug use: Never    Comment: Denies   Sexual activity: Never  Other Topics Concern   Not on file  Social History Narrative   01/23/21   From: the area   Living: with parents   Work:[employer]          Family: good relationship with parents, 1 sibling      Enjoys: running      Exercise: daily running    Diet: meat, veggies, grains      Safety   Seat belts: Yes    Guns: No   Safe in relationships: Yes    Social Drivers of Corporate investment banker Strain: Not on file  Food Insecurity: No Food Insecurity (11/10/2023)   Hunger Vital Sign    Worried About  Running Out of Food in the Last Year: Never true    Ran Out of Food in the Last Year: Never true  Recent Concern: Food Insecurity - Food Insecurity Present (10/24/2023)   Hunger Vital Sign    Worried About Running Out of Food in the Last Year: Sometimes true    Ran Out of Food in the Last Year: Sometimes true  Transportation Needs: No Transportation Needs (11/10/2023)   PRAPARE - Administrator, Civil Service (Medical): No    Lack of Transportation (Non-Medical): No  Physical Activity: Not on file  Stress: Not on file  Social Connections: Not on file  Intimate Partner Violence: Not At Risk (11/10/2023)   Humiliation, Afraid, Rape, and Kick questionnaire    Fear of Current or Ex-Partner: No    Emotionally Abused: No    Physically Abused: No    Sexually Abused: No    Review of Systems  Constitutional:  Negative for chills and fever.  Respiratory:  Negative for shortness of breath.   Cardiovascular:  Negative for chest pain and leg swelling.  Gastrointestinal:  Negative for abdominal pain, blood in stool, constipation, diarrhea, nausea and vomiting.       BM every other day   Genitourinary:  Negative for dysuria and hematuria.  Neurological:  Negative for dizziness, tingling and headaches.  Psychiatric/Behavioral:  Positive for suicidal ideas (every other day.). Negative for hallucinations.         Objective    BP 112/80   Pulse 65   Temp 98.1 F (36.7 C) (Oral)   Ht 5' 11.75 (1.822 m)   Wt 181 lb 9.6 oz (82.4 kg)   SpO2 98%   BMI 24.80 kg/m   Physical Exam Vitals and nursing note reviewed.  Constitutional:      Appearance: Normal appearance.  HENT:     Right Ear: Tympanic membrane, ear canal and external ear normal.     Left Ear: Tympanic membrane, ear canal and external ear normal.     Mouth/Throat:     Mouth: Mucous membranes are moist.     Pharynx: Oropharynx is clear.  Eyes:     Extraocular Movements: Extraocular movements intact.     Pupils:  Pupils are equal, round, and reactive to light.  Cardiovascular:     Rate and Rhythm: Normal rate and regular rhythm.     Pulses: Normal pulses.     Heart sounds: Normal heart sounds.  Pulmonary:     Effort: Pulmonary effort is normal.     Breath sounds: Normal breath sounds.  Chest:     Chest wall: No tenderness.  Abdominal:     General: Bowel sounds are normal. There is no distension.     Palpations: There is no mass.     Tenderness: There is no abdominal tenderness.     Hernia: No hernia is present.  Genitourinary:    Comments: deferred Musculoskeletal:     Right lower leg: No edema.     Left lower leg: No edema.  Lymphadenopathy:     Cervical: No cervical adenopathy.  Skin:    General: Skin is warm.  Neurological:     General: No focal deficit present.     Mental Status: He is alert.     Deep Tendon Reflexes:     Reflex Scores:      Bicep reflexes are 2+ on the right side and 2+ on the left side.      Patellar reflexes are 2+ on the right side and 2+ on the left side.    Comments: Bilateral upper and lower extremity strength 5/5  Psychiatric:        Mood and Affect: Mood normal.        Behavior: Behavior normal.        Thought Content: Thought content normal.        Judgment: Judgment normal.         Assessment & Plan:   Problem List Items Addressed This Visit       Other   Schizoaffective disorder (HCC) (Chronic)   Patient currently followed by psychiatry maintained on Cogentin , lithium, Haldol .  Continue following with specialist as recommended patient does have SI and hallucinations.  He did contract for safety.  Will reach out to patient's psychiatrist to make sure he is aware.  Patient does have good supportive environment and lives with his father and will talk to him when these thoughts occur.  They will seek emergent health care if needed in the interim.  Patient does have a history of suicidal attempt with overdose      Bipolar disorder with psychotic  features (HCC)   History of same 2/2.  Maintained on Cogentin , Haldol , lithium.  Followed by psychiatry.  Continue      Preventative health care - Primary   Discussed age-appropriate immunizations and screening exams.  Did review patient's personal, surgical, social, family histories.  Patient up-to-date on all age-appropriate vaccinations he would like.  Update tetanus vaccine today.  Patient is too young for CRC screening or prostate cancer screening.  Patient declined STI screen.  Patient was given information at discharge about preventative healthcare maintenance with anticipatory guidance      Relevant Orders   CBC with Differential/Platelet   Comprehensive metabolic panel with GFR   TSH   Right-sided chest pain   Ambiguous in nature.  Will obtain chest x-ray, electrolytes, sed rate      Relevant Orders   Sedimentation rate   DG Ribs Unilateral W/Chest Right   Other Visit Diagnoses       Encounter for screening for HIV       Relevant Orders   HIV antibody (with reflex)     Screening for lipid disorders       Relevant Orders   Lipid panel     Screening for diabetes mellitus       Relevant Orders   Hemoglobin A1c     Need for tetanus booster       Relevant Orders   Tdap vaccine greater than or equal  to 7yo IM (Completed)       Return in about 1 year (around 07/22/2025) for CPE and Labs.   Adina Crandall, NP

## 2024-07-23 LAB — HIV ANTIBODY (ROUTINE TESTING W REFLEX): HIV 1&2 Ab, 4th Generation: NONREACTIVE

## 2024-07-23 NOTE — Telephone Encounter (Signed)
 Can we call the pharmacy to see who has been prescribing his psych meds please

## 2024-07-23 NOTE — Telephone Encounter (Signed)
 Called office at [phone]. Receptionist sates that patient has not been seen in office in over 2 years.

## 2024-07-24 ENCOUNTER — Ambulatory Visit: Payer: Self-pay | Admitting: Nurse Practitioner

## 2024-07-24 NOTE — Telephone Encounter (Signed)
 Called pharmacy listed on file provided with following information,  Haladol was last filled for 30 day supply on 11/27/23 and was written by  Dr.Suryn Challa phone number [phone] Lithium Musa Akintayo given 30 day on 02/05/24 then all refills on that script were cancelled by office.

## 2024-07-24 NOTE — Telephone Encounter (Signed)
 Patient and father told me they were just seen by psych. We need to call the patient and see who he is seeing as he is not seeing the provider they told me they were

## 2024-07-24 NOTE — Telephone Encounter (Signed)
 Called pt.  Pt answered and partly verified DOB before hanging up. Called pt again and went straight to voicemail. Left a message for pt to call office back.

## 2025-07-26 ENCOUNTER — Encounter: Admitting: Nurse Practitioner
